# Patient Record
Sex: Female | Born: 1955
Health system: Southern US, Community
[De-identification: ages and names within clinical notes are randomized; demographics above are authoritative.]

## PROBLEM LIST (undated history)

## (undated) DIAGNOSIS — R011 Cardiac murmur, unspecified: Secondary | ICD-10-CM

## (undated) DIAGNOSIS — R1310 Dysphagia, unspecified: Secondary | ICD-10-CM

## (undated) DIAGNOSIS — I10 Essential (primary) hypertension: Secondary | ICD-10-CM

## (undated) DIAGNOSIS — Z8541 Personal history of malignant neoplasm of cervix uteri: Secondary | ICD-10-CM

## (undated) DIAGNOSIS — R21 Rash and other nonspecific skin eruption: Secondary | ICD-10-CM

## (undated) DIAGNOSIS — R7303 Prediabetes: Secondary | ICD-10-CM

## (undated) DIAGNOSIS — R600 Localized edema: Secondary | ICD-10-CM

## (undated) DIAGNOSIS — F419 Anxiety disorder, unspecified: Secondary | ICD-10-CM

## (undated) DIAGNOSIS — G629 Polyneuropathy, unspecified: Secondary | ICD-10-CM

## (undated) DIAGNOSIS — E119 Type 2 diabetes mellitus without complications: Secondary | ICD-10-CM

## (undated) DIAGNOSIS — R609 Edema, unspecified: Secondary | ICD-10-CM

## (undated) DIAGNOSIS — G609 Hereditary and idiopathic neuropathy, unspecified: Secondary | ICD-10-CM

## (undated) DIAGNOSIS — K2981 Duodenitis with bleeding: Secondary | ICD-10-CM

## (undated) DIAGNOSIS — Z8742 Personal history of other diseases of the female genital tract: Secondary | ICD-10-CM

## (undated) DIAGNOSIS — F410 Panic disorder [episodic paroxysmal anxiety] without agoraphobia: Secondary | ICD-10-CM

## (undated) DIAGNOSIS — E559 Vitamin D deficiency, unspecified: Secondary | ICD-10-CM

## (undated) DIAGNOSIS — R1013 Epigastric pain: Secondary | ICD-10-CM

## (undated) DIAGNOSIS — M8580 Other specified disorders of bone density and structure, unspecified site: Secondary | ICD-10-CM

## (undated) DIAGNOSIS — K219 Gastro-esophageal reflux disease without esophagitis: Secondary | ICD-10-CM

## (undated) DIAGNOSIS — M549 Dorsalgia, unspecified: Secondary | ICD-10-CM

## (undated) DIAGNOSIS — K2100 Gastro-esophageal reflux disease with esophagitis, without bleeding: Secondary | ICD-10-CM

## (undated) DIAGNOSIS — G4730 Sleep apnea, unspecified: Secondary | ICD-10-CM

## (undated) DIAGNOSIS — E669 Obesity, unspecified: Secondary | ICD-10-CM

## (undated) DIAGNOSIS — E785 Hyperlipidemia, unspecified: Secondary | ICD-10-CM

## (undated) DIAGNOSIS — K5730 Diverticulosis of large intestine without perforation or abscess without bleeding: Secondary | ICD-10-CM

## (undated) DIAGNOSIS — I340 Nonrheumatic mitral (valve) insufficiency: Secondary | ICD-10-CM

## (undated) HISTORY — DX: Gastro-esophageal reflux disease with esophagitis: K21.0

## (undated) HISTORY — DX: Localized edema: R60.0

## (undated) HISTORY — DX: Edema, unspecified: R60.9

## (undated) HISTORY — DX: Dysphagia, unspecified: R13.10

## (undated) HISTORY — DX: Type 2 diabetes mellitus without complications: E11.9

## (undated) HISTORY — DX: Hyperlipidemia, unspecified: E78.5

## (undated) HISTORY — DX: Rash and other nonspecific skin eruption: R21

## (undated) HISTORY — DX: Hereditary and idiopathic neuropathy, unspecified: G60.9

## (undated) HISTORY — DX: Gastro-esophageal reflux disease without esophagitis: K21.9

## (undated) HISTORY — DX: Nonrheumatic mitral (valve) insufficiency: I34.0

## (undated) HISTORY — DX: Anxiety disorder, unspecified: F41.9

## (undated) HISTORY — DX: Duodenitis with bleeding: K29.81

## (undated) HISTORY — DX: Diverticulosis of large intestine without perforation or abscess without bleeding: K57.30

## (undated) HISTORY — DX: Other specified disorders of bone density and structure, unspecified site: M85.80

## (undated) HISTORY — DX: Essential (primary) hypertension: I10

## (undated) HISTORY — DX: Prediabetes: R73.03

## (undated) HISTORY — DX: Panic disorder (episodic paroxysmal anxiety): F41.0

## (undated) HISTORY — DX: Sleep apnea, unspecified: G47.30

## (undated) HISTORY — DX: Vitamin D deficiency, unspecified: E55.9

## (undated) HISTORY — DX: Personal history of other diseases of the female genital tract: Z87.42

## (undated) HISTORY — DX: Epigastric pain: R10.13

## (undated) HISTORY — DX: Cardiac murmur, unspecified: R01.1

## (undated) HISTORY — DX: Gastro-esophageal reflux disease with esophagitis, without bleeding: K21.00

## (undated) HISTORY — DX: Polyneuropathy, unspecified: G62.9

## (undated) HISTORY — PX: BREAST CYST ASPIRATION: SHX578

## (undated) HISTORY — DX: Obesity, unspecified: E66.9

## (undated) HISTORY — PX: DILATION AND CURETTAGE OF UTERUS: SHX78

## (undated) HISTORY — DX: Dorsalgia, unspecified: M54.9

## (undated) HISTORY — DX: Personal history of malignant neoplasm of cervix uteri: Z85.41

## (undated) HISTORY — PX: ABDOMINAL HYSTERECTOMY: SHX81

---

## 1999-03-06 ENCOUNTER — Ambulatory Visit (HOSPITAL_COMMUNITY): Admission: RE | Admit: 1999-03-06 | Discharge: 1999-03-06 | Payer: Self-pay | Admitting: Obstetrics & Gynecology

## 1999-07-04 ENCOUNTER — Inpatient Hospital Stay (HOSPITAL_COMMUNITY): Admission: RE | Admit: 1999-07-04 | Discharge: 1999-07-06 | Payer: Self-pay | Admitting: Obstetrics & Gynecology

## 1999-08-14 ENCOUNTER — Encounter: Admission: RE | Admit: 1999-08-14 | Discharge: 1999-08-14 | Payer: Self-pay | Admitting: *Deleted

## 1999-08-14 ENCOUNTER — Encounter: Payer: Self-pay | Admitting: *Deleted

## 1999-09-21 HISTORY — PX: RIGHT OOPHORECTOMY: SHX2359

## 1999-10-19 ENCOUNTER — Other Ambulatory Visit: Admission: RE | Admit: 1999-10-19 | Discharge: 1999-10-19 | Payer: Self-pay | Admitting: Obstetrics and Gynecology

## 2000-05-16 ENCOUNTER — Encounter: Payer: Self-pay | Admitting: Obstetrics and Gynecology

## 2000-05-16 ENCOUNTER — Ambulatory Visit (HOSPITAL_COMMUNITY): Admission: RE | Admit: 2000-05-16 | Discharge: 2000-05-16 | Payer: Self-pay | Admitting: Obstetrics and Gynecology

## 2000-07-11 ENCOUNTER — Encounter: Payer: Self-pay | Admitting: Obstetrics and Gynecology

## 2000-07-11 ENCOUNTER — Ambulatory Visit (HOSPITAL_COMMUNITY): Admission: RE | Admit: 2000-07-11 | Discharge: 2000-07-11 | Payer: Self-pay | Admitting: Obstetrics and Gynecology

## 2000-10-20 ENCOUNTER — Encounter: Payer: Self-pay | Admitting: Obstetrics and Gynecology

## 2000-10-20 ENCOUNTER — Encounter: Admission: RE | Admit: 2000-10-20 | Discharge: 2000-10-20 | Payer: Self-pay | Admitting: Obstetrics and Gynecology

## 2000-11-09 ENCOUNTER — Ambulatory Visit (HOSPITAL_COMMUNITY): Admission: RE | Admit: 2000-11-09 | Discharge: 2000-11-09 | Payer: Self-pay | Admitting: Obstetrics and Gynecology

## 2000-11-09 ENCOUNTER — Encounter: Payer: Self-pay | Admitting: Obstetrics and Gynecology

## 2000-12-15 ENCOUNTER — Other Ambulatory Visit: Admission: RE | Admit: 2000-12-15 | Discharge: 2000-12-15 | Payer: Self-pay | Admitting: Obstetrics and Gynecology

## 2001-12-13 ENCOUNTER — Encounter: Payer: Self-pay | Admitting: Obstetrics and Gynecology

## 2001-12-13 ENCOUNTER — Encounter: Admission: RE | Admit: 2001-12-13 | Discharge: 2001-12-13 | Payer: Self-pay | Admitting: Obstetrics and Gynecology

## 2001-12-27 ENCOUNTER — Other Ambulatory Visit: Admission: RE | Admit: 2001-12-27 | Discharge: 2001-12-27 | Payer: Self-pay | Admitting: Obstetrics and Gynecology

## 2002-12-31 ENCOUNTER — Other Ambulatory Visit: Admission: RE | Admit: 2002-12-31 | Discharge: 2002-12-31 | Payer: Self-pay | Admitting: Obstetrics and Gynecology

## 2003-01-04 ENCOUNTER — Encounter: Admission: RE | Admit: 2003-01-04 | Discharge: 2003-01-04 | Payer: Self-pay | Admitting: Obstetrics and Gynecology

## 2003-01-04 ENCOUNTER — Encounter: Payer: Self-pay | Admitting: Obstetrics and Gynecology

## 2004-02-06 ENCOUNTER — Other Ambulatory Visit: Payer: Self-pay

## 2004-08-31 ENCOUNTER — Emergency Department: Payer: Self-pay | Admitting: Internal Medicine

## 2004-09-22 ENCOUNTER — Encounter: Payer: Self-pay | Admitting: Orthopedic Surgery

## 2004-10-21 ENCOUNTER — Encounter: Payer: Self-pay | Admitting: Orthopedic Surgery

## 2005-02-11 ENCOUNTER — Ambulatory Visit: Payer: Self-pay | Admitting: Internal Medicine

## 2005-02-23 ENCOUNTER — Ambulatory Visit: Payer: Self-pay | Admitting: Internal Medicine

## 2005-05-20 ENCOUNTER — Ambulatory Visit: Payer: Self-pay | Admitting: Unknown Physician Specialty

## 2005-12-04 ENCOUNTER — Emergency Department: Payer: Self-pay | Admitting: Emergency Medicine

## 2006-04-18 LAB — HM COLONOSCOPY

## 2006-05-31 ENCOUNTER — Ambulatory Visit: Payer: Self-pay | Admitting: Unknown Physician Specialty

## 2006-08-26 ENCOUNTER — Ambulatory Visit: Payer: Self-pay | Admitting: Internal Medicine

## 2006-09-15 ENCOUNTER — Ambulatory Visit: Payer: Self-pay | Admitting: Internal Medicine

## 2006-11-28 ENCOUNTER — Ambulatory Visit: Payer: Self-pay | Admitting: *Deleted

## 2007-06-14 ENCOUNTER — Ambulatory Visit: Payer: Self-pay | Admitting: Internal Medicine

## 2007-06-15 ENCOUNTER — Ambulatory Visit: Payer: Self-pay

## 2007-07-03 ENCOUNTER — Ambulatory Visit: Payer: Self-pay | Admitting: Internal Medicine

## 2007-08-05 ENCOUNTER — Ambulatory Visit: Payer: Self-pay | Admitting: Cardiovascular Disease

## 2008-01-23 ENCOUNTER — Ambulatory Visit: Payer: Self-pay | Admitting: Internal Medicine

## 2008-03-05 ENCOUNTER — Ambulatory Visit: Payer: Self-pay | Admitting: Internal Medicine

## 2008-06-09 ENCOUNTER — Emergency Department: Payer: Self-pay | Admitting: Unknown Physician Specialty

## 2008-07-03 ENCOUNTER — Ambulatory Visit: Payer: Self-pay | Admitting: Unknown Physician Specialty

## 2008-07-08 ENCOUNTER — Ambulatory Visit: Payer: Self-pay | Admitting: Internal Medicine

## 2008-08-02 ENCOUNTER — Ambulatory Visit: Payer: Self-pay | Admitting: Gastroenterology

## 2008-08-12 ENCOUNTER — Ambulatory Visit: Payer: Self-pay | Admitting: Internal Medicine

## 2008-08-22 ENCOUNTER — Ambulatory Visit: Payer: Self-pay | Admitting: Gastroenterology

## 2008-12-02 ENCOUNTER — Ambulatory Visit: Payer: Self-pay | Admitting: Internal Medicine

## 2009-05-27 ENCOUNTER — Ambulatory Visit: Payer: Self-pay | Admitting: Internal Medicine

## 2009-06-02 ENCOUNTER — Ambulatory Visit: Payer: Self-pay | Admitting: Internal Medicine

## 2009-06-20 ENCOUNTER — Ambulatory Visit: Payer: Self-pay

## 2009-12-24 ENCOUNTER — Ambulatory Visit: Payer: Self-pay | Admitting: Internal Medicine

## 2010-06-22 ENCOUNTER — Ambulatory Visit: Payer: Self-pay | Admitting: Unknown Physician Specialty

## 2011-05-07 ENCOUNTER — Encounter: Payer: Self-pay | Admitting: Internal Medicine

## 2011-05-17 ENCOUNTER — Encounter: Payer: Self-pay | Admitting: Internal Medicine

## 2011-05-17 ENCOUNTER — Ambulatory Visit (INDEPENDENT_AMBULATORY_CARE_PROVIDER_SITE_OTHER): Payer: PRIVATE HEALTH INSURANCE | Admitting: Internal Medicine

## 2011-05-17 VITALS — BP 104/80 | HR 70 | Temp 98.3°F | Resp 12 | Ht 62.5 in | Wt 196.8 lb

## 2011-05-17 DIAGNOSIS — R519 Headache, unspecified: Secondary | ICD-10-CM

## 2011-05-17 DIAGNOSIS — F5104 Psychophysiologic insomnia: Secondary | ICD-10-CM | POA: Insufficient documentation

## 2011-05-17 DIAGNOSIS — R51 Headache: Secondary | ICD-10-CM

## 2011-05-17 DIAGNOSIS — G4700 Insomnia, unspecified: Secondary | ICD-10-CM

## 2011-05-17 MED ORDER — DIAZEPAM 2 MG PO TABS: 5.0000 mg | ORAL_TABLET | Freq: Every day | ORAL | Status: DC

## 2011-05-17 NOTE — Patient Instructions (Signed)
Please take the valium 1 hour before bedtime.  Limit your after work responsibilities to three times weekly so you can rest and exercise. Call me in two weeks if your headaches do not improve.

## 2011-05-17 NOTE — Assessment & Plan Note (Signed)
Secondary to untreated anxiety. Recommended restricting after work activities to 3/week to allow time for massage therapy and exercise,  And trial of valium.

## 2011-05-17 NOTE — Assessment & Plan Note (Signed)
Agree with patient that, based on history, her headaches are tension headaches aggravated by lack of sleep.  Spent 30 minutes dicussing patient's current work and familial responisibilities and her self neglect and recommended trial of valium 1 hr before bedtime to manage muscle tension and insomnia.  If no improvement 2 weeks after resolution of insomnia, will obtain imaging of brain to rule out mass.

## 2011-05-17 NOTE — Progress Notes (Signed)
Subjective:    Patient ID: Sheri [name], female    DOB: [birth year], 55 y.o.   MRN: [id number]  HPI Sheri [name] is a 55 yo female with a history of hypertension and anxiety who presents with new onset chronic daily headaches. She attributes her symptoms to tension aggravated by lack of sleep due ot increased caregiver responsibilities. 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  Her uncle died last Aug 29, 2023 in Hospice care after a slow progressive decline in health .  She has not been resting, not exercising, working full time,  Taking aunt daily to see husband.  She lies awake until 3 am. Wakes up with clenched teeth, clenched muscles. Having excessive trouble accepting assistance from others due to fear of something going wrong. She had a massage x 2 for back and neck pain which helped for a short period of time.  Headaches are bilateral temporal/frontal.  No vision changes, fevers or myalgias.    Review of Systems  Constitutional: Positive for activity change, fatigue and unexpected weight change. Negative for fever and chills.  HENT: Negative for hearing loss, ear pain, nosebleeds, congestion, sore throat, facial swelling, rhinorrhea, sneezing, mouth sores, trouble swallowing, neck pain, neck stiffness, voice change, postnasal drip, sinus pressure, tinnitus and ear discharge.   Eyes: Negative for pain, discharge, redness and visual disturbance.  Respiratory: Negative for cough, chest tightness, shortness of breath, wheezing and stridor.   Cardiovascular: Negative for chest pain, palpitations and leg swelling.  Musculoskeletal: Negative for myalgias and arthralgias.  Skin: Negative for color change and rash.  Neurological: Positive for headaches. Negative for dizziness, weakness and light-headedness.  Hematological: Negative for adenopathy.  Psychiatric/Behavioral: Positive for sleep disturbance. The patient is  nervous/anxious.        Objective:   Physical Exam  Constitutional: She is oriented to person, place, and time. She appears well-developed and well-nourished.  HENT:  Head: Normocephalic.  Eyes: EOM are normal. Pupils are equal, round, and reactive to light.  Neck: Normal range of motion. Neck supple. No thyromegaly present.  Cardiovascular: Normal rate, regular rhythm and normal heart sounds.   Pulmonary/Chest: Effort normal and breath sounds normal.  Lymphadenopathy:    She has no cervical adenopathy.  Neurological: She is alert and oriented to person, place, and time. She has normal reflexes.  Psychiatric: Her mood appears anxious.          Assessment & Plan:

## 2011-05-19 ENCOUNTER — Telehealth: Payer: Self-pay | Admitting: Internal Medicine

## 2011-05-19 ENCOUNTER — Other Ambulatory Visit: Payer: Self-pay | Admitting: Internal Medicine

## 2011-05-19 ENCOUNTER — Telehealth: Payer: Self-pay | Admitting: *Deleted

## 2011-05-19 MED ORDER — ZOLPIDEM TARTRATE 10 MG PO TABS: 10.0000 mg | ORAL_TABLET | Freq: Every evening | ORAL | Status: DC | PRN

## 2011-05-19 MED ORDER — LISINOPRIL-HYDROCHLOROTHIAZIDE 20-25 MG PO TABS: 1.0000 | ORAL_TABLET | Freq: Every day | ORAL | Status: DC

## 2011-05-19 NOTE — Telephone Encounter (Signed)
Took diazpam  Last night  It keep her up all night pt feels like skin was crawling/rbh

## 2011-05-19 NOTE — Telephone Encounter (Signed)
Spoke with patient, and sent note to Dr. Darrick Huntsman.

## 2011-05-19 NOTE — Telephone Encounter (Signed)
Yes we can try  Zolpidem 10 mg one tablet at bedtime as needed for insomnia  #30 2 refills

## 2011-05-19 NOTE — Telephone Encounter (Signed)
Patient took the diazepam for the first time last night and she never went to sleep. She said that she felt like her skin was crawling, and felt really anxious. She is asking if she can try something else.

## 2011-05-19 NOTE — Telephone Encounter (Signed)
Addended by: Jobie Quaker on: 05/19/2011 05:37 PM   Modules accepted: Orders

## 2011-05-19 NOTE — Telephone Encounter (Signed)
Patient notified- Rx sent to pharmacy. 

## 2011-05-20 LAB — HM MAMMOGRAPHY: HM Mammogram: NORMAL

## 2011-07-07 ENCOUNTER — Ambulatory Visit: Payer: Self-pay | Admitting: Unknown Physician Specialty

## 2011-07-07 ENCOUNTER — Telehealth: Payer: Self-pay | Admitting: *Deleted

## 2011-07-07 NOTE — Telephone Encounter (Signed)
Patient notified

## 2011-07-07 NOTE — Telephone Encounter (Signed)
Message copied by Jobie Quaker on Wed Jul 07, 2011  3:55 PM ------      Message from: Duncan Dull      Created: Thu Jul 01, 2011  1:34 PM      Regarding: labs       [facility].  a1c was great at 5.6 ,  LDL 132,   Everything else normal.  Will discuss at next visit.

## 2011-08-09 ENCOUNTER — Other Ambulatory Visit: Payer: Self-pay | Admitting: Internal Medicine

## 2011-08-09 MED ORDER — ZOLPIDEM TARTRATE 10 MG PO TABS: 10.0000 mg | ORAL_TABLET | Freq: Every evening | ORAL | Status: DC | PRN

## 2011-09-06 ENCOUNTER — Other Ambulatory Visit: Payer: Self-pay | Admitting: *Deleted

## 2011-09-06 MED ORDER — FUROSEMIDE 20 MG PO TABS: 20.0000 mg | ORAL_TABLET | Freq: Every day | ORAL | Status: DC

## 2011-10-12 ENCOUNTER — Other Ambulatory Visit: Payer: Self-pay | Admitting: *Deleted

## 2011-10-12 NOTE — Telephone Encounter (Signed)
Faxed request from [facility], last filled 09/08/11.

## 2011-10-14 MED ORDER — ALPRAZOLAM 0.25 MG PO TABS: ORAL_TABLET | ORAL | Status: DC

## 2011-10-14 NOTE — Telephone Encounter (Signed)
Called to [facility]. 

## 2011-10-29 ENCOUNTER — Telehealth: Payer: Self-pay | Admitting: *Deleted

## 2011-10-29 DIAGNOSIS — T753XXA Motion sickness, initial encounter: Secondary | ICD-10-CM | POA: Insufficient documentation

## 2011-10-29 MED ORDER — SCOPOLAMINE 1 MG/3DAYS TD PT72: 1.0000 | MEDICATED_PATCH | TRANSDERMAL | Status: DC

## 2011-10-29 NOTE — Telephone Encounter (Signed)
Pt is going on a trip and is asking for motion sickness patches to be sent to [facility] pharmacy.  She says one box should be enough.

## 2011-10-29 NOTE — Telephone Encounter (Signed)
Sent to [facility]

## 2011-12-13 ENCOUNTER — Other Ambulatory Visit: Payer: Self-pay | Admitting: Internal Medicine

## 2011-12-14 ENCOUNTER — Other Ambulatory Visit: Payer: Self-pay | Admitting: *Deleted

## 2011-12-14 MED ORDER — ZOLPIDEM TARTRATE 10 MG PO TABS: 10.0000 mg | ORAL_TABLET | Freq: Every evening | ORAL | Status: DC | PRN

## 2011-12-15 MED ORDER — ZOLPIDEM TARTRATE 10 MG PO TABS: 10.0000 mg | ORAL_TABLET | Freq: Every evening | ORAL | Status: DC | PRN

## 2012-02-09 ENCOUNTER — Other Ambulatory Visit: Payer: Self-pay | Admitting: Internal Medicine

## 2012-02-09 MED ORDER — LISINOPRIL-HYDROCHLOROTHIAZIDE 20-25 MG PO TABS: 1.0000 | ORAL_TABLET | Freq: Every day | ORAL | Status: DC

## 2012-04-07 ENCOUNTER — Other Ambulatory Visit: Payer: Self-pay | Admitting: *Deleted

## 2012-04-07 MED ORDER — ALPRAZOLAM 0.25 MG PO TABS: ORAL_TABLET | ORAL | Status: DC

## 2012-04-07 MED ORDER — ZOLPIDEM TARTRATE 10 MG PO TABS: 10.0000 mg | ORAL_TABLET | Freq: Every evening | ORAL | Status: DC | PRN

## 2012-04-07 NOTE — Telephone Encounter (Signed)
Rx called into [facility], pt informed she would have to make appt for additional refills. Pt transferred to scheduler to set up appointment.

## 2012-04-07 NOTE — Telephone Encounter (Signed)
Ok to refill both  For a 30 day supply with 1 refill but will need office visit priro to more (last seen august 2012)

## 2012-04-07 NOTE — Telephone Encounter (Signed)
R'cd fax from [facility] for refill of Alprazolam-last written 10/12/2011 #60 with 4 refills and Zolpidem 10 mg-last phoned in 12/14/2011 #30 with 1 refill-please advise.

## 2012-04-18 ENCOUNTER — Other Ambulatory Visit: Payer: Self-pay | Admitting: Internal Medicine

## 2012-04-18 ENCOUNTER — Encounter: Payer: Self-pay | Admitting: Internal Medicine

## 2012-04-18 ENCOUNTER — Ambulatory Visit (INDEPENDENT_AMBULATORY_CARE_PROVIDER_SITE_OTHER): Payer: PRIVATE HEALTH INSURANCE | Admitting: Internal Medicine

## 2012-04-18 VITALS — BP 116/86 | HR 82 | Temp 98.4°F | Resp 16 | Wt 213.5 lb

## 2012-04-18 DIAGNOSIS — R079 Chest pain, unspecified: Secondary | ICD-10-CM

## 2012-04-18 DIAGNOSIS — E1140 Type 2 diabetes mellitus with diabetic neuropathy, unspecified: Secondary | ICD-10-CM | POA: Insufficient documentation

## 2012-04-18 DIAGNOSIS — E119 Type 2 diabetes mellitus without complications: Secondary | ICD-10-CM

## 2012-04-18 LAB — COMPREHENSIVE METABOLIC PANEL
Albumin: 4 g/dL (ref 3.4–5.0)
Alkaline Phosphatase: 55 U/L (ref 50–136)
Bilirubin,Total: 0.8 mg/dL (ref 0.2–1.0)
Calcium, Total: 8.9 mg/dL (ref 8.5–10.1)
Co2: 23 mmol/L (ref 21–32)
Creatinine: 0.63 mg/dL (ref 0.60–1.30)
EGFR (African American): >60
EGFR (Non-African Amer.): >60
Glucose: 105 mg/dL — ABNORMAL HIGH (ref 65–99)
Osmolality: 278 (ref 275–301)
SGPT (ALT): 26 U/L
Sodium: 139 mmol/L (ref 136–145)

## 2012-04-18 LAB — CK-MB: CK-MB: 0.9 ng/mL (ref 0.5–3.6)

## 2012-04-18 MED ORDER — ZOLPIDEM TARTRATE 10 MG PO TABS: 10.0000 mg | ORAL_TABLET | Freq: Every evening | ORAL | Status: DC | PRN

## 2012-04-18 MED ORDER — ALPRAZOLAM 0.25 MG PO TABS: ORAL_TABLET | ORAL | Status: DC

## 2012-04-18 NOTE — Progress Notes (Signed)
Patient ID: Sheri [name], female   DOB: [birth year], 56 y.o.   MRN: [id number]  Patient Active Problem List  Diagnosis  . Headache, chronic daily  . Insomnia, psychophysiological  . Motion sickness  . Diabetes mellitus type 2, diet-controlled  . Chest pain at rest    Subjective:  CC:   Chief Complaint  Patient presents with  . Follow-up  . Medication Refill    HPI:   Sheri [name] a 56 y.o. female who presents Recurrent chest pressure radiates to small of back. Occurs at rest while at work, lasts an hour sometimes,  With squeezing episodes lasting  3-4 minutes at a time.  Feels like pressure,  And in the area between the shoulder blades it feels like a fist.  Last stress test was done over 5 years ago,  By [facility].  Only MR noted. Has frequent palpitations which occur with the chest pain.   No prior Holter monitor. Father had CAD , had an AMI during CABG.  Warned to have cardiac workup by [facility] [employer] manager.  Friend dropped dead of AMI recently, which has increased her concern about her own symptoms.  Reports lots of work related stress, complicated by responsibilities of home care of ailing mother along with resonsibilities oftrying to get her aunt's house sold.     Past Medical History  Diagnosis Date  . Hypertension   . Unspecified hereditary and idiopathic peripheral neuropathy   . Diabetes mellitus, type 2   . Osteopenia   . Unspecified sleep apnea     sleep study- [facility]  . Reflux esophagitis   . Diverticulosis of colon without hemorrhage   . Rash and other nonspecific skin eruption   . Personal history of malignant neoplasm of cervix uteri     CIN-3 1989, normal since, gets pap smears annually  . Personal history of other genital system and obstetric disorders   . Abdominal pain, epigastric   . Panic disorder without agoraphobia   . Duodenitis with hemorrhage   . Edema   . Obesity, unspecified   . Hyperlipidemia     Past Surgical History  Procedure Date    . Right oophorectomy 2001    menopause, endometriosis s/p.  HRT  since- has not tolerated previous attempts to wean  . Breast cyst aspiration     right , benign         The following portions of the patient's history were reviewed and updated as appropriate: Allergies, current medications, and problem list.    Review of Systems:   12 Pt  review of systems was negative except those addressed in the HPI,     History   Social History  . Marital Status: Married    Spouse Name: N/A    Number of Children: N/A  . Years of Education: N/A   Occupational History  . 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 Smoking status: Never Smoker   . Smokeless tobacco: Never Used  . Alcohol Use: Yes     rare  . Drug Use: No  . Sexually Active: Not on file   Other Topics Concern  . Not on file   Social History Narrative   Lives with spouse but the two are emotionally estranged due to spouse's infidelity.    Objective:  BP 116/86  Pulse 82  Temp 98.4 F (36.9 C) (Oral)  Resp 16  Wt 213 lb 8 oz (96.843 kg)  SpO2  95%  General appearance: alert, cooperative and appears stated age Neck: no adenopathy, no carotid bruit, supple, symmetrical, trachea midline and thyroid not enlarged, symmetric, no tenderness/mass/nodules Back: symmetric, no curvature. ROM normal. No CVA tenderness. Lungs: clear to auscultation bilaterally Heart: regular rate and rhythm, S1, S2 normal, no murmur, click, rub or gallop Abdomen: soft, non-tender; bowel sounds normal; no masses,  no organomegaly Pulses: 2+ and symmetric Skin: Skin color, texture, turgor normal. No rashes or lesions Lymph nodes: Cervical, supraclavicular, and axillary nodes normal. Foot exam:  Nails are well trimmed,  No callouses,  Sensation intact to microfilament  Assessment and Plan:  Diabetes mellitus type 2, diet-controlled [facility]  Discussed low GI diet.  Up  to date with eye exams. Other labs needed to assess lipid status.      Chest pain at rest Atypical, but patient does not exercise and has multiple CRFS including diabetes and FH.  Will refer to cardiology for evaluation.  EKG done today was normal and cardiac enzymes pending    Updated Medication List Outpatient Encounter Prescriptions as of 04/18/2012  Medication Sig Dispense Refill  . ALPRAZolam (XANAX) 0.25 MG tablet Take one tablet by mouth two times a day.  60 tablet  5  . estrogen, conjugated,-medroxyprogesterone (PREMPRO) 0.3-1.5 MG per tablet Take 1 tablet by mouth daily.        . furosemide (LASIX) 20 MG tablet Take 1 tablet (20 mg total) by mouth daily. Take one daily as needed for fluid retention  30 tablet  6  . glucose blood (ONE TOUCH TEST STRIPS) test strip Use as instructed       . lisinopril-hydrochlorothiazide (PRINZIDE,ZESTORETIC) 20-25 MG per tablet Take 1 tablet by mouth daily.  30 tablet  6  . zolpidem (AMBIEN) 10 MG tablet Take 1 tablet (10 mg total) by mouth at bedtime as needed for sleep.  30 tablet  5  . DISCONTD: ALPRAZolam (XANAX) 0.25 MG tablet Take one tablet by mouth two times a day.  60 tablet  1  . DISCONTD: zolpidem (AMBIEN) 10 MG tablet Take 1 tablet (10 mg total) by mouth at bedtime as needed for sleep.  30 tablet  1  . DISCONTD: diazepam (VALIUM) 2 MG tablet Take 2.5 tablets (5 mg total) by mouth at bedtime.  30 tablet  2  . DISCONTD: scopolamine (TRANSDERM-SCOP) 1.5 MG Place 1 patch (1.5 mg total) onto the skin every 3 (three) days.  10 patch  12  . DISCONTD: zolpidem (AMBIEN) 10 MG tablet Take 1 tablet (10 mg total) by mouth at bedtime as needed for sleep.  30 tablet  2  . DISCONTD: zolpidem (AMBIEN) 10 MG tablet Take 1 tablet (10 mg total) by mouth at bedtime as needed for sleep.  30 tablet  3  . DISCONTD: zolpidem (AMBIEN) 10 MG tablet Take 1 tablet (10 mg total) by mouth at bedtime as needed for sleep.  30 tablet  3  . DISCONTD: zolpidem (AMBIEN) 10 MG  tablet Take 1 tablet (10 mg total) by mouth at bedtime as needed for sleep.  30 tablet  1     Orders Placed This Encounter  Procedures  . HM MAMMOGRAPHY  . HM PAP SMEAR  . Troponin I  . Comprehensive metabolic panel  . CK Total (and CKMB)  . Ambulatory referral to Cardiology  . EKG 12-Lead  . HM COLONOSCOPY    No Follow-up on file.

## 2012-04-18 NOTE — Patient Instructions (Addendum)
We are referring you to Dr. Kirke Corin for stress testing   Consider a Low Glycemic Index Diet and eating 6 smaller meals daily .  This frequent feeding stimulates your metabolism and the lower glycemic index foods will lower your blood sugars:   This is an example of my daily  "Low GI"  Diet:  All of the foods can be found at grocery stores and in bulk at [employer]  club   7 AM Breakfast:  Low carbohydrate Protein  Shakes (I recommend the EAS AdvantEdge "Carb Control" shakes  Or the low carb shakes by Atkins.   Both are available everywhere:  In  cases at [employer]Js  Or in 4 packs at grocery stores and pharmacies  2.5 carbs  (Alternative is  a toasted Arnold's Sandwhich Thin w/ peanut butter, a "Bagel Thin" with cream cheese and salmon) or  a scrambled egg burrito made with a low carb tortilla .  Avoid cereal and bananas, oatmeal too unless the old fashioned kind that takes 30-40 minutes to prepare.  the rest is overly processed, has minimal fiber, and loaded with carbohydrates!   10 AM: Protein bar by Atkins (the snack size, under 200 cal.  There are many varieties , available widely again or in bulk in limited varieties at [employer]Js)  Other so called "protein bars" tend to be loaded with carbohydrates.  Remember, in food advertising, the word "energy" is synonymous for " carbohydrate."  Lunch: sandwich of [place], (or any lunchmeat or canned tuna), fresh avocado and cheese on a lower carbohydrate pita bread, flatbread, or tortilla . Ok to use mayonnaise. The bread is the only source or carbohydrate that can be decreased (Joseph's makes a pita bread and a flat bread  Are 50 cal and 4 net carbs ; Toufayan makes a low carb flatbread 100 cal and 9 net carbs  and  Mission makes a low carb whole wheat tortilla  210 cal and 6 net carbs)  3 PM:  Mid day :  Another proteintttt bThe brear,  Or a  cheese stick (100 cal, 0 carbs),  Or 1 ounce of  almonds, walnuts, pistachios, pecans, peanuts,  Macadamia nuts. Or a Dannon light n Fit  greek yogurt, 80 cal 8 net carbs . Avoid "granola"; the dried cranberries and raisins are loaded with carbohydrates.    6 PM  Dinner:  "mean and green:"  Meat/chicken/fish or a high protein legume; , with a green salad, and a low GI  Veggie (broccoli, cauliflower, green beans, spinach, brussel sprouts. Lima beans) : Avoid "Low fat dressings, Reyne Dumas and [address]! They are loaded with sugar! Instead use ranch, vinagrette,  Blue cheese, etc  9 PM snack : Breyer's "low carb" fudgsicle or  ice cream bar (Carb Smart line), or  Weight Watcher's ice cream bar , or anouther "no sugar added" ice cream; or another protein shake or a serving of fresh fruit with whipped cream (Avoid bananas, pineapple, grapes  and watermelon on a regular basis because they are high in sugar)   Remember that snack Substitutions should be less than 15 to 20 carbs  Per serving. Remember to subtract fiber grams to get the "net carbs."

## 2012-04-19 DIAGNOSIS — R079 Chest pain, unspecified: Secondary | ICD-10-CM | POA: Insufficient documentation

## 2012-04-19 NOTE — Assessment & Plan Note (Signed)
Atypical, but patient does not exercise and has multiple CRFS including diabetes and FH.  Will refer to cardiology for evaluation.  EKG done today was normal and cardiac enzymes pending

## 2012-04-19 NOTE — Assessment & Plan Note (Signed)
[facility].  Discussed low GI diet.  Up to date with eye exams. Other labs needed to assess lipid status.

## 2012-04-21 ENCOUNTER — Ambulatory Visit: Payer: PRIVATE HEALTH INSURANCE | Admitting: Cardiovascular Disease

## 2012-04-21 ENCOUNTER — Telehealth: Payer: Self-pay | Admitting: Internal Medicine

## 2012-04-21 NOTE — Telephone Encounter (Signed)
Left message asking patient to call back

## 2012-04-21 NOTE — Telephone Encounter (Signed)
Patient notified

## 2012-04-21 NOTE — Telephone Encounter (Signed)
And was were normal cholesterol was borderline. Triglycerides were elevated at 215 and LDL is 123. Low glycemic index diet should help all of these issues. A1c was fine at 5.5.

## 2012-04-24 ENCOUNTER — Encounter: Payer: Self-pay | Admitting: Cardiovascular Disease

## 2012-04-24 ENCOUNTER — Ambulatory Visit (INDEPENDENT_AMBULATORY_CARE_PROVIDER_SITE_OTHER): Payer: PRIVATE HEALTH INSURANCE | Admitting: Cardiovascular Disease

## 2012-04-24 VITALS — BP 130/90 | HR 119 | Ht 63.0 in | Wt 212.0 lb

## 2012-04-24 DIAGNOSIS — R0600 Dyspnea, unspecified: Secondary | ICD-10-CM

## 2012-04-24 DIAGNOSIS — R000 Tachycardia, unspecified: Secondary | ICD-10-CM

## 2012-04-24 DIAGNOSIS — I498 Other specified cardiac arrhythmias: Secondary | ICD-10-CM

## 2012-04-24 DIAGNOSIS — R0609 Other forms of dyspnea: Secondary | ICD-10-CM

## 2012-04-24 DIAGNOSIS — R079 Chest pain, unspecified: Secondary | ICD-10-CM

## 2012-04-24 NOTE — Assessment & Plan Note (Signed)
Her chest pain is overall atypical and could be related to anxiety. Baseline ECG shows sinus tachycardia with nonspecific T wave changes. She has multiple risk factors for coronary artery disease and thus I recommend evaluation with a treadmill stress test. Her chest pain has been nonexertional but she does report significant exertional dyspnea. This could be due to her weight gain and physical deconditioning. I will obtain an echocardiogram To evaluate LV systolic function and pulmonary pressure.

## 2012-04-24 NOTE — Progress Notes (Signed)
HPI  Sheri [name] a 56 y.o. female who was referred by Dr. Darrick Huntsman for evaluation of chest pain. This started a few months ago. She describes substernal chest tightness radiating to her back which happens at rest and is mostly triggered when she is under stress and anxiety. These episodes last anywhere from a few minutes to an hour. She was able to lose weight in the past but had regained significant weight over the last year. She is trying to go back on Weight Watchers. She reports previous history of chest pain which was evaluated by a nuclear stress test in 2008 and showed no evidence of ischemia. She has multiple chronic medical conditions that include hypertension, hyperlipidemia as well as borderline diabetes. 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 She does have family history of coronary artery disease but not prematurely. She does complain of palpitations as well as exertional dyspnea. She feels that her heart is going fast almost all the time.  No Known Allergies   Current Outpatient Prescriptions on File Prior to Visit  Medication Sig Dispense Refill  . ALPRAZolam (XANAX) 0.25 MG tablet Take one tablet by mouth two times a day.  60 tablet  5  . estrogen, conjugated,-medroxyprogesterone (PREMPRO) 0.3-1.5 MG per tablet Take 1 tablet by mouth daily.        . furosemide (LASIX) 20 MG tablet Take 1 tablet (20 mg total) by mouth daily. Take one daily as needed for fluid retention  30 tablet  6  . glucose blood (ONE TOUCH TEST STRIPS) test strip Use as instructed       . lisinopril-hydrochlorothiazide (PRINZIDE,ZESTORETIC) 20-25 MG per tablet Take 1 tablet by mouth daily.  30 tablet  6  . zolpidem (AMBIEN) 10 MG tablet Take 1 tablet (10 mg total) by mouth at bedtime as needed for sleep.  30 tablet  5     Past Medical History  Diagnosis Date  . Hypertension   . Unspecified hereditary and idiopathic peripheral neuropathy   . Diabetes mellitus, type 2   . Osteopenia   . Unspecified  sleep apnea     sleep study- [facility]  . Reflux esophagitis   . Diverticulosis of colon without hemorrhage   . Rash and other nonspecific skin eruption   . Personal history of malignant neoplasm of cervix uteri     CIN-3 1989, normal since, gets pap smears annually  . Personal history of other genital system and obstetric disorders   . Abdominal pain, epigastric   . Panic disorder without agoraphobia   . Duodenitis with hemorrhage   . Edema   . Obesity, unspecified   . Hyperlipidemia   . Heart murmur   . Mitral regurgitation      Past Surgical History  Procedure Date  . Right oophorectomy 2001    menopause, endometriosis s/p.  HRT  since- has not tolerated previous attempts to wean  . Breast cyst aspiration     right , benign     Family History  Problem Relation Age of Onset  . Osteopenia Mother   . Hypothyroidism Mother   . Diabetes Father   . Alzheimer's disease Father   . Coronary artery disease Father   . Cancer Neg Hx     breast, ovarian, colon     History   Social History  . Marital Status: Married    Spouse Name: N/A    Number of Children: N/A  . Years of Education: N/A   Occupational History  .  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 Smoking status: Never Smoker   . Smokeless tobacco: Never Used  . Alcohol Use: Yes     rare  . Drug Use: No  . Sexually Active: Not on file   Other Topics Concern  . Not on file   Social History Narrative   Lives with spouse but the two are emotionally estranged due to spouse's infidelity.     ROS Constitutional: Negative for fever, chills, diaphoresis, activity change, appetite change.  HENT: Negative for hearing loss, nosebleeds, congestion, sore throat, facial swelling, drooling, trouble swallowing, neck pain, voice change, sinus pressure and tinnitus.  Eyes: Negative for photophobia, pain, discharge and visual disturbance.  Respiratory: Negative for apnea, cough and wheezing.    Cardiovascular: Negative for  leg swelling.  Gastrointestinal: Negative for nausea, vomiting, abdominal pain, diarrhea, constipation, blood in stool and abdominal distention.  Genitourinary: Negative for dysuria, urgency, frequency, hematuria and decreased urine volume.  Musculoskeletal: Negative for myalgias, back pain, joint swelling, arthralgias and gait problem.  Skin: Negative for color change, pallor, rash and wound.  Neurological: Negative for dizziness, tremors, seizures, syncope, speech difficulty, weakness, light-headedness, numbness and headaches.  Psychiatric/Behavioral: Negative for suicidal ideas, hallucinations, behavioral problems and agitation. The patient is  nervous/anxious.     PHYSICAL EXAM   BP 130/90  Pulse 119  Ht 5\' 3"  (1.6 m)  Wt 212 lb (96.163 kg)  BMI 37.55 kg/m2 Constitutional: She is oriented to person, place, and time. She appears well-developed and well-nourished. No distress.  HENT: No nasal discharge.  Head: Normocephalic and atraumatic.  Eyes: Pupils are equal and round. Right eye exhibits no discharge. Left eye exhibits no discharge.  Neck: Normal range of motion. Neck supple. No JVD present. No thyromegaly present.  Cardiovascular: Normal rate, regular rhythm, normal heart sounds. Exam reveals no gallop and no friction rub. No murmur heard.  Pulmonary/Chest: Effort normal and breath sounds normal. No stridor. No respiratory distress. She has no wheezes. She has no rales. She exhibits no tenderness.  Abdominal: Soft. Bowel sounds are normal. She exhibits no distension. There is no tenderness. There is no rebound and no guarding.  Musculoskeletal: Normal range of motion. She exhibits no edema and no tenderness.  Neurological: She is alert and oriented to person, place, and time. Coordination normal.  Skin: Skin is warm and dry. No rash noted. She is not diaphoretic. No erythema. No pallor.  Psychiatric: She has a normal mood and affect. Her behavior  is normal. Judgment and thought content normal.     EKG: Sinus  Tachycardia  Low voltage in precordial leads.   -Prominent R(V1) -nonspecific.   -  Diffuse nonspecific T-abnormality.    ASSESSMENT AND PLAN

## 2012-04-24 NOTE — Assessment & Plan Note (Signed)
She is mildly tachycardic today but seems to be anxious. This might be contributing to some of her symptoms of palpitations and chest discomfort. I will consider treatment with a beta blocker for symptomatic relief. She probably also needs to have routine labs if not already done including thyroid function.

## 2012-04-24 NOTE — Patient Instructions (Addendum)
Your physician has requested that you have an echocardiogram. Echocardiography is a painless test that uses sound waves to create images of your heart. It provides your doctor with information about the size and shape of your heart and how well your heart's chambers and valves are working. This procedure takes approximately one hour. There are no restrictions for this procedure.  Your physician has requested that you have an exercise tolerance test. For further information please visit [email or web address]. Please also follow instruction sheet, as given.  Follow up after tests.  

## 2012-06-07 ENCOUNTER — Other Ambulatory Visit: Payer: Self-pay | Admitting: Internal Medicine

## 2012-06-08 ENCOUNTER — Other Ambulatory Visit: Payer: Self-pay | Admitting: Internal Medicine

## 2012-06-08 NOTE — Telephone Encounter (Signed)
Cell [phone] Pt called left message on traige line  Pt need refill on xanax and zolpiden Pt is [facility]

## 2012-06-09 MED ORDER — ZOLPIDEM TARTRATE 10 MG PO TABS: 10.0000 mg | ORAL_TABLET | Freq: Every evening | ORAL | Status: DC | PRN

## 2012-06-09 MED ORDER — ALPRAZOLAM 0.25 MG PO TABS: ORAL_TABLET | ORAL | Status: DC

## 2012-06-09 NOTE — Telephone Encounter (Signed)
Rx's called in . 

## 2012-06-20 ENCOUNTER — Telehealth: Payer: Self-pay | Admitting: Internal Medicine

## 2012-06-20 NOTE — Telephone Encounter (Signed)
I received her hemoglobin A1c from [facility] it was excellent at 5.6.

## 2012-06-21 NOTE — Telephone Encounter (Signed)
Patient notified

## 2012-06-28 LAB — HM MAMMOGRAPHY: HM Mammogram: NORMAL

## 2012-07-18 ENCOUNTER — Ambulatory Visit: Payer: Self-pay | Admitting: Internal Medicine

## 2012-07-28 ENCOUNTER — Encounter: Payer: Self-pay | Admitting: Internal Medicine

## 2012-10-03 ENCOUNTER — Other Ambulatory Visit: Payer: Self-pay | Admitting: Internal Medicine

## 2012-10-03 MED ORDER — LISINOPRIL-HYDROCHLOROTHIAZIDE 20-25 MG PO TABS: 1.0000 | ORAL_TABLET | Freq: Every day | ORAL | Status: DC

## 2012-10-03 NOTE — Telephone Encounter (Signed)
lisinopril-hydrochlorothiazide (PRINZIDE,ZESTORETIC) 20-25 MG per tablet  # 30

## 2012-10-03 NOTE — Telephone Encounter (Signed)
Med filled.  

## 2012-12-04 ENCOUNTER — Other Ambulatory Visit: Payer: Self-pay | Admitting: *Deleted

## 2012-12-04 MED ORDER — ALPRAZOLAM 0.25 MG PO TABS: ORAL_TABLET | ORAL | Status: DC

## 2012-12-04 MED ORDER — ZOLPIDEM TARTRATE 10 MG PO TABS: 10.0000 mg | ORAL_TABLET | Freq: Every evening | ORAL | Status: DC | PRN

## 2013-01-08 ENCOUNTER — Encounter: Payer: Self-pay | Admitting: *Deleted

## 2013-01-08 ENCOUNTER — Other Ambulatory Visit: Payer: Self-pay | Admitting: *Deleted

## 2013-01-08 MED ORDER — FUROSEMIDE 20 MG PO TABS: 20.0000 mg | ORAL_TABLET | Freq: Every day | ORAL | Status: DC

## 2013-05-03 ENCOUNTER — Other Ambulatory Visit: Payer: Self-pay | Admitting: *Deleted

## 2013-05-04 NOTE — Telephone Encounter (Signed)
Patient has not had OV since 04/24/12 please advise as to refill. Tried to call patient no return call as to date.

## 2013-05-06 NOTE — Telephone Encounter (Signed)
Since we cannot get in touch with her, i have denied the refill so that she will call.

## 2013-05-07 ENCOUNTER — Telehealth: Payer: Self-pay | Admitting: Internal Medicine

## 2013-05-07 MED ORDER — LISINOPRIL-HYDROCHLOROTHIAZIDE 20-25 MG PO TABS: 1.0000 | ORAL_TABLET | Freq: Every day | ORAL | Status: DC

## 2013-05-07 NOTE — Telephone Encounter (Signed)
Refill sent for thirty days notified patient needs OV for further refills.

## 2013-05-07 NOTE — Telephone Encounter (Signed)
Pt states she is completely out of her lisinopril.  Pt states her pharmacy faxed the refill request on Thursday 8/14.  Pt needing her medication refill @ [facility] employee pharmacy.

## 2013-05-11 ENCOUNTER — Encounter: Payer: Self-pay | Admitting: *Deleted

## 2013-05-11 ENCOUNTER — Encounter: Payer: Self-pay | Admitting: Internal Medicine

## 2013-05-11 ENCOUNTER — Ambulatory Visit (INDEPENDENT_AMBULATORY_CARE_PROVIDER_SITE_OTHER): Payer: 59 | Admitting: Internal Medicine

## 2013-05-11 VITALS — BP 128/94 | HR 97 | Temp 98.3°F | Resp 14 | Wt 214.5 lb

## 2013-05-11 DIAGNOSIS — F329 Major depressive disorder, single episode, unspecified: Secondary | ICD-10-CM

## 2013-05-11 DIAGNOSIS — R7309 Other abnormal glucose: Secondary | ICD-10-CM

## 2013-05-11 DIAGNOSIS — K2100 Gastro-esophageal reflux disease with esophagitis, without bleeding: Secondary | ICD-10-CM

## 2013-05-11 DIAGNOSIS — R079 Chest pain, unspecified: Secondary | ICD-10-CM

## 2013-05-11 DIAGNOSIS — R739 Hyperglycemia, unspecified: Secondary | ICD-10-CM

## 2013-05-11 DIAGNOSIS — E119 Type 2 diabetes mellitus without complications: Secondary | ICD-10-CM

## 2013-05-11 DIAGNOSIS — F411 Generalized anxiety disorder: Secondary | ICD-10-CM

## 2013-05-11 DIAGNOSIS — R5381 Other malaise: Secondary | ICD-10-CM

## 2013-05-11 DIAGNOSIS — E669 Obesity, unspecified: Secondary | ICD-10-CM

## 2013-05-11 LAB — CBC WITH DIFFERENTIAL/PLATELET
Eosinophils Relative: 1.9 % (ref 0.0–5.0)
HCT: 40 % (ref 36.0–46.0)
Hemoglobin: 13.7 g/dL (ref 12.0–15.0)
Lymphs Abs: 1.7 10*3/uL (ref 0.7–4.0)
MCV: 86.7 fl (ref 78.0–100.0)
Monocytes Absolute: 0.5 10*3/uL (ref 0.1–1.0)
Monocytes Relative: 10.5 % (ref 3.0–12.0)
Neutro Abs: 2.7 10*3/uL (ref 1.4–7.7)
Platelets: 301 10*3/uL (ref 150.0–400.0)
WBC: 5.1 10*3/uL (ref 4.5–10.5)

## 2013-05-11 LAB — MICROALBUMIN / CREATININE URINE RATIO
Creatinine,U: 206.1 mg/dL
Microalb, Ur: 0.5 mg/dL (ref 0.0–1.9)

## 2013-05-11 LAB — TSH: TSH: 1.02 u[IU]/mL (ref 0.35–5.50)

## 2013-05-11 LAB — HM DIABETES FOOT EXAM: HM Diabetic Foot Exam: NORMAL

## 2013-05-11 MED ORDER — PAROXETINE HCL ER 12.5 MG PO TB24: 12.5000 mg | ORAL_TABLET | ORAL | Status: DC

## 2013-05-11 MED ORDER — PANTOPRAZOLE SODIUM 40 MG PO TBEC: 40.0000 mg | DELAYED_RELEASE_TABLET | Freq: Every day | ORAL | Status: DC

## 2013-05-11 MED ORDER — ALPRAZOLAM 0.5 MG PO TABS: ORAL_TABLET | ORAL | Status: DC

## 2013-05-11 NOTE — Progress Notes (Addendum)
Patient ID: Sheri [name], female   DOB: 01/25/1956, 57 y.o.   MRN: [id number]  Patient Active Problem List   Diagnosis Date Noted  . Obesity, unspecified 05/13/2013  . Major depressive disorder, single episode 05/13/2013  . Reflux esophagitis 05/11/2013  . Sinus tachycardia 04/24/2012  . Chest pain at rest 04/19/2012  . Diabetes mellitus type 2, diet-controlled 04/18/2012  . Motion sickness 10/29/2011  . Headache, chronic daily 05/17/2011  . Insomnia, psychophysiological 05/17/2011    Subjective:  CC:   Chief Complaint  Patient presents with  . Follow-up    medication refills    HPI:   Sheri [name] a 57 y.o. female who presents for followup on diet-controlled, diabetes, obesity, and hypertension.  Last seen one year ago.  After her last visit she was Referred to [facility] for atypical chest pain, and a Treadmill test was ordered but Still deferred by patient. She continues to have episodes of chest pain which are brought on by anxiety.  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  Her mother lives with her. She has a sister who is not helpful. She states that she has no time to deal with her own health issues.  Too financially stressed by husband's multiple health issues and specialist fees .  Has been having the chest pain every night for the last 3 to 4 wreeks,  At nightime mostly.  Occasionally at work,  When emotionally stressed .  Eats late at night,  Often within 2 hours of going to bed.  Still caring for her aunt who was discharged from the [facility] for leaving  And wandering around the parking lot .  Now she is at [facility] but has to be transported from dialysis back to [facility] 3 times per week  Her affect is depressed,  She has not had a vacation in 3 years.  Has no energy to do anything but is still functional at work.,  Husband is not supportive infrequently criticizes her for not participating  more. .  Sister does not help care for mother who lives with her, despite a previous arrangement with sister for a monthly weekend respite.    Past Medical History  Diagnosis Date  . Hypertension   . Unspecified hereditary and idiopathic peripheral neuropathy   . Diabetes mellitus, type 2   . Osteopenia   . Unspecified sleep apnea     sleep study- [facility]  . Reflux esophagitis   . Diverticulosis of colon without hemorrhage   . Rash and other nonspecific skin eruption   . Personal history of malignant neoplasm of cervix uteri     CIN-3 1989, normal since, gets pap smears annually  . Personal history of other genital system and obstetric disorders(V13.29)   . Abdominal pain, epigastric   . Panic disorder without agoraphobia   . Duodenitis with hemorrhage   . Edema   . Obesity, unspecified   . Hyperlipidemia   . Heart murmur   . Mitral regurgitation     Past Surgical History  Procedure Laterality Date  . Right oophorectomy  2001    menopause, endometriosis s/p.  HRT  since- has not tolerated previous attempts to wean  . Breast cyst aspiration      right , benign       The following portions of the patient's history were reviewed and updated as appropriate: Allergies, current medications, and problem list.  Review of Systems:   12 Pt  review of systems was negative except those addressed in the HPI,     History   Social History  . Marital Status: Married    Spouse Name: N/A    Number of Children: N/A  . Years of Education: N/A   Occupational History  . 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 Smoking status: Never Smoker   . Smokeless tobacco: Never Used  . Alcohol Use: Yes     Comment: rare  . Drug Use: No  . Sexual Activity: Not on file   Other Topics Concern  . Not on file   Social History Narrative   Lives with spouse but the two are emotionally estranged due to spouse's infidelity.          Objective:  Filed  Vitals:   05/11/13 0848  BP: 128/94  Pulse: 97  Temp: 98.3 F (36.8 C)  Resp: 14     General appearance: alert, cooperative and appears stated age Ears: normal TM's and external ear canals both ears Throat: lips, mucosa, and tongue normal; teeth and gums normal Neck: no adenopathy, no carotid bruit, supple, symmetrical, trachea midline and thyroid not enlarged, symmetric, no tenderness/mass/nodules Back: symmetric, no curvature. ROM normal. No CVA tenderness. Lungs: clear to auscultation bilaterally Heart: regular rate and rhythm, S1, S2 normal, no murmur, click, rub or gallop Abdomen: soft, non-tender; bowel sounds normal; no masses,  no organomegaly Pulses: 2+ and symmetric Skin: Skin color, texture, turgor normal. No rashes or lesions Lymph nodes: Cervical, supraclavicular, and axillary nodes normal. Foot exam:  Nails are well trimmed,  No callouses,  Sensation intact to microfilament Psych:  Flat affect, tearful at times, makes good eye contact. Not suicidal.  Assessment and Plan:  Chest pain at rest She has multiple risk factors but her pain is atypical. A since she deferred the stress test that was offered by Dr. Benard Rink she is free to resume that evaluation whatever she wants. In the meantime I'll try treating for reflux esophagitis with Protonix in the morning and Zantac and evening. Discussed her dietary behaviors which maybe contributing to nighttime esophagitis, including eating late at night and going to bed..  Reflux esophagitis Suggested by nighttime symptoms of chest pain radiating to back. Trial of proton and Zantac. Dietary behaviors addressed.  Diabetes mellitus type 2, diet-controlled She has been lost to followup for a year. 4 she'll she has a sensible diet and her A1c is 5.8. She has no proteinuria. Is on an ACE inhibitor and has mild hyperlipidemia. Will recommend aspirin and statin when she returns for three-month followup. Foot exam is normal. She is overdue  for her diabetic eye exam.  Obesity, unspecified I have addressed  BMI today  and recommended a low glycemic index diet utilizing smaller more frequent meals to increase metabolism.  I have also recommended that patient start exercising with a goal of 30 minutes of aerobic exercise a minimum of 5 days per week.   Major depressive disorder, single episode 40 minutes was spent with patient today addressing her chronic conditions in her current state of depression. Trial of Paxil. Referral to [facility] advised for talk therapy. She will return in 3 weeks.   Updated Medication List Outpatient Encounter Prescriptions as of 05/11/2013  Medication Sig Dispense Refill  . ALPRAZolam (XANAX) 0.5 MG tablet Take one tablet by mouth two times a day.  30 tablet  2  .  furosemide (LASIX) 20 MG tablet Take 1 tablet (20 mg total) by mouth daily. Take one daily as needed for fluid retention  30 tablet  3  . lisinopril-hydrochlorothiazide (PRINZIDE,ZESTORETIC) 20-25 MG per tablet Take 1 tablet by mouth daily.  30 tablet  0  . [DISCONTINUED] ALPRAZolam (XANAX) 0.25 MG tablet Take one tablet by mouth two times a day.  60 tablet  5  . glucose blood (ONE TOUCH TEST STRIPS) test strip Use as instructed       . pantoprazole (PROTONIX) 40 MG tablet Take 1 tablet (40 mg total) by mouth daily.  30 tablet  3  . PARoxetine (PAXIL-CR) 12.5 MG 24 hr tablet Take 1 tablet (12.5 mg total) by mouth every morning.  30 tablet  2  . [DISCONTINUED] estrogen, conjugated,-medroxyprogesterone (PREMPRO) 0.3-1.5 MG per tablet Take 1 tablet by mouth daily.        . [DISCONTINUED] zolpidem (AMBIEN) 10 MG tablet Take 1 tablet (10 mg total) by mouth at bedtime as needed for sleep.  30 tablet  5   No facility-administered encounter medications on file as of 05/11/2013.

## 2013-05-11 NOTE — Patient Instructions (Addendum)
I am prescribing Paxil to take once daily for your depression.  Please return in 3 weeks to see me   I think you would benefit from seeing a therapist to help you learn to draw boundaries for yourself.  I recommend Dr . Ludwig Clarks, PhD  [phone]  [address] [place] [address]  Your nighttime chest pain sounds like reflux esophagitis.  I am prescribing protonix to help this. tTake it in the morning,  And take your zantacc at night, Please DO NOT EAT WITHIN TWO HOURS of going to bed.     I recommend that you lose 24 lbs over the next 6 months with a low glycemic index diet and regular exercise.  This is  One version of a  "Low GI"  Diet:  It will still lower your blood sugars and allow you to lose 4 to 8  lbs  per month if you follow it carefully.  Your goal with exercise is a minimum of 30 minutes of aerobic exercise 5 days per week (Walking does not count once it becomes easy!)    All of the foods can be found at grocery stores and in bulk at [employer].  The Atkins protein bars and shakes are available in more varieties at Target, WalMart and Lowe's Foods.     7 AM Breakfast:  Choose from the following:  Low carbohydrate Protein  Shakes (I recommend the EAS AdvantEdge "Carb Control" shakes  Or the low carb shakes by Atkins.    2.5 carbs   Arnold's "Sandwhich Thin"toasted  w/ peanut butter (no jelly: about 20 net carbs  "Bagel Thin" with cream cheese and salmon: about 20 carbs   a scrambled egg/bacon/cheese burrito made with Mission's "carb balance" whole wheat tortilla  (about 10 net carbs )   Avoid cereal and bananas, oatmeal and cream of wheat and grits. They are loaded with carbohydrates!   10 AM: high protein snack  Protein bar by Atkins (the snack size, under 200 cal, usually < 6 net carbs).    A stick of cheese:  Around 1 carb,  100 cal     Dannon Light n Fit [place] Yogurt  (80 cal, 8 carbs)  Other so called "protein bars" and Greek yogurts tend to be loaded  with carbohydrates.  Remember, in food advertising, the word "energy" is synonymous for " carbohydrate."  Lunch:   A Sandwich using the bread choices listed, Can use any  Eggs,  lunchmeat, grilled meat or canned tuna), avocado, regular mayo/mustard  and cheese.  A Salad using blue cheese, ranch,  Goddess or vinagrette,  No croutons or "confetti" and no "candied nuts" but regular nuts OK.   No pretzels or chips.  Pickles and miniature sweet peppers are a good low carb alternative that provide a "crunch"  The bread is the only source of carbohydrate in a sandwich and  can be decreased by trying some of these alternatives to traditional loaf bread  Joseph's makes a pita bread and a flat bread that are 50 cal and 4 net carbs available at BJs and WalMart.  This can be toasted to use with hummous as well  Toufayan makes a low carb flatbread that's 100 cal and 9 net carbs available at [employer] and [employer] makes 2 sizes of  Low carb whole wheat tortilla  (The large one is 210 cal and 6 net carbs) Avoid "Low fat dressings, as well as [employer]  and [facility] dressings They are loaded with sugar!   3 PM/ Mid day  Snack:  Consider  1 ounce of  almonds, walnuts, pistachios, pecans, peanuts,  Macadamia nuts or a nut medley.  Avoid "granola"; the dried cranberries and raisins are loaded with carbohydrates. Mixed nuts as long as there are no raisins,  cranberries or dried fruit.     6 PM  Dinner:     Meat/fowl/fish with a green salad, and either broccoli, cauliflower, green beans, spinach, brussel sprouts or  Lima beans. DO NOT BREAD THE PROTEIN!!      There is a low carb pasta by Dreamfield's that is acceptable and tastes great: only 5 digestible carbs/serving.( All grocery stores but BJs carry it )  Try Kai Levins Angelo's chicken piccata or chicken or eggplant parm over low carb pasta.(Lowes and BJs)   Clifton Custard Sanchez's "Carnitas" (pulled pork, no sauce,  0 carbs) or his beef pot roast to make a  dinner burrito (at BJ's)  Pesto over low carb pasta (bj's sells a good quality pesto in the center refrigerated section of the deli   Whole wheat pasta is still full of digestible carbs and  Not as low in glycemic index as Dreamfield's.   Brown rice is still rice,  So skip the rice and noodles if you eat [place] or [place] (or at least limit to 1/2 cup)  9 PM snack :   Breyer's "low carb" fudgsicle or  ice cream bar (Carb Smart line), or  Weight Watcher's ice cream bar , or another "no sugar added" ice cream;  a serving of fresh berries/cherries with whipped cream   Cheese or DANNON'S LlGHT N FIT GREEK YOGURT  Avoid bananas, pineapple, grapes  and watermelon on a regular basis because they are high in sugar.  THINK OF THEM AS DESSERT  Remember that snack Substitutions should be less than 10 NET carbs per serving and meals < 20 carbs. Remember to subtract fiber grams to get the "net carbs." I

## 2013-05-12 LAB — COMPREHENSIVE METABOLIC PANEL
AST: 17 IU/L (ref 0–40)
Albumin: 4.6 g/dL (ref 3.5–5.5)
Alkaline Phosphatase: 69 IU/L (ref 39–117)
BUN/Creatinine Ratio: 29 — ABNORMAL HIGH (ref 9–23)
BUN: 18 mg/dL (ref 6–24)
CO2: 24 mmol/L (ref 18–29)
Calcium: 9.7 mg/dL (ref 8.7–10.2)
Chloride: 103 mmol/L (ref 97–108)
Creatinine, Ser: 0.63 mg/dL (ref 0.57–1.00)
Globulin, Total: 2.2 g/dL (ref 1.5–4.5)

## 2013-05-12 LAB — CK TOTAL AND CKMB (NOT AT ARMC)
CK-MB Index: 2.2 ng/mL (ref 0.0–2.9)
Total CK: 88 U/L (ref 24–173)

## 2013-05-13 ENCOUNTER — Encounter: Payer: Self-pay | Admitting: Internal Medicine

## 2013-05-13 DIAGNOSIS — E669 Obesity, unspecified: Secondary | ICD-10-CM | POA: Insufficient documentation

## 2013-05-13 DIAGNOSIS — E1159 Type 2 diabetes mellitus with other circulatory complications: Secondary | ICD-10-CM | POA: Insufficient documentation

## 2013-05-13 DIAGNOSIS — F329 Major depressive disorder, single episode, unspecified: Secondary | ICD-10-CM | POA: Insufficient documentation

## 2013-05-13 DIAGNOSIS — F320 Major depressive disorder, single episode, mild: Secondary | ICD-10-CM | POA: Insufficient documentation

## 2013-05-13 NOTE — Assessment & Plan Note (Signed)
Suggested by nighttime symptoms of chest pain radiating to back. Trial of proton and Zantac. Dietary behaviors addressed.

## 2013-05-13 NOTE — Assessment & Plan Note (Signed)
I have addressed  BMI today  and recommended a low glycemic index diet utilizing smaller more frequent meals to increase metabolism.  I have also recommended that patient start exercising with a goal of 30 minutes of aerobic exercise a minimum of 5 days per week.

## 2013-05-13 NOTE — Assessment & Plan Note (Signed)
40 minutes was spent with patient today addressing her chronic conditions in her current state of depression. Trial of Paxil. Referral to Ludwig Clarks advised for talk therapy. She will return in 3 weeks.

## 2013-05-13 NOTE — Assessment & Plan Note (Addendum)
She has multiple risk factors but her pain is atypical. A since she deferred the stress test that was offered by Dr. Benard Rink she is free to resume that evaluation whatever she wants. In the meantime I'll try treating for reflux esophagitis with Protonix in the morning and Zantac and evening. Discussed her dietary behaviors which maybe contributing to nighttime esophagitis, including eating late at night and going to bed.Marland Kitchen

## 2013-05-13 NOTE — Assessment & Plan Note (Addendum)
She has been lost to followup for a year. 4 she'll she has a sensible diet and her A1c is 5.8. She has no proteinuria. Is on an ACE inhibitor and has mild hyperlipidemia. Will recommend aspirin and statin when she returns for three-month followup. Foot exam is normal. She is overdue for her diabetic eye exam.

## 2013-05-16 ENCOUNTER — Other Ambulatory Visit: Payer: Self-pay | Admitting: Internal Medicine

## 2013-05-28 ENCOUNTER — Encounter: Payer: Self-pay | Admitting: Internal Medicine

## 2013-05-29 ENCOUNTER — Ambulatory Visit (INDEPENDENT_AMBULATORY_CARE_PROVIDER_SITE_OTHER): Payer: 59 | Admitting: Internal Medicine

## 2013-05-29 ENCOUNTER — Encounter: Payer: Self-pay | Admitting: Internal Medicine

## 2013-05-29 VITALS — BP 106/72 | HR 89 | Temp 98.9°F | Resp 12 | Ht 63.0 in | Wt 208.0 lb

## 2013-05-29 DIAGNOSIS — F329 Major depressive disorder, single episode, unspecified: Secondary | ICD-10-CM

## 2013-05-29 DIAGNOSIS — R079 Chest pain, unspecified: Secondary | ICD-10-CM

## 2013-05-29 DIAGNOSIS — Z1239 Encounter for other screening for malignant neoplasm of breast: Secondary | ICD-10-CM

## 2013-05-29 DIAGNOSIS — F5104 Psychophysiologic insomnia: Secondary | ICD-10-CM

## 2013-05-29 DIAGNOSIS — Z860100 Personal history of colon polyps, unspecified: Secondary | ICD-10-CM

## 2013-05-29 DIAGNOSIS — E669 Obesity, unspecified: Secondary | ICD-10-CM

## 2013-05-29 DIAGNOSIS — G4700 Insomnia, unspecified: Secondary | ICD-10-CM

## 2013-05-29 MED ORDER — ZOLPIDEM TARTRATE 10 MG PO TABS
10.0000 mg | ORAL_TABLET | Freq: Every evening | ORAL | Status: DC | PRN
Start: 2013-05-29 — End: 2013-11-28

## 2013-05-29 NOTE — Assessment & Plan Note (Signed)
Continue daily protonic for esophagitis.

## 2013-05-29 NOTE — Assessment & Plan Note (Signed)
Improving with treatment of insomnia and management of anxiety. Continue alprazolam Paxil and Ambien. No changes today. Return in 3 months.

## 2013-05-29 NOTE — Progress Notes (Signed)
Patient ID: Sheri [name], female   DOB: May 18, 1956, 57 y.o.   MRN: [id number]  Patient Active Problem List   Diagnosis Date Noted  . Obesity, unspecified 05/13/2013  . Major depressive disorder, single episode 05/13/2013  . Reflux esophagitis 05/11/2013  . Sinus tachycardia 04/24/2012  . Chest pain at rest 04/19/2012  . Diabetes mellitus type 2, diet-controlled 04/18/2012  . Motion sickness 10/29/2011  . Headache, chronic daily 05/17/2011  . Insomnia, psychophysiological 05/17/2011    Subjective:  CC:   Chief Complaint  Patient presents with  . Follow-up    HPI:   Sheri [name] a 57 y.o. female who presents for 3 week follow up on chst pain, insomnia , anxiety and obesity.  We initiated PPI trial with protonic for presumed esophagitis. Her chest pain has resolved.   Chronic insomnia. Since we initiated treatment for anxiety with Paxil and bedtime alprazolam , She is resting better and getting up easier.  Not trying to solve every thing at bedtime .  Obesity : Has lost 6 lbs in 3 weeks using weight watcherprogram. She is not exercising at but has considered joining a coworker who is urging her to use the Hospital gym and its personal trainer.s    Past Medical History  Diagnosis Date  . Hypertension   . Unspecified hereditary and idiopathic peripheral neuropathy   . Diabetes mellitus, type 2   . Osteopenia   . Unspecified sleep apnea     sleep study- [facility]  . Reflux esophagitis   . Diverticulosis of colon without hemorrhage   . Rash and other nonspecific skin eruption   . Personal history of malignant neoplasm of cervix uteri     CIN-3 1989, normal since, gets pap smears annually  . Personal history of other genital system and obstetric disorders(V13.29)   . Abdominal pain, epigastric   . Panic disorder without agoraphobia   . Duodenitis with hemorrhage   . Edema   . Obesity, unspecified   . Hyperlipidemia   . Heart murmur   . Mitral regurgitation     Past  Surgical History  Procedure Laterality Date  . Right oophorectomy  2001    menopause, endometriosis s/p.  HRT  since- has not tolerated previous attempts to wean  . Breast cyst aspiration      right , benign       The following portions of the patient's history were reviewed and updated as appropriate: Allergies, current medications, and problem list.    Review of Systems:   12 Pt  review of systems was negative except those addressed in the HPI,     History   Social History  . Marital Status: Married    Spouse Name: N/A    Number of Children: N/A  . Years of Education: N/A   Occupational History  . 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 Smoking status: Never Smoker   . Smokeless tobacco: Never Used  . Alcohol Use: Yes     Comment: rare  . Drug Use: No  . Sexual Activity: Not on file   Other Topics Concern  . Not on file   Social History Narrative   Lives with spouse but the two are emotionally estranged due to spouse's infidelity.          Objective:  Filed Vitals:   05/29/13 0858  BP: 106/72  Pulse: 89  Temp: 98.9 F (37.2 C)  Resp: 12  General appearance: alert, cooperative and appears stated age Ears: normal TM's and external ear canals both ears Throat: lips, mucosa, and tongue normal; teeth and gums normal Neck: no adenopathy, no carotid bruit, supple, symmetrical, trachea midline and thyroid not enlarged, symmetric, no tenderness/mass/nodules Back: symmetric, no curvature. ROM normal. No CVA tenderness. Lungs: clear to auscultation bilaterally Heart: regular rate and rhythm, S1, S2 normal, no murmur, click, rub or gallop Abdomen: soft, non-tender; bowel sounds normal; no masses,  no organomegaly Pulses: 2+ and symmetric Skin: Skin color, texture, turgor normal. No rashes or lesions Lymph nodes: Cervical, supraclavicular, and axillary nodes normal.  Assessment and Plan:  Obesity, unspecified She has lost  4-6 pounds over the last several weeks using Weight Watchers program. She has not started exercising yet but does have a coworker who is encouraging her to start going with him. I have strongly encouraged her to use the buddy system to help want her exercise efforts.  Major depressive disorder, single episode Improving with treatment of insomnia and management of anxiety. Continue alprazolam Paxil and Ambien. No changes today. Return in 3 months.  Insomnia, psychophysiological Improved with addition of low-dose alprazolam to Ambien at night for management of anxiety and frequent intrusive thoughts.  Chest pain at rest Continue daily protonic for esophagitis.   Updated Medication List Outpatient Encounter Prescriptions as of 05/29/2013  Medication Sig Dispense Refill  . ALPRAZolam (XANAX) 0.5 MG tablet Take one tablet by mouth two times a day.  30 tablet  2  . furosemide (LASIX) 20 MG tablet Take 1 tablet (20 mg total) by mouth daily. Take one daily as needed for fluid retention  30 tablet  3  . glucose blood (ONE TOUCH TEST STRIPS) test strip Use as instructed       . lisinopril-hydrochlorothiazide (PRINZIDE,ZESTORETIC) 20-25 MG per tablet Take 1 tablet by mouth daily.  30 tablet  0  . pantoprazole (PROTONIX) 40 MG tablet Take 1 tablet (40 mg total) by mouth daily.  30 tablet  3  . PARoxetine (PAXIL-CR) 12.5 MG 24 hr tablet Take 1 tablet (12.5 mg total) by mouth every morning.  30 tablet  2  . zolpidem (AMBIEN) 10 MG tablet Take 1 tablet (10 mg total) by mouth at bedtime as needed for sleep.  30 tablet  5  . [DISCONTINUED] zolpidem (AMBIEN) 10 MG tablet Take 1 tablet (10 mg total) by mouth at bedtime as needed for sleep.  30 tablet  5  . [DISCONTINUED] zolpidem (AMBIEN) 10 MG tablet Take 10 mg by mouth at bedtime as needed for sleep.       No facility-administered encounter medications on file as of 05/29/2013.     Orders Placed This Encounter  Procedures  . HM MAMMOGRAPHY  . MM Digital  Screening  . HM PAP SMEAR  . Ambulatory referral to Gastroenterology    Return in about 3 months (around 08/28/2013).

## 2013-05-29 NOTE — Assessment & Plan Note (Signed)
She has lost 4-6 pounds over the last several weeks using Weight Watchers program. She has not started exercising yet but does have a coworker who is encouraging her to start going with him. I have strongly encouraged her to use the buddy system to help want her exercise efforts.   

## 2013-05-29 NOTE — Assessment & Plan Note (Signed)
Improved with addition of low-dose alprazolam to Ambien at night for management of anxiety and frequent intrusive thoughts.

## 2013-06-11 ENCOUNTER — Other Ambulatory Visit: Payer: Self-pay | Admitting: *Deleted

## 2013-06-11 MED ORDER — LISINOPRIL-HYDROCHLOROTHIAZIDE 20-25 MG PO TABS: 1.0000 | ORAL_TABLET | Freq: Every day | ORAL | Status: DC

## 2013-06-11 NOTE — Telephone Encounter (Signed)
Eprescribed.

## 2013-07-26 ENCOUNTER — Other Ambulatory Visit: Payer: Self-pay

## 2013-08-02 ENCOUNTER — Ambulatory Visit: Payer: Self-pay | Admitting: Internal Medicine

## 2013-08-21 ENCOUNTER — Encounter: Payer: Self-pay | Admitting: Internal Medicine

## 2013-08-23 ENCOUNTER — Other Ambulatory Visit: Payer: Self-pay | Admitting: *Deleted

## 2013-08-23 DIAGNOSIS — F411 Generalized anxiety disorder: Secondary | ICD-10-CM

## 2013-08-24 MED ORDER — ALPRAZOLAM 0.5 MG PO TABS: ORAL_TABLET | ORAL | Status: DC

## 2013-08-24 NOTE — Telephone Encounter (Signed)
Script faxed to pharmacy

## 2013-08-28 ENCOUNTER — Ambulatory Visit (INDEPENDENT_AMBULATORY_CARE_PROVIDER_SITE_OTHER): Payer: 59 | Admitting: Internal Medicine

## 2013-08-28 ENCOUNTER — Encounter: Payer: Self-pay | Admitting: Internal Medicine

## 2013-08-28 VITALS — BP 138/84 | HR 91 | Temp 98.4°F | Resp 12 | Ht 63.0 in | Wt 217.5 lb

## 2013-08-28 DIAGNOSIS — E119 Type 2 diabetes mellitus without complications: Secondary | ICD-10-CM

## 2013-08-28 DIAGNOSIS — F329 Major depressive disorder, single episode, unspecified: Secondary | ICD-10-CM

## 2013-08-28 MED ORDER — GABAPENTIN 100 MG PO CAPS: 100.0000 mg | ORAL_CAPSULE | Freq: Three times a day (TID) | ORAL | Status: DC

## 2013-08-28 MED ORDER — CONJ ESTROG-MEDROXYPROGEST ACE 0.3-1.5 MG PO TABS: 1.0000 | ORAL_TABLET | Freq: Every day | ORAL | Status: DC

## 2013-08-28 NOTE — Assessment & Plan Note (Signed)
She asaw no improvment with paxil so she discontinued it.  Identified  Menopausal hot flashes, economic instabiltiy and burnout dur to overextended responsibilities as major contributors  to overextension

## 2013-08-28 NOTE — Progress Notes (Addendum)
Patient ID: Sheri [name], female   DOB: 20-Dec-1955, 57 y.o.   MRN: [id number]   Patient Active Problem List   Diagnosis Date Noted  . Obesity, unspecified 05/13/2013  . Major depressive disorder, single episode 05/13/2013  . Reflux esophagitis 05/11/2013  . Sinus tachycardia 04/24/2012  . Chest pain at rest 04/19/2012  . Diabetes mellitus type 2, diet-controlled 04/18/2012  . Motion sickness 10/29/2011  . Headache, chronic daily 05/17/2011  . Insomnia, psychophysiological 05/17/2011    Subjective:  CC:   Chief Complaint  Patient presents with  . Follow-up    3 month    HPI:   Sheri Muhs Chrismonis a 57 y.o. female who presents Follow up on depression/anxiety.  She stopped the paxil  Due to lack of effect.  Has been waking up at 3 am often due to recurrent night sweats disrupting her sleep.  Took prempro for years until last year; she stopped it primarily due to to length of time    Feet are burning at night .  Mom is moving to a senior citizen's apt and she gave up some duties at church so her load  Is lighter.  Discussed downsizing home with husband who is out of work , and recovering from two surgeries (lithotripsy [facility],  [place]es $1800) followed by a seconf lithotripsy and extraction under general anesthesia .  She is financially strapped currently  Which are resulting in medical bills over $350/month   Past Medical History  Diagnosis Date  . Hypertension   . Unspecified hereditary and idiopathic peripheral neuropathy   . Diabetes mellitus, type 2   . Osteopenia   . Unspecified sleep apnea     sleep study- [facility]  . Reflux esophagitis   . Diverticulosis of colon without hemorrhage   . Rash and other nonspecific skin eruption   . Personal history of malignant neoplasm of cervix uteri     CIN-3 1989, normal since, gets pap smears annually  . Personal history of other genital system and obstetric disorders(V13.29)   . Abdominal pain, epigastric   . Panic  disorder without agoraphobia   . Duodenitis with hemorrhage   . Edema   . Obesity, unspecified   . Hyperlipidemia   . Heart murmur   . Mitral regurgitation     Past Surgical History  Procedure Laterality Date  . Right oophorectomy  2001    menopause, endometriosis s/p.  HRT  since- has not tolerated previous attempts to wean  . Breast cyst aspiration      right , benign       The following portions of the patient's history were reviewed and updated as appropriate: Allergies, current medications, and problem list.    Review of Systems:   12 Pt  review of systems was negative except those addressed in the HPI,     History   Social History  . Marital Status: Married    Spouse Name: N/A    Number of Children: N/A  . Years of Education: N/A   Occupational History  . 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 Smoking status: Never Smoker   . Smokeless tobacco: Never Used  . Alcohol Use: Yes     Comment: rare  . Drug Use: No  . Sexual Activity: Not on file   Other Topics Concern  . Not on file   Social History Narrative   Lives with spouse but the two are emotionally estranged due  to spouse's infidelity.          Objective:  Filed Vitals:   08/28/13 0809  BP: 138/84  Pulse: 91  Temp: 98.4 F (36.9 C)  Resp: 12     General appearance: alert, cooperative and appears stated age Ears: normal TM's and external ear canals both ears Throat: lips, mucosa, and tongue normal; teeth and gums normal Neck: no adenopathy, no carotid bruit, supple, symmetrical, trachea midline and thyroid not enlarged, symmetric, no tenderness/mass/nodules Back: symmetric, no curvature. ROM normal. No CVA tenderness. Lungs: clear to auscultation bilaterally Heart: regular rate and rhythm, S1, S2 normal, no murmur, click, rub or gallop Abdomen: soft, non-tender; bowel sounds normal; no masses,  no organomegaly Pulses: 2+ and symmetric Skin: Skin color,  texture, turgor normal. No rashes or lesions Lymph nodes: Cervical, supraclavicular, and axillary nodes normal.  Assessment and Plan:  Major depressive disorder, single episode She asaw no improvment with paxil so she discontinued it.  Identified  Menopausal hot flashes, economic instabiltiy and burnout dur to overextended responsibilities as major contributors  to overextension   Diabetes mellitus type 2, diet-controlled Well-controlled on diet alone .  hemoglobin A1c has been consistently less than 7.0 . Patient is up-to-date on eye exams and foot exam was done today.  There is  no proteinuria on today's micro urinalysis .  Fasting lipids have been reviewed and statin therapy has been advised and updated according to new ACC guidelines based on patient's 10 year risk of CAD    Updated Medication List Outpatient Encounter Prescriptions as of 08/28/2013  Medication Sig  . furosemide (LASIX) 20 MG tablet Take 1 tablet (20 mg total) by mouth daily. Take one daily as needed for fluid retention  . glucose blood (ONE TOUCH TEST STRIPS) test strip Use as instructed   . lisinopril-hydrochlorothiazide (PRINZIDE,ZESTORETIC) 20-25 MG per tablet Take 1 tablet by mouth daily.  . pantoprazole (PROTONIX) 40 MG tablet Take 1 tablet (40 mg total) by mouth daily.  Marland Kitchen zolpidem (AMBIEN) 10 MG tablet Take 1 tablet (10 mg total) by mouth at bedtime as needed for sleep.  Marland Kitchen ALPRAZolam (XANAX) 0.5 MG tablet Take one tablet by mouth two times a day.  . estrogen, conjugated,-medroxyprogesterone (PREMPRO) 0.3-1.5 MG per tablet Take 1 tablet by mouth daily.  Marland Kitchen gabapentin (NEURONTIN) 100 MG capsule Take 1 capsule (100 mg total) by mouth 3 (three) times daily.  Marland Kitchen PARoxetine (PAXIL-CR) 12.5 MG 24 hr tablet Take 1 tablet (12.5 mg total) by mouth every morning.     No orders of the defined types were placed in this encounter.    No Follow-up on file.

## 2013-08-28 NOTE — Assessment & Plan Note (Signed)
Well-controlled on diet alone .  hemoglobin A1c has been consistently less than 7.0 . Patient is up-to-date on eye exams and foot exam was done today.  There is  no proteinuria on today's micro urinalysis .  Fasting lipids have been reviewed and statin therapy has been advised and updated according to new ACC guidelines based on patient's 10 year risk of CAD  

## 2013-10-01 ENCOUNTER — Other Ambulatory Visit: Payer: Self-pay | Admitting: Internal Medicine

## 2013-10-15 LAB — HM DIABETES EYE EXAM

## 2013-10-29 ENCOUNTER — Other Ambulatory Visit: Payer: Self-pay | Admitting: Internal Medicine

## 2013-10-29 ENCOUNTER — Encounter: Payer: Self-pay | Admitting: Internal Medicine

## 2013-11-09 ENCOUNTER — Other Ambulatory Visit: Payer: Self-pay | Admitting: Internal Medicine

## 2013-11-09 NOTE — Telephone Encounter (Signed)
Ok to refill,  printed rx  

## 2013-11-09 NOTE — Telephone Encounter (Signed)
Script faxed.

## 2013-11-09 NOTE — Telephone Encounter (Signed)
Last visit 08/28/13, ok refill?

## 2013-11-27 ENCOUNTER — Telehealth: Payer: Self-pay | Admitting: *Deleted

## 2013-11-27 DIAGNOSIS — E119 Type 2 diabetes mellitus without complications: Secondary | ICD-10-CM

## 2013-11-27 NOTE — Telephone Encounter (Signed)
Pt is coming in tomorrow what labs and dx?  

## 2013-11-28 ENCOUNTER — Other Ambulatory Visit (INDEPENDENT_AMBULATORY_CARE_PROVIDER_SITE_OTHER): Payer: 59

## 2013-11-28 ENCOUNTER — Telehealth: Payer: Self-pay | Admitting: Internal Medicine

## 2013-11-28 DIAGNOSIS — E119 Type 2 diabetes mellitus without complications: Secondary | ICD-10-CM

## 2013-11-28 LAB — MICROALBUMIN / CREATININE URINE RATIO
CREATININE, U: 191.8 mg/dL
MICROALB UR: 0.4 mg/dL (ref 0.0–1.9)
Microalb Creat Ratio: 0.2 mg/g (ref 0.0–30.0)

## 2013-11-28 LAB — COMPREHENSIVE METABOLIC PANEL
ALT: 21 U/L (ref 0–35)
AST: 20 U/L (ref 0–37)
Albumin: 4 g/dL (ref 3.5–5.2)
Alkaline Phosphatase: 55 U/L (ref 39–117)
BILIRUBIN TOTAL: 0.9 mg/dL (ref 0.3–1.2)
BUN: 22 mg/dL (ref 6–23)
CHLORIDE: 104 meq/L (ref 96–112)
CO2: 27 mEq/L (ref 19–32)
Calcium: 9.1 mg/dL (ref 8.4–10.5)
Creatinine, Ser: 0.7 mg/dL (ref 0.4–1.2)
GFR: 92.99 mL/min (ref >60.00)
Glucose, Bld: 113 mg/dL — ABNORMAL HIGH (ref 70–99)
Potassium: 4 mEq/L (ref 3.5–5.1)
SODIUM: 136 meq/L (ref 135–145)
TOTAL PROTEIN: 7 g/dL (ref 6.0–8.3)

## 2013-11-28 LAB — LIPID PANEL
CHOL/HDL RATIO: 5
Cholesterol: 216 mg/dL — ABNORMAL HIGH (ref 0–200)
HDL: 41.6 mg/dL (ref >39.00)
LDL Cholesterol: 132 mg/dL — ABNORMAL HIGH (ref 0–99)
Triglycerides: 213 mg/dL — ABNORMAL HIGH (ref 0.0–149.0)
VLDL: 42.6 mg/dL — ABNORMAL HIGH (ref 0.0–40.0)

## 2013-11-28 LAB — HEMOGLOBIN A1C: Hgb A1c MFr Bld: 5.8 % (ref 4.6–6.5)

## 2013-11-28 MED ORDER — ZOLPIDEM TARTRATE 10 MG PO TABS: 10.0000 mg | ORAL_TABLET | Freq: Every evening | ORAL | Status: DC | PRN

## 2013-11-28 NOTE — Telephone Encounter (Signed)
Faxed script to pharmacy

## 2013-11-28 NOTE — Telephone Encounter (Signed)
Pt only has 1 pill left for tonight

## 2013-11-28 NOTE — Telephone Encounter (Signed)
Please advise.  Ok to fill? 

## 2013-11-28 NOTE — Telephone Encounter (Signed)
Pt came in for labs today  Her appointment for Friday was r/s to 3/20  She needs refill on Zolpidem  [facility]

## 2013-11-28 NOTE — Telephone Encounter (Signed)
Ok to refill,  printed rx  

## 2013-11-30 ENCOUNTER — Ambulatory Visit: Payer: 59 | Admitting: Internal Medicine

## 2013-11-30 ENCOUNTER — Encounter: Payer: Self-pay | Admitting: Internal Medicine

## 2013-12-05 ENCOUNTER — Telehealth: Payer: Self-pay

## 2013-12-05 NOTE — Telephone Encounter (Signed)
Relevant patient education assigned to patient using Emmi. ° °

## 2013-12-07 ENCOUNTER — Ambulatory Visit: Payer: 59 | Admitting: Internal Medicine

## 2013-12-13 ENCOUNTER — Ambulatory Visit (INDEPENDENT_AMBULATORY_CARE_PROVIDER_SITE_OTHER): Payer: 59 | Admitting: Internal Medicine

## 2013-12-13 ENCOUNTER — Encounter: Payer: Self-pay | Admitting: Internal Medicine

## 2013-12-13 VITALS — BP 110/78 | HR 93 | Temp 98.1°F | Resp 16 | Wt 221.8 lb

## 2013-12-13 DIAGNOSIS — E669 Obesity, unspecified: Secondary | ICD-10-CM

## 2013-12-13 DIAGNOSIS — G8929 Other chronic pain: Secondary | ICD-10-CM

## 2013-12-13 DIAGNOSIS — M25511 Pain in right shoulder: Principal | ICD-10-CM

## 2013-12-13 DIAGNOSIS — M25519 Pain in unspecified shoulder: Secondary | ICD-10-CM

## 2013-12-13 DIAGNOSIS — F329 Major depressive disorder, single episode, unspecified: Secondary | ICD-10-CM

## 2013-12-13 DIAGNOSIS — E119 Type 2 diabetes mellitus without complications: Secondary | ICD-10-CM

## 2013-12-13 MED ORDER — ATORVASTATIN CALCIUM 20 MG PO TABS: 20.0000 mg | ORAL_TABLET | Freq: Every day | ORAL | Status: DC

## 2013-12-13 MED ORDER — ALPRAZOLAM 0.5 MG PO TABS: 0.5000 mg | ORAL_TABLET | Freq: Two times a day (BID) | ORAL | Status: DC | PRN

## 2013-12-13 MED ORDER — MELOXICAM 15 MG PO TABS: 15.0000 mg | ORAL_TABLET | Freq: Every day | ORAL | Status: DC

## 2013-12-13 MED ORDER — GABAPENTIN 100 MG PO CAPS
200.0000 mg | ORAL_CAPSULE | Freq: Three times a day (TID) | ORAL | Status: DC
Start: 2013-12-13 — End: 2014-02-15

## 2013-12-13 NOTE — Patient Instructions (Addendum)
We are increasing the gabapentin to 100 mg am,  Afternoon and 300 mg at bedtime  (5 daily)  In one week you can increase morning or afternoon dose to 200 mg if needed   If still no better, call for Lyrica trial   We are adding low dose Lipitor for your cholesterol.  Take at night with a baby aspirin  Try going for 30 minute walk ,  Work up to 5 days weekly   This is  One version of a  "Low GI"  Diet:  It will still lower your blood sugars and allow you to lose 4 to 8  lbs  per month if you follow it carefully.  Your goal with exercise is a minimum of 30 minutes of aerobic exercise 5 days per week (Walking does not count once it becomes easy!)    All of the foods can be found at grocery stores and in bulk at [employer].  The Atkins protein bars and shakes are available in more varieties at Target, [employer] and [place].     7 AM Breakfast:  Choose from the following:  Low carbohydrate Protein  Shakes (I recommend the [employer]AS [employer]vantEdge "Carb Control" shakes  Or the low carb shakes by Atkins.    2.5 carbs   Arnold's "Sandwhich Thin"toasted  w/ peanut butter (no jelly: about 20 net carbs  "Bagel Thin" with cream cheese and salmon: about 20 carbs   a scrambled egg/bacon/cheese burrito made with Mission's "carb balance" whole wheat tortilla  (about 10 net carbs )   Avoid cereal and bananas, oatmeal and cream of wheat and grits. They are loaded with carbohydrates!   10 AM: high protein snack  Protein bar by Atkins (the snack size, under 200 cal, usually < 6 net carbs).    A stick of cheese:  Around 1 carb,  100 cal     Dannon Light n Fit [place] Yogurt  (80 cal, 8 carbs)  Other so called "protein bars" and Greek yogurts tend to be loaded with carbohydrates.  Remember, in food advertising, the word "energy" is synonymous for " carbohydrate."  Lunch:   A Sandwich using the bread choices listed, Can use any  Eggs,  lunchmeat, grilled meat or canned tuna), avocado, regular mayo/mustard  and  cheese.  A Salad using blue cheese, ranch,  Goddess or vinagrette,  No croutons or "confetti" and no "candied nuts" but regular nuts OK.   No pretzels or chips.  Pickles and miniature sweet peppers are a good low carb alternative that provide a "crunch"  The bread is the only source of carbohydrate in a sandwich and  can be decreased by trying some of these alternatives to traditional loaf bread  Joseph's makes a pita bread and a flat bread that are 50 cal and 4 net carbs available at [place] and [place].  This can be toasted to use with hummous as well  Toufayan makes a low carb flatbread that's 100 cal and 9 net carbs available at [employer] and [employer]'s makes 2 sizes of  Low carb whole wheat tortilla  (The large one is 210 cal and 6 net carbs) Avoid "Low fat dressings, as well as Barry Brunner and [place] dressings They are loaded with sugar!   3 PM/ Mid day  Snack:  Consider  1 ounce of  almonds, walnuts, pistachios, pecans, peanuts,  Macadamia nuts or a nut medley.  Avoid "granola"; the dried cranberries and raisins are loaded  with carbohydrates. Mixed nuts as long as there are no raisins,  cranberries or dried fruit.     6 PM  Dinner:     Meat/fowl/fish with a green salad, and either broccoli, cauliflower, green beans, spinach, brussel sprouts or  Lima beans. DO NOT BREAD THE PROTEIN!!      There is a low carb pasta by Dreamfield's that is acceptable and tastes great: only 5 digestible carbs/serving.( All grocery stores but BJs carry it )  Try Hurley Cisco Angelo's chicken piccata or chicken or eggplant parm over low carb pasta.(Lowes and BJs)   Marjory Lies Sanchez's "Carnitas" (pulled pork, no sauce,  0 carbs) or his beef pot roast to make a dinner burrito (at [employer]J's)  Pesto over low carb pasta (bj's sells a good quality pesto in the center refrigerated section of the deli   Whole wheat pasta is still full of digestible carbs and  Not as low in glycemic index as Dreamfield's.   Brown rice is  still rice,  So skip the rice and noodles if you eat [place] or [place] (or at least limit to 1/2 cup)  9 PM snack :   Breyer's "low carb" fudgsicle or  ice cream bar (Carb Smart line), or  Weight Watcher's ice cream bar , or another "no sugar added" ice cream;  a serving of fresh berries/cherries with whipped cream   Cheese or DANNON'S LlGHT N FIT [place] YOGURT  Avoid bananas, pineapple, grapes  and watermelon on a regular basis because they are high in sugar.  THINK OF THEM AS DESSERT  Remember that snack Substitutions should be less than 10 NET carbs per serving and meals < 20 carbs. Remember to subtract fiber grams to get the "net carbs."

## 2013-12-13 NOTE — Progress Notes (Signed)
Pre-visit discussion using our clinic review tool. No additional management support is needed unless otherwise documented below in the visit note.  

## 2013-12-13 NOTE — Progress Notes (Signed)
Patient ID: Sheri [name], female   DOB: 10-26-1955, 58 y.o.   MRN: [id number]  Patient Active Problem List   Diagnosis Date Noted  . Obesity, unspecified 05/13/2013  . Major depressive disorder, single episode 05/13/2013  . Reflux esophagitis 05/11/2013  . Sinus tachycardia 04/24/2012  . Chest pain at rest 04/19/2012  . Type II or unspecified type diabetes mellitus with neurological manifestations, not stated as uncontrolled 04/18/2012  . Motion sickness 10/29/2011  . Headache, chronic daily 05/17/2011  . Insomnia, psychophysiological 05/17/2011    Subjective:  CC:   Chief Complaint  Patient presents with  . Follow-up    3 month  . Diabetes    HPI:   Sheri [name] is a 58 y.o. female who presents for Follow up on chronic conditions including Depression/anxiety,  Diet controlled diabetes, insomnia.     Her anxiety has improved since she resumed paxil. She is using alprazolam less frequently but still requiring use of ambien for insomnia.   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   She has been having right shoulder pain since February, started with some lifting.  Shoulder felt like it was going out of the socket ,  Describes  pain at top of shoulder with adduction and abduction . Aggravated by lying on her right side saw a massage therapist for it    Past Medical History  Diagnosis Date  . Hypertension   . Unspecified hereditary and idiopathic peripheral neuropathy   . Diabetes mellitus, type 2   . Osteopenia   . Unspecified sleep apnea     sleep study- [facility]  . Reflux esophagitis   . Diverticulosis of colon without hemorrhage   . Rash and other nonspecific skin eruption   . Personal history of malignant neoplasm of cervix uteri     CIN-3 1989, normal since, gets pap smears annually  . Personal  history of other genital system and obstetric disorders(V13.29)   . Abdominal pain, epigastric   . Panic disorder without agoraphobia   . Duodenitis with hemorrhage   . Edema   . Obesity, unspecified   . Hyperlipidemia   . Heart murmur   . Mitral regurgitation     Past Surgical History  Procedure Laterality Date  . Right oophorectomy  2001    menopause, endometriosis s/p.  HRT  since- has not tolerated previous attempts to wean  . Breast cyst aspiration      right , benign       The following portions of the patient's history were reviewed and updated as appropriate: Allergies, current medications, and problem list.    Review of Systems:   Patient denies headache, fevers, malaise, unintentional weight loss, skin rash, eye pain, sinus congestion and sinus pain, sore throat, dysphagia,  hemoptysis , cough, dyspnea, wheezing, chest pain, palpitations, orthopnea, edema, abdominal pain, nausea, melena, diarrhea, constipation, flank pain, dysuria, hematuria, urinary  Frequency, nocturia, numbness, tingling, seizures,  Focal weakness, Loss of consciousness,  Tremor, insomnia, depression, anxiety, and suicidal ideation.     History   Social History  . Marital Status: Married    Spouse Name: N/A    Number of Children: N/A  . Years of Education: N/A   Occupational History  . 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  Smoking status: Never Smoker   . Smokeless tobacco: Never Used  . Alcohol Use: Yes     Comment: rare  . Drug Use: No  . Sexual Activity: Not on file   Other Topics Concern  . Not on file   Social History Narrative   Lives with spouse but the two are emotionally estranged due to spouse's infidelity.          Objective:  Filed Vitals:   12/13/13 1053  BP: 110/78  Pulse: 93  Temp: 98.1 F (36.7 C)  Resp: 16     General appearance: alert, cooperative and appears stated age Ears: normal TM's and external ear canals both  ears Throat: lips, mucosa, and tongue normal; teeth and gums normal Neck: no adenopathy, no carotid bruit, supple, symmetrical, trachea midline and thyroid not enlarged, symmetric, no tenderness/mass/nodules Back: symmetric, no curvature. ROM normal. No CVA tenderness. Lungs: clear to auscultation bilaterally Heart: regular rate and rhythm, S1, S2 normal, no murmur, click, rub or gallop Abdomen: soft, non-tender; bowel sounds normal; no masses,  no organomegaly Pulses: 2+ and symmetric Skin: Skin color, texture, turgor normal. No rashes or lesions Lymph nodes: Cervical, supraclavicular, and axillary nodes normal.  Assessment and Plan:  Type II or unspecified type diabetes mellitus with neurological manifestations, not stated as uncontrolled Well-controlled on diet alone .  hemoglobin A1c has been consistently less than 7.0 . Patient is up-to-date on eye exams and foot exam was done today.  There is  no proteinuria on today's micro urinalysis .  Fasting lipids have been reviewed and statin therapy has been advised and updated according to new ACC guidelines based on patient's 10 year risk of CAD .  Gabapentin dose has been increased to tid. With trial of lyrica offered if no improvement     Obesity, unspecified She has lost 4-6 pounds over the last several weeks using Weight Watchers program. She has not started exercising yet but does have a coworker who is encouraging her to start going with him. I have strongly encouraged her to use the buddy system to help want her exercise efforts.    Major depressive disorder, single episode Improved with resuming paxil.  She continues to cite significant stressors but is managing her mood disorder with less use of alprazolam. No changes today/    Updated Medication List Outpatient Encounter Prescriptions as of 12/13/2013  Medication Sig  . ALPRAZolam (XANAX) 0.5 MG tablet Take 1 tablet (0.5 mg total) by mouth 2 (two) times daily as needed for  anxiety or sleep.  Marland Kitchen estrogen, conjugated,-medroxyprogesterone (PREMPRO) 0.3-1.5 MG per tablet Take 1 tablet by mouth daily.  . furosemide (LASIX) 20 MG tablet Take 1 tablet (20 mg total) by mouth daily. Take one daily as needed for fluid retention  . gabapentin (NEURONTIN) 100 MG capsule Take 2 capsules (200 mg total) by mouth 3 (three) times daily.  Marland Kitchen glucose blood (ONE TOUCH TEST STRIPS) test strip Use as instructed   . lisinopril-hydrochlorothiazide (PRINZIDE,ZESTORETIC) 20-25 MG per tablet Take 1 tablet by mouth daily.  . pantoprazole (PROTONIX) 40 MG tablet Take 1 tablet by mouth daily.  Marland Kitchen PARoxetine (PAXIL-CR) 12.5 MG 24 hr tablet Take 1 tablet by mouth every morning.  . zolpidem (AMBIEN) 10 MG tablet Take 1 tablet (10 mg total) by mouth at bedtime as needed for sleep.  . [DISCONTINUED] ALPRAZolam (XANAX) 0.5 MG tablet Take 1 tablet (0.5 mg total) by mouth 2 (two) times daily as needed for anxiety or sleep.  . [  DISCONTINUED] gabapentin (NEURONTIN) 100 MG capsule Take 1 capsule (100 mg total) by mouth 3 (three) times daily.  Marland Kitchen atorvastatin (LIPITOR) 20 MG tablet Take 1 tablet (20 mg total) by mouth daily.  . meloxicam (MOBIC) 15 MG tablet Take 1 tablet (15 mg total) by mouth daily.     Orders Placed This Encounter  Procedures  . Ambulatory referral to Orthopedic Surgery  . HM DIABETES EYE EXAM    Return in about 3 months (around 03/15/2014) for follow up diabetes.

## 2013-12-16 ENCOUNTER — Encounter: Payer: Self-pay | Admitting: Internal Medicine

## 2013-12-16 NOTE — Assessment & Plan Note (Addendum)
Well-controlled on diet alone .  hemoglobin A1c has been consistently less than 7.0 . Patient is up-to-date on eye exams and foot exam was done today.  There is  no proteinuria on today's micro urinalysis .  Fasting lipids have been reviewed and statin therapy has been advised and updated according to new ACC guidelines based on patient's 10 year risk of CAD .  Gabapentin dose has been increased to tid. With trial of lyrica offered if no improvement      

## 2013-12-16 NOTE — Assessment & Plan Note (Signed)
Improved with resuming paxil.  She continues to cite significant stressors but is managing her mood disorder with less use of alprazolam. No changes today/

## 2013-12-16 NOTE — Assessment & Plan Note (Signed)
She has lost 4-6 pounds over the last several weeks using Weight Watchers program. She has not started exercising yet but does have a coworker who is encouraging her to start going with him. I have strongly encouraged her to use the buddy system to help want her exercise efforts.

## 2014-01-23 ENCOUNTER — Other Ambulatory Visit: Payer: Self-pay | Admitting: Internal Medicine

## 2014-02-15 ENCOUNTER — Telehealth: Payer: Self-pay | Admitting: Internal Medicine

## 2014-02-15 ENCOUNTER — Ambulatory Visit (INDEPENDENT_AMBULATORY_CARE_PROVIDER_SITE_OTHER): Payer: 59 | Admitting: Internal Medicine

## 2014-02-15 ENCOUNTER — Encounter: Payer: Self-pay | Admitting: Internal Medicine

## 2014-02-15 VITALS — BP 150/94 | HR 97 | Temp 98.1°F | Resp 18 | Ht 63.0 in | Wt 232.5 lb

## 2014-02-15 DIAGNOSIS — R609 Edema, unspecified: Secondary | ICD-10-CM

## 2014-02-15 DIAGNOSIS — M489 Spondylopathy, unspecified: Secondary | ICD-10-CM

## 2014-02-15 DIAGNOSIS — M5380 Other specified dorsopathies, site unspecified: Secondary | ICD-10-CM

## 2014-02-15 DIAGNOSIS — G589 Mononeuropathy, unspecified: Secondary | ICD-10-CM

## 2014-02-15 DIAGNOSIS — G629 Polyneuropathy, unspecified: Secondary | ICD-10-CM

## 2014-02-15 DIAGNOSIS — E1149 Type 2 diabetes mellitus with other diabetic neurological complication: Secondary | ICD-10-CM

## 2014-02-15 DIAGNOSIS — M4698 Unspecified inflammatory spondylopathy, sacral and sacrococcygeal region: Secondary | ICD-10-CM

## 2014-02-15 MED ORDER — POTASSIUM CHLORIDE CRYS ER 20 MEQ PO TBCR: 20.0000 meq | EXTENDED_RELEASE_TABLET | Freq: Every day | ORAL | Status: DC

## 2014-02-15 MED ORDER — PREGABALIN 75 MG PO CAPS: 75.0000 mg | ORAL_CAPSULE | Freq: Three times a day (TID) | ORAL | Status: DC

## 2014-02-15 NOTE — Telephone Encounter (Signed)
FYI

## 2014-02-15 NOTE — Progress Notes (Signed)
Patient ID: Sheri [name], female   DOB: 1955/12/01, 58 y.o.   MRN: [id number]  Patient Active Problem List   Diagnosis Date Noted  . Edema 02/17/2014  . Obesity, unspecified 05/13/2013  . Major depressive disorder, single episode 05/13/2013  . Reflux esophagitis 05/11/2013  . Sinus tachycardia 04/24/2012  . Chest pain at rest 04/19/2012  . Type II or unspecified type diabetes mellitus with neurological manifestations, not stated as uncontrolled 04/18/2012  . Motion sickness 10/29/2011  . Headache, chronic daily 05/17/2011  . Insomnia, psychophysiological 05/17/2011    Subjective:  CC:   Chief Complaint  Patient presents with  . Foot Swelling    bilateral and hands swelling    HPI:   Sheri [name] is a 58 y.o. female who presents for  Leg pain and edema bilaterally.  Takes gabapentin for neuropathic pain involving legs.  Has increased dose to 600 mg  Tid with no imporvement  Now fluid retention is worse   Past Medical History  Diagnosis Date  . Hypertension   . Unspecified hereditary and idiopathic peripheral neuropathy   . Diabetes mellitus, type 2   . Osteopenia   . Unspecified sleep apnea     sleep study- [facility]  . Reflux esophagitis   . Diverticulosis of colon without hemorrhage   . Rash and other nonspecific skin eruption   . Personal history of malignant neoplasm of cervix uteri     CIN-3 1989, normal since, gets pap smears annually  . Personal history of other genital system and obstetric disorders(V13.29)   . Abdominal pain, epigastric   . Panic disorder without agoraphobia   . Duodenitis with hemorrhage   . Edema   . Obesity, unspecified   . Hyperlipidemia   . Heart murmur   . Mitral regurgitation     Past Surgical History  Procedure Laterality Date  . Right oophorectomy  2001    menopause, endometriosis s/p.  HRT  since- has not tolerated previous attempts to wean  . Breast cyst aspiration      right , benign       The following portions of  the patient's history were reviewed and updated as appropriate: Allergies, current medications, and problem list.    Review of Systems:   Patient denies headache, fevers, malaise, unintentional weight loss, skin rash, eye pain, sinus congestion and sinus pain, sore throat, dysphagia,  hemoptysis , cough, dyspnea, wheezing, chest pain, palpitations, orthopnea, edema, abdominal pain, nausea, melena, diarrhea, constipation, flank pain, dysuria, hematuria, urinary  Frequency, nocturia, numbness, tingling, seizures,  Focal weakness, Loss of consciousness,  Tremor, insomnia, depression, anxiety, and suicidal ideation.     History   Social History  . Marital Status: Married    Spouse Name: N/A    Number of Children: N/A  . Years of Education: N/A   Occupational History  . 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 Smoking status: Never Smoker   . Smokeless tobacco: Never Used  . Alcohol Use: Yes     Comment: rare  . Drug Use: No  . Sexual Activity: Not on file   Other Topics Concern  . Not on file   Social History Narrative   Lives with spouse but the two are emotionally estranged due to spouse's infidelity.          Objective:  Filed Vitals:   02/15/14 1546  BP: 150/94  Pulse: 97  Temp: 98.1 F (36.7 C)  Resp:  18     General appearance: alert, cooperative and appears stated age Ears: normal TM's and external ear canals both ears Throat: lips, mucosa, and tongue normal; teeth and gums normal Neck: no adenopathy, no carotid bruit, supple, symmetrical, trachea midline and thyroid not enlarged, symmetric, no tenderness/mass/nodules Back: symmetric, no curvature. ROM normal. No CVA tenderness. Lungs: clear to auscultation bilaterally Heart: regular rate and rhythm, S1, S2 normal, no murmur, click, rub or gallop Abdomen: soft, non-tender; bowel sounds normal; no masses,  no organomegaly Pulses: 2+ and symmetric Skin: Skin color, texture, turgor  poor, with 1+ pittinf edema to hight ibia . No rashes or lesions Lymph nodes: Cervical, supraclavicular, and axillary nodes normal.  Assessment and Plan:  Edema Likely secondary to gabapentin.  Increased furosemide to 40 daily   Type II or unspecified type diabetes mellitus with neurological manifestations, not stated as uncontrolled Well-controlled on diet alone .  hemoglobin A1c has been consistently less than 7.0 . Patient is up-to-date on eye exams and foot exam was done today.  There is  no proteinuria on today's micro urinalysis .  Fasting lipids have been reviewed and statin therapy has been advised and updated according to new ACC guidelines based on patient's 10 year risk of CAD .  Gabapentin dose has been increased to tid. With trial of lyrica offered if no improvement        Updated Medication List Outpatient Encounter Prescriptions as of 02/15/2014  Medication Sig  . ALPRAZolam (XANAX) 0.5 MG tablet Take 1 tablet (0.5 mg total) by mouth 2 (two) times daily as needed for anxiety or sleep.  Marland Kitchen atorvastatin (LIPITOR) 20 MG tablet Take 1 tablet (20 mg total) by mouth daily.  Marland Kitchen estrogen, conjugated,-medroxyprogesterone (PREMPRO) 0.3-1.5 MG per tablet Take 1 tablet by mouth daily.  . furosemide (LASIX) 20 MG tablet Take 1 tablet (20 mg total) by mouth daily. Take one daily as needed for fluid retention  . glucose blood (ONE TOUCH TEST STRIPS) test strip Use as instructed   . lisinopril-hydrochlorothiazide (PRINZIDE,ZESTORETIC) 20-25 MG per tablet Take 1 tablet by mouth daily.  . meloxicam (MOBIC) 15 MG tablet Take 1 tablet (15 mg total) by mouth daily.  . pantoprazole (PROTONIX) 40 MG tablet Take 1 tablet by mouth daily.  Marland Kitchen PARoxetine (PAXIL-CR) 12.5 MG 24 hr tablet Take 1 tablet by mouth every morning.  . zolpidem (AMBIEN) 10 MG tablet Take 1 tablet (10 mg total) by mouth at bedtime as needed for sleep.  . [DISCONTINUED] gabapentin (NEURONTIN) 100 MG capsule Take 2 capsules (200 mg  total) by mouth 3 (three) times daily.  . potassium chloride SA (K-DUR,KLOR-CON) 20 MEQ tablet Take 1 tablet (20 mEq total) by mouth daily.  . pregabalin (LYRICA) 75 MG capsule Take 1 capsule (75 mg total) by mouth 3 (three) times daily.     Orders Placed This Encounter  Procedures  . DME Other see comment  . NCV with EMG(electromyography)    No Follow-up on file.

## 2014-02-15 NOTE — Progress Notes (Signed)
Pre-visit discussion using our clinic review tool. No additional management support is needed unless otherwise documented below in the visit note.  

## 2014-02-15 NOTE — Patient Instructions (Addendum)
Increase the furosemide to 40 mg daily in the morning until the fluid retention resolves  If you can afford the lyrica ,  It will replace the gabapentin    Referral to Neurology for nerve conduction studies.  This is  One version of a  "Low GI"  Diet:  It will still lower your blood sugars and allow you to lose 4 to 8  lbs  per month if you follow it carefully.  Your goal with exercise is a minimum of 30 minutes of aerobic exercise 5 days per week (Walking does not count once it becomes easy!)    All of the foods can be found at grocery stores and in bulk at [employer].  The Atkins protein bars and shakes are available in more varieties at [employer], [employer] and [place].     7 AM Breakfast:  Choose from the following:  Low carbohydrate Protein  Shakes (I recommend the [employer]AS AdvantEdge "Carb Control" shakes  Or the low carb shakes by Atkins.    2.5 carbs   Arnold's "Sandwhich Thin"toasted  w/ peanut butter (no jelly: about 20 net carbs  "Bagel Thin" with cream cheese and salmon: about 20 carbs   a scrambled egg/bacon/cheese burrito made with Mission's "carb balance" whole wheat tortilla  (about 10 net carbs )   Avoid cereal and bananas, oatmeal and cream of wheat and grits. They are loaded with carbohydrates!   10 AM: high protein snack  Protein bar by Atkins (the snack size, under 200 cal, usually < 6 net carbs).    A stick of cheese:  Around 1 carb,  100 cal     Dannon Light n Fit [place] Yogurt  (80 cal, 8 carbs)  Other so called "protein bars" and Greek yogurts tend to be loaded with carbohydrates.  Remember, in food advertising, the word "energy" is synonymous for " carbohydrate."  Lunch:   A Sandwich using the bread choices listed, Can use any  Eggs,  lunchmeat, grilled meat or canned tuna), avocado, regular mayo/mustard  and cheese.  A Salad using blue cheese, ranch,  Goddess or vinagrette,  No croutons or "confetti" and no "candied nuts" but regular nuts OK.   No pretzels or chips.   Pickles and miniature sweet peppers are a good low carb alternative that provide a "crunch"  The bread is the only source of carbohydrate in a sandwich and  can be decreased by trying some of these alternatives to traditional loaf bread  Joseph's makes a pita bread and a flat bread that are 50 cal and 4 net carbs available at [place] and [place].  This can be toasted to use with hummous as well  Toufayan makes a low carb flatbread that's 100 cal and 9 net carbs available at [employer] and [employer]'s makes 2 sizes of  Low carb whole wheat tortilla  (The large one is 210 cal and 6 net carbs) Avoid "Low fat dressings, as well as Barry Brunner and [place] dressings They are loaded with sugar!   3 PM/ Mid day  Snack:  Consider  1 ounce of  almonds, walnuts, pistachios, pecans, peanuts,  Macadamia nuts or a nut medley.  Avoid "granola"; the dried cranberries and raisins are loaded with carbohydrates. Mixed nuts as long as there are no raisins,  cranberries or dried fruit.    Try the prosciutto/mozzarella cheese sticks by Fiorruci  In deli /backery section   High protein      6  PM  Dinner:     Meat/fowl/fish with a green salad, and either broccoli, cauliflower, green beans, spinach, brussel sprouts or  Lima beans. DO NOT BREAD THE PROTEIN!!      There is a low carb pasta by Dreamfield's that is acceptable and tastes great: only 5 digestible carbs/serving.( All grocery stores but BJs carry it )  Try Hurley Cisco Angelo's chicken piccata or chicken or eggplant parm over low carb pasta.(Lowes and BJs)   Marjory Lies Sanchez's "Carnitas" (pulled pork, no sauce,  0 carbs) or his beef pot roast to make a dinner burrito (at [employer]J's)  Pesto over low carb pasta (bj's sells a good quality pesto in the center refrigerated section of the deli   Try satueeing  Cheral Marker with mushroooms  Whole wheat pasta is still full of digestible carbs and  Not as low in glycemic index as Dreamfield's.   Brown rice is still rice,  So skip the  rice and noodles if you eat [place] or [place] (or at least limit to 1/2 cup)  9 PM snack :   Breyer's "low carb" fudgsicle or  ice cream bar (Carb Smart line), or  Weight Watcher's ice cream bar , or another "no sugar added" ice cream;  a serving of fresh berries/cherries with whipped cream   Cheese or DANNON'S LlGHT N FIT GREEK YOGURT  8 ounces of [place] unsweetened almond/cococunut milk    Avoid bananas, pineapple, grapes  and watermelon on a regular basis because they are high in sugar.  THINK OF THEM AS DESSERT  Remember that snack Substitutions should be less than 10 NET carbs per serving and meals < 20 carbs. Remember to subtract fiber grams to get the "net carbs."

## 2014-02-15 NOTE — Telephone Encounter (Signed)
Patient Information:  Caller Name: [facility]  Phone: [phone]  Patient: Sheri [name], Sheri [name]  Gender: Female  DOB: 12-18-1955  Age: 58 Years  PCP: Deborra Medina (Adults only)  Office Follow Up:  Does the office need to follow up with this patient?: No  Instructions For The Office: N/A  RN Note:  Appointment has been scheduled 3:30pm in office. Advised patient to keep currently scheduled appointment. Patient verbalized understanding.  Symptoms  Reason For Call & Symptoms: Being treated for neuropathy in the lower extremities. Reports burning and swelling to the feet. Is not able to put on shoes at times. Has been having trouble sleeping due to burning pain. Reports she is able to stand and ambulate at this time without assistance.  Reviewed Health History In EMR: Yes  Reviewed Medications In EMR: Yes  Reviewed Allergies In EMR: Yes  Reviewed Surgeries / Procedures: Yes  Date of Onset of Symptoms: 01/18/2014  Treatments Tried: Elevate and prop up feet at times, walk at times during the day  Treatments Tried Worked: No  Guideline(s) Used:  Leg Swelling and Edema  Disposition Per Guideline:   See Today in Office  Reason For Disposition Reached:   Moderate swelling of both ankles (e.g., swelling extends up to the knees) AND new onset or worsening  Advice Given:  N/A  Patient Will Follow Care Advice:  YES

## 2014-02-17 DIAGNOSIS — R609 Edema, unspecified: Secondary | ICD-10-CM | POA: Insufficient documentation

## 2014-02-17 NOTE — Assessment & Plan Note (Signed)
Likely secondary to gabapentin.  Increased furosemide to 40 daily

## 2014-02-17 NOTE — Assessment & Plan Note (Signed)
Well-controlled on diet alone .  hemoglobin A1c has been consistently less than 7.0 . Patient is up-to-date on eye exams and foot exam was done today.  There is  no proteinuria on today's micro urinalysis .  Fasting lipids have been reviewed and statin therapy has been advised and updated according to new ACC guidelines based on patient's 10 year risk of CAD .  Gabapentin dose has been increased to tid. With trial of lyrica offered if no improvement

## 2014-02-26 ENCOUNTER — Encounter: Payer: Self-pay | Admitting: Internal Medicine

## 2014-02-26 ENCOUNTER — Ambulatory Visit (INDEPENDENT_AMBULATORY_CARE_PROVIDER_SITE_OTHER): Payer: 59 | Admitting: Internal Medicine

## 2014-02-26 VITALS — BP 114/72 | HR 93 | Temp 98.3°F | Resp 16 | Ht 63.0 in | Wt 221.5 lb

## 2014-02-26 DIAGNOSIS — I10 Essential (primary) hypertension: Secondary | ICD-10-CM | POA: Insufficient documentation

## 2014-02-26 DIAGNOSIS — E669 Obesity, unspecified: Secondary | ICD-10-CM

## 2014-02-26 DIAGNOSIS — R609 Edema, unspecified: Secondary | ICD-10-CM

## 2014-02-26 DIAGNOSIS — E1149 Type 2 diabetes mellitus with other diabetic neurological complication: Secondary | ICD-10-CM

## 2014-02-26 LAB — HM DIABETES FOOT EXAM: HM DIABETIC FOOT EXAM: NORMAL

## 2014-02-26 MED ORDER — ALPRAZOLAM 0.5 MG PO TABS: 0.5000 mg | ORAL_TABLET | Freq: Two times a day (BID) | ORAL | Status: DC | PRN

## 2014-02-26 NOTE — Progress Notes (Signed)
Patient ID: Sheri [name], female   DOB: 1956-07-14, 58 y.o.   MRN: [id number]   Patient Active Problem List   Diagnosis Date Noted  . Essential hypertension, benign 02/26/2014  . Edema 02/17/2014  . Obesity, unspecified 05/13/2013  . Major depressive disorder, single episode 05/13/2013  . Reflux esophagitis 05/11/2013  . Sinus tachycardia 04/24/2012  . Chest pain at rest 04/19/2012  . Type II or unspecified type diabetes mellitus with neurological manifestations, not stated as uncontrolled 04/18/2012  . Motion sickness 10/29/2011  . Headache, chronic daily 05/17/2011  . Insomnia, psychophysiological 05/17/2011    Subjective:  CC:   Chief Complaint  Patient presents with  . Follow-up    edema and weight loss    HPI:   Sheri [name] is a 58 y.o. female who presents for  Follow up on multiple issues raised at last visit one month ago including lower extremity edema,  chronic neuropathy managed with gabapentin, and obesity.    She has lost 12 lbs in 2 weeks by  following a low carbohydrate diet .  She stopped the gabapentin and used furosemide for 3 days until the edema resolved. The neuropathy has improved with lyrica which she is taking twice daily .  She feels significantly better .  She is in less pain and is motivated to continue weight loss.    Past Medical History  Diagnosis Date  . Hypertension   . Unspecified hereditary and idiopathic peripheral neuropathy   . Diabetes mellitus, type 2   . Osteopenia   . Unspecified sleep apnea     sleep study- [facility]  . Reflux esophagitis   . Diverticulosis of colon without hemorrhage   . Rash and other nonspecific skin eruption   . Personal history of malignant neoplasm of cervix uteri     CIN-3 1989, normal since, gets pap smears annually  . Personal history of other genital system and obstetric disorders(V13.29)   . Abdominal pain, epigastric   . Panic disorder without agoraphobia   . Duodenitis with hemorrhage   .  Edema   . Obesity, unspecified   . Hyperlipidemia   . Heart murmur   . Mitral regurgitation     Past Surgical History  Procedure Laterality Date  . Right oophorectomy  2001    menopause, endometriosis s/p.  HRT  since- has not tolerated previous attempts to wean  . Breast cyst aspiration      right , benign       The following portions of the patient's history were reviewed and updated as appropriate: Allergies, current medications, and problem list.    Review of Systems:   Patient denies headache, fevers, malaise, unintentional weight loss, skin rash, eye pain, sinus congestion and sinus pain, sore throat, dysphagia,  hemoptysis , cough, dyspnea, wheezing, chest pain, palpitations, orthopnea, edema, abdominal pain, nausea, melena, diarrhea, constipation, flank pain, dysuria, hematuria, urinary  Frequency, nocturia, numbness, tingling, seizures,  Focal weakness, Loss of consciousness,  Tremor, insomnia, depression, anxiety, and suicidal ideation.     History   Social History  . Marital Status: Married    Spouse Name: N/A    Number of Children: N/A  . Years of Education: N/A   Occupational History  . 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 Smoking status: Never Smoker   . Smokeless tobacco: Never Used  . Alcohol Use: Yes     Comment: rare  . Drug Use: No  .  Sexual Activity: Not on file   Other Topics Concern  . Not on file   Social History Narrative   Lives with spouse but the two are emotionally estranged due to spouse's infidelity.          Objective:  Filed Vitals:   02/26/14 0817  BP: 114/72  Pulse: 93  Temp: 98.3 F (36.8 C)  Resp: 16     General appearance: alert, cooperative and appears stated age Ears: normal TM's and external ear canals both ears Throat: lips, mucosa, and tongue normal; teeth and gums normal Neck: no adenopathy, no carotid bruit, supple, symmetrical, trachea midline and thyroid not enlarged,  symmetric, no tenderness/mass/nodules Back: symmetric, no curvature. ROM normal. No CVA tenderness. Lungs: clear to auscultation bilaterally Heart: regular rate and rhythm, S1, S2 normal, no murmur, click, rub or gallop Abdomen: soft, non-tender; bowel sounds normal; no masses,  no organomegaly Pulses: 2+ and symmetric Skin: Skin color, texture, turgor normal. No rashes or lesions Lymph nodes: Cervical, supraclavicular, and axillary nodes normal.  Assessment and Plan:  Obesity, unspecified I have congratulated her in reduction of   BMI and encouraged  Continued weight loss with goal of 10% of body weight over the next 6 months using a low glycemic index diet and regular exercise a minimum of 5 days per week.    Edema Resolved, secondary to gabapentin   Type II or unspecified type diabetes mellitus with neurological manifestations, not stated as uncontrolled Diet controlled.  Symptoms of neuropathy has improved with change in therapy from gabapentin and lyrica  Essential hypertension, benign Well controlled on current regimen. Renal function stable, no changes today.   Updated Medication List Outpatient Encounter Prescriptions as of 02/26/2014  Medication Sig  . ALPRAZolam (XANAX) 0.5 MG tablet Take 1 tablet (0.5 mg total) by mouth 2 (two) times daily as needed for anxiety or sleep.  Marland Kitchen atorvastatin (LIPITOR) 20 MG tablet Take 1 tablet (20 mg total) by mouth daily.  Marland Kitchen estrogen, conjugated,-medroxyprogesterone (PREMPRO) 0.3-1.5 MG per tablet Take 1 tablet by mouth daily.  . furosemide (LASIX) 20 MG tablet Take 1 tablet (20 mg total) by mouth daily. Take one daily as needed for fluid retention  . glucose blood (ONE TOUCH TEST STRIPS) test strip Use as instructed   . lisinopril-hydrochlorothiazide (PRINZIDE,ZESTORETIC) 20-25 MG per tablet Take 1 tablet by mouth daily.  . meloxicam (MOBIC) 15 MG tablet Take 1 tablet (15 mg total) by mouth daily.  . pantoprazole (PROTONIX) 40 MG tablet Take  1 tablet by mouth daily.  Marland Kitchen PARoxetine (PAXIL-CR) 12.5 MG 24 hr tablet Take 1 tablet by mouth every morning.  . potassium chloride SA (K-DUR,KLOR-CON) 20 MEQ tablet Take 1 tablet (20 mEq total) by mouth daily.  . pregabalin (LYRICA) 75 MG capsule Take 1 capsule (75 mg total) by mouth 3 (three) times daily.  Marland Kitchen zolpidem (AMBIEN) 10 MG tablet Take 1 tablet (10 mg total) by mouth at bedtime as needed for sleep.  . [DISCONTINUED] ALPRAZolam (XANAX) 0.5 MG tablet Take 1 tablet (0.5 mg total) by mouth 2 (two) times daily as needed for anxiety or sleep.     No orders of the defined types were placed in this encounter.    Return in about 4 weeks (around 03/26/2014).

## 2014-02-26 NOTE — Patient Instructions (Signed)
I am very pleased with your improvement !!  We are making no changes today to your medications   We will see you in a month   Have fun at the beach!!

## 2014-02-26 NOTE — Addendum Note (Signed)
Addended by: Crecencio Mc on: 02/26/2014 09:09 PM   Modules accepted: Orders

## 2014-02-26 NOTE — Assessment & Plan Note (Signed)
Diet controlled.  Symptoms of neuropathy has improved with change in therapy from gabapentin and lyrica

## 2014-02-26 NOTE — Assessment & Plan Note (Signed)
I have congratulated her in reduction of   BMI and encouraged  Continued weight loss with goal of 10% of body weight over the next 6 months using a low glycemic index diet and regular exercise a minimum of 5 days per week.     

## 2014-02-26 NOTE — Assessment & Plan Note (Signed)
Resolved, secondary to gabapentin

## 2014-02-26 NOTE — Assessment & Plan Note (Signed)
Well controlled on current regimen. Renal function stable, no changes today. 

## 2014-02-26 NOTE — Progress Notes (Signed)
Pre-visit discussion using our clinic review tool. No additional management support is needed unless otherwise documented below in the visit note.  

## 2014-03-07 ENCOUNTER — Telehealth: Payer: Self-pay | Admitting: Internal Medicine

## 2014-03-07 MED ORDER — FUROSEMIDE 20 MG PO TABS: 20.0000 mg | ORAL_TABLET | Freq: Every day | ORAL | Status: DC

## 2014-03-07 NOTE — Telephone Encounter (Signed)
Refill for furosemide sent as requested.

## 2014-03-20 ENCOUNTER — Ambulatory Visit (INDEPENDENT_AMBULATORY_CARE_PROVIDER_SITE_OTHER): Payer: 59 | Admitting: Internal Medicine

## 2014-03-20 ENCOUNTER — Encounter: Payer: Self-pay | Admitting: Internal Medicine

## 2014-03-20 VITALS — BP 110/82 | HR 105 | Resp 14 | Ht 63.0 in | Wt 225.5 lb

## 2014-03-20 DIAGNOSIS — E669 Obesity, unspecified: Secondary | ICD-10-CM

## 2014-03-20 DIAGNOSIS — E1149 Type 2 diabetes mellitus with other diabetic neurological complication: Secondary | ICD-10-CM

## 2014-03-20 LAB — MICROALBUMIN / CREATININE URINE RATIO
Creatinine,U: 16.7 mg/dL
MICROALB UR: 0.2 mg/dL (ref 0.0–1.9)
MICROALB/CREAT RATIO: 1.2 mg/g (ref 0.0–30.0)

## 2014-03-20 NOTE — Patient Instructions (Signed)
Hang in there!!  Pack a lunch box full of low carb snacks for your daytime 3 hr snacks   Consider trying the Fiorucci's  Meat/cheese snacks that [facility] has

## 2014-03-20 NOTE — Progress Notes (Signed)
Pre visit review using our clinic review tool, if applicable. No additional management support is needed unless otherwise documented below in the visit note. 

## 2014-03-20 NOTE — Progress Notes (Signed)
Patient ID: Sheri [name], female   DOB: [birth year], 58 y.o.   MRN: [id number]  Patient Active Problem List   Diagnosis Date Noted  . Essential hypertension, benign 02/26/2014  . Edema 02/17/2014  . Obesity, unspecified 05/13/2013  . Major depressive disorder, single episode 05/13/2013  . Reflux esophagitis 05/11/2013  . Sinus tachycardia 04/24/2012  . Chest pain at rest 04/19/2012  . Type II or unspecified type diabetes mellitus with neurological manifestations, not stated as uncontrolled 04/18/2012  . Motion sickness 10/29/2011  . Headache, chronic daily 05/17/2011  . Insomnia, psychophysiological 05/17/2011    Subjective:  CC:   Chief Complaint  Patient presents with  . Follow-up    3 month follow up DM    HPI:   Sheri [name] is a 58 y.o. female who presents for  1 month follow up on ower extremity edema, DM and obesity.    Her lower extremity edema resolved with suspension of gabapentin and has not recurred.  Her neuropathy remains controlled with lyrica.  DM:  Diet controlled historically.  She is due for a1c.  She has gained weight due to recent beach vacation and dietary noncompliance.   Obesity:   Reviewed weight records.  She has gained 29 lbs since August 2012 and BMI is approaching 40. She is not exercising regularly. She continues to cite her work schedule (she works 10 to 12 hour days at [employer]) and lack of emotional support from husband as obstacles to her having a more controlled diet and developing a daily routine that involves exercise.     Past Medical History  Diagnosis Date  . Hypertension   . Unspecified hereditary and idiopathic peripheral neuropathy   . Diabetes mellitus, type 2   . Osteopenia   . Unspecified sleep apnea     sleep study- [facility]  . Reflux esophagitis   . Diverticulosis of colon without hemorrhage   . Rash and other nonspecific skin eruption   . Personal history of malignant neoplasm of cervix uteri     CIN-3 1989, normal since,  gets pap smears annually  . Personal history of other genital system and obstetric disorders(V13.29)   . Abdominal pain, epigastric   . Panic disorder without agoraphobia   . Duodenitis with hemorrhage   . Edema   . Obesity, unspecified   . Hyperlipidemia   . Heart murmur   . Mitral regurgitation     Past Surgical History  Procedure Laterality Date  . Right oophorectomy  2001    menopause, endometriosis s/p.  HRT  since- has not tolerated previous attempts to wean  . Breast cyst aspiration      right , benign       The following portions of the patient's history were reviewed and updated as appropriate: Allergies, current medications, and problem list.    Review of Systems:   Patient denies headache, fevers, malaise, unintentional weight loss, skin rash, eye pain, sinus congestion and sinus pain, sore throat, dysphagia,  hemoptysis , cough, dyspnea, wheezing, chest pain, palpitations, orthopnea, edema, abdominal pain, nausea, melena, diarrhea, constipation, flank pain, dysuria, hematuria, urinary  Frequency, nocturia, numbness, tingling, seizures,  Focal weakness, Loss of consciousness,  Tremor, insomnia, depression, anxiety, and suicidal ideation.     History   Social History  . Marital Status: Married    Spouse Name: N/A    Number of Children: N/A  . Years of Education: N/A   Occupational History  . 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 Smoking status: Never Smoker   . Smokeless tobacco: Never Used  . Alcohol Use: Yes     Comment: rare  . Drug Use: No  . Sexual Activity: Not on file   Other Topics Concern  . Not on file   Social History Narrative   Lives with spouse but the two are emotionally estranged due to spouse's infidelity.          Objective:  Filed Vitals:   03/20/14 0807  BP: 110/82  Pulse: 105  Resp: 14     General appearance: alert, cooperative and appears stated age Ears: normal TM's and external ear  canals both ears Throat: lips, mucosa, and tongue normal; teeth and gums normal Neck: no adenopathy, no carotid bruit, supple, symmetrical, trachea midline and thyroid not enlarged, symmetric, no tenderness/mass/nodules Back: symmetric, no curvature. ROM normal. No CVA tenderness. Lungs: clear to auscultation bilaterally Heart: regular rate and rhythm, S1, S2 normal, no murmur, click, rub or gallop Abdomen: soft, non-tender; bowel sounds normal; no masses,  no organomegaly Pulses: 2+ and symmetric Skin: Skin color, texture, turgor normal. No rashes or lesions Lymph nodes: Cervical, supraclavicular, and axillary nodes normal. Foot exam:  Nails are well trimmed,  No callouses,  Sensation intact to microfilament   Assessment and Plan:  Type II or unspecified type diabetes mellitus with neurological manifestations, not stated as uncontrolled  Historically well-controlled on diet alone .  hemoglobin A1c has been consistently at or  less than 7.0 . Patient is up-to-date on eye exams and foot exam is normal today. Patient has no microalbuminuria. Patient is tolerating statin therapy for CAD risk reduction and on ACE/ARB for renal protection and hypertension   Lab Results  Component Value Date   HGBA1C 6.0 03/20/2014   Lab Results  Component Value Date   MICROALBUR 0.2 03/20/2014      Obesity, unspecified I have counselled her on her need for reduction of   BMI and encouraged weight loss with goal of 10% of body weight over the next 6 months using a low glycemic index diet and regular exercise a minimum of 5 days per week.      A total of 25 minutes was spent with patient more than half of which was spent in counselling her on diet and lifestyle , reviewing records and coordination of care.  Updated Medication List Outpatient Encounter Prescriptions as of 03/20/2014  Medication Sig  . ALPRAZolam (XANAX) 0.5 MG tablet Take 1 tablet (0.5 mg total) by mouth 2 (two) times daily as needed for  anxiety or sleep.  Marland Kitchen atorvastatin (LIPITOR) 20 MG tablet Take 1 tablet (20 mg total) by mouth daily.  Marland Kitchen estrogen, conjugated,-medroxyprogesterone (PREMPRO) 0.3-1.5 MG per tablet Take 1 tablet by mouth daily.  . furosemide (LASIX) 20 MG tablet Take 1 tablet (20 mg total) by mouth daily. Take one daily as needed for fluid retention  . glucose blood (ONE TOUCH TEST STRIPS) test strip Use as instructed   . lisinopril-hydrochlorothiazide (PRINZIDE,ZESTORETIC) 20-25 MG per tablet Take 1 tablet by mouth daily.  . meloxicam (MOBIC) 15 MG tablet Take 1 tablet (15 mg total) by mouth daily.  . pantoprazole (PROTONIX) 40 MG tablet Take 1 tablet by mouth daily.  Marland Kitchen PARoxetine (PAXIL-CR) 12.5 MG 24 hr tablet Take 1 tablet by mouth every morning.  . potassium chloride SA (K-DUR,KLOR-CON) 20 MEQ tablet Take 1 tablet (20 mEq total) by mouth daily.  . pregabalin (LYRICA) 75 MG capsule Take  1 capsule (75 mg total) by mouth 3 (three) times daily.  Marland Kitchen zolpidem (AMBIEN) 10 MG tablet Take 1 tablet (10 mg total) by mouth at bedtime as needed for sleep.     Orders Placed This Encounter  Procedures  . Comprehensive metabolic panel  . Hemoglobin A1c  . Lipid panel    No Follow-up on file.

## 2014-03-21 ENCOUNTER — Other Ambulatory Visit (INDEPENDENT_AMBULATORY_CARE_PROVIDER_SITE_OTHER): Payer: 59

## 2014-03-21 DIAGNOSIS — E1149 Type 2 diabetes mellitus with other diabetic neurological complication: Secondary | ICD-10-CM

## 2014-03-21 LAB — COMPREHENSIVE METABOLIC PANEL
ALBUMIN: 4.1 g/dL (ref 3.5–5.2)
ALK PHOS: 57 U/L (ref 39–117)
ALT: 28 U/L (ref 0–35)
AST: 24 U/L (ref 0–37)
BUN: 27 mg/dL — ABNORMAL HIGH (ref 6–23)
CO2: 25 meq/L (ref 19–32)
Calcium: 9.1 mg/dL (ref 8.4–10.5)
Chloride: 104 mEq/L (ref 96–112)
Creatinine, Ser: 0.7 mg/dL (ref 0.4–1.2)
GFR: 96.09 mL/min (ref >60.00)
GLUCOSE: 138 mg/dL — AB (ref 70–99)
POTASSIUM: 4 meq/L (ref 3.5–5.1)
SODIUM: 136 meq/L (ref 135–145)
TOTAL PROTEIN: 7.1 g/dL (ref 6.0–8.3)
Total Bilirubin: 0.7 mg/dL (ref 0.2–1.2)

## 2014-03-21 LAB — LIPID PANEL
Cholesterol: 143 mg/dL (ref 0–200)
HDL: 42.1 mg/dL (ref >39.00)
LDL Cholesterol: 79 mg/dL (ref 0–99)
NONHDL: 100.9
Total CHOL/HDL Ratio: 3
Triglycerides: 109 mg/dL (ref 0.0–149.0)
VLDL: 21.8 mg/dL (ref 0.0–40.0)

## 2014-03-21 LAB — HEMOGLOBIN A1C: Hgb A1c MFr Bld: 6 % (ref 4.6–6.5)

## 2014-03-22 ENCOUNTER — Encounter: Payer: Self-pay | Admitting: Internal Medicine

## 2014-03-22 NOTE — Assessment & Plan Note (Signed)
Historically well-controlled on diet alone .  hemoglobin A1c has been consistently at or  less than 7.0 . Patient is up-to-date on eye exams and foot exam is normal today. Patient has no microalbuminuria. Patient is tolerating statin therapy for CAD risk reduction and on ACE/ARB for renal protection and hypertension   Lab Results  Component Value Date   HGBA1C 6.0 03/20/2014   Lab Results  Component Value Date   MICROALBUR 0.2 03/20/2014    

## 2014-03-22 NOTE — Assessment & Plan Note (Signed)
I have counselled her on her need for reduction of   BMI and encouraged weight loss with goal of 10% of body weight over the next 6 months using a low glycemic index diet and regular exercise a minimum of 5 days per week.

## 2014-03-29 ENCOUNTER — Ambulatory Visit: Payer: 59 | Admitting: Internal Medicine

## 2014-04-16 ENCOUNTER — Other Ambulatory Visit: Payer: Self-pay | Admitting: Internal Medicine

## 2014-05-13 ENCOUNTER — Ambulatory Visit: Payer: Self-pay | Admitting: General Practice

## 2014-05-16 ENCOUNTER — Ambulatory Visit (INDEPENDENT_AMBULATORY_CARE_PROVIDER_SITE_OTHER): Payer: 59

## 2014-05-16 ENCOUNTER — Ambulatory Visit (INDEPENDENT_AMBULATORY_CARE_PROVIDER_SITE_OTHER): Payer: 59 | Admitting: Podiatry

## 2014-05-16 ENCOUNTER — Encounter: Payer: Self-pay | Admitting: Podiatry

## 2014-05-16 VITALS — BP 104/64 | HR 87 | Resp 16 | Ht 63.0 in | Wt 225.0 lb

## 2014-05-16 DIAGNOSIS — L601 Onycholysis: Secondary | ICD-10-CM

## 2014-05-16 DIAGNOSIS — M79674 Pain in right toe(s): Secondary | ICD-10-CM

## 2014-05-16 DIAGNOSIS — S99929A Unspecified injury of unspecified foot, initial encounter: Secondary | ICD-10-CM | POA: Insufficient documentation

## 2014-05-16 DIAGNOSIS — S90129A Contusion of unspecified lesser toe(s) without damage to nail, initial encounter: Secondary | ICD-10-CM

## 2014-05-16 DIAGNOSIS — T148XXA Other injury of unspecified body region, initial encounter: Secondary | ICD-10-CM | POA: Insufficient documentation

## 2014-05-16 DIAGNOSIS — S90121A Contusion of right lesser toe(s) without damage to nail, initial encounter: Secondary | ICD-10-CM

## 2014-05-16 DIAGNOSIS — L608 Other nail disorders: Secondary | ICD-10-CM

## 2014-05-16 DIAGNOSIS — M79609 Pain in unspecified limb: Secondary | ICD-10-CM

## 2014-05-16 MED ORDER — CEPHALEXIN 500 MG PO CAPS: 500.0000 mg | ORAL_CAPSULE | Freq: Three times a day (TID) | ORAL | Status: DC

## 2014-05-16 NOTE — Progress Notes (Signed)
Subjective:    Patient ID: Sheri [name], female    DOB: 1956-03-16, 58 y.o.   MRN: [id number]  HPI Comments: 58 year old female presents the office today with complaints of right big toe pain. She states that she dropped a callus on her toe on Saturday. She states that [facility] was told she had no fracture. She has been icing and elevating the toe. She states that she has noticed increasing edema and bruising to the hallux on the right. She is prediabetic and she states her last A1c was 6. He states that she has been getting tingling and numbness in her lower extermities for which she previously was placed on Lyrica. No other complaints at this time.     Review of Systems  All other systems reviewed and are negative.      Objective:   Physical Exam AAO x3, NAD DP/PT pulses palpable b/l. CRT < 3sec Are tested sensation intact with Dorann Ou monofilament. Vibratory sensation intact. Ecchymosis noted to the distal aspect of the right hallux with associated edema. The hallux toenail appears to be lifted at the proximal aspect underneath the skin with bulla formation along the proximal medial border. There is tenderness to palpation over the nail and distal hallux. Range of motion of the MPJ intact. No further areas of tenderness. MMT 5/5, ROM WNL (decreased R hallux IPJ 2/2 pain) No leg pain, swelling, warmth         Assessment & Plan:  58 year old female right hallux contusion, onychomycosis -X-rays were obtained and reviewed with the patient. No acute fractures. -Discussed surgical versus conservative treatment in detail including alternatives, risks, complications. -Due to to the nail lifting of the proximal aspect of the nail fold with what appears to be fluid underneath the nail recommended total nail avulsion. Patient agrees to procedure. The right hallux was anesthetized using 2.5 cc of a one-to-one mixture of 0.5%  Marcaine plain and 2% lidocaine plain. Area was prepped in normal sterile fashion the nail boarders were loosened and upon application of a Freer along the proximal nail border a large amount of serous fluid was expressed. The nail was removed in total. There is same as fluid was noted underneath the nail. The underlying skin was intact with no laceration or ulceration. Area was flushed and dressing applied. A tourniquet was used for the procedure and after application of the dressing it was removed and there is noted to be an immediate capillary refill time to the digit. -Post procedure instructions discussed with the patient in detail. -Keflex prescribed. -Surgical shoe (directed she can not drive with the shoe) -Monitor for any signs or symptoms of infection and patient's directed to call the office immediately or go directly to the emergency room if any are to occur. -Followup in one week or sooner if any problems are to arise.

## 2014-05-16 NOTE — Patient Instructions (Addendum)

## 2014-05-20 ENCOUNTER — Telehealth: Payer: Self-pay | Admitting: Podiatry

## 2014-05-20 NOTE — Telephone Encounter (Signed)
Called patient to follow up with her after the nail procedure last week. She states she has come discomfort still, and has been taking tylenol and mobic which seems to help. She still has swelling around the toe. Denies any fevers, chills, nausea, vomiting.  Follow up as scheduled or sooner if any questions or concerns or change in symptoms.

## 2014-05-23 ENCOUNTER — Ambulatory Visit (INDEPENDENT_AMBULATORY_CARE_PROVIDER_SITE_OTHER): Payer: 59 | Admitting: Podiatry

## 2014-05-23 VITALS — BP 115/75 | HR 112 | Resp 16

## 2014-05-23 DIAGNOSIS — S90129A Contusion of unspecified lesser toe(s) without damage to nail, initial encounter: Secondary | ICD-10-CM

## 2014-05-23 DIAGNOSIS — M79609 Pain in unspecified limb: Secondary | ICD-10-CM

## 2014-05-23 DIAGNOSIS — M79674 Pain in right toe(s): Secondary | ICD-10-CM

## 2014-05-23 DIAGNOSIS — S90121A Contusion of right lesser toe(s) without damage to nail, initial encounter: Secondary | ICD-10-CM

## 2014-05-23 NOTE — Progress Notes (Signed)
Patient ID: Tyjanae Bartek, female   DOB: 08-23-1956, 58 y.o.   MRN: [id number]  Subjective: 58 year old female returns the office they for followup evaluation status post right hallux nail avulsion, contusion to the right hallux after the injury. Patient states that she continues with Betadine soaks daily followed by an antibiotic ointment and a dressing. She denies any drainage from the area. Her pain has decreased. Denies any systemic complaints such as fevers, chills, nausea, vomiting. No new complaints at this time.  Objective: AAO x3, NAD DP/PT pulse palpable 2/4, CRT < 3sec Protective sensation intact. Right hallux with decreased ecchymosis, edema, pain. Mild amount of clear drainage from around the nailbed. No evidence of purulence. Mild amount of resolving erythema around the nail borders, no ascending cellulitis.  MMT 5/5, ROM WNL.   Assessment: 58 year old female status post right hallux total nail avulsion secondary to injury.  Plan: -Various treatment options discussed including alternatives, risks, complications. -Patient can switch to Epsom salt soaks twice a day followed by antibiotic ointment and a Band-Aid. - Continue a surgical shoe as needed. Discussed that she should not drive with the shoe.  -Finish course of abx.  -Monitor for any clinical signs or symptoms of infection and directed to call the office immediately if any are to occur or go directly to the emergency room. -Call with any questions or concerns or change in symptoms. Followup in 2 weeks.

## 2014-05-23 NOTE — Patient Instructions (Signed)
Can switch to epsom salt soaks twice a day followed by antibiotic ointment, band-aid Monitor for any signs/symptoms of infection. Call the office immediately if any occur or go directly to the emergency room. Call with any questions/concerns.

## 2014-05-24 ENCOUNTER — Other Ambulatory Visit: Payer: Self-pay | Admitting: Internal Medicine

## 2014-05-24 NOTE — Telephone Encounter (Signed)
Electronic Rx request. Is it ok to refill? Last office visit was 05/23/14.

## 2014-05-27 NOTE — Telephone Encounter (Signed)
Ok to refill,  Authorized in epic, please call in

## 2014-05-28 ENCOUNTER — Other Ambulatory Visit: Payer: Self-pay | Admitting: Internal Medicine

## 2014-05-28 NOTE — Telephone Encounter (Signed)
Last refill 8.10.15, last OV 7.1.15, next OV 10.2.15.  Please advise refill

## 2014-05-28 NOTE — Telephone Encounter (Signed)
Rx faxed to pharmacy  

## 2014-05-29 NOTE — Telephone Encounter (Signed)
Ok to refill,  printed rx  

## 2014-05-29 NOTE — Telephone Encounter (Signed)
Rx faxed to pharmacy  

## 2014-06-06 ENCOUNTER — Ambulatory Visit (INDEPENDENT_AMBULATORY_CARE_PROVIDER_SITE_OTHER): Payer: 59 | Admitting: Podiatry

## 2014-06-06 VITALS — BP 101/66 | HR 110 | Resp 16

## 2014-06-06 DIAGNOSIS — S90129A Contusion of unspecified lesser toe(s) without damage to nail, initial encounter: Secondary | ICD-10-CM

## 2014-06-06 DIAGNOSIS — S90121A Contusion of right lesser toe(s) without damage to nail, initial encounter: Secondary | ICD-10-CM

## 2014-06-06 NOTE — Progress Notes (Signed)
Patient ID: Sheri [name], female   DOB: [name] 29, 1956, 58 y.o.   MRN: [id number]  Subjective: Sheri [name] returns the office today for followup evaluation status post right hallux nail avulsion, contusion after the injury. She states that she no longer has any pain to the area. She denies any drainage or redness from the area. Denies any systemic complaints as fevers, chills, nausea, vomiting. No other complaints at this time.  Objective: AAO x3, NAD Neurovascular status unchanged. Right hallux status post total nail avulsion with nailbed completely healed at this time. No tenderness to palpation, no drainage. No surrounding erythema. No open lesions. MMT 5/5, ROM WNL  Assessment: 58 year old female status post right total nail avulsion of the hallux secondary to injury  Plan: -Treatment options discussed including alternatives, risks, complications. -At this time the site has completely healed and without discomfort. Can discontinue soaking at this time. Would continue the bandages for protection. -Discussed not to paint the site until callus is formed over or until new nail. -Followup as needed. Call any questions or concerns or change in symptoms.

## 2014-06-21 ENCOUNTER — Encounter: Payer: Self-pay | Admitting: Internal Medicine

## 2014-06-21 ENCOUNTER — Ambulatory Visit (INDEPENDENT_AMBULATORY_CARE_PROVIDER_SITE_OTHER): Payer: 59 | Admitting: Internal Medicine

## 2014-06-21 VITALS — BP 128/80 | HR 102 | Temp 98.8°F | Resp 16 | Ht 63.0 in | Wt 231.5 lb

## 2014-06-21 DIAGNOSIS — F5104 Psychophysiologic insomnia: Secondary | ICD-10-CM

## 2014-06-21 DIAGNOSIS — I10 Essential (primary) hypertension: Secondary | ICD-10-CM

## 2014-06-21 DIAGNOSIS — E1142 Type 2 diabetes mellitus with diabetic polyneuropathy: Secondary | ICD-10-CM

## 2014-06-21 DIAGNOSIS — G4700 Insomnia, unspecified: Secondary | ICD-10-CM

## 2014-06-21 MED ORDER — ZOLPIDEM TARTRATE ER 12.5 MG PO TBCR: 12.5000 mg | EXTENDED_RELEASE_TABLET | Freq: Every evening | ORAL | Status: DC | PRN

## 2014-06-21 MED ORDER — PHENTERMINE HCL 37.5 MG PO TABS: ORAL_TABLET | ORAL | Status: DC

## 2014-06-21 NOTE — Progress Notes (Signed)
Pre-visit discussion using our clinic review tool. No additional management support is needed unless otherwise documented below in the visit note.  

## 2014-06-21 NOTE — Progress Notes (Signed)
Patient ID: Sheri [name], female   DOB: 12/14/1955, 58 y.o.   MRN: [id number]  Patient Active Problem List   Diagnosis Date Noted  . Hematoma 05/16/2014  . Injury of toenail 05/16/2014  . Essential hypertension, benign 02/26/2014  . Edema 02/17/2014  . Morbid obesity 05/13/2013  . Major depressive disorder, single episode 05/13/2013  . Reflux esophagitis 05/11/2013  . Sinus tachycardia 04/24/2012  . Chest pain at rest 04/19/2012  . Type 2 DM with diabetic neuropathy affecting both sides of body 04/18/2012  . Headache, chronic daily 05/17/2011  . Insomnia, psychophysiological 05/17/2011    Subjective:  CC:   Chief Complaint  Patient presents with  . Follow-up  . Diabetes    HPI:   Sheri [name] is a 58 y.o. female who presents for 3 month follow up on chronic conditions including diet controlled DM, obesity, hyperlipidemia, hypertension, and depression with insomnia. She feels fatigue and frustrated.  She has been unable to make progress with weight loss due to an inability to devote attention to deit and regular exercise.  She has gained 10 lbs since her last visit and attributed a lot of it to the use of lyica for her neuropathy so she stoppped it.    She is not sleeping well despite daily use of ambien due to early wakening.   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  She is not on salary,  but feels compelled to stay until the work is finished daily since she has no assistance.      Past Medical History  Diagnosis Date  . Hypertension   . Unspecified hereditary and idiopathic peripheral neuropathy   . Diabetes mellitus, type 2   . Osteopenia   . Unspecified sleep apnea     sleep study- [facility]  . Reflux esophagitis   . Diverticulosis of colon without hemorrhage   . Rash and other nonspecific skin eruption   . Personal history of malignant neoplasm of  cervix uteri     CIN-3 1989, normal since, gets pap smears annually  . Personal history of other genital system and obstetric disorders(V13.29)   . Abdominal pain, epigastric   . Panic disorder without agoraphobia   . Duodenitis with hemorrhage   . Edema   . Obesity, unspecified   . Hyperlipidemia   . Heart murmur   . Mitral regurgitation     Past Surgical History  Procedure Laterality Date  . Right oophorectomy  2001    menopause, endometriosis s/p.  HRT  since- has not tolerated previous attempts to wean  . Breast cyst aspiration      right , benign       The following portions of the patient's history were reviewed and updated as appropriate: Allergies, current medications, and problem list.    Review of Systems:   Patient denies headache, fevers, malaise, unintentional weight loss, skin rash, eye pain, sinus congestion and sinus pain, sore throat, dysphagia,  hemoptysis , cough, dyspnea, wheezing, chest pain, palpitations, orthopnea, edema, abdominal pain, nausea, melena, diarrhea, constipation, flank pain, dysuria, hematuria, urinary  Frequency, nocturia, numbness, tingling, seizures,  Focal weakness, Loss of consciousness,  Tremor, insomnia, depression, anxiety, and suicidal ideation.     History   Social History  . Marital Status: Married    Spouse Name: N/A    Number of Children: N/A  . Years  of Education: N/A   Occupational History  . 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 Smoking status: Never Smoker   . Smokeless tobacco: Never Used  . Alcohol Use: Yes     Comment: rare  . Drug Use: No  . Sexual Activity: Not on file   Other Topics Concern  . Not on file   Social History Narrative   Lives with spouse but the two are emotionally estranged due to spouse's infidelity.          Objective:  Filed Vitals:   06/21/14 0807  BP: 128/80  Pulse: 102  Temp: 98.8 F (37.1 C)  Resp: 16     General appearance: alert,  cooperative and appears stated age Ears: normal TM's and external ear canals both ears Throat: lips, mucosa, and tongue normal; teeth and gums normal Neck: no adenopathy, no carotid bruit, supple, symmetrical, trachea midline and thyroid not enlarged, symmetric, no tenderness/mass/nodules Back: symmetric, no curvature. ROM normal. No CVA tenderness. Lungs: clear to auscultation bilaterally Heart: regular rate and rhythm, S1, S2 normal, no murmur, click, rub or gallop Abdomen: soft, non-tender; bowel sounds normal; no masses,  no organomegaly Pulses: 2+ and symmetric Skin: Skin color, texture, turgor normal. No rashes or lesions Lymph nodes: Cervical, supraclavicular, and axillary nodes normal.  Assessment and Plan:  Morbid obesity Body mass index is 41.02 kg/(m^2).Marland Kitchen  She has had difficulty losing weight due to increased appetite and is requesting a trial of  Phentermine.  She is aware of the possible side effects and risks and understands that    The medication will be discontinued if she has not lost 5% of her body weight over the next 3 months, which , based on today's weight is 12 lbs.   Type 2 DM with diabetic neuropathy affecting both sides of body  Historically well-controlled on diet alone .  hemoglobin A1c has been consistently at or  less than 7.0 . Patient is up-to-date on eye exams and foot exam is normal today. Patient has no microalbuminuria. Patient is tolerating statin therapy for CAD risk reduction and on ACE/ARB for renal protection and hypertension   Lab Results  Component Value Date   HGBA1C 6.0 03/20/2014   Lab Results  Component Value Date   MICROALBUR 0.2 03/20/2014        Insomnia, psychophysiological Reviewed principles of good sleep hygiene. Recommended nonpharmacologic adjumct therapy including exercise and avoidance of electornic devices and viewing of television for 2 hours prior to bedtime .  Trial of ambien CR  A total of 25 minutes of face to face time  was spent with patient more than half of which was spent in counselling and coordination of care   Updated Medication List Outpatient Encounter Prescriptions as of 06/21/2014  Medication Sig  . ALPRAZolam (XANAX) 0.5 MG tablet TAKE 1 TABLET BY MOUTH TWICE DAILY AS NEEDED FOR ANXIETY OR SLEEP  . atorvastatin (LIPITOR) 20 MG tablet Take 1 tablet (20 mg total) by mouth daily.  . furosemide (LASIX) 20 MG tablet Take 1 tablet (20 mg total) by mouth daily. Take one daily as needed for fluid retention  . glucose blood (ONE TOUCH TEST STRIPS) test strip Use as instructed   . lisinopril-hydrochlorothiazide (PRINZIDE,ZESTORETIC) 20-25 MG per tablet Take 1 tablet by mouth daily.  . meloxicam (MOBIC) 15 MG tablet Take 1 tablet (15 mg total) by mouth daily.  . pantoprazole (PROTONIX) 40 MG tablet TAKE 1 TABLET BY  MOUTH DAILY.  Marland Kitchen potassium chloride SA (K-DUR,KLOR-CON) 20 MEQ tablet Take 1 tablet (20 mEq total) by mouth daily.  Marland Kitchen PREMPRO 0.3-1.5 MG per tablet Take 1 tablet by mouth daily.  Marland Kitchen zolpidem (AMBIEN) 10 MG tablet TAKE ONE TABLET BY MOUTH AT BEDTIME AS NEEDED FOR SLEEP  . PARoxetine (PAXIL-CR) 12.5 MG 24 hr tablet Take 1 tablet by mouth every morning.  . phentermine (ADIPEX-P) 37.5 MG tablet 1/2 tablet in the am and early afternoon  . pregabalin (LYRICA) 75 MG capsule Take 1 capsule (75 mg total) by mouth 3 (three) times daily.  Marland Kitchen zolpidem (AMBIEN CR) 12.5 MG CR tablet Take 1 tablet (12.5 mg total) by mouth at bedtime as needed for sleep.  . [DISCONTINUED] cephALEXin (KEFLEX) 500 MG capsule Take 1 capsule (500 mg total) by mouth 3 (three) times daily.     No orders of the defined types were placed in this encounter.    Return in about 3 months (around 09/21/2014).

## 2014-06-21 NOTE — Patient Instructions (Addendum)
I have authorized the use of phentermine for 3 months.  Please have your vital signs checked a week after starting, and return to see me in 3 months.tkae 1`/2 tablet in the morning,  The second half by 2 PM to avoid insomnia If you have not lost 5% of your starting weight (12 lbs) by the end of the  3 months we will have to discontinue it.   Changing ambien to Danielson Cr to help you sleep through the night  I want you walking on a treadmill or an elliptiglider or stationery bike for 30 monutes Mon Wed and Friday  And weekends!!

## 2014-06-22 ENCOUNTER — Encounter: Payer: Self-pay | Admitting: Internal Medicine

## 2014-06-22 NOTE — Assessment & Plan Note (Signed)
Historically well-controlled on diet alone .  hemoglobin A1c has been consistently at or  less than 7.0 . Patient is up-to-date on eye exams and foot exam is normal today. Patient has no microalbuminuria. Patient is tolerating statin therapy for CAD risk reduction and on ACE/ARB for renal protection and hypertension   Lab Results  Component Value Date   HGBA1C 6.0 03/20/2014   Lab Results  Component Value Date   MICROALBUR 0.2 03/20/2014    

## 2014-06-22 NOTE — Assessment & Plan Note (Signed)
Reviewed principles of good sleep hygiene. Recommended nonpharmacologic adjumct therapy including exercise and avoidance of electornic devices and viewing of television for 2 hours prior to bedtime .  Trial of ambien CR

## 2014-06-22 NOTE — Assessment & Plan Note (Addendum)
Body mass index is 41.02 kg/(m^2).Marland Kitchen  She has had difficulty losing weight due to increased appetite and is requesting a trial of  Phentermine.  She is aware of the possible side effects and risks and understands that    The medication will be discontinued if she has not lost 5% of her body weight over the next 3 months, which , based on today's weight is 12 lbs.

## 2014-07-23 ENCOUNTER — Other Ambulatory Visit: Payer: Self-pay | Admitting: Internal Medicine

## 2014-07-25 ENCOUNTER — Other Ambulatory Visit: Payer: Self-pay | Admitting: Internal Medicine

## 2014-07-26 ENCOUNTER — Other Ambulatory Visit: Payer: Self-pay | Admitting: Internal Medicine

## 2014-07-26 NOTE — Telephone Encounter (Signed)
Last OV and refill 10.2.15.  Please advise refill

## 2014-07-26 NOTE — Telephone Encounter (Signed)
Rx faxed

## 2014-07-26 NOTE — Telephone Encounter (Signed)
Last OV and refill 10.2.15.  Please advise refill  Ok to refill,  Authorized in epic, printed and immediate release has been discontinued

## 2014-08-23 ENCOUNTER — Other Ambulatory Visit: Payer: Self-pay | Admitting: Internal Medicine

## 2014-09-23 ENCOUNTER — Ambulatory Visit (INDEPENDENT_AMBULATORY_CARE_PROVIDER_SITE_OTHER): Payer: 59 | Admitting: Internal Medicine

## 2014-09-23 ENCOUNTER — Encounter: Payer: Self-pay | Admitting: Internal Medicine

## 2014-09-23 VITALS — BP 128/82 | HR 91 | Temp 98.5°F | Resp 16 | Ht 63.0 in | Wt 207.5 lb

## 2014-09-23 DIAGNOSIS — E1142 Type 2 diabetes mellitus with diabetic polyneuropathy: Secondary | ICD-10-CM

## 2014-09-23 DIAGNOSIS — E669 Obesity, unspecified: Secondary | ICD-10-CM

## 2014-09-23 DIAGNOSIS — I471 Supraventricular tachycardia: Secondary | ICD-10-CM

## 2014-09-23 DIAGNOSIS — E785 Hyperlipidemia, unspecified: Secondary | ICD-10-CM

## 2014-09-23 DIAGNOSIS — Z23 Encounter for immunization: Secondary | ICD-10-CM

## 2014-09-23 DIAGNOSIS — R000 Tachycardia, unspecified: Secondary | ICD-10-CM

## 2014-09-23 DIAGNOSIS — E119 Type 2 diabetes mellitus without complications: Secondary | ICD-10-CM

## 2014-09-23 DIAGNOSIS — E1169 Type 2 diabetes mellitus with other specified complication: Secondary | ICD-10-CM

## 2014-09-23 DIAGNOSIS — I10 Essential (primary) hypertension: Secondary | ICD-10-CM

## 2014-09-23 LAB — LIPID PANEL
CHOLESTEROL: 206 mg/dL — AB (ref 0–200)
HDL: 40.6 mg/dL (ref >39.00)
LDL Cholesterol: 144 mg/dL — ABNORMAL HIGH (ref 0–99)
NonHDL: 165.4
TRIGLYCERIDES: 107 mg/dL (ref 0.0–149.0)
Total CHOL/HDL Ratio: 5
VLDL: 21.4 mg/dL (ref 0.0–40.0)

## 2014-09-23 LAB — COMPREHENSIVE METABOLIC PANEL
ALK PHOS: 50 U/L (ref 39–117)
ALT: 23 U/L (ref 0–35)
AST: 21 U/L (ref 0–37)
Albumin: 4.4 g/dL (ref 3.5–5.2)
BILIRUBIN TOTAL: 1 mg/dL (ref 0.2–1.2)
BUN: 30 mg/dL — ABNORMAL HIGH (ref 6–23)
CO2: 20 mEq/L (ref 19–32)
Calcium: 9.3 mg/dL (ref 8.4–10.5)
Chloride: 111 mEq/L (ref 96–112)
Creatinine, Ser: 0.6 mg/dL (ref 0.4–1.2)
GFR: 106.89 mL/min (ref >60.00)
Glucose, Bld: 111 mg/dL — ABNORMAL HIGH (ref 70–99)
Potassium: 3.9 mEq/L (ref 3.5–5.1)
SODIUM: 142 meq/L (ref 135–145)
TOTAL PROTEIN: 7.2 g/dL (ref 6.0–8.3)

## 2014-09-23 LAB — HEMOGLOBIN A1C: Hgb A1c MFr Bld: 5.8 % (ref 4.6–6.5)

## 2014-09-23 LAB — MICROALBUMIN / CREATININE URINE RATIO
Creatinine,U: 31.9 mg/dL
MICROALB/CREAT RATIO: 2.8 mg/g (ref 0.0–30.0)
Microalb, Ur: 0.9 mg/dL (ref 0.0–1.9)

## 2014-09-23 MED ORDER — ZOLPIDEM TARTRATE ER 12.5 MG PO TBCR: 12.5000 mg | EXTENDED_RELEASE_TABLET | Freq: Every evening | ORAL | Status: DC | PRN

## 2014-09-23 MED ORDER — PHENTERMINE HCL 37.5 MG PO TABS: ORAL_TABLET | ORAL | Status: DC

## 2014-09-23 MED ORDER — ALPRAZOLAM 0.5 MG PO TABS: ORAL_TABLET | ORAL | Status: DC

## 2014-09-23 NOTE — Progress Notes (Signed)
Patient ID: Sheri [name], female   DOB: 1956/02/25, 59 y.o.   MRN: [id number]   Patient Active Problem List   Diagnosis Date Noted  . Hyperlipidemia associated with type 2 diabetes mellitus 09/24/2014  . Hematoma 05/16/2014  . Injury of toenail 05/16/2014  . Essential hypertension, benign 02/26/2014  . Edema 02/17/2014  . Morbid obesity 05/13/2013  . Major depressive disorder, single episode 05/13/2013  . Reflux esophagitis 05/11/2013  . Sinus tachycardia 04/24/2012  . Chest pain at rest 04/19/2012  . Type 2 DM with diabetic neuropathy affecting both sides of body 04/18/2012  . Headache, chronic daily 05/17/2011  . Insomnia, psychophysiological 05/17/2011    Subjective:  CC:   Chief Complaint  Patient presents with  . Follow-up    weight    HPI:   Sheri [name] is a 59 y.o. female who presents for Follow up on Type 2 DM complicated by obesity and hyperlipidemia.   For the last 3 months   She has had an intentional weight loss of 24 lbs since last visit by reducing the sugar in her diet and taking phentermine for appetite suppression, .  She has been keeping her carbohydrate content well under the recommended  45 carbs per meal by eliminiating sweetened tea and soda and cutting out bagels for breakfast.  She is walking 4 times per week.   She Had an episode of SVT one month ago which was found while visiting her aunt at [facility] by the [employer].  Her symptom was primarily extreme fatigue, no chest pain or dyspnea.  Her pulse was 144.  The episode resolved spontaneously She reduced the phentermine to 1/2 tablet  Daily for a while but started gaining weight again she she has resumed a full tablet . She has had no subsequent episodes.    Past Medical History  Diagnosis Date  . Hypertension   . Unspecified hereditary and idiopathic peripheral neuropathy   . Diabetes mellitus, type 2   . Osteopenia   . Unspecified sleep apnea     sleep study- [facility]  . Reflux esophagitis    . Diverticulosis of colon without hemorrhage   . Rash and other nonspecific skin eruption   . Personal history of malignant neoplasm of cervix uteri     CIN-3 1989, normal since, gets pap smears annually  . Personal history of other genital system and obstetric disorders(V13.29)   . Abdominal pain, epigastric   . Panic disorder without agoraphobia   . Duodenitis with hemorrhage   . Edema   . Obesity, unspecified   . Hyperlipidemia   . Heart murmur   . Mitral regurgitation     Past Surgical History  Procedure Laterality Date  . Right oophorectomy  2001    menopause, endometriosis s/p.  HRT  since- has not tolerated previous attempts to wean  . Breast cyst aspiration      right , benign       The following portions of the patient's history were reviewed and updated as appropriate: Allergies, current medications, and problem list.    Review of Systems:   Patient denies headache, fevers, malaise, unintentional weight loss, skin rash, eye pain, sinus congestion and sinus pain, sore throat, dysphagia,  hemoptysis , cough, dyspnea, wheezing, chest pain, palpitations, orthopnea, edema, abdominal pain, nausea, melena, diarrhea, constipation, flank pain, dysuria, hematuria, urinary  Frequency, nocturia, numbness, tingling, seizures,  Focal weakness, Loss of consciousness,  Tremor, insomnia, depression, anxiety, and suicidal ideation.  History   Social History  . Marital Status: Married    Spouse Name: N/A    Number of Children: N/A  . Years of Education: N/A   Occupational History  . 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 Smoking status: Never Smoker   . Smokeless tobacco: Never Used  . Alcohol Use: Yes     Comment: rare  . Drug Use: No  . Sexual Activity: Not on file   Other Topics Concern  . Not on file   Social History Narrative   Lives with spouse but the two are emotionally estranged due to spouse's infidelity.           Objective:  Filed Vitals:   09/23/14 0911  BP: 128/82  Pulse: 91  Temp: 98.5 F (36.9 C)  Resp: 16     General appearance: alert, cooperative and appears stated age Ears: normal TM's and external ear canals both ears Throat: lips, mucosa, and tongue normal; teeth and gums normal Neck: no adenopathy, no carotid bruit, supple, symmetrical, trachea midline and thyroid not enlarged, symmetric, no tenderness/mass/nodules Back: symmetric, no curvature. ROM normal. No CVA tenderness. Lungs: clear to auscultation bilaterally Heart: regular rate and rhythm, S1, S2 normal, no murmur, click, rub or gallop Abdomen: soft, non-tender; bowel sounds normal; no masses,  no organomegaly Pulses: 2+ and symmetric Skin: Skin color, texture, turgor normal. No rashes or lesions Lymph nodes: Cervical, supraclavicular, and axillary nodes normal.  Assessment and Plan:  Morbid obesity She has lost 24 lbs on phentermine over the past 3 months and is tolerating the medication despite one episode of SVT.  Continue for 3 more months.. Diet and exercise regimen reviewed.     Essential hypertension, benign Well controlled on current regimen. Renal function stable, no changes today unless SVT recurs, will add metoprolol if indicated.   Lab Results  Component Value Date   CREATININE 0.6 09/23/2014   Lab Results  Component Value Date   NA 142 09/23/2014   K 3.9 09/23/2014   CL 111 09/23/2014   CO2 20 09/23/2014     Type 2 DM with diabetic neuropathy affecting both sides of body  Historically well-controlled on diet alone .  hemoglobin A1c has been consistently at or  less than 7.0 . Patient is uis already on on ACE/ARB for renal protection and hypertension  Lab Results  Component Value Date   HGBA1C 5.8 09/23/2014   Lab Results  Component Value Date   MICROALBUR 0.9 09/23/2014   No results found for: LIPASE     Hyperlipidemia associated with type 2 diabetes mellitus New ACC  guidelines recommend starting patients aged 6 or higher on moderate intensity statin therapy for diabetes and concurrent LDL between 70-189. I have advised her to consider starting statin therapy to mitigate her risk of CAD.   Lab Results  Component Value Date   CHOL 206* 09/23/2014   HDL 40.60 09/23/2014   LDLCALC 144* 09/23/2014   LDLDIRECT 149.7 05/11/2013   TRIG 107.0 09/23/2014   CHOLHDL 5 09/23/2014     Sinus tachycardia Improved baseline HR with wt loss and conditioning,  Recent run of SVT occurred in the setting of phentermine and caffeine and has not recurred. Advised to limit caffein intake and call if another episode occurs    Updated Medication List Outpatient Encounter Prescriptions as of 09/23/2014  Medication Sig  . ALPRAZolam (XANAX) 0.5 MG tablet TAKE 1 TABLET BY MOUTH TWICE DAILY  AS NEEDED FOR ANXIETY OR SLEEP  . furosemide (LASIX) 20 MG tablet Take 1 tablet (20 mg total) by mouth daily. Take one daily as needed for fluid retention  . lisinopril-hydrochlorothiazide (PRINZIDE,ZESTORETIC) 20-25 MG per tablet TAKE 1 TABLET BY MOUTH DAILY.  . meloxicam (MOBIC) 15 MG tablet Take 1 tablet (15 mg total) by mouth daily.  . phentermine (ADIPEX-P) 37.5 MG tablet 1/2 tablet in the am and early afternoon  . zolpidem (AMBIEN CR) 12.5 MG CR tablet Take 1 tablet (12.5 mg total) by mouth at bedtime as needed. for sleep  . [DISCONTINUED] ALPRAZolam (XANAX) 0.5 MG tablet TAKE 1 TABLET BY MOUTH TWICE DAILY AS NEEDED FOR ANXIETY OR SLEEP  . [DISCONTINUED] phentermine (ADIPEX-P) 37.5 MG tablet 1/2 tablet in the am and early afternoon  . [DISCONTINUED] zolpidem (AMBIEN CR) 12.5 MG CR tablet TAKE ONE TABLET BY MOUTH AT BEDTIME AS NEEDED FOR SLEEP  . glucose blood (ONE TOUCH TEST STRIPS) test strip Use as instructed   . PARoxetine (PAXIL-CR) 12.5 MG 24 hr tablet Take 1 tablet by mouth every morning. (Patient not taking: Reported on 09/23/2014)  . pregabalin (LYRICA) 75 MG capsule Take 1  capsule (75 mg total) by mouth 3 (three) times daily. (Patient not taking: Reported on 09/23/2014)  . PREMPRO 0.3-1.5 MG per tablet Take 1 tablet by mouth daily. (Patient not taking: Reported on 09/23/2014)  . [DISCONTINUED] atorvastatin (LIPITOR) 20 MG tablet Take 1 tablet (20 mg total) by mouth daily. (Patient not taking: Reported on 09/23/2014)  . [DISCONTINUED] pantoprazole (PROTONIX) 40 MG tablet TAKE 1 TABLET BY MOUTH DAILY. (Patient not taking: Reported on 09/23/2014)  . [DISCONTINUED] potassium chloride SA (K-DUR,KLOR-CON) 20 MEQ tablet Take 1 tablet (20 mEq total) by mouth daily. (Patient not taking: Reported on 09/23/2014)     Orders Placed This Encounter  Procedures  . Hemoglobin A1c  . Lipid panel  . Comprehensive metabolic panel  . Microalbumin / creatinine urine ratio  . Hemoglobin A1c  . Lipid panel  . Comprehensive metabolic panel    Return in about 3 months (around 12/23/2014) for follow up diabetes.

## 2014-09-23 NOTE — Assessment & Plan Note (Addendum)
She has lost 24 lbs on phentermine over the past 3 months and is tolerating the medication despite one episode of SVT.  Continue for 3 more months.. Diet and exercise regimen reviewed.

## 2014-09-23 NOTE — Progress Notes (Signed)
Pre-visit discussion using our clinic review tool. No additional management support is needed unless otherwise documented below in the visit note.  

## 2014-09-23 NOTE — Patient Instructions (Signed)
You are doing fantastic!  We will continue the phentermine for another 3 months  You received the Pneumovax vaccine today,  Which is recommended for all people with diabetes  Diabetes and Standards of Medical Care Diabetes is complicated. You may find that your diabetes team includes a dietitian, nurse, diabetes educator, eye doctor, and more. To help everyone know what is going on and to help you get the care you deserve, the following schedule of care was developed to help keep you on track. Below are the tests, exams, vaccines, medicines, education, and plans you will need. HbA1c test This test shows how well you have controlled your glucose over the past 2-3 months. It is used to see if your diabetes management plan needs to be adjusted.   It is performed at least 2 times a year if you are meeting treatment goals.  It is performed 4 times a year if therapy has changed or if you are not meeting treatment goals. Blood pressure test  This test is performed at every routine medical visit. The goal is less than 140/90 mm Hg for most people, but 130/80 mm Hg in some cases. Ask your health care provider about your goal. Dental exam  Follow up with the dentist regularly. Eye exam  If you are diagnosed with type 1 diabetes as a child, get an exam upon reaching the age of 64 years or older and have had diabetes for 3-5 years. Yearly eye exams are recommended after that initial eye exam.  If you are diagnosed with type 1 diabetes as an adult, get an exam within 5 years of diagnosis and then yearly.  If you are diagnosed with type 2 diabetes, get an exam as soon as possible after the diagnosis and then yearly. Foot care exam  Visual foot exams are performed at every routine medical visit. The exams check for cuts, injuries, or other problems with the feet.  A comprehensive foot exam should be done yearly. This includes visual inspection as well as assessing foot pulses and testing for loss of  sensation.  Check your feet nightly for cuts, injuries, or other problems with your feet. Tell your health care provider if anything is not healing. Kidney function test (urine microalbumin)  This test is performed once a year.  Type 1 diabetes: The first test is performed 5 years after diagnosis.  Type 2 diabetes: The first test is performed at the time of diagnosis.  A serum creatinine and estimated glomerular filtration rate (eGFR) test is done once a year to assess the level of chronic kidney disease (CKD), if present. Lipid profile (cholesterol, HDL, LDL, triglycerides)  Performed every 5 years for most people.  The goal for LDL is less than 100 mg/dL. If you are at high risk, the goal is less than 70 mg/dL.  The goal for HDL is 40 mg/dL-50 mg/dL for men and 50 mg/dL-60 mg/dL for women. An HDL cholesterol of 60 mg/dL or higher gives some protection against heart disease.  The goal for triglycerides is less than 150 mg/dL. Influenza vaccine, pneumococcal vaccine, and hepatitis B vaccine  The influenza vaccine is recommended yearly.  It is recommended that people with diabetes who are over 36 years old get the pneumonia vaccine. In some cases, two separate shots may be given. Ask your health care provider if your pneumonia vaccination is up to date.  The hepatitis B vaccine is also recommended for adults with diabetes. Diabetes self-management education  Education is recommended at  diagnosis and ongoing as needed. Treatment plan  Your treatment plan is reviewed at every medical visit. Document Released: 07/04/2009 Document Revised: 01/21/2014 Document Reviewed: 02/06/2013 [facility] Patient Information 2015 [employer], [place]. This information is not intended to replace advice given to you by your health care provider. Make sure you discuss any questions you have with your health care provider.

## 2014-09-24 ENCOUNTER — Encounter: Payer: Self-pay | Admitting: Internal Medicine

## 2014-09-24 DIAGNOSIS — E785 Hyperlipidemia, unspecified: Secondary | ICD-10-CM

## 2014-09-24 DIAGNOSIS — E1169 Type 2 diabetes mellitus with other specified complication: Secondary | ICD-10-CM | POA: Insufficient documentation

## 2014-09-24 NOTE — Assessment & Plan Note (Signed)
Well controlled on current regimen. Renal function stable, no changes today unless SVT recurs, will add metoprolol if indicated.   Lab Results  Component Value Date   CREATININE 0.6 09/23/2014   Lab Results  Component Value Date   NA 142 09/23/2014   K 3.9 09/23/2014   CL 111 09/23/2014   CO2 20 09/23/2014

## 2014-09-24 NOTE — Assessment & Plan Note (Signed)
New ACC guidelines recommend starting patients aged 59 or higher on moderate intensity statin therapy for diabetes and concurrent LDL between 70-189. I have advised her to consider starting statin therapy to mitigate her risk of CAD.   Lab Results  Component Value Date   CHOL 206* 09/23/2014   HDL 40.60 09/23/2014   LDLCALC 144* 09/23/2014   LDLDIRECT 149.7 05/11/2013   TRIG 107.0 09/23/2014   CHOLHDL 5 09/23/2014

## 2014-09-24 NOTE — Addendum Note (Signed)
Addended by: Nanci Pina on: 09/24/2014 10:37 AM   Modules accepted: Orders

## 2014-09-24 NOTE — Assessment & Plan Note (Signed)
Historically well-controlled on diet alone .  hemoglobin A1c has been consistently at or  less than 7.0 . Patient is uis already on on ACE/ARB for renal protection and hypertension  Lab Results  Component Value Date   HGBA1C 5.8 09/23/2014   Lab Results  Component Value Date   MICROALBUR 0.9 09/23/2014   No results found for: LIPASE

## 2014-09-24 NOTE — Assessment & Plan Note (Signed)
Improved baseline HR with wt loss and conditioning,  Recent run of SVT occurred in the setting of phentermine and caffeine and has not recurred. Advised to limit caffein intake and call if another episode occurs

## 2014-09-27 ENCOUNTER — Other Ambulatory Visit: Payer: Self-pay | Admitting: Internal Medicine

## 2014-09-27 MED ORDER — ATORVASTATIN CALCIUM 20 MG PO TABS: 20.0000 mg | ORAL_TABLET | Freq: Every day | ORAL | Status: DC

## 2014-10-15 ENCOUNTER — Encounter: Payer: Self-pay | Admitting: Internal Medicine

## 2014-12-23 ENCOUNTER — Other Ambulatory Visit: Payer: Self-pay | Admitting: Internal Medicine

## 2014-12-23 NOTE — Telephone Encounter (Signed)
Last Ov 1.4.16, last refill 3.7.16.  Advise refill

## 2014-12-24 NOTE — Telephone Encounter (Signed)
Ok to refill,  printed rx  

## 2014-12-24 NOTE — Telephone Encounter (Signed)
rx faxed

## 2014-12-25 ENCOUNTER — Ambulatory Visit (INDEPENDENT_AMBULATORY_CARE_PROVIDER_SITE_OTHER): Payer: 59 | Admitting: Internal Medicine

## 2014-12-25 ENCOUNTER — Encounter: Payer: Self-pay | Admitting: Internal Medicine

## 2014-12-25 DIAGNOSIS — E1142 Type 2 diabetes mellitus with diabetic polyneuropathy: Secondary | ICD-10-CM | POA: Diagnosis not present

## 2014-12-25 DIAGNOSIS — I10 Essential (primary) hypertension: Secondary | ICD-10-CM

## 2014-12-25 DIAGNOSIS — E1169 Type 2 diabetes mellitus with other specified complication: Secondary | ICD-10-CM

## 2014-12-25 DIAGNOSIS — E785 Hyperlipidemia, unspecified: Secondary | ICD-10-CM

## 2014-12-25 DIAGNOSIS — E119 Type 2 diabetes mellitus without complications: Secondary | ICD-10-CM | POA: Diagnosis not present

## 2014-12-25 DIAGNOSIS — F5104 Psychophysiologic insomnia: Secondary | ICD-10-CM

## 2014-12-25 LAB — COMPREHENSIVE METABOLIC PANEL
ALBUMIN: 4.4 g/dL (ref 3.5–5.2)
ALK PHOS: 61 U/L (ref 39–117)
ALT: 20 U/L (ref 0–35)
AST: 15 U/L (ref 0–37)
BILIRUBIN TOTAL: 0.7 mg/dL (ref 0.2–1.2)
BUN: 22 mg/dL (ref 6–23)
CO2: 25 mEq/L (ref 19–32)
Calcium: 10 mg/dL (ref 8.4–10.5)
Chloride: 102 mEq/L (ref 96–112)
Creatinine, Ser: 0.69 mg/dL (ref 0.40–1.20)
GFR: 92.64 mL/min (ref >60.00)
GLUCOSE: 125 mg/dL — AB (ref 70–99)
Potassium: 3.7 mEq/L (ref 3.5–5.1)
SODIUM: 134 meq/L — AB (ref 135–145)
Total Protein: 7.4 g/dL (ref 6.0–8.3)

## 2014-12-25 LAB — LIPID PANEL
Cholesterol: 201 mg/dL — ABNORMAL HIGH (ref 0–200)
HDL: 44.7 mg/dL (ref >39.00)
LDL Cholesterol: 129 mg/dL — ABNORMAL HIGH (ref 0–99)
NonHDL: 156.3
Total CHOL/HDL Ratio: 4
Triglycerides: 139 mg/dL (ref 0.0–149.0)
VLDL: 27.8 mg/dL (ref 0.0–40.0)

## 2014-12-25 LAB — HM DIABETES FOOT EXAM: HM DIABETIC FOOT EXAM: NORMAL

## 2014-12-25 MED ORDER — AMITRIPTYLINE HCL 25 MG PO TABS: 75.0000 mg | ORAL_TABLET | Freq: Every day | ORAL | Status: DC

## 2014-12-25 MED ORDER — PHENTERMINE HCL 37.5 MG PO TABS: ORAL_TABLET | ORAL | Status: DC

## 2014-12-25 NOTE — Progress Notes (Signed)
Patient ID: Sheri [name], female   DOB: 1956/02/10, 59 y.o.   MRN: [id number]   Patient Active Problem List   Diagnosis Date Noted  . Hyperlipidemia associated with type 2 diabetes mellitus 09/24/2014  . Hematoma 05/16/2014  . Injury of toenail 05/16/2014  . Essential hypertension, benign 02/26/2014  . Edema 02/17/2014  . Morbid obesity 05/13/2013  . Major depressive disorder, single episode 05/13/2013  . Reflux esophagitis 05/11/2013  . Sinus tachycardia 04/24/2012  . Chest pain at rest 04/19/2012  . Type 2 DM with diabetic neuropathy affecting both sides of body 04/18/2012  . Headache, chronic daily 05/17/2011  . Insomnia, psychophysiological 05/17/2011    Subjective:  CC:   Chief Complaint  Patient presents with  . Follow-up    weight    HPI:  Sheri [name] is a 59 y.o. female who presents for   Follow up on obesity with diet controlled DM,  Hyperlipidemia, chronic insomnia and idopathic neuropathy She has stopped walking due to her work schedule.  However, she has continued to lose weight on phentermine.  She has tolerated the medication without side effects,   And is following a low glycemic index diet. She has been keeping her carbohydrate content well under the recommended  20 carbs per meal .  She does not check her BS often but her fastings have been consistently under 120.  She has been having frequent early morning waking at 3:30 am despite using ambien cr at bedtime.  Her legs are still bothering her  In the lower shin range, but not with walking . She has not tolerated prior trials of  gabapentin or lyrica due to worsening edema and history of venous insufficiency.     Past Medical History  Diagnosis Date  . Hypertension   . Unspecified hereditary and idiopathic peripheral neuropathy   . Diabetes mellitus, type 2   . Osteopenia   . Unspecified sleep apnea     sleep study- [facility]  . Reflux esophagitis   . Diverticulosis of colon without hemorrhage   .  Rash and other nonspecific skin eruption   . Personal history of malignant neoplasm of cervix uteri     CIN-3 1989, normal since, gets pap smears annually  . Personal history of other genital system and obstetric disorders(V13.29)   . Abdominal pain, epigastric   . Panic disorder without agoraphobia   . Duodenitis with hemorrhage   . Edema   . Obesity, unspecified   . Hyperlipidemia   . Heart murmur   . Mitral regurgitation     Past Surgical History  Procedure Laterality Date  . Right oophorectomy  2001    menopause, endometriosis s/p.  HRT  since- has not tolerated previous attempts to wean  . Breast cyst aspiration      right , benign       The following portions of the patient's history were reviewed and updated as appropriate: Allergies, current medications, and problem list.    Review of Systems:   Patient denies headache, fevers, malaise, unintentional weight loss, skin rash, eye pain, sinus congestion and sinus pain, sore throat, dysphagia,  hemoptysis , cough, dyspnea, wheezing, chest pain, palpitations, orthopnea, edema, abdominal pain, nausea, melena, diarrhea, constipation, flank pain, dysuria, hematuria, urinary  Frequency, nocturia, numbness, tingling, seizures,  Focal weakness, Loss of consciousness,  Tremor, insomnia, depression, anxiety, and suicidal ideation.     History   Social History  . Marital Status: Married    Spouse Name: N/A  .  Number of Children: N/A  . Years of Education: N/A   Occupational History  . 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 Smoking status: Never Smoker   . Smokeless tobacco: Never Used  . Alcohol Use: Yes     Comment: rare  . Drug Use: No  . Sexual Activity: Not on file   Other Topics Concern  . Not on file   Social History Narrative   Lives with spouse but the two are emotionally estranged due to spouse's infidelity.          Objective:  Filed Vitals:   12/25/14 0810  BP: 118/78   Pulse: 97  Temp: 98 F (36.7 C)  Resp: 16     General appearance: alert, cooperative and appears stated age Ears: normal TM's and external ear canals both ears Throat: lips, mucosa, and tongue normal; teeth and gums normal Neck: no adenopathy, no carotid bruit, supple, symmetrical, trachea midline and thyroid not enlarged, symmetric, no tenderness/mass/nodules Back: symmetric, no curvature. ROM normal. No CVA tenderness. Lungs: clear to auscultation bilaterally Heart: regular rate and rhythm, S1, S2 normal, no murmur, click, rub or gallop Abdomen: soft, non-tender; bowel sounds normal; no masses,  no organomegaly Pulses: 2+ and symmetric Skin: Skin color, texture, turgor normal. No rashes or lesions Lymph nodes: Cervical, supraclavicular, and axillary nodes normal.  Assessment and Plan:  Morbid obesity She has lost 38 lbs on phentermine since October,  Which is  Over 15%  and is tolerating the medication despite one episode of SVT.  Continue for 3 more months.. Diet and exercise regimen reviewed.     Wt Readings from Last 3 Encounters:  12/25/14 193 lb 8 oz (87.771 kg)  09/23/14 207 lb 8 oz (94.121 kg)  06/21/14 231 lb 8 oz (105.008 kg)     Type 2 DM with diabetic neuropathy affecting both sides of body  Historically well-controlled on diet alone .  hemoglobin A1c has been consistently at or  less than 7.0 . Patient is uis already on on ACE/ARB for renal protection and hypertension  Lab Results  Component Value Date   HGBA1C 5.8 09/23/2014   Lab Results  Component Value Date   MICROALBUR 0.9 09/23/2014        Hyperlipidemia associated with type 2 diabetes mellitus LDL and triglycerides are at goal on current medications. She  has no side effects and liver enzymes are normal. No changes today   Lab Results  Component Value Date   CHOL 201* 12/25/2014   HDL 44.70 12/25/2014   LDLCALC 129* 12/25/2014   LDLDIRECT 149.7 05/11/2013   TRIG 139.0 12/25/2014    CHOLHDL 4 12/25/2014    Lab Results  Component Value Date   ALT 20 12/25/2014   AST 15 12/25/2014   ALKPHOS 61 12/25/2014   BILITOT 0.7 12/25/2014       Insomnia, psychophysiological  She is already taking the maximal dose of ambien Cr.  Recommended nonpharmacologic adjunct therapy including exercise and avoidance of electronic devices and viewing of television for 2 hours prior to bedtime .   Marland KitchenA total of 25 minutes of face to face time was spent with patient more than half of which was spent in counselling about the above mentioned conditions  and coordination of care    Updated Medication List Outpatient Encounter Prescriptions as of 12/25/2014  Medication Sig  . ALPRAZolam (XANAX) 0.5 MG tablet TAKE 1 TABLET BY MOUTH TWICE DAILY AS NEEDED  FOR ANXIETY OR SLEEP.  Marland Kitchen atorvastatin (LIPITOR) 20 MG tablet Take 1 tablet (20 mg total) by mouth daily.  . furosemide (LASIX) 20 MG tablet TAKE 1 TABLET BY MOUTH ONCE DAILY AS NEEDED FOR FLUID RETENTION  . glucose blood (ONE TOUCH TEST STRIPS) test strip Use as instructed   . lisinopril-hydrochlorothiazide (PRINZIDE,ZESTORETIC) 20-25 MG per tablet TAKE 1 TABLET BY MOUTH DAILY.  . meloxicam (MOBIC) 15 MG tablet Take 1 tablet (15 mg total) by mouth daily.  . phentermine (ADIPEX-P) 37.5 MG tablet 1/2 tablet in the am and early afternoon  . zolpidem (AMBIEN CR) 12.5 MG CR tablet Take 1 tablet (12.5 mg total) by mouth at bedtime as needed. for sleep  . [DISCONTINUED] phentermine (ADIPEX-P) 37.5 MG tablet 1/2 tablet in the am and early afternoon  . amitriptyline (ELAVIL) 25 MG tablet Take 3 tablets (75 mg total) by mouth at bedtime. For neuropathy     Orders Placed This Encounter  Procedures  . Comprehensive metabolic panel  . Hemoglobin A1c  . Lipid panel  . Microalbumin / creatinine urine ratio    No Follow-up on file.

## 2014-12-26 ENCOUNTER — Encounter: Payer: Self-pay | Admitting: Internal Medicine

## 2014-12-28 ENCOUNTER — Encounter: Payer: Self-pay | Admitting: Internal Medicine

## 2014-12-28 NOTE — Assessment & Plan Note (Signed)
LDL and triglycerides are at goal on current medications. She  has no side effects and liver enzymes are normal. No changes today   Lab Results  Component Value Date   CHOL 201* 12/25/2014   HDL 44.70 12/25/2014   LDLCALC 129* 12/25/2014   LDLDIRECT 149.7 05/11/2013   TRIG 139.0 12/25/2014   CHOLHDL 4 12/25/2014    Lab Results  Component Value Date   ALT 20 12/25/2014   AST 15 12/25/2014   ALKPHOS 61 12/25/2014   BILITOT 0.7 12/25/2014

## 2014-12-28 NOTE — Assessment & Plan Note (Signed)
Historically well-controlled on diet alone .  hemoglobin A1c has been consistently at or  less than 7.0 . Patient is uis already on on ACE/ARB for renal protection and hypertension  Lab Results  Component Value Date   HGBA1C 5.8 09/23/2014   Lab Results  Component Value Date   MICROALBUR 0.9 09/23/2014

## 2014-12-28 NOTE — Assessment & Plan Note (Signed)
She has lost 38 lbs on phentermine since October,  Which is  Over 15%  and is tolerating the medication despite one episode of SVT.  Continue for 3 more months.. Diet and exercise regimen reviewed.     Wt Readings from Last 3 Encounters:  12/25/14 193 lb 8 oz (87.771 kg)  09/23/14 207 lb 8 oz (94.121 kg)  06/21/14 231 lb 8 oz (105.008 kg)

## 2014-12-28 NOTE — Assessment & Plan Note (Signed)
She is already taking the maximal dose of ambien Cr.  Recommended nonpharmacologic adjunct therapy including exercise and avoidance of electronic devices and viewing of television for 2 hours prior to bedtime .

## 2015-02-24 ENCOUNTER — Other Ambulatory Visit: Payer: Self-pay | Admitting: Internal Medicine

## 2015-03-27 ENCOUNTER — Encounter: Payer: Self-pay | Admitting: Internal Medicine

## 2015-03-27 ENCOUNTER — Ambulatory Visit (INDEPENDENT_AMBULATORY_CARE_PROVIDER_SITE_OTHER): Payer: 59 | Admitting: Internal Medicine

## 2015-03-27 VITALS — BP 108/78 | HR 108 | Temp 98.0°F | Resp 14 | Ht 63.0 in | Wt 196.2 lb

## 2015-03-27 DIAGNOSIS — E1142 Type 2 diabetes mellitus with diabetic polyneuropathy: Secondary | ICD-10-CM

## 2015-03-27 DIAGNOSIS — E1169 Type 2 diabetes mellitus with other specified complication: Secondary | ICD-10-CM

## 2015-03-27 DIAGNOSIS — I10 Essential (primary) hypertension: Secondary | ICD-10-CM | POA: Diagnosis not present

## 2015-03-27 DIAGNOSIS — E669 Obesity, unspecified: Secondary | ICD-10-CM

## 2015-03-27 DIAGNOSIS — E785 Hyperlipidemia, unspecified: Secondary | ICD-10-CM

## 2015-03-27 DIAGNOSIS — E119 Type 2 diabetes mellitus without complications: Secondary | ICD-10-CM | POA: Diagnosis not present

## 2015-03-27 LAB — LIPID PANEL
CHOLESTEROL: 242 mg/dL — AB (ref 0–200)
HDL: 45.2 mg/dL (ref >39.00)
LDL CALC: 164 mg/dL — AB (ref 0–99)
NONHDL: 196.8
Total CHOL/HDL Ratio: 5
Triglycerides: 162 mg/dL — ABNORMAL HIGH (ref 0.0–149.0)
VLDL: 32.4 mg/dL (ref 0.0–40.0)

## 2015-03-27 LAB — MICROALBUMIN / CREATININE URINE RATIO
Creatinine,U: 39.2 mg/dL
MICROALB/CREAT RATIO: 1.8 mg/g (ref 0.0–30.0)
Microalb, Ur: <0.7 mg/dL (ref 0.0–1.9)

## 2015-03-27 LAB — COMPREHENSIVE METABOLIC PANEL
ALBUMIN: 4.4 g/dL (ref 3.5–5.2)
ALT: 20 U/L (ref 0–35)
AST: 18 U/L (ref 0–37)
Alkaline Phosphatase: 64 U/L (ref 39–117)
BUN: 22 mg/dL (ref 6–23)
CALCIUM: 9.9 mg/dL (ref 8.4–10.5)
CHLORIDE: 101 meq/L (ref 96–112)
CO2: 23 meq/L (ref 19–32)
Creatinine, Ser: 0.71 mg/dL (ref 0.40–1.20)
GFR: 89.56 mL/min (ref >60.00)
Glucose, Bld: 118 mg/dL — ABNORMAL HIGH (ref 70–99)
Potassium: 3.7 mEq/L (ref 3.5–5.1)
SODIUM: 135 meq/L (ref 135–145)
Total Bilirubin: 0.9 mg/dL (ref 0.2–1.2)
Total Protein: 7.5 g/dL (ref 6.0–8.3)

## 2015-03-27 LAB — HEMOGLOBIN A1C: Hgb A1c MFr Bld: 5.6 % (ref 4.6–6.5)

## 2015-03-27 MED ORDER — PHENTERMINE HCL 37.5 MG PO TABS: ORAL_TABLET | ORAL | Status: DC

## 2015-03-27 MED ORDER — ZOLPIDEM TARTRATE ER 12.5 MG PO TBCR: 12.5000 mg | EXTENDED_RELEASE_TABLET | Freq: Every evening | ORAL | Status: DC | PRN

## 2015-03-27 MED ORDER — ALPRAZOLAM 0.5 MG PO TABS: ORAL_TABLET | ORAL | Status: DC

## 2015-03-27 NOTE — Progress Notes (Signed)
Pre-visit discussion using our clinic review tool. No additional management support is needed unless otherwise documented below in the visit note.  

## 2015-03-27 NOTE — Patient Instructions (Addendum)
The  diet I discussed with you today is the 10 day Green Smoothie Cleansing /Detox Diet by Linden Dolin . available on [address] for around $10.  This is not a low carb or a weight loss diet,  It is fundamentally a "cleansing" low fat diet that eliminates sugar, gluten, caffeine, alcohol and dairy for 10 days .  What you add back after the initial ten days is entirely up to  you!  You can expect to lose 5 to 10 lbs depending on how strict you are.   I suggest drinking 2 smoothies daily and keeping one chewable meal (but keep it simple, like baked fish and salad, rice or bok choy) .  You snack primarily on fresh  fruit, egg whites and judicious quantities of nuts. You can add vegetable based protein powder (nothing with whey , since whey is dairy) in it.  [employer] has a [employer], political party .  I suggest using /drinking 2 smoothies daily and have one sensible low fat meal ,  The snacks are primarily fruit, egg whites and nuts.   It does require some form of a nutrient extractor (Vita Mix, a electric juicer,  Or a Nutribullet Rx).  i have found that using frozen fruits is much more convenient and cost effective. You can even find plenty of organic fruit in the frozen fruit section of [employer].  Just thaw what you need for the following day the night before in the refrigerator (to avoid jamming up your machine)

## 2015-03-27 NOTE — Progress Notes (Signed)
Subjective:  Patient ID: Sheri [name], female    DOB: 1956/03/17  Age: 58 y.o. MRN: [id number]  CC: The primary encounter diagnosis was Essential hypertension, benign. Diagnoses of Hyperlipidemia associated with type 2 diabetes mellitus, Type 2 DM with diabetic neuropathy affecting both sides of body, Diabetes mellitus without complication, and Obesity were also pertinent to this visit.  HPI Sheri [name] presents for follow up on Type 2 DM with obesity, hypertension and hyperlipidemia.  Has been taking phentermine and restricting her diet but has been unable to eat regularly or exercise regularly due to her workload .  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  She continues to suffer from insomnia aggravated by her stress induced by work load. She is using alprazolam to relax and stop worrying, and ambien to induce sleep .   Outpatient Prescriptions Prior to Visit  Medication Sig Dispense Refill  . amitriptyline (ELAVIL) 25 MG tablet Take 3 tablets (75 mg total) by mouth at bedtime. For neuropathy 90 tablet 1  . atorvastatin (LIPITOR) 20 MG tablet Take 1 tablet (20 mg total) by mouth daily. 90 tablet 3  . furosemide (LASIX) 20 MG tablet TAKE 1 TABLET BY MOUTH ONCE DAILY AS NEEDED FOR FLUID RETENTION 30 tablet 3  . glucose blood (ONE TOUCH TEST STRIPS) test strip Use as instructed     . lisinopril-hydrochlorothiazide (PRINZIDE,ZESTORETIC) 20-25 MG per tablet TAKE 1 TABLET BY MOUTH DAILY. 30 tablet 5  . meloxicam (MOBIC) 15 MG tablet Take 1 tablet (15 mg total) by mouth daily. 30 tablet 3  . ALPRAZolam (XANAX) 0.5 MG tablet TAKE 1 TABLET BY MOUTH TWICE DAILY AS NEEDED FOR ANXIETY OR SLEEP. 45 tablet 2  . phentermine (ADIPEX-P) 37.5 MG tablet 1/2 tablet in the am and early afternoon 30 tablet 2  . zolpidem (AMBIEN CR) 12.5 MG CR tablet Take 1 tablet  (12.5 mg total) by mouth at bedtime as needed. for sleep 30 tablet 5   No facility-administered medications prior to visit.    Review of Systems;  Patient denies headache, fevers, malaise, unintentional weight loss, skin rash, eye pain, sinus congestion and sinus pain, sore throat, dysphagia,  hemoptysis , cough, dyspnea, wheezing, chest pain, palpitations, orthopnea, edema, abdominal pain, nausea, melena, diarrhea, constipation, flank pain, dysuria, hematuria, urinary  Frequency, nocturia, numbness, tingling, seizures,  Focal weakness, Loss of consciousness,  Tremor, insomnia, depression, anxiety, and suicidal ideation.      Objective:  BP 108/78 mmHg  Pulse 108  Temp(Src) 98 F (36.7 C) (Oral)  Resp 14  Ht 5\' 3"  (1.6 m)  Wt 196 lb 4 oz (89.018 kg)  BMI 34.77 kg/m2  SpO2 96%  BP Readings from Last 3 Encounters:  03/27/15 108/78  12/25/14 118/78  09/23/14 128/82    Wt Readings from Last 3 Encounters:  03/27/15 196 lb 4 oz (89.018 kg)  12/25/14 193 lb 8 oz (87.771 kg)  09/23/14 207 lb 8 oz (94.121 kg)    General appearance: alert, cooperative and appears stated age Ears: normal TM's and external ear canals both ears Throat: lips, mucosa, and tongue normal; teeth and gums normal Neck: no adenopathy, no carotid bruit, supple, symmetrical, trachea midline and thyroid not enlarged, symmetric, no tenderness/mass/nodules Back: symmetric, no curvature. ROM normal. No CVA tenderness. Lungs: clear to auscultation bilaterally Heart: regular rate and rhythm,  S1, S2 normal, no murmur, click, rub or gallop Abdomen: soft, non-tender; bowel sounds normal; no masses,  no organomegaly Pulses: 2+ and symmetric Skin: Skin color, texture, turgor normal. No rashes or lesions Lymph nodes: Cervical, supraclavicular, and axillary nodes normal.  Lab Results  Component Value Date   HGBA1C 5.6 03/27/2015   HGBA1C 5.8 09/23/2014   HGBA1C 6.0 03/20/2014    Lab Results  Component Value Date    CREATININE 0.71 03/27/2015   CREATININE 0.69 12/25/2014   CREATININE 0.6 09/23/2014    Lab Results  Component Value Date   WBC 5.1 05/11/2013   HGB 13.7 05/11/2013   HCT 40.0 05/11/2013   PLT 301.0 05/11/2013   GLUCOSE 118* 03/27/2015   CHOL 242* 03/27/2015   TRIG 162.0* 03/27/2015   HDL 45.20 03/27/2015   LDLDIRECT 149.7 05/11/2013   LDLCALC 164* 03/27/2015   ALT 20 03/27/2015   AST 18 03/27/2015   NA 135 03/27/2015   K 3.7 03/27/2015   CL 101 03/27/2015   CREATININE 0.71 03/27/2015   BUN 22 03/27/2015   CO2 23 03/27/2015   TSH 1.02 05/11/2013   HGBA1C 5.6 03/27/2015   MICROALBUR <0.7 03/27/2015    No results found.  Assessment & Plan:   Problem List Items Addressed This Visit      Unprioritized   Type 2 DM with diabetic neuropathy affecting both sides of body     Historically well-controlled on diet alone .  hemoglobin A1c has been consistently at or  less than 7.0 . Patient is uis already on on ACE/ARB for renal protection and hypertension .  Reminder for diabetic eye exam given.  Lab Results  Component Value Date   HGBA1C 5.6 03/27/2015   Lab Results  Component Value Date   MICROALBUR <0.7 03/27/2015             Obesity    She has lost 38 lbs on phentermine since October,  Which is  Over 15%  and is tolerating the medication despite one episode of SVT.  Continue for 3 more months.. Diet and exercise regimen reviewed.   Body mass index is 34.77 kg/(m^2).   Wt Readings from Last 3 Encounters:  03/27/15 196 lb 4 oz (89.018 kg)  12/25/14 193 lb 8 oz (87.771 kg)  09/23/14 207 lb 8 oz (94.121 kg)          Relevant Medications   phentermine (ADIPEX-P) 37.5 MG tablet   Essential hypertension, benign - Primary    BP 108/78 mmHg  Pulse 108  Temp(Src) 98 F (36.7 C) (Oral)  Resp 14  Ht 5\' 3"  (1.6 m)  Wt 196 lb 4 oz (89.018 kg)  BMI 34.77 kg/m2  SpO2 96%  Well controlled on current regimen. No change with use of phentermine .  Renal function  stable, no changes today.  Lab Results  Component Value Date   CREATININE 0.71 03/27/2015   Lab Results  Component Value Date   NA 135 03/27/2015   K 3.7 03/27/2015   CL 101 03/27/2015   CO2 23 03/27/2015         Hyperlipidemia associated with type 2 diabetes mellitus    Elevated today, despite use of Lipitor, which may be due to adherence ,  Will confirm daily use and increase dose if compliance is confirmed.  Lab Results  Component Value Date   CHOL 242* 03/27/2015   HDL 45.20 03/27/2015   LDLCALC 164* 03/27/2015   LDLDIRECT 149.7 05/11/2013   TRIG  162.0* 03/27/2015   CHOLHDL 5 03/27/2015         Other Visit Diagnoses    Diabetes mellitus without complication           I am having Ms. Egloff maintain her glucose blood, meloxicam, atorvastatin, furosemide, amitriptyline, lisinopril-hydrochlorothiazide, ALPRAZolam, phentermine, and zolpidem.  Meds ordered this encounter  Medications  . ALPRAZolam (XANAX) 0.5 MG tablet    Sig: TAKE 1 TABLET BY MOUTH TWICE DAILY AS NEEDED FOR ANXIETY OR SLEEP.    Dispense:  45 tablet    Refill:  5  . phentermine (ADIPEX-P) 37.5 MG tablet    Sig: 1/2 tablet in the am and early afternoon    Dispense:  30 tablet    Refill:  2  . zolpidem (AMBIEN CR) 12.5 MG CR tablet    Sig: Take 1 tablet (12.5 mg total) by mouth at bedtime as needed. for sleep    Dispense:  30 tablet    Refill:  5    Medications Discontinued During This Encounter  Medication Reason  . ALPRAZolam (XANAX) 0.5 MG tablet Reorder  . phentermine (ADIPEX-P) 37.5 MG tablet Reorder  . zolpidem (AMBIEN CR) 12.5 MG CR tablet Reorder    A total of 40 minutes was spent with patient more than half of which was spent in counseling patient on the above mentioned issues , reviewing and explaining  labs and  and coordination of care. Follow-up: Return in about 3 months (around 06/27/2015) for follow up diabetes.   [name], MD

## 2015-03-29 ENCOUNTER — Encounter: Payer: Self-pay | Admitting: Internal Medicine

## 2015-03-29 NOTE — Assessment & Plan Note (Signed)
BP 108/78 mmHg  Pulse 108  Temp(Src) 98 F (36.7 C) (Oral)  Resp 14  Ht 5\' 3"  (1.6 m)  Wt 196 lb 4 oz (89.018 kg)  BMI 34.77 kg/m2  SpO2 96%  Well controlled on current regimen. No change with use of phentermine .  Renal function stable, no changes today.  Lab Results  Component Value Date   CREATININE 0.71 03/27/2015   Lab Results  Component Value Date   NA 135 03/27/2015   K 3.7 03/27/2015   CL 101 03/27/2015   CO2 23 03/27/2015

## 2015-03-29 NOTE — Assessment & Plan Note (Signed)
She has lost 38 lbs on phentermine since October,  Which is  Over 15%  and is tolerating the medication despite one episode of SVT.  Continue for 3 more months.. Diet and exercise regimen reviewed.   Body mass index is 34.77 kg/(m^2).   Wt Readings from Last 3 Encounters:  03/27/15 196 lb 4 oz (89.018 kg)  12/25/14 193 lb 8 oz (87.771 kg)  09/23/14 207 lb 8 oz (94.121 kg)

## 2015-03-29 NOTE — Assessment & Plan Note (Signed)
Elevated today, despite use of Lipitor, which may be due to adherence ,  Will confirm daily use and increase dose if compliance is confirmed.  Lab Results  Component Value Date   CHOL 242* 03/27/2015   HDL 45.20 03/27/2015   LDLCALC 164* 03/27/2015   LDLDIRECT 149.7 05/11/2013   TRIG 162.0* 03/27/2015   CHOLHDL 5 03/27/2015

## 2015-03-29 NOTE — Assessment & Plan Note (Signed)
Historically well-controlled on diet alone .  hemoglobin A1c has been consistently at or  less than 7.0 . Patient is uis already on on ACE/ARB for renal protection and hypertension .  Reminder for diabetic eye exam given.  Lab Results  Component Value Date   HGBA1C 5.6 03/27/2015   Lab Results  Component Value Date   MICROALBUR <0.7 03/27/2015

## 2015-04-28 ENCOUNTER — Encounter: Payer: Self-pay | Admitting: Internal Medicine

## 2015-06-30 ENCOUNTER — Ambulatory Visit (INDEPENDENT_AMBULATORY_CARE_PROVIDER_SITE_OTHER): Payer: 59 | Admitting: Internal Medicine

## 2015-06-30 ENCOUNTER — Encounter: Payer: Self-pay | Admitting: Internal Medicine

## 2015-06-30 VITALS — BP 110/70 | HR 89 | Temp 97.7°F | Resp 12 | Ht 63.0 in | Wt 203.5 lb

## 2015-06-30 DIAGNOSIS — E669 Obesity, unspecified: Secondary | ICD-10-CM

## 2015-06-30 DIAGNOSIS — I10 Essential (primary) hypertension: Secondary | ICD-10-CM

## 2015-06-30 DIAGNOSIS — E785 Hyperlipidemia, unspecified: Secondary | ICD-10-CM

## 2015-06-30 DIAGNOSIS — E1169 Type 2 diabetes mellitus with other specified complication: Secondary | ICD-10-CM | POA: Diagnosis not present

## 2015-06-30 DIAGNOSIS — E1142 Type 2 diabetes mellitus with diabetic polyneuropathy: Secondary | ICD-10-CM | POA: Diagnosis not present

## 2015-06-30 LAB — LDL CHOLESTEROL, DIRECT: Direct LDL: 81 mg/dL

## 2015-06-30 LAB — COMPREHENSIVE METABOLIC PANEL
ALK PHOS: 65 U/L (ref 39–117)
ALT: 23 U/L (ref 0–35)
AST: 18 U/L (ref 0–37)
Albumin: 4.2 g/dL (ref 3.5–5.2)
BUN: 30 mg/dL — AB (ref 6–23)
CHLORIDE: 106 meq/L (ref 96–112)
CO2: 22 mEq/L (ref 19–32)
Calcium: 9.3 mg/dL (ref 8.4–10.5)
Creatinine, Ser: 0.67 mg/dL (ref 0.40–1.20)
GFR: 95.67 mL/min (ref >60.00)
GLUCOSE: 127 mg/dL — AB (ref 70–99)
Potassium: 3.9 mEq/L (ref 3.5–5.1)
SODIUM: 137 meq/L (ref 135–145)
TOTAL PROTEIN: 7 g/dL (ref 6.0–8.3)
Total Bilirubin: 0.6 mg/dL (ref 0.2–1.2)

## 2015-06-30 LAB — HEMOGLOBIN A1C: Hgb A1c MFr Bld: 5.7 % (ref 4.6–6.5)

## 2015-06-30 LAB — LIPID PANEL
CHOL/HDL RATIO: 3
Cholesterol: 137 mg/dL (ref 0–200)
HDL: 44.3 mg/dL (ref >39.00)
LDL CALC: 70 mg/dL (ref 0–99)
NonHDL: 92.97
TRIGLYCERIDES: 114 mg/dL (ref 0.0–149.0)
VLDL: 22.8 mg/dL (ref 0.0–40.0)

## 2015-06-30 MED ORDER — AMITRIPTYLINE HCL 25 MG PO TABS: 75.0000 mg | ORAL_TABLET | Freq: Every day | ORAL | Status: DC

## 2015-06-30 NOTE — Progress Notes (Signed)
Pre-visit discussion using our clinic review tool. No additional management support is needed unless otherwise documented below in the visit note.  

## 2015-06-30 NOTE — Patient Instructions (Addendum)
WE ARE TAKING A BREAK FROM THE PHENTERMINE FOR NOW  I want you to commit to getting 3 1/2 hour exercise programs per week  I agree with a community [employer] approach!

## 2015-06-30 NOTE — Progress Notes (Signed)
Subjective:  Patient ID: Sheri [name], female    DOB: [birth year]  Age: 59 y.o. MRN: [id number]  CC: The primary encounter diagnosis was Essential hypertension, benign. Diagnoses of Type 2 DM with diabetic neuropathy affecting both sides of body (HCC), Obesity, and Hyperlipidemia associated with type 2 diabetes mellitus (HCC) were also pertinent to this visit.  HPI Sheri [name] presents for *follow up on Type 2 DM with obesity. She feels generally well,  checking blood sugars once daily at variable times.  BS have been under 130 fasting and < 150 post prandially.  Denies any recent hypoglyemic events.  Taking hers medications as directed. Following a carbohydrate modified diet 6 days per week. Denies numbness, burning and tingling of extremities. Appetite has increased. .    Has gained 7 lbs since her last visit despite continuation of phentermine therapy.  She ran out of phentermine 3 weeks ago and food cravings have come back.  She is not exercising.  She has decided to join  [facility]   FLU SHOT WAS LAST WEEK 24-48 HOURS OF MYALGAIS AND HEADACHE NOW RESOLVED   Outpatient Prescriptions Prior to Visit  Medication Sig Dispense Refill  . ALPRAZolam (XANAX) 0.5 MG tablet TAKE 1 TABLET BY MOUTH TWICE DAILY AS NEEDED FOR ANXIETY OR SLEEP. 45 tablet 5  . atorvastatin (LIPITOR) 20 MG tablet Take 1 tablet (20 mg total) by mouth daily. 90 tablet 3  . furosemide (LASIX) 20 MG tablet TAKE 1 TABLET BY MOUTH ONCE DAILY AS NEEDED FOR FLUID RETENTION 30 tablet 3  . glucose blood (ONE TOUCH TEST STRIPS) test strip Use as instructed     . lisinopril-hydrochlorothiazide (PRINZIDE,ZESTORETIC) 20-25 MG per tablet TAKE 1 TABLET BY MOUTH DAILY. 30 tablet 5  . meloxicam (MOBIC) 15 MG tablet Take 1 tablet (15 mg total) by mouth daily. 30 tablet 3  . zolpidem (AMBIEN CR) 12.5 MG CR tablet Take 1 tablet (12.5 mg total) by mouth at bedtime as needed. for sleep 30 tablet 5  . amitriptyline  (ELAVIL) 25 MG tablet Take 3 tablets (75 mg total) by mouth at bedtime. For neuropathy (Patient not taking: Reported on 06/30/2015) 90 tablet 1  . phentermine (ADIPEX-P) 37.5 MG tablet 1/2 tablet in the am and early afternoon (Patient not taking: Reported on 06/30/2015) 30 tablet 2   No facility-administered medications prior to visit.    Review of Systems;  Patient denies headache, fevers, malaise, unintentional weight loss, skin rash, eye pain, sinus congestion and sinus pain, sore throat, dysphagia,  hemoptysis , cough, dyspnea, wheezing, chest pain, palpitations, orthopnea, edema, abdominal pain, nausea, melena, diarrhea, constipation, flank pain, dysuria, hematuria, urinary  Frequency, nocturia, numbness, tingling, seizures,  Focal weakness, Loss of consciousness,  Tremor, insomnia, depression, anxiety, and suicidal ideation.      Objective:  BP 110/70 mmHg  Pulse 89  Temp(Src) 97.7 F (36.5 C) (Oral)  Resp 12  Ht 5\' 3"  (1.6 m)  Wt 203 lb 8 oz (92.307 kg)  BMI 36.06 kg/m2  SpO2 97%  BP Readings from Last 3 Encounters:  06/30/15 110/70  03/27/15 108/78  12/25/14 118/78    Wt Readings from Last 3 Encounters:  06/30/15 203 lb 8 oz (92.307 kg)  03/27/15 196 lb 4 oz (89.018 kg)  12/25/14 193 lb 8 oz (87.771 kg)    General appearance: alert, cooperative and appears stated age Ears: normal TM's and external ear canals both ears Throat: lips, mucosa, and tongue normal; teeth and gums  normal Neck: no adenopathy, no carotid bruit, supple, symmetrical, trachea midline and thyroid not enlarged, symmetric, no tenderness/mass/nodules Back: symmetric, no curvature. ROM normal. No CVA tenderness. Lungs: clear to auscultation bilaterally Heart: regular rate and rhythm, S1, S2 normal, no murmur, click, rub or gallop Abdomen: soft, non-tender; bowel sounds normal; no masses,  no organomegaly Pulses: 2+ and symmetric Skin: Skin color, texture, turgor normal. No rashes or lesions Lymph  nodes: Cervical, supraclavicular, and axillary nodes normal.  Lab Results  Component Value Date   HGBA1C 5.7 06/30/2015   HGBA1C 5.6 03/27/2015   HGBA1C 5.8 09/23/2014    Lab Results  Component Value Date   CREATININE 0.67 06/30/2015   CREATININE 0.71 03/27/2015   CREATININE 0.69 12/25/2014    Lab Results  Component Value Date   WBC 5.1 05/11/2013   HGB 13.7 05/11/2013   HCT 40.0 05/11/2013   PLT 301.0 05/11/2013   GLUCOSE 127* 06/30/2015   CHOL 137 06/30/2015   TRIG 114.0 06/30/2015   HDL 44.30 06/30/2015   LDLDIRECT 81.0 06/30/2015   LDLCALC 70 06/30/2015   ALT 23 06/30/2015   AST 18 06/30/2015   NA 137 06/30/2015   K 3.9 06/30/2015   CL 106 06/30/2015   CREATININE 0.67 06/30/2015   BUN 30* 06/30/2015   CO2 22 06/30/2015   TSH 1.02 05/11/2013   HGBA1C 5.7 06/30/2015   MICROALBUR <0.7 03/27/2015    No results found.  Assessment & Plan:   Problem List Items Addressed This Visit    Type 2 DM with diabetic neuropathy affecting both sides of body (HCC)    well-controlled on diet alone .  hemoglobin A1c has been consistently at or  less than 7.0 . Patient is is already on on ACE/ARB for renal protection and hypertension .  Reminder for diabetic eye exam given.  Lab Results  Component Value Date   HGBA1C 5.7 06/30/2015   Lab Results  Component Value Date   MICROALBUR <0.7 03/27/2015               Relevant Orders   Hemoglobin A1c (Completed)   Obesity    stopping phentermine  Given weight plateau and not exercising. Patient is joining weight watcher's program.       Hyperlipidemia associated with type 2 diabetes mellitus (HCC)    LDL and triglycerides are at goal on current medications. He has no side effects and liver enzymes are normal. No changes today  Lab Results  Component Value Date   CHOL 137 06/30/2015   HDL 44.30 06/30/2015   LDLCALC 70 06/30/2015   LDLDIRECT 81.0 06/30/2015   TRIG 114.0 06/30/2015   CHOLHDL 3 06/30/2015    Lab  Results  Component Value Date   ALT 23 06/30/2015   AST 18 06/30/2015   ALKPHOS 65 06/30/2015   BILITOT 0.6 06/30/2015          Relevant Orders   LDL cholesterol, direct (Completed)   Lipid panel (Completed)   Essential hypertension, benign - Primary   Relevant Orders   Comprehensive metabolic panel (Completed)      I have discontinued Ms. Slovacek's phentermine. I am also having her maintain her glucose blood, meloxicam, atorvastatin, furosemide, lisinopril-hydrochlorothiazide, ALPRAZolam, zolpidem, and amitriptyline.  Meds ordered this encounter  Medications  . amitriptyline (ELAVIL) 25 MG tablet    Sig: Take 3 tablets (75 mg total) by mouth at bedtime. For neuropathy    Dispense:  270 tablet    Refill:  1  Medications Discontinued During This Encounter  Medication Reason  . phentermine (ADIPEX-P) 37.5 MG tablet   . amitriptyline (ELAVIL) 25 MG tablet Reorder    Follow-up: Return in about 3 months (around 09/30/2015) for follow up diabetes.   [name], MD

## 2015-07-01 NOTE — Assessment & Plan Note (Signed)
stopping phentermine  Given weight plateau and not exercising. Patient is joining weight watcher's program.

## 2015-07-01 NOTE — Assessment & Plan Note (Signed)
well-controlled on diet alone .  hemoglobin A1c has been consistently at or  less than 7.0 . Patient is is already on on ACE/ARB for renal protection and hypertension .  Reminder for diabetic eye exam given.  Lab Results  Component Value Date   HGBA1C 5.7 06/30/2015   Lab Results  Component Value Date   MICROALBUR <0.7 03/27/2015

## 2015-07-01 NOTE — Assessment & Plan Note (Signed)
LDL and triglycerides are at goal on current medications. He has no side effects and liver enzymes are normal. No changes today  Lab Results  Component Value Date   CHOL 137 06/30/2015   HDL 44.30 06/30/2015   LDLCALC 70 06/30/2015   LDLDIRECT 81.0 06/30/2015   TRIG 114.0 06/30/2015   CHOLHDL 3 06/30/2015    Lab Results  Component Value Date   ALT 23 06/30/2015   AST 18 06/30/2015   ALKPHOS 65 06/30/2015   BILITOT 0.6 06/30/2015

## 2015-07-02 ENCOUNTER — Encounter: Payer: Self-pay | Admitting: Internal Medicine

## 2015-08-25 ENCOUNTER — Encounter: Payer: Self-pay | Admitting: Physician Assistant

## 2015-08-25 ENCOUNTER — Ambulatory Visit: Payer: Self-pay | Admitting: Physician Assistant

## 2015-08-25 VITALS — BP 138/98 | HR 84 | Temp 97.7°F

## 2015-08-25 DIAGNOSIS — J069 Acute upper respiratory infection, unspecified: Secondary | ICD-10-CM

## 2015-08-25 NOTE — Progress Notes (Signed)
S: C/o runny nose and congestion for 3 days, no fever, chills, cp/sob, v/d; mucus was green this am but clear throughout the day, cough is sporadic, voice is hoarse, has a lot of sinus pressure  Using otc meds: robitussin  O: PE: vitals wnl, nad, perrl eomi, normocephalic, tms dull, nasal mucosa swollen, throat injected, neck supple no lymph, lungs c t a, cv rrr, neuro intact, voice is hoarse  A:  Acute viral uri   P: drink fluids, continue regular meds , use otc meds of choice, return if not improving in 5 days, return earlier if worsening ; if not improving by Friday 12/9 will call in an antibiotic

## 2015-09-18 ENCOUNTER — Other Ambulatory Visit: Payer: Self-pay | Admitting: Internal Medicine

## 2015-10-02 ENCOUNTER — Ambulatory Visit (INDEPENDENT_AMBULATORY_CARE_PROVIDER_SITE_OTHER): Payer: 59 | Admitting: Internal Medicine

## 2015-10-02 ENCOUNTER — Encounter: Payer: Self-pay | Admitting: Internal Medicine

## 2015-10-02 VITALS — BP 108/74 | HR 114 | Temp 98.0°F | Wt 207.5 lb

## 2015-10-02 DIAGNOSIS — I10 Essential (primary) hypertension: Secondary | ICD-10-CM | POA: Diagnosis not present

## 2015-10-02 DIAGNOSIS — E1142 Type 2 diabetes mellitus with diabetic polyneuropathy: Secondary | ICD-10-CM

## 2015-10-02 DIAGNOSIS — E1169 Type 2 diabetes mellitus with other specified complication: Secondary | ICD-10-CM

## 2015-10-02 DIAGNOSIS — E669 Obesity, unspecified: Secondary | ICD-10-CM

## 2015-10-02 DIAGNOSIS — E785 Hyperlipidemia, unspecified: Secondary | ICD-10-CM

## 2015-10-02 LAB — COMPREHENSIVE METABOLIC PANEL
ALT: 22 U/L (ref 0–35)
AST: 18 U/L (ref 0–37)
Albumin: 4.9 g/dL (ref 3.5–5.2)
Alkaline Phosphatase: 75 U/L (ref 39–117)
BILIRUBIN TOTAL: 1.2 mg/dL (ref 0.2–1.2)
BUN: 22 mg/dL (ref 6–23)
CO2: 27 meq/L (ref 19–32)
CREATININE: 0.61 mg/dL (ref 0.40–1.20)
Calcium: 10.3 mg/dL (ref 8.4–10.5)
Chloride: 98 mEq/L (ref 96–112)
GFR: 106.52 mL/min (ref >60.00)
GLUCOSE: 141 mg/dL — AB (ref 70–99)
Potassium: 3.3 mEq/L — ABNORMAL LOW (ref 3.5–5.1)
SODIUM: 135 meq/L (ref 135–145)
Total Protein: 7.3 g/dL (ref 6.0–8.3)

## 2015-10-02 LAB — LIPID PANEL
CHOL/HDL RATIO: 3
Cholesterol: 151 mg/dL (ref 0–200)
HDL: 51.9 mg/dL (ref >39.00)
LDL CALC: 80 mg/dL (ref 0–99)
NONHDL: 99.13
TRIGLYCERIDES: 98 mg/dL (ref 0.0–149.0)
VLDL: 19.6 mg/dL (ref 0.0–40.0)

## 2015-10-02 LAB — HEMOGLOBIN A1C: HEMOGLOBIN A1C: 5.9 % (ref 4.6–6.5)

## 2015-10-02 MED ORDER — PHENTERMINE HCL 37.5 MG PO TABS: ORAL_TABLET | ORAL | Status: DC

## 2015-10-02 NOTE — Progress Notes (Signed)
Pre visit review using our clinic review tool, if applicable. No additional management support is needed unless otherwise documented below in the visit note. 

## 2015-10-02 NOTE — Patient Instructions (Addendum)
1I have authorized the use of phentermine for 3 months  and a  return to see me in 3 months. Goal weight loss is 5% of today's weight by then, which is 10 lbs    There's a new low carb breakfast available!   [employer] now makes a frozen breakfast frittata that can be microwaved in 2 minutes and is very low carb. Frittats are similar to quiches without the crust

## 2015-10-04 MED ORDER — POTASSIUM CHLORIDE CRYS ER 20 MEQ PO TBCR: 20.0000 meq | EXTENDED_RELEASE_TABLET | Freq: Every day | ORAL | Status: DC

## 2015-10-04 NOTE — Assessment & Plan Note (Signed)
LDL and triglycerides are at goal on current medications. She has no side effects and liver enzymes are normal. No changes today  Lab Results  Component Value Date   CHOL 151 10/02/2015   HDL 51.90 10/02/2015   LDLCALC 80 10/02/2015   LDLDIRECT 81.0 06/30/2015   TRIG 98.0 10/02/2015   CHOLHDL 3 10/02/2015    Lab Results  Component Value Date   ALT 22 10/02/2015   AST 18 10/02/2015   ALKPHOS 75 10/02/2015   BILITOT 1.2 10/02/2015

## 2015-10-04 NOTE — Assessment & Plan Note (Signed)
Well controlled on current regimen. Renal function stable, ut potassium is low due to hctz.  Will rx potassium .  Lab Results  Component Value Date   CREATININE 0.61 10/02/2015   Lab Results  Component Value Date   NA 135 10/02/2015   K 3.3* 10/02/2015   CL 98 10/02/2015   CO2 27 10/02/2015

## 2015-10-04 NOTE — Progress Notes (Signed)
Subjective:  Patient ID: Sheri [name], female    DOB: [birth year]  Age: 60 y.o. MRN: [id number]  CC: The primary encounter diagnosis was Type 2 DM with diabetic neuropathy affecting both sides of body (HCC). Diagnoses of Obesity, Essential hypertension, benign, and Hyperlipidemia associated with type 2 diabetes mellitus (HCC) were also pertinent to this visit.  HPI Sheri [name] presents for 3 month follow up on diabetes.  Patient has no complaints today.  Patient is following a low glycemic index diet and taking all prescribed medications regularly without side effects.  Fasting sugars have been under less than 140 most of the time and post prandials have been under 160 except on rare occasions. Patient is not exercising due to her work schedule and care for her dying aunt and has gained 11 lbs since July .  Patient has had an eye exam in the last 12 months and checks feet regularly for signs of infection.  Patient does not walk barefoot outside,  And denies any numbness tingling or burning in feet. Patient is up to date on all recommended vaccinations  Outpatient Prescriptions Prior to Visit  Medication Sig Dispense Refill  . ALPRAZolam (XANAX) 0.5 MG tablet TAKE 1 TABLET BY MOUTH TWICE DAILY AS NEEDED FOR ANXIETY OR SLEEP 45 tablet 5  . amitriptyline (ELAVIL) 25 MG tablet Take 3 tablets (75 mg total) by mouth at bedtime. For neuropathy 270 tablet 1  . atorvastatin (LIPITOR) 20 MG tablet Take 1 tablet (20 mg total) by mouth daily. 90 tablet 3  . furosemide (LASIX) 20 MG tablet TAKE 1 TABLET BY MOUTH ONCE DAILY AS NEEDED FOR FLUID RETENTION 30 tablet 3  . glucose blood (ONE TOUCH TEST STRIPS) test strip Use as instructed     . lisinopril-hydrochlorothiazide (PRINZIDE,ZESTORETIC) 20-25 MG tablet TAKE 1 TABLET BY MOUTH DAILY. 30 tablet 5  . meloxicam (MOBIC) 15 MG tablet Take 1 tablet (15 mg total) by mouth daily. 30 tablet 3  . zolpidem (AMBIEN CR) 12.5 MG CR tablet Take 1 tablet (12.5 mg  total) by mouth at bedtime as needed. for sleep 30 tablet 5   No facility-administered medications prior to visit.    Review of Systems;  Patient denies headache, fevers, malaise, unintentional weight loss, skin rash, eye pain, sinus congestion and sinus pain, sore throat, dysphagia,  hemoptysis , cough, dyspnea, wheezing, chest pain, palpitations, orthopnea, edema, abdominal pain, nausea, melena, diarrhea, constipation, flank pain, dysuria, hematuria, urinary  Frequency, nocturia, numbness, tingling, seizures,  Focal weakness, Loss of consciousness,  Tremor, insomnia, depression, anxiety, and suicidal ideation.      Objective:  BP 108/74 mmHg  Pulse 114  Temp(Src) 98 F (36.7 C)  Wt 207 lb 8 oz (94.121 kg)  SpO2 94%  BP Readings from Last 3 Encounters:  10/02/15 108/74  08/25/15 138/98  06/30/15 110/70    Wt Readings from Last 3 Encounters:  10/02/15 207 lb 8 oz (94.121 kg)  06/30/15 203 lb 8 oz (92.307 kg)  03/27/15 196 lb 4 oz (89.018 kg)    General appearance: alert, cooperative and appears stated age Ears: normal TM's and external ear canals both ears Throat: lips, mucosa, and tongue normal; teeth and gums normal Neck: no adenopathy, no carotid bruit, supple, symmetrical, trachea midline and thyroid not enlarged, symmetric, no tenderness/mass/nodules Back: symmetric, no curvature. ROM normal. No CVA tenderness. Lungs: clear to auscultation bilaterally Heart: regular rate and rhythm, S1, S2 normal, no murmur, click, rub or gallop Abdomen: soft, non-tender; bowel  sounds normal; no masses,  no organomegaly Pulses: 2+ and symmetric Skin: Skin color, texture, turgor normal. No rashes or lesions Lymph nodes: Cervical, supraclavicular, and axillary nodes normal.  Lab Results  Component Value Date   HGBA1C 5.9 10/02/2015   HGBA1C 5.7 06/30/2015   HGBA1C 5.6 03/27/2015    Lab Results  Component Value Date   CREATININE 0.61 10/02/2015   CREATININE 0.67 06/30/2015    CREATININE 0.71 03/27/2015    Lab Results  Component Value Date   WBC 5.1 05/11/2013   HGB 13.7 05/11/2013   HCT 40.0 05/11/2013   PLT 301.0 05/11/2013   GLUCOSE 141* 10/02/2015   CHOL 151 10/02/2015   TRIG 98.0 10/02/2015   HDL 51.90 10/02/2015   LDLDIRECT 81.0 06/30/2015   LDLCALC 80 10/02/2015   ALT 22 10/02/2015   AST 18 10/02/2015   NA 135 10/02/2015   K 3.3* 10/02/2015   CL 98 10/02/2015   CREATININE 0.61 10/02/2015   BUN 22 10/02/2015   CO2 27 10/02/2015   TSH 1.02 05/11/2013   HGBA1C 5.9 10/02/2015   MICROALBUR <0.7 03/27/2015    No results found.  Assessment & Plan:   Problem List Items Addressed This Visit    Type 2 DM with diabetic neuropathy affecting both sides of body (HCC) - Primary    well-controlled on diet alone .  hemoglobin A1c has been consistently at or  less than 7.0 . Patient is is already on on ACE/ARB for renal protection and hypertension .  Reminder for diabetic eye exam given.  Lab Results  Component Value Date   HGBA1C 5.9 10/02/2015   Lab Results  Component Value Date   MICROALBUR <0.7 03/27/2015           well-controlled on diet alone .  hemoglobin A1c has been consistently at or  less than 7.0 . Patient is is already on on ACE/ARB for renal protection and hypertension .  Reminder for diabetic eye exam given.  Lab Results  Component Value Date   HGBA1C 5.9 10/02/2015   Lab Results  Component Value Date   MICROALBUR <0.7 03/27/2015                 Relevant Orders   Lipid panel (Completed)   Hemoglobin A1c (Completed)   Obesity    Reviewed her previous success at weight loss,  Her goal weight for BMI < 30 (168 lbs). I have addressed  BMI and recommended wt loss of 10% of body weight over the next 6 months using a low glycemic index diet and regular exercise a minimum of 5 days per week. She has had difficulty losing weight due to increased appetite and is requesting a trial of  Phentermine.  She is aware of the  possible side effects and risks and understands that    The medication will be discontinued if she has not lost 5% of her body weight over the next 3 months, which , based on today's weight is 10 lbs.       Relevant Medications   phentermine (ADIPEX-P) 37.5 MG tablet   Other Relevant Orders   Comprehensive metabolic panel (Completed)   Essential hypertension, benign    Well controlled on current regimen. Renal function stable, ut potassium is low due to hctz.  Will rx potassium .  Lab Results  Component Value Date   CREATININE 0.61 10/02/2015   Lab Results  Component Value Date   NA 135 10/02/2015   K 3.3* 10/02/2015  CL 98 10/02/2015   CO2 27 10/02/2015         Hyperlipidemia associated with type 2 diabetes mellitus (HCC)    LDL and triglycerides are at goal on current medications. She has no side effects and liver enzymes are normal. No changes today  Lab Results  Component Value Date   CHOL 151 10/02/2015   HDL 51.90 10/02/2015   LDLCALC 80 10/02/2015   LDLDIRECT 81.0 06/30/2015   TRIG 98.0 10/02/2015   CHOLHDL 3 10/02/2015    Lab Results  Component Value Date   ALT 22 10/02/2015   AST 18 10/02/2015   ALKPHOS 75 10/02/2015   BILITOT 1.2 10/02/2015               I am having Ms. Coger start on phentermine and potassium chloride SA. I am also having her maintain her glucose blood, meloxicam, atorvastatin, furosemide, zolpidem, amitriptyline, lisinopril-hydrochlorothiazide, and ALPRAZolam.  Meds ordered this encounter  Medications  . phentermine (ADIPEX-P) 37.5 MG tablet    Sig: 1/2 tablet in the am and early afternoon    Dispense:  30 tablet    Refill:  2  . potassium chloride SA (K-DUR,KLOR-CON) 20 MEQ tablet    Sig: Take 1 tablet (20 mEq total) by mouth daily.    Dispense:  30 tablet    Refill:  3    There are no discontinued medications.  Follow-up: Return in about 3 months (around 12/31/2015) for follow up diabetes.   [name],  MD

## 2015-10-04 NOTE — Assessment & Plan Note (Signed)
Reviewed her previous success at weight loss,  Her goal weight for BMI < 30 (168 lbs). I have addressed  BMI and recommended wt loss of 10% of body weight over the next 6 months using a low glycemic index diet and regular exercise a minimum of 5 days per week. She has had difficulty losing weight due to increased appetite and is requesting a trial of  Phentermine.  She is aware of the possible side effects and risks and understands that    The medication will be discontinued if she has not lost 5% of her body weight over the next 3 months, which , based on today's weight is 10 lbs.

## 2015-10-04 NOTE — Assessment & Plan Note (Signed)
well-controlled on diet alone .  hemoglobin A1c has been consistently at or  less than 7.0 . Patient is is already on on ACE/ARB for renal protection and hypertension .  Reminder for diabetic eye exam given.  Lab Results  Component Value Date   HGBA1C 5.9 10/02/2015   Lab Results  Component Value Date   MICROALBUR <0.7 03/27/2015           well-controlled on diet alone .  hemoglobin A1c has been consistently at or  less than 7.0 . Patient is is already on on ACE/ARB for renal protection and hypertension .  Reminder for diabetic eye exam given.  Lab Results  Component Value Date   HGBA1C 5.9 10/02/2015   Lab Results  Component Value Date   MICROALBUR <0.7 03/27/2015

## 2015-10-05 ENCOUNTER — Encounter: Payer: Self-pay | Admitting: Internal Medicine

## 2015-10-05 DIAGNOSIS — E876 Hypokalemia: Secondary | ICD-10-CM | POA: Insufficient documentation

## 2015-10-12 LAB — HM DIABETES EYE EXAM

## 2015-10-16 ENCOUNTER — Other Ambulatory Visit: Payer: Self-pay | Admitting: Internal Medicine

## 2015-10-17 ENCOUNTER — Other Ambulatory Visit: Payer: Self-pay | Admitting: Internal Medicine

## 2015-10-21 NOTE — Telephone Encounter (Signed)
Mailed unread message to patient.  

## 2016-01-02 ENCOUNTER — Ambulatory Visit: Payer: Self-pay | Admitting: Internal Medicine

## 2016-01-05 ENCOUNTER — Ambulatory Visit (INDEPENDENT_AMBULATORY_CARE_PROVIDER_SITE_OTHER): Payer: 59 | Admitting: Internal Medicine

## 2016-01-05 ENCOUNTER — Encounter: Payer: Self-pay | Admitting: Internal Medicine

## 2016-01-05 VITALS — BP 110/80 | HR 91 | Temp 98.2°F | Resp 12 | Ht 63.0 in | Wt 205.0 lb

## 2016-01-05 DIAGNOSIS — E1169 Type 2 diabetes mellitus with other specified complication: Secondary | ICD-10-CM

## 2016-01-05 DIAGNOSIS — E1142 Type 2 diabetes mellitus with diabetic polyneuropathy: Secondary | ICD-10-CM | POA: Diagnosis not present

## 2016-01-05 DIAGNOSIS — E669 Obesity, unspecified: Secondary | ICD-10-CM

## 2016-01-05 DIAGNOSIS — E876 Hypokalemia: Secondary | ICD-10-CM

## 2016-01-05 DIAGNOSIS — Z1211 Encounter for screening for malignant neoplasm of colon: Secondary | ICD-10-CM | POA: Diagnosis not present

## 2016-01-05 DIAGNOSIS — E785 Hyperlipidemia, unspecified: Secondary | ICD-10-CM

## 2016-01-05 DIAGNOSIS — I10 Essential (primary) hypertension: Secondary | ICD-10-CM

## 2016-01-05 DIAGNOSIS — Z1239 Encounter for other screening for malignant neoplasm of breast: Secondary | ICD-10-CM

## 2016-01-05 LAB — COMPREHENSIVE METABOLIC PANEL
ALBUMIN: 4.4 g/dL (ref 3.5–5.2)
ALK PHOS: 70 U/L (ref 39–117)
ALT: 21 U/L (ref 0–35)
AST: 17 U/L (ref 0–37)
BUN: 19 mg/dL (ref 6–23)
CALCIUM: 9.5 mg/dL (ref 8.4–10.5)
CHLORIDE: 102 meq/L (ref 96–112)
CO2: 23 mEq/L (ref 19–32)
Creatinine, Ser: 0.5 mg/dL (ref 0.40–1.20)
GFR: 133.87 mL/min (ref >60.00)
Glucose, Bld: 93 mg/dL (ref 70–99)
POTASSIUM: 3.8 meq/L (ref 3.5–5.1)
SODIUM: 137 meq/L (ref 135–145)
Total Bilirubin: 0.9 mg/dL (ref 0.2–1.2)
Total Protein: 6.8 g/dL (ref 6.0–8.3)

## 2016-01-05 LAB — MICROALBUMIN / CREATININE URINE RATIO
CREATININE, U: 56.2 mg/dL
MICROALB/CREAT RATIO: 1.2 mg/g (ref 0.0–30.0)

## 2016-01-05 LAB — HEMOGLOBIN A1C: HEMOGLOBIN A1C: 5.9 % (ref 4.6–6.5)

## 2016-01-05 NOTE — Patient Instructions (Signed)
Mammogram and colonoscopy referral are in process  Do not start  The phentermine until you have started a consistent exercise and diet plan  You can take the whole tablet in the morning if you are forgetting the afternoon dose  FOCUS ON YOU NOW!  YOU ARE THE ONLY ONE WHO WILL!

## 2016-01-05 NOTE — Assessment & Plan Note (Addendum)
well-controlled on diet alone .  hemoglobin A1c has been consistently at or  less than 7.0 . Patient is is already on on ACE/ARB for renal protection and hypertension .  Reminder for diabetic eye exam given.  Lab Results  Component Value Date   HGBA1C 5.9 01/05/2016   Lab Results  Component Value Date   MICROALBUR <0.7 01/05/2016           well-controlled on diet alone .  hemoglobin A1c has been consistently at or  less than 7.0 . Patient is is already on on ACE/ARB for renal protection and hypertension .  Reminder for diabetic eye exam given.  Lab Results  Component Value Date   HGBA1C 5.9 01/05/2016   Lab Results  Component Value Date   MICROALBUR <0.7 01/05/2016

## 2016-01-05 NOTE — Progress Notes (Signed)
Pre-visit discussion using our clinic review tool. No additional management support is needed unless otherwise documented below in the visit note.  

## 2016-01-05 NOTE — Progress Notes (Signed)
Subjective:  Patient ID: Sheri [name], female    DOB: [birth year]  Age: 60 y.o. MRN: [id number]  CC: The primary encounter diagnosis was Hyperlipidemia associated with type 2 diabetes mellitus (HCC). Diagnoses of Essential hypertension, benign, Type 2 DM with diabetic neuropathy affecting both sides of body [facility]), Colon cancer screening, Breast cancer screening, Hypokalemia, and Obesity were also pertinent to this visit.  HPI Sheri [name] presents for  follow up on Type 2 DM with neuropathy, hypertension and hyperlipidemia.    Had a rough January /Feb  emotionally, but two major issues have improved  She has received an reduction in work load at [employer] since the administration finally hired a  3rd person . Her 31 yr old   aunt who was dementia and [facility]KD on dialysis  died on 02-14-24after 28 days of  Hospice . Patient feels guilty over "missing" the bilateral heel pressure ulcers that her aunt developed while  Rehabbing at [facility] following a pelvic ring fracture after falling at [facility].  Her survival of 28 days made patient feel that she was "not ready to die " and patient feels that she "forced" the issue. She feels guilty over the "horbble" death that she had.  Patient had aunt transferred to hospice Home one week prior to her death   Has not  Resumed phentermine since she spent  [name]/Feb at the bedside of her ailing aunt daily and let her diet and exercise program lapse.  She reports persistent fatigue as the cause for continued suspension of exercise, as well as new worries about her husband'd atrial fibrillation and failure to cardiovert.  She is taking Elavil 25 mg in the evening for neuropathy with good results.      Outpatient Prescriptions Prior to Visit  Medication Sig Dispense Refill  . ALPRAZolam (XANAX) 0.5 MG tablet TAKE 1 TABLET BY MOUTH TWICE DAILY AS NEEDED FOR ANXIETY OR SLEEP 45 tablet 5  . amitriptyline (ELAVIL) 25 MG tablet Take 3 tablets (75 mg total) by  mouth at bedtime. For neuropathy 270 tablet 1  . atorvastatin (LIPITOR) 20 MG tablet Take 1 tablet (20 mg total) by mouth daily. 90 tablet 3  . furosemide (LASIX) 20 MG tablet TAKE 1 TABLET BY MOUTH ONCE DAILY AS NEEDED FOR FLUID RETENTION 30 tablet 3  . glucose blood (ONE TOUCH TEST STRIPS) test strip Use as instructed     . lisinopril-hydrochlorothiazide (PRINZIDE,ZESTORETIC) 20-25 MG tablet TAKE 1 TABLET BY MOUTH DAILY. 30 tablet 5  . meloxicam (MOBIC) 15 MG tablet Take 1 tablet (15 mg total) by mouth daily. 30 tablet 3  . phentermine (ADIPEX-P) 37.5 MG tablet 1/2 tablet in the am and early afternoon 30 tablet 2  . potassium chloride SA (K-DUR,KLOR-CON) 20 MEQ tablet Take 1 tablet (20 mEq total) by mouth daily. 30 tablet 3  . zolpidem (AMBIEN CR) 12.5 MG CR tablet TAKE 1 TABLET BY MOUTH NIGHTLY AT BEDTIME AS NEEDED FOR SLEEP 30 tablet 5   No facility-administered medications prior to visit.    Review of Systems;  Patient denies headache, fevers, malaise, unintentional weight loss, skin rash, eye pain, sinus congestion and sinus pain, sore throat, dysphagia,  hemoptysis , cough, dyspnea, wheezing, chest pain, palpitations, orthopnea, edema, abdominal pain, nausea, melena, diarrhea, constipation, flank pain, dysuria, hematuria, urinary  Frequency, nocturia, numbness, tingling, seizures,  Focal weakness, Loss of consciousness,  Tremor, insomnia, depression, anxiety, and suicidal ideation.      Objective:  BP 110/80  mmHg  Pulse 91  Temp(Src) 98.2 F (36.8 C) (Oral)  Resp 12  Ht 5\' 3"  (1.6 m)  Wt 205 lb (92.987 kg)  BMI 36.32 kg/m2  SpO2 96%  BP Readings from Last 3 Encounters:  01/05/16 110/80  10/02/15 108/74  08/25/15 138/98    Wt Readings from Last 3 Encounters:  01/05/16 205 lb (92.987 kg)  10/02/15 207 lb 8 oz (94.121 kg)  06/30/15 203 lb 8 oz (92.307 kg)    General appearance: alert, cooperative and appears stated age Ears: normal TM's and external ear canals both  ears Throat: lips, mucosa, and tongue normal; teeth and gums normal Neck: no adenopathy, no carotid bruit, supple, symmetrical, trachea midline and thyroid not enlarged, symmetric, no tenderness/mass/nodules Back: symmetric, no curvature. ROM normal. No CVA tenderness. Lungs: clear to auscultation bilaterally Heart: regular rate and rhythm, S1, S2 normal, no murmur, click, rub or gallop Abdomen: soft, non-tender; bowel sounds normal; no masses,  no organomegaly Pulses: 2+ and symmetric Skin: Skin color, texture, turgor normal. No rashes or lesions Lymph nodes: Cervical, supraclavicular, and axillary nodes normal.  Lab Results  Component Value Date   HGBA1C 5.9 01/05/2016   HGBA1C 5.9 10/02/2015   HGBA1C 5.7 06/30/2015    Lab Results  Component Value Date   CREATININE 0.50 01/05/2016   CREATININE 0.61 10/02/2015   CREATININE 0.67 06/30/2015    Lab Results  Component Value Date   WBC 5.1 05/11/2013   HGB 13.7 05/11/2013   HCT 40.0 05/11/2013   PLT 301.0 05/11/2013   GLUCOSE 93 01/05/2016   CHOL 151 10/02/2015   TRIG 98.0 10/02/2015   HDL 51.90 10/02/2015   LDLDIRECT 81.0 06/30/2015   LDLCALC 80 10/02/2015   ALT 21 01/05/2016   AST 17 01/05/2016   NA 137 01/05/2016   K 3.8 01/05/2016   CL 102 01/05/2016   CREATININE 0.50 01/05/2016   BUN 19 01/05/2016   CO2 23 01/05/2016   TSH 1.02 05/11/2013   HGBA1C 5.9 01/05/2016   MICROALBUR <0.7 01/05/2016    Assessment & Plan:   Problem List Items Addressed This Visit    Type 2 DM with diabetic neuropathy affecting both sides of body (HCC)    well-controlled on diet alone .  hemoglobin A1c has been consistently at or  less than 7.0 . Patient is is already on on ACE/ARB for renal protection and hypertension .  Reminder for diabetic eye exam given.  Lab Results  Component Value Date   HGBA1C 5.9 01/05/2016   Lab Results  Component Value Date   MICROALBUR <0.7 01/05/2016           well-controlled on diet alone .   hemoglobin A1c has been consistently at or  less than 7.0 . Patient is is already on on ACE/ARB for renal protection and hypertension .  Reminder for diabetic eye exam given.  Lab Results  Component Value Date   HGBA1C 5.9 01/05/2016   Lab Results  Component Value Date   MICROALBUR <0.7 01/05/2016                   Relevant Orders   Comprehensive metabolic panel (Completed)   Microalbumin / creatinine urine ratio (Completed)   Hemoglobin A1c (Completed)   Essential hypertension, benign    Well controlled on current regimen. Renal function stable,   Lab Results  Component Value Date   CREATININE 0.50 01/05/2016   Lab Results  Component Value Date   NA 137 01/05/2016  K 3.8 01/05/2016   CL 102 01/05/2016   CO2 23 01/05/2016           Hyperlipidemia associated with type 2 diabetes mellitus (HCC) - Primary    LDL and triglycerides are at goal on current medications. She has no side effects and liver enzymes are normal. No changes today  Lab Results  Component Value Date   CHOL 151 10/02/2015   HDL 51.90 10/02/2015   LDLCALC 80 10/02/2015   LDLDIRECT 81.0 06/30/2015   TRIG 98.0 10/02/2015   CHOLHDL 3 10/02/2015    Lab Results  Component Value Date   ALT 21 01/05/2016   AST 17 01/05/2016   ALKPHOS 70 01/05/2016   BILITOT 0.9 01/05/2016              Obesity    I have authorized the use of phentermine for 3 months  and a  return to see me in 3 months. Goal weight loss is 5% of today's weight, or 11 lbs by then or phentermine will be discontinued.        Hypokalemia    Secondary to lisinopril/hct. Replaced.  If recurrent,  Will dc hctz and use lisinopril only.       Other Visit Diagnoses    Colon cancer screening        Relevant Orders    Ambulatory referral to Gastroenterology    Breast cancer screening        Relevant Orders    MM DIGITAL SCREENING BILATERAL       I am having Ms. Pulaski maintain her glucose blood, meloxicam,  atorvastatin, furosemide, amitriptyline, lisinopril-hydrochlorothiazide, ALPRAZolam, phentermine, potassium chloride SA, and zolpidem.  No orders of the defined types were placed in this encounter.    There are no discontinued medications.  Follow-up: Return in about 3 months (around 04/05/2016) for follow up diabetes.   [name], MD

## 2016-01-05 NOTE — Assessment & Plan Note (Signed)
Secondary to lisinopril/hct. Replaced.  If recurrent,  Will dc hctz and use lisinopril only.

## 2016-01-05 NOTE — Assessment & Plan Note (Signed)
I have authorized the use of phentermine for 3 months  and a  return to see me in 3 months. Goal weight loss is 5% of today's weight, or 11 lbs by then or phentermine will be discontinued.

## 2016-01-06 NOTE — Assessment & Plan Note (Signed)
LDL and triglycerides are at goal on current medications. She has no side effects and liver enzymes are normal. No changes today  Lab Results  Component Value Date   CHOL 151 10/02/2015   HDL 51.90 10/02/2015   LDLCALC 80 10/02/2015   LDLDIRECT 81.0 06/30/2015   TRIG 98.0 10/02/2015   CHOLHDL 3 10/02/2015    Lab Results  Component Value Date   ALT 21 01/05/2016   AST 17 01/05/2016   ALKPHOS 70 01/05/2016   BILITOT 0.9 01/05/2016

## 2016-01-06 NOTE — Assessment & Plan Note (Signed)
Well controlled on current regimen. Renal function stable,   Lab Results  Component Value Date   CREATININE 0.50 01/05/2016   Lab Results  Component Value Date   NA 137 01/05/2016   K 3.8 01/05/2016   CL 102 01/05/2016   CO2 23 01/05/2016

## 2016-01-08 ENCOUNTER — Encounter: Payer: Self-pay | Admitting: Internal Medicine

## 2016-01-15 ENCOUNTER — Ambulatory Visit: Payer: Self-pay | Admitting: Internal Medicine

## 2016-01-21 ENCOUNTER — Other Ambulatory Visit: Payer: Self-pay | Admitting: Internal Medicine

## 2016-03-25 ENCOUNTER — Other Ambulatory Visit: Payer: Self-pay | Admitting: Internal Medicine

## 2016-03-26 NOTE — Telephone Encounter (Signed)
Refilled 09/18/15 and last seen on 01/05/16. Please advise?

## 2016-03-26 NOTE — Telephone Encounter (Signed)
Refill authorized.

## 2016-04-05 ENCOUNTER — Encounter: Payer: Self-pay | Admitting: Internal Medicine

## 2016-04-05 ENCOUNTER — Ambulatory Visit (INDEPENDENT_AMBULATORY_CARE_PROVIDER_SITE_OTHER): Payer: 59 | Admitting: Internal Medicine

## 2016-04-05 VITALS — BP 110/78 | HR 97 | Temp 97.5°F | Resp 12 | Ht 63.0 in | Wt 215.5 lb

## 2016-04-05 DIAGNOSIS — E876 Hypokalemia: Secondary | ICD-10-CM

## 2016-04-05 DIAGNOSIS — E1169 Type 2 diabetes mellitus with other specified complication: Secondary | ICD-10-CM

## 2016-04-05 DIAGNOSIS — E1142 Type 2 diabetes mellitus with diabetic polyneuropathy: Secondary | ICD-10-CM | POA: Diagnosis not present

## 2016-04-05 DIAGNOSIS — I10 Essential (primary) hypertension: Secondary | ICD-10-CM

## 2016-04-05 DIAGNOSIS — E669 Obesity, unspecified: Secondary | ICD-10-CM | POA: Diagnosis not present

## 2016-04-05 DIAGNOSIS — E785 Hyperlipidemia, unspecified: Secondary | ICD-10-CM

## 2016-04-05 NOTE — Assessment & Plan Note (Addendum)
well-controlled on diet alone .  hemoglobin A1c has been consistently at or  less than 7.0 . Patient is is already on on ACE/ARB for renal protection and hypertension .  Reminder for diabetic eye exam given.  Lab Results  Component Value Date   HGBA1C 5.9 01/05/2016   Lab Results  Component Value Date   MICROALBUR <0.7 01/05/2016           well-controlled on diet alone .  hemoglobin A1c has been consistently at or  less than 7.0 . Patient is is already on on ACE/ARB for renal protection and hypertension .  Reminder for diabetic eye exam given.  Lab Results  Component Value Date   HGBA1C 5.9 01/05/2016   Lab Results  Component Value Date   MICROALBUR <0.7 01/05/2016

## 2016-04-05 NOTE — Assessment & Plan Note (Signed)
I have recommend continued suspension of appetite suppressant phentermine given her complicated work [employer] schedule and inability to comimit to t regular exercise schedule .  counselling  Given and BMI addressed. Return  to see me in 3 months.

## 2016-04-05 NOTE — Progress Notes (Signed)
Subjective:  Patient ID: Jerilee Field, female    DOB: 1956-01-24  Age: 60 y.o. MRN: [id number]  CC: The primary encounter diagnosis was Hyperlipidemia associated with type 2 diabetes mellitus (HCC). Diagnoses of Type 2 DM with diabetic neuropathy affecting both sides of body (HCC), Hypokalemia, Obesity, and Essential hypertension, benign were also pertinent to this visit.  HPI Clairice Tessler Eudy presents for diabetes follow up   New onset headaches involving left frontal area including eye 4th in on a month . Has had some nausea without  vomiting assoc with it.  Eye exam was done in February and was normal .  No blurred vision , Wears contacts. Light sensitive.  No history of migraines , side of face is tender tto even light touch,  Temple not tender.  Had one so severe she missed work.    Obesity: stopped taking the phentermine a  Month ago  Because she was not exercising.  Husband has been hospitalized 5 times since February for atrial fib and now has uncontrolled DM.  Marland Kitchen  Has had to take over a lot of responsibilities at home  Lab Results  Component Value Date   HGBA1C 5.9 01/05/2016     Outpatient Prescriptions Prior to Visit  Medication Sig Dispense Refill  . ALPRAZolam (XANAX) 0.5 MG tablet TAKE 1 TABLET BY MOUTH TWO TIMES DAILY AS NEEDED FOR ANXIETY OR SLEEP 45 tablet 5  . amitriptyline (ELAVIL) 25 MG tablet Take 3 tablets (75 mg total) by mouth at bedtime. For neuropathy 270 tablet 1  . atorvastatin (LIPITOR) 20 MG tablet TAKE 1 TABLET BY MOUTH DAILY. 90 tablet 3  . furosemide (LASIX) 20 MG tablet TAKE 1 TABLET BY MOUTH ONCE DAILY AS NEEDED FOR FLUID RETENTION 30 tablet 3  . glucose blood (ONE TOUCH TEST STRIPS) test strip Use as instructed     . lisinopril-hydrochlorothiazide (PRINZIDE,ZESTORETIC) 20-25 MG tablet TAKE 1 TABLET BY MOUTH DAILY. 30 tablet 5  . meloxicam (MOBIC) 15 MG tablet Take 1 tablet (15 mg total) by mouth daily. 30 tablet 3  . phentermine (ADIPEX-P) 37.5 MG  tablet 1/2 tablet in the am and early afternoon 30 tablet 2  . potassium chloride SA (K-DUR,KLOR-CON) 20 MEQ tablet Take 1 tablet (20 mEq total) by mouth daily. 30 tablet 3  . zolpidem (AMBIEN CR) 12.5 MG CR tablet TAKE 1 TABLET BY MOUTH NIGHTLY AT BEDTIME AS NEEDED FOR SLEEP 30 tablet 5   No facility-administered medications prior to visit.    Review of Systems;  Patient denies headache, fevers, malaise, unintentional weight loss, skin rash, eye pain, sinus congestion and sinus pain, sore throat, dysphagia,  hemoptysis , cough, dyspnea, wheezing, chest pain, palpitations, orthopnea, edema, abdominal pain, nausea, melena, diarrhea, constipation, flank pain, dysuria, hematuria, urinary  Frequency, nocturia, numbness, tingling, seizures,  Focal weakness, Loss of consciousness,  Tremor, insomnia, depression, anxiety, and suicidal ideation.      Objective:  BP 110/78 mmHg  Pulse 97  Temp(Src) 97.5 F (36.4 C) (Oral)  Resp 12  Ht 5\' 3"  (1.6 m)  Wt 215 lb 8 oz (97.75 kg)  BMI 38.18 kg/m2  SpO2 98%  BP Readings from Last 3 Encounters:  04/05/16 110/78  01/05/16 110/80  10/02/15 108/74    Wt Readings from Last 3 Encounters:  04/05/16 215 lb 8 oz (97.75 kg)  01/05/16 205 lb (92.987 kg)  10/02/15 207 lb 8 oz (94.121 kg)    General appearance: alert, cooperative and appears stated age Ears: normal  TM's and external ear canals both ears Throat: lips, mucosa, and tongue normal; teeth and gums normal Neck: no adenopathy, no carotid bruit, supple, symmetrical, trachea midline and thyroid not enlarged, symmetric, no tenderness/mass/nodules Back: symmetric, no curvature. ROM normal. No CVA tenderness. Lungs: clear to auscultation bilaterally Heart: regular rate and rhythm, S1, S2 normal, no murmur, click, rub or gallop Abdomen: soft, non-tender; bowel sounds normal; no masses,  no organomegaly Pulses: 2+ and symmetric Skin: Skin color, texture, turgor normal. No rashes or lesions Lymph  nodes: Cervical, supraclavicular, and axillary nodes normal.  Lab Results  Component Value Date   HGBA1C 5.9 01/05/2016   HGBA1C 5.9 10/02/2015   HGBA1C 5.7 06/30/2015    Lab Results  Component Value Date   CREATININE 0.50 01/05/2016   CREATININE 0.61 10/02/2015   CREATININE 0.67 06/30/2015    Lab Results  Component Value Date   WBC 5.1 05/11/2013   HGB 13.7 05/11/2013   HCT 40.0 05/11/2013   PLT 301.0 05/11/2013   GLUCOSE 93 01/05/2016   CHOL 151 10/02/2015   TRIG 98.0 10/02/2015   HDL 51.90 10/02/2015   LDLDIRECT 81.0 06/30/2015   LDLCALC 80 10/02/2015   ALT 21 01/05/2016   AST 17 01/05/2016   NA 137 01/05/2016   K 3.8 01/05/2016   CL 102 01/05/2016   CREATININE 0.50 01/05/2016   BUN 19 01/05/2016   CO2 23 01/05/2016   TSH 1.02 05/11/2013   HGBA1C 5.9 01/05/2016   MICROALBUR <0.7 01/05/2016     Assessment & Plan:   Problem List Items Addressed This Visit    Type 2 DM with diabetic neuropathy affecting both sides of body (HCC)    well-controlled on diet alone .  hemoglobin A1c has been consistently at or  less than 7.0 . Patient is is already on on ACE/ARB for renal protection and hypertension .  Reminder for diabetic eye exam given.  Lab Results  Component Value Date   HGBA1C 5.9 01/05/2016   Lab Results  Component Value Date   MICROALBUR <0.7 01/05/2016           well-controlled on diet alone .  hemoglobin A1c has been consistently at or  less than 7.0 . Patient is is already on on ACE/ARB for renal protection and hypertension .  Reminder for diabetic eye exam given.  Lab Results  Component Value Date   HGBA1C 5.9 01/05/2016   Lab Results  Component Value Date   MICROALBUR <0.7 01/05/2016                     Relevant Orders   Hemoglobin A1c   Lipid panel   Microalbumin / creatinine urine ratio   Obesity    I have recommend continued suspension of appetite suppressant phentermine given her complicated work [employer] schedule  and inability to comimit to t regular exercise schedule .  counselling  Given and BMI addressed. Return  to see me in 3 months.      Essential hypertension, benign    Well controlled on current regimen. Renal function stable,   Lab Results  Component Value Date   CREATININE 0.50 01/05/2016   Lab Results  Component Value Date   NA 137 01/05/2016   K 3.8 01/05/2016   CL 102 01/05/2016   CO2 23 01/05/2016             Hyperlipidemia associated with type 2 diabetes mellitus (HCC) - Primary   Relevant Orders   LDL cholesterol, direct  Hypokalemia   Relevant Orders   Comprehensive metabolic panel      I am having Ms. Dowson maintain her glucose blood, meloxicam, furosemide, amitriptyline, lisinopril-hydrochlorothiazide, phentermine, potassium chloride SA, zolpidem, atorvastatin, and ALPRAZolam.  No orders of the defined types were placed in this encounter.    There are no discontinued medications.  Follow-up: No Follow-up on file.   [name], MD

## 2016-04-05 NOTE — Patient Instructions (Signed)
Return for fasting labs after July 17th   Tips for Eating Away From Home If You Have Diabetes Controlling your level of blood glucose, also known as blood sugar, can be challenging. It can be even more difficult when you do not prepare your own meals. The following tips can help you manage your diabetes when you eat away from home. PLANNING AHEAD Plan ahead if you know you will be eating away from home:  Ask your health care provider how to time meals and medicine if you are taking insulin.  Make a list of restaurants near you that offer healthy choices. If they have a carry-out menu, take it home and plan what you will order ahead of time.  Look up the restaurant you want to eat at online. Many chain and fast-food restaurants list nutritional information online. Use this information to choose the healthiest options and to calculate how many carbohydrates will be in your meal.  Use a carbohydrate-counting book or mobile app to look up the carbohydrate content and serving size of the foods you want to eat.  Become familiar with serving sizes and learn to recognize how many servings are in a portion. This will allow you to estimate how many carbohydrates you can eat. FREE FOODS A "free food" is any food or drink that has less than 5 g of carbohydrates per serving. Free foods include:  Many vegetables.  Hard boiled eggs.  Nuts or seeds.  Olives.  Cheeses.  Meats. These types of foods make good appetizer choices and are often available at salad bars. Lemon juice, vinegar, or a low-calorie salad dressing of fewer than 20 calories per serving can be used as a "free" salad dressing.  CHOICES TO REDUCE CARBOHYDRATES  Substitute nonfat sweetened yogurt with a sugar-free yogurt. Yogurt made from soy milk may also be used, but you will still want a sugar-free or plain option to choose a lower carbohydrate amount.  Ask your server to take away the bread basket or chips from your  table.  Order fresh fruit. A salad bar often offers fresh fruit choices. Avoid canned fruit because it is usually packed in sugar or syrup.  Order a salad, and eat it without dressing. Or, create a "free" salad dressing.  Ask for substitutions. For example, instead of [place] fries, request an order of a vegetable such as salad, green beans, or broccoli. OTHER TIPS   If you take insulin, take the insulin once your food arrives to your table. This will ensure your insulin and food are timed correctly.  Ask your server about the portion size before your order, and ask for a take-out box if the portion has more servings than you should have. When your food comes, leave the amount you should have on the plate, and put the rest in the take-out box.  Consider splitting an entree with someone and ordering a side salad.   This information is not intended to replace advice given to you by your health care provider. Make sure you discuss any questions you have with your health care provider.   Document Released: 09/06/2005 Document Revised: 05/28/2015 Document Reviewed: 12/04/2013 Elsevier Interactive Patient Education [employer].

## 2016-04-05 NOTE — Progress Notes (Signed)
Pre-visit discussion using our clinic review tool. No additional management support is needed unless otherwise documented below in the visit note.  

## 2016-04-05 NOTE — Assessment & Plan Note (Signed)
Well controlled on current regimen. Renal function stable,   Lab Results  Component Value Date   CREATININE 0.50 01/05/2016   Lab Results  Component Value Date   NA 137 01/05/2016   K 3.8 01/05/2016   CL 102 01/05/2016   CO2 23 01/05/2016

## 2016-04-07 ENCOUNTER — Other Ambulatory Visit (INDEPENDENT_AMBULATORY_CARE_PROVIDER_SITE_OTHER): Payer: 59

## 2016-04-07 DIAGNOSIS — E1169 Type 2 diabetes mellitus with other specified complication: Secondary | ICD-10-CM

## 2016-04-07 DIAGNOSIS — E876 Hypokalemia: Secondary | ICD-10-CM

## 2016-04-07 DIAGNOSIS — E1142 Type 2 diabetes mellitus with diabetic polyneuropathy: Secondary | ICD-10-CM

## 2016-04-07 DIAGNOSIS — E785 Hyperlipidemia, unspecified: Secondary | ICD-10-CM

## 2016-04-07 LAB — MICROALBUMIN / CREATININE URINE RATIO
CREATININE, U: 148.4 mg/dL
Microalb Creat Ratio: 0.5 mg/g (ref 0.0–30.0)

## 2016-04-07 LAB — COMPREHENSIVE METABOLIC PANEL
ALT: 28 U/L (ref 0–35)
AST: 21 U/L (ref 0–37)
Albumin: 4.2 g/dL (ref 3.5–5.2)
Alkaline Phosphatase: 74 U/L (ref 39–117)
BILIRUBIN TOTAL: 0.9 mg/dL (ref 0.2–1.2)
BUN: 21 mg/dL (ref 6–23)
CALCIUM: 9.3 mg/dL (ref 8.4–10.5)
CHLORIDE: 107 meq/L (ref 96–112)
CO2: 23 meq/L (ref 19–32)
Creatinine, Ser: 0.57 mg/dL (ref 0.40–1.20)
GFR: 114.99 mL/min (ref >60.00)
GLUCOSE: 123 mg/dL — AB (ref 70–99)
POTASSIUM: 4.1 meq/L (ref 3.5–5.1)
Sodium: 139 mEq/L (ref 135–145)
Total Protein: 6.8 g/dL (ref 6.0–8.3)

## 2016-04-07 LAB — LIPID PANEL
CHOLESTEROL: 162 mg/dL (ref 0–200)
HDL: 46.1 mg/dL (ref >39.00)
LDL Cholesterol: 85 mg/dL (ref 0–99)
NonHDL: 115.7
TRIGLYCERIDES: 155 mg/dL — AB (ref 0.0–149.0)
Total CHOL/HDL Ratio: 4
VLDL: 31 mg/dL (ref 0.0–40.0)

## 2016-04-07 LAB — LDL CHOLESTEROL, DIRECT: LDL DIRECT: 101 mg/dL

## 2016-04-07 LAB — HEMOGLOBIN A1C: Hgb A1c MFr Bld: 5.9 % (ref 4.6–6.5)

## 2016-04-11 ENCOUNTER — Encounter: Payer: Self-pay | Admitting: Internal Medicine

## 2016-04-20 ENCOUNTER — Other Ambulatory Visit: Payer: Self-pay | Admitting: Internal Medicine

## 2016-07-09 ENCOUNTER — Ambulatory Visit
Admission: RE | Admit: 2016-07-09 | Discharge: 2016-07-09 | Disposition: A | Discharge status: Home / Self Care | Payer: 59 | Source: Ambulatory Visit | Attending: Internal Medicine | Admitting: Internal Medicine

## 2016-07-09 ENCOUNTER — Other Ambulatory Visit: Payer: Self-pay | Admitting: Internal Medicine

## 2016-07-09 DIAGNOSIS — Z1231 Encounter for screening mammogram for malignant neoplasm of breast: Secondary | ICD-10-CM | POA: Diagnosis not present

## 2016-07-09 DIAGNOSIS — R928 Other abnormal and inconclusive findings on diagnostic imaging of breast: Secondary | ICD-10-CM | POA: Insufficient documentation

## 2016-07-09 DIAGNOSIS — Z1239 Encounter for other screening for malignant neoplasm of breast: Secondary | ICD-10-CM

## 2016-07-09 LAB — HM MAMMOGRAPHY

## 2016-07-11 ENCOUNTER — Encounter: Payer: Self-pay | Admitting: Internal Medicine

## 2016-07-11 ENCOUNTER — Other Ambulatory Visit: Payer: Self-pay | Admitting: Internal Medicine

## 2016-07-12 ENCOUNTER — Other Ambulatory Visit: Payer: Self-pay | Admitting: Internal Medicine

## 2016-07-12 DIAGNOSIS — R928 Other abnormal and inconclusive findings on diagnostic imaging of breast: Secondary | ICD-10-CM

## 2016-07-21 ENCOUNTER — Ambulatory Visit
Admission: RE | Admit: 2016-07-21 | Discharge: 2016-07-21 | Disposition: A | Discharge status: Home / Self Care | Payer: 59 | Source: Ambulatory Visit | Attending: Internal Medicine | Admitting: Internal Medicine

## 2016-07-21 DIAGNOSIS — R928 Other abnormal and inconclusive findings on diagnostic imaging of breast: Secondary | ICD-10-CM | POA: Insufficient documentation

## 2016-07-21 DIAGNOSIS — N6489 Other specified disorders of breast: Secondary | ICD-10-CM | POA: Diagnosis not present

## 2016-08-11 ENCOUNTER — Ambulatory Visit (INDEPENDENT_AMBULATORY_CARE_PROVIDER_SITE_OTHER): Payer: 59 | Admitting: Internal Medicine

## 2016-08-11 ENCOUNTER — Encounter: Payer: Self-pay | Admitting: Internal Medicine

## 2016-08-11 VITALS — BP 130/80 | HR 89 | Temp 97.8°F | Resp 12 | Ht 63.0 in | Wt 220.8 lb

## 2016-08-11 DIAGNOSIS — E785 Hyperlipidemia, unspecified: Secondary | ICD-10-CM

## 2016-08-11 DIAGNOSIS — E1142 Type 2 diabetes mellitus with diabetic polyneuropathy: Secondary | ICD-10-CM | POA: Diagnosis not present

## 2016-08-11 DIAGNOSIS — F5104 Psychophysiologic insomnia: Secondary | ICD-10-CM

## 2016-08-11 DIAGNOSIS — K2100 Gastro-esophageal reflux disease with esophagitis, without bleeding: Secondary | ICD-10-CM

## 2016-08-11 DIAGNOSIS — IMO0001 Reserved for inherently not codable concepts without codable children: Secondary | ICD-10-CM

## 2016-08-11 DIAGNOSIS — E1169 Type 2 diabetes mellitus with other specified complication: Secondary | ICD-10-CM

## 2016-08-11 DIAGNOSIS — E559 Vitamin D deficiency, unspecified: Secondary | ICD-10-CM

## 2016-08-11 DIAGNOSIS — R5383 Other fatigue: Secondary | ICD-10-CM

## 2016-08-11 DIAGNOSIS — E876 Hypokalemia: Secondary | ICD-10-CM

## 2016-08-11 DIAGNOSIS — Z6839 Body mass index (BMI) 39.0-39.9, adult: Secondary | ICD-10-CM

## 2016-08-11 DIAGNOSIS — E6609 Other obesity due to excess calories: Secondary | ICD-10-CM

## 2016-08-11 LAB — COMPREHENSIVE METABOLIC PANEL
ALT: 22 U/L (ref 0–35)
AST: 15 U/L (ref 0–37)
Albumin: 4.3 g/dL (ref 3.5–5.2)
Alkaline Phosphatase: 70 U/L (ref 39–117)
BUN: 15 mg/dL (ref 6–23)
CALCIUM: 9.6 mg/dL (ref 8.4–10.5)
CHLORIDE: 107 meq/L (ref 96–112)
CO2: 23 meq/L (ref 19–32)
Creatinine, Ser: 0.62 mg/dL (ref 0.40–1.20)
GFR: 104.23 mL/min (ref >60.00)
GLUCOSE: 117 mg/dL — AB (ref 70–99)
POTASSIUM: 4.3 meq/L (ref 3.5–5.1)
Sodium: 140 mEq/L (ref 135–145)
Total Bilirubin: 0.7 mg/dL (ref 0.2–1.2)
Total Protein: 6.5 g/dL (ref 6.0–8.3)

## 2016-08-11 LAB — LIPID PANEL
CHOLESTEROL: 159 mg/dL (ref 0–200)
HDL: 49.1 mg/dL (ref >39.00)
LDL CALC: 89 mg/dL (ref 0–99)
NonHDL: 109.48
TRIGLYCERIDES: 101 mg/dL (ref 0.0–149.0)
Total CHOL/HDL Ratio: 3
VLDL: 20.2 mg/dL (ref 0.0–40.0)

## 2016-08-11 LAB — LDL CHOLESTEROL, DIRECT: LDL DIRECT: 97 mg/dL

## 2016-08-11 LAB — HEMOGLOBIN A1C: Hgb A1c MFr Bld: 6 % (ref 4.6–6.5)

## 2016-08-11 LAB — VITAMIN D 25 HYDROXY (VIT D DEFICIENCY, FRACTURES): VITD: 11.91 ng/mL — AB (ref 30.00–100.00)

## 2016-08-11 MED ORDER — OMEPRAZOLE 40 MG PO CPDR: 40.0000 mg | DELAYED_RELEASE_CAPSULE | Freq: Every day | ORAL | 3 refills | Status: DC

## 2016-08-11 MED ORDER — FUROSEMIDE 20 MG PO TABS: ORAL_TABLET | ORAL | 3 refills | Status: DC

## 2016-08-11 NOTE — Progress Notes (Signed)
Pre-visit discussion using our clinic review tool. No additional management support is needed unless otherwise documented below in the visit note.  

## 2016-08-11 NOTE — Patient Instructions (Signed)
Your next visit will be your physical  with PAP smear.

## 2016-08-11 NOTE — Progress Notes (Signed)
Subjective:  Patient ID: Sheri [name], female    DOB: 1955/09/25  Age: 60 y.o. MRN: [id number]  CC: The primary encounter diagnosis was Type 2 DM with diabetic neuropathy affecting both sides of body (HCC). Diagnoses of Hyperlipidemia associated with type 2 diabetes mellitus (HCC), Vitamin D deficiency, Fatigue, unspecified type, Reflux esophagitis, Class 2 obesity due to excess calories with serious comorbidity and body mass index (BMI) of 39.0 to 39.9 in adult, Insomnia, psychophysiological, and Hypokalemia were also pertinent to this visit.  HPI Sheri [name] presents for 3 month follow up on diabetes.  Patient has no complaints today.  Patient is following a low glycemic index diet and taking all prescribed medications regularly without side effects.  Fasting sugars have not been checked. Patient is not exercising due to work schedule.  Patient has had an eye exam in the last 12 months and checks feet regularly for signs of infection.  Patient does not walk barefoot outside,  She has neuropathy of lower extremities. t. Patient is up to date on all recommended vaccinations  Continues to feel overworked .  Feels the pressure to do the work of 3 people at work.   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  Working overtime an average of 14 to 16 hours per the 2 week period.   Not checking sugars .  Has gained 15 lbs since April because of the lack of  work life balance.  Fasting today .  2) Has been having episodes of Some heartburn occurring when lying supine. Using tums ,  Not occurring daily     Needs colonoscopy,  Eye exam is up to date  Vaccines up to date  PAP smear due      Outpatient Medications Prior to Visit  Medication Sig Dispense Refill  . ALPRAZolam (XANAX) 0.5 MG tablet TAKE 1 TABLET BY MOUTH TWO TIMES DAILY AS NEEDED FOR ANXIETY OR SLEEP 45 tablet 5  . amitriptyline (ELAVIL) 25 MG tablet Take 3  tablets (75 mg total) by mouth at bedtime. For neuropathy 270 tablet 1  . atorvastatin (LIPITOR) 20 MG tablet TAKE 1 TABLET BY MOUTH DAILY. 90 tablet 3  . glucose blood (ONE TOUCH TEST STRIPS) test strip Use as instructed     . lisinopril-hydrochlorothiazide (PRINZIDE,ZESTORETIC) 20-25 MG tablet TAKE 1 TABLET BY MOUTH DAILY. 30 tablet 5  . meloxicam (MOBIC) 15 MG tablet Take 1 tablet (15 mg total) by mouth daily. 30 tablet 3  . potassium chloride SA (K-DUR,KLOR-CON) 20 MEQ tablet Take 1 tablet (20 mEq total) by mouth daily. 30 tablet 3  . zolpidem (AMBIEN CR) 12.5 MG CR tablet TAKE 1 TABLET BY MOUTH NIGHTLY AT BEDTIME AS NEEDED FOR SLEEP 30 tablet 5  . furosemide (LASIX) 20 MG tablet TAKE 1 TABLET BY MOUTH ONCE DAILY AS NEEDED FOR FLUID RETENTION 30 tablet 3  . phentermine (ADIPEX-P) 37.5 MG tablet 1/2 tablet in the am and early afternoon (Patient not taking: Reported on 08/11/2016) 30 tablet 2   No facility-administered medications prior to visit.     Review of Systems;  Patient denies headache, fevers, malaise, unintentional weight loss, skin rash, eye pain, sinus congestion and sinus pain, sore throat, dysphagia,  hemoptysis , cough, dyspnea, wheezing, chest pain, palpitations, orthopnea, edema, abdominal pain, nausea, melena, diarrhea, constipation, flank pain, dysuria, hematuria, urinary  Frequency, nocturia, numbness, tingling, seizures,  Focal weakness, Loss of consciousness,  Tremor, insomnia, depression, anxiety, and suicidal ideation.      Objective:  BP 130/80   Pulse 89   Temp 97.8 F (36.6 C) (Oral)   Resp 12   Ht 5\' 3"  (1.6 m)   Wt 220 lb 12 oz (100.1 kg)   SpO2 96%   BMI 39.10 kg/m   BP Readings from Last 3 Encounters:  08/11/16 130/80  04/05/16 110/78  01/05/16 110/80    Wt Readings from Last 3 Encounters:  08/11/16 220 lb 12 oz (100.1 kg)  04/05/16 215 lb 8 oz (97.8 kg)  01/05/16 205 lb (93 kg)    General appearance: alert, cooperative and appears stated  age Ears: normal TM's and external ear canals both ears Throat: lips, mucosa, and tongue normal; teeth and gums normal Neck: no adenopathy, no carotid bruit, supple, symmetrical, trachea midline and thyroid not enlarged, symmetric, no tenderness/mass/nodules Back: symmetric, no curvature. ROM normal. No CVA tenderness. Lungs: clear to auscultation bilaterally Heart: regular rate and rhythm, S1, S2 normal, no murmur, click, rub or gallop Abdomen: soft, non-tender; bowel sounds normal; no masses,  no organomegaly Pulses: 2+ and symmetric Skin: Skin color, texture, turgor normal. No rashes or lesions Lymph nodes: Cervical, supraclavicular, and axillary nodes normal.  Lab Results  Component Value Date   HGBA1C 6.0 08/11/2016   HGBA1C 5.9 04/07/2016   HGBA1C 5.9 01/05/2016    Lab Results  Component Value Date   CREATININE 0.62 08/11/2016   CREATININE 0.57 04/07/2016   CREATININE 0.50 01/05/2016    Lab Results  Component Value Date   WBC 5.1 05/11/2013   HGB 13.7 05/11/2013   HCT 40.0 05/11/2013   PLT 301.0 05/11/2013   GLUCOSE 117 (H) 08/11/2016   CHOL 159 08/11/2016   TRIG 101.0 08/11/2016   HDL 49.10 08/11/2016   LDLDIRECT 97.0 08/11/2016   LDLCALC 89 08/11/2016   ALT 22 08/11/2016   AST 15 08/11/2016   NA 140 08/11/2016   K 4.3 08/11/2016   CL 107 08/11/2016   CREATININE 0.62 08/11/2016   BUN 15 08/11/2016   CO2 23 08/11/2016   TSH 1.02 05/11/2013   HGBA1C 6.0 08/11/2016   MICROALBUR <0.7 04/07/2016    US Breast Ltd Uni Right Inc Axilla  Result Date: 07/21/2016 CLINICAL DATA:  Patient returns after screening study for evaluation of possible distortion in the right breast. EXAM: 2D DIGITAL DIAGNOSTIC RIGHT MAMMOGRAM WITH CAD AND ADJUNCT TOMO ULTRASOUND RIGHT BREAST COMPARISON:  07/09/2016 and early ACR Breast Density Category c: The breast tissue is heterogeneously dense, which may obscure small masses. FINDINGS: Additional views are performed which demonstrate no  persistent distortion or mass in the upper-outer quadrant of the right breast. No suspicious mass, distortion, or microcalcifications are identified to suggest presence of malignancy. Mammographic images were processed with CAD. On physical exam, I palpate no abnormality in the upper-outer quadrant of the right breast. Targeted ultrasound is performed, showing normal appearing fibroglandular tissue throughout the lateral quadrants of the right breast. No mass, distortion, or acoustic shadowing is demonstrated with ultrasound. IMPRESSION: No mammographic or ultrasound evidence for malignancy. RECOMMENDATION: Screening mammogram in one year.(Code:SM-B-01Y) I have discussed the findings and recommendations with the patient. Results were also provided in writing at the conclusion of the visit. If applicable, a reminder letter will be sent to the patient regarding the next appointment. BI-RADS CATEGORY  1: Negative. Electronically Signed   By: Norva Pavlov M.D.   On: 07/21/2016 14:12   Mm Diag Breast Tomo Uni  Right  Result Date: 07/21/2016 CLINICAL DATA:  Patient returns after screening study for evaluation of possible distortion in the right breast. EXAM: 2D DIGITAL DIAGNOSTIC RIGHT MAMMOGRAM WITH CAD AND ADJUNCT TOMO ULTRASOUND RIGHT BREAST COMPARISON:  07/09/2016 and early ACR Breast Density Category c: The breast tissue is heterogeneously dense, which may obscure small masses. FINDINGS: Additional views are performed which demonstrate no persistent distortion or mass in the upper-outer quadrant of the right breast. No suspicious mass, distortion, or microcalcifications are identified to suggest presence of malignancy. Mammographic images were processed with CAD. On physical exam, I palpate no abnormality in the upper-outer quadrant of the right breast. Targeted ultrasound is performed, showing normal appearing fibroglandular tissue throughout the lateral quadrants of the right breast. No mass, distortion, or  acoustic shadowing is demonstrated with ultrasound. IMPRESSION: No mammographic or ultrasound evidence for malignancy. RECOMMENDATION: Screening mammogram in one year.(Code:SM-B-01Y) I have discussed the findings and recommendations with the patient. Results were also provided in writing at the conclusion of the visit. If applicable, a reminder letter will be sent to the patient regarding the next appointment. BI-RADS CATEGORY  1: Negative. Electronically Signed   By: Norva Pavlov M.D.   On: 07/21/2016 14:12    Assessment & Plan:   Problem List Items Addressed This Visit    Insomnia, psychophysiological    Managed with Remus Loffler for years, .The risks and benefits of benzodiazepine use were discussed with patient today including excessive sedation leading to respiratory depression,  impaired thinking/driving, and addiction.  Patient was advised to avoid concurrent use with alcohol, to use medication only as needed and not to share with others  .       Type 2 DM with diabetic neuropathy affecting both sides of body (HCC) - Primary    Still well-controlled on diet alone .  hemoglobin A1c has been consistently at or  less than 7.0 . Patient is is already on on ACE/ARB for renal protection and hypertension  And is up to date on eye exams.  Lab Results  Component Value Date   HGBA1C 6.0 08/11/2016   Lab Results  Component Value Date   MICROALBUR <0.7 04/07/2016           well-controlled on diet alone .  hemoglobin A1c has been consistently at or  less than 7.0 . Patient is is already on on ACE/ARB for renal protection and hypertension .  Reminder for diabetic eye exam given.  Lab Results  Component Value Date   HGBA1C 6.0 08/11/2016   Lab Results  Component Value Date   MICROALBUR <0.7 04/07/2016                     Relevant Orders   Comprehensive metabolic panel (Completed)   Hemoglobin A1c (Completed)   Reflux esophagitis      Reviewed behavioral modifications  including avoidance of mint, chocolate and alcohol and overeating.  Advised to remain upright for at least 2 hours after eating. Advised to start taking omeprazole      Obesity    Worsening, with 15 lb weight gain in 6 months.  Due to lack of exercise . I have addressed  BMI and recommended wt loss of 10% of body weight over the next 6 months using a low fat, low starch, high protein  fruit/vegetable based Mediterranean diet and 30 minutes of aerobic exercise a minimum of 5 days per week.        Hyperlipidemia associated with type 2 diabetes  mellitus (HCC)    LDL and triglycerides are at goal on atorvastatin 20 mg daily . She has no side effects and liver enzymes are normal. No changes today  Lab Results  Component Value Date   CHOL 159 08/11/2016   HDL 49.10 08/11/2016   LDLCALC 89 08/11/2016   LDLDIRECT 97.0 08/11/2016   TRIG 101.0 08/11/2016   CHOLHDL 3 08/11/2016    Lab Results  Component Value Date   ALT 22 08/11/2016   AST 15 08/11/2016   ALKPHOS 70 08/11/2016   BILITOT 0.7 08/11/2016              Relevant Medications   furosemide (LASIX) 20 MG tablet   Other Relevant Orders   LDL cholesterol, direct (Completed)   Lipid panel (Completed)   Hypokalemia    Secondary to lisinopril/hct. Replaced.  Lab Results  Component Value Date   NA 140 08/11/2016   K 4.3 08/11/2016   CL 107 08/11/2016   CO2 23 08/11/2016          Other Visit Diagnoses    Vitamin D deficiency       Relevant Orders   VITAMIN D 25 Hydroxy (Vit-D Deficiency, Fractures) (Completed)   Fatigue, unspecified type       Relevant Orders   Hepatitis C antibody (Completed)   HIV antibody (Completed)     A total of 25 minutes of face to face time was spent with patient more than half of which was spent in counselling about the above mentioned conditions  and coordination of care   I have discontinued Ms. Reiber's phentermine. I am also having her start on omeprazole. Additionally, I am  having her maintain her glucose blood, meloxicam, amitriptyline, potassium chloride SA, atorvastatin, ALPRAZolam, lisinopril-hydrochlorothiazide, zolpidem, and furosemide.  Meds ordered this encounter  Medications  . furosemide (LASIX) 20 MG tablet    Sig: TAKE 1 TABLET BY MOUTH ONCE DAILY AS NEEDED FOR FLUID RETENTION    Dispense:  30 tablet    Refill:  3  . omeprazole (PRILOSEC) 40 MG capsule    Sig: Take 1 capsule (40 mg total) by mouth daily.    Dispense:  30 capsule    Refill:  3    Medications Discontinued During This Encounter  Medication Reason  . furosemide (LASIX) 20 MG tablet Reorder  . phentermine (ADIPEX-P) 37.5 MG tablet     Follow-up: Return in about 3 months (around 11/11/2016) for annyual CPE with PAP .   [name], MD

## 2016-08-12 LAB — HEPATITIS C ANTIBODY: HCV AB: NEGATIVE

## 2016-08-12 LAB — HIV ANTIBODY (ROUTINE TESTING W REFLEX): HIV: NONREACTIVE

## 2016-08-14 NOTE — Assessment & Plan Note (Addendum)
Still well-controlled on diet alone .  hemoglobin A1c has been consistently at or  less than 7.0 . Patient is is already on on ACE/ARB for renal protection and hypertension  And is up to date on eye exams.  Lab Results  Component Value Date   HGBA1C 6.0 08/11/2016   Lab Results  Component Value Date   MICROALBUR <0.7 04/07/2016           well-controlled on diet alone .  hemoglobin A1c has been consistently at or  less than 7.0 . Patient is is already on on ACE/ARB for renal protection and hypertension .  Reminder for diabetic eye exam given.  Lab Results  Component Value Date   HGBA1C 6.0 08/11/2016   Lab Results  Component Value Date   MICROALBUR <0.7 04/07/2016

## 2016-08-14 NOTE — Assessment & Plan Note (Signed)
Reviewed behavioral modifications including avoidance of mint, chocolate and alcohol and overeating.  Advised to remain upright for at least 2 hours after eating. Advised to start taking omeprazole

## 2016-08-14 NOTE — Assessment & Plan Note (Signed)
Managed with Lorrin Mais for years, .The risks and benefits of benzodiazepine use were discussed with patient today including excessive sedation leading to respiratory depression,  impaired thinking/driving, and addiction.  Patient was advised to avoid concurrent use with alcohol, to use medication only as needed and not to share with others  .

## 2016-08-14 NOTE — Assessment & Plan Note (Signed)
LDL and triglycerides are at goal on atorvastatin 20 mg daily . She has no side effects and liver enzymes are normal. No changes today  Lab Results  Component Value Date   CHOL 159 08/11/2016   HDL 49.10 08/11/2016   LDLCALC 89 08/11/2016   LDLDIRECT 97.0 08/11/2016   TRIG 101.0 08/11/2016   CHOLHDL 3 08/11/2016    Lab Results  Component Value Date   ALT 22 08/11/2016   AST 15 08/11/2016   ALKPHOS 70 08/11/2016   BILITOT 0.7 08/11/2016

## 2016-08-14 NOTE — Assessment & Plan Note (Signed)
Secondary to lisinopril/hct. Replaced.  Lab Results  Component Value Date   NA 140 08/11/2016   K 4.3 08/11/2016   CL 107 08/11/2016   CO2 23 08/11/2016

## 2016-08-14 NOTE — Assessment & Plan Note (Signed)
Worsening, with 15 lb weight gain in 6 months.  Due to lack of exercise . I have addressed  BMI and recommended wt loss of 10% of body weight over the next 6 months using a low fat, low starch, high protein  fruit/vegetable based Mediterranean diet and 30 minutes of aerobic exercise a minimum of 5 days per week.

## 2016-08-15 ENCOUNTER — Encounter: Payer: Self-pay | Admitting: Internal Medicine

## 2016-08-15 ENCOUNTER — Other Ambulatory Visit: Payer: Self-pay | Admitting: Internal Medicine

## 2016-08-15 DIAGNOSIS — E559 Vitamin D deficiency, unspecified: Secondary | ICD-10-CM | POA: Insufficient documentation

## 2016-08-15 MED ORDER — ERGOCALCIFEROL 1.25 MG (50000 UT) PO CAPS
50000.0000 [IU] | ORAL_CAPSULE | ORAL | 3 refills | Status: DC
Start: 2016-08-15 — End: 2016-11-12

## 2016-09-24 ENCOUNTER — Other Ambulatory Visit: Payer: Self-pay | Admitting: Internal Medicine

## 2016-09-24 NOTE — Telephone Encounter (Signed)
Refilled and faxed.

## 2016-09-24 NOTE — Telephone Encounter (Signed)
Refill request forXanax, last seen [id number], last filled [id number].  Please advise.

## 2016-10-05 ENCOUNTER — Other Ambulatory Visit: Payer: Self-pay | Admitting: *Deleted

## 2016-10-21 ENCOUNTER — Other Ambulatory Visit: Payer: Self-pay | Admitting: Internal Medicine

## 2016-10-21 NOTE — Telephone Encounter (Signed)
Rx refill request Ambien 12.5 mg LOV 08/11/2016 Next Ov: 11/12/2016 LRF: 04/20/2016 Please Advise

## 2016-10-21 NOTE — Telephone Encounter (Signed)
refilled 

## 2016-11-12 ENCOUNTER — Encounter: Payer: Self-pay | Admitting: Internal Medicine

## 2016-11-12 ENCOUNTER — Ambulatory Visit (INDEPENDENT_AMBULATORY_CARE_PROVIDER_SITE_OTHER): Payer: 59 | Admitting: Internal Medicine

## 2016-11-12 ENCOUNTER — Other Ambulatory Visit (HOSPITAL_COMMUNITY)
Admission: RE | Admit: 2016-11-12 | Discharge: 2016-11-12 | Disposition: A | Discharge status: Home / Self Care | Payer: 59 | Source: Ambulatory Visit | Attending: Internal Medicine | Admitting: Internal Medicine

## 2016-11-12 VITALS — BP 118/84 | HR 94 | Resp 16 | Ht 63.0 in | Wt 218.0 lb

## 2016-11-12 DIAGNOSIS — K2100 Gastro-esophageal reflux disease with esophagitis, without bleeding: Secondary | ICD-10-CM

## 2016-11-12 DIAGNOSIS — Z6839 Body mass index (BMI) 39.0-39.9, adult: Secondary | ICD-10-CM

## 2016-11-12 DIAGNOSIS — Z124 Encounter for screening for malignant neoplasm of cervix: Secondary | ICD-10-CM

## 2016-11-12 DIAGNOSIS — Z0000 Encounter for general adult medical examination without abnormal findings: Secondary | ICD-10-CM

## 2016-11-12 DIAGNOSIS — IMO0001 Reserved for inherently not codable concepts without codable children: Secondary | ICD-10-CM

## 2016-11-12 DIAGNOSIS — Z01419 Encounter for gynecological examination (general) (routine) without abnormal findings: Secondary | ICD-10-CM | POA: Diagnosis not present

## 2016-11-12 DIAGNOSIS — I10 Essential (primary) hypertension: Secondary | ICD-10-CM

## 2016-11-12 DIAGNOSIS — E6609 Other obesity due to excess calories: Secondary | ICD-10-CM | POA: Diagnosis not present

## 2016-11-12 DIAGNOSIS — Z1151 Encounter for screening for human papillomavirus (HPV): Secondary | ICD-10-CM | POA: Insufficient documentation

## 2016-11-12 DIAGNOSIS — E1142 Type 2 diabetes mellitus with diabetic polyneuropathy: Secondary | ICD-10-CM

## 2016-11-12 LAB — POCT GLYCOSYLATED HEMOGLOBIN (HGB A1C): Hemoglobin A1C: 5.8

## 2016-11-12 MED ORDER — AMITRIPTYLINE HCL 25 MG PO TABS: 75.0000 mg | ORAL_TABLET | Freq: Every day | ORAL | 1 refills | Status: DC

## 2016-11-12 NOTE — Progress Notes (Signed)
Pre visit review using our clinic review tool, if applicable. No additional management support is needed unless otherwise documented below in the visit note. 

## 2016-11-12 NOTE — Patient Instructions (Signed)
This is  My version of an Atkins diet,  .  It is a low glycemic index  Diet:  It will allow you to lose 4 to 8  lbs  per month if you follow it carefully.  Your goal with exercise is a minimum of 30 minutes of aerobic exercise 5 days per week (Walking does not count once it becomes easy!)    All of the foods can be found at grocery stores and in bulk at [employer].  The Atkins protein bars and shakes are available in more varieties at [employer], [employer] and [place].     7 AM Breakfast:  Choose from the following:  Low carbohydrate Protein  Shakes (I recommend the  Premier Protein chocolate shakes,  EAS AdvantEdge "Carb Control" shakes  Or the Atkins shakes all are under 3 net carbs)     a scrambled egg/bacon/cheese burrito made with Mission's "carb balance" whole wheat tortilla  (about 10 net carbs )  Regulatory affairs officer (basically a quiche without the pastry crust) that is eaten cold and very convenient way to get your eggs.  8 carbs)  If you make your own protein shakes, avoid bananas and pineapple,  And use low carb greek yogurt or original /unsweetened almond or soy milk    Avoid cereal and bananas, oatmeal and cream of wheat and grits. They are loaded with carbohydrates!   10 AM: high protein snack:  Protein bar by Atkins (the snack size, under 200 cal, usually < 6 net carbs).    A stick of cheese:  Around 1 carb,  100 cal     Dannon Light n Fit [place] Yogurt  (80 cal, 8 carbs)  Other so called "protein bars" and Greek yogurts tend to be loaded with carbohydrates.  Remember, in food advertising, the word "energy" is synonymous for " carbohydrate."  Lunch:   A Sandwich using the bread choices listed, Can use any  Eggs,  lunchmeat, grilled meat or canned tuna), avocado, regular mayo/mustard  and cheese.  A Salad using blue cheese, ranch,  Goddess or vinagrette,  Avoid taco shells, croutons or "confetti" and no "candied nuts" but regular nuts OK.   No pretzels, nabs  or  chips.  Pickles and miniature sweet peppers are a good low carb alternative that provide a "crunch"  The bread is the only source of carbohydrate in a sandwich and  can be decreased by trying some of the attached alternatives to traditional loaf bread   Avoid "Low fat dressings, as well as [place] dressings They are loaded with sugar!   3 PM/ Mid day  Snack:  Consider  1 ounce of  almonds, walnuts, pistachios, pecans, peanuts,  Macadamia nuts or a nut medley.  Avoid "granola and granola bars "  Mixed nuts are ok in moderation as long as there are no raisins,  cranberries or dried fruit.   KIND bars are OK if you get the low glycemic index variety   Try the prosciutto/mozzarella cheese sticks by Fior[employer]ci  In deli /backery section   High protein      6 PM  Dinner:     Meat/fowl/fish with a green salad, and either broccoli, cauliflower, green beans, spinach, brussel sprouts or  Lima beans. DO NOT BREAD THE PROTEIN!!      There is a low carb pasta by Dreamfield's that is acceptable and tastes great: only 5 digestible carbs/serving.( All grocery stores but BJs carry it )  Several ready made meals are available low carb:   Try Michel Angelo's chicken piccata or chicken or eggplant parm over low carb pasta.(Lowes and BJs)   Marjory Lies Sanchez's "Carnitas" (pulled pork, no sauce,  0 carbs) or his beef pot roast to make a dinner burrito (at [employer]J's)  Pesto over low carb pasta (bj's sells a good quality pesto in the center refrigerated section of the deli   Try satueeing  Cheral Marker with mushroooms as a good side   [employer] Giant makes a mashed cauliflower that tastes like mashed potatoes  Whole wheat pasta is still full of digestible carbs and  Not as low in glycemic index as Dreamfield's.   Brown rice is still rice,  So skip the rice and noodles if you eat [place] or [place] (or at least limit to 1/2 cup)  9 PM snack :   Breyer's "low carb" fudgsicle or  ice cream bar (Carb Smart line), or   Weight Watcher's ice cream bar , or another "no sugar added" ice cream;  a serving of fresh berries/cherries with whipped cream   Cheese or DANNON'S LlGHT N FIT GREEK YOGURT  8 ounces of [place] unsweetened almond/cococunut milk    Treat yourself to a parfait made with whipped cream blueberiies, walnuts and vanilla greek yogurt  Avoid bananas, pineapple, grapes  and watermelon on a regular basis because they are high in sugar.  THINK OF THEM AS DESSERT  Remember that snack Substitutions should be less than 10 NET carbs per serving and meals < 20 carbs. Remember to subtract fiber grams to get the "net carbs."

## 2016-11-14 DIAGNOSIS — Z0000 Encounter for general adult medical examination without abnormal findings: Secondary | ICD-10-CM | POA: Insufficient documentation

## 2016-11-14 NOTE — Assessment & Plan Note (Signed)
I have addressed  BMI and recommended wt loss of 10% of body weight over the next 6 months using a low fat, low starch, high protein  fruit/vegetable based Mediterranean diet and 30 minutes of aerobic exercise a minimum of 5 days per week.   

## 2016-11-14 NOTE — Assessment & Plan Note (Signed)
Still well-controlled on diet alone .  hemoglobin A1c has been consistently at or  less than 7.0 . Patient is is already on on ACE/ARB for renal protection and hypertension  And is up to date on eye exams.  Lab Results  Component Value Date   HGBA1C 5.8 11/12/2016   Lab Results  Component Value Date   MICROALBUR <0.7 04/07/2016         Remains well-controlled on diet alone .  hemoglobin A1c has been consistently at or  less than 6.0 . Patient is is already on on ACE/ARB for renal protection and hypertension .  Reminder for diabetic eye exam given.  Lab Results  Component Value Date   HGBA1C 5.8 11/12/2016   Lab Results  Component Value Date   MICROALBUR <0.7 04/07/2016

## 2016-11-14 NOTE — Assessment & Plan Note (Signed)
Annual comprehensive preventive exam was done as well as an evaluation and management of chronic conditions .  During the course of the visit the patient was educated and counseled about appropriate screening and preventive services including :   lipid analysis with projected  10 year  risk for CAD , nutrition counseling, breast, cervical and colorectal cancer screening, and recommended immunizations.  Printed recommendations for health maintenance screenings was given

## 2016-11-14 NOTE — Assessment & Plan Note (Signed)
Reviewed behavioral modifications including avoidance of mint, chocolate and alcohol and overeating.  Advised to remain upright for at least 2 hours after eating. The risks of long term PPI use for acid suppression in patients without documented Barretts esophagus were discussed, including the possibility of osteoporosis, iron , B12 and magnesium deficiencies,  CKD, and dementia. Suggested trial of pepcid 20 mg daily and if GERD symptoms return,  To resume daily PPI and  referral for EGD.

## 2016-11-14 NOTE — Progress Notes (Signed)
Patient ID: Sheri [name], female    DOB: 1956/02/24  Age: 61 y.o. MRN: [id number]  The patient is here for annual  examination and management of other chronic and acute problems.   The risk factors are reflected in the social history.  The roster of all physicians providing medical care to patient - is listed in the Snapshot section of the chart.  Activities of daily living:  The patient is 100% independent in all ADLs: dressing, toileting, feeding as well as independent mobility  Home safety : The patient has smoke detectors in the home. They wear seatbelts.  There are no firearms at home. There is no violence in the home.   There is no risks for hepatitis, STDs or HIV. There is no   history of blood transfusion. They have no travel history to infectious disease endemic areas of the world.  The patient has seen their dentist in the last six month. They have seen their eye doctor in the last year.    They do not  have excessive sun exposure. Discussed the need for sun protection: hats, long sleeves and use of sunscreen if there is significant sun exposure.   Diet: the importance of a healthy diet is discussed. They do have a healthy diet.  The benefits of regular aerobic exercise were discussed. She is not exercising regularly  Walks on average 1-2 times per week for the last  2 weeks   Depression screen: there are no signs or vegative symptoms of depression- irritability, change in appetite, anhedonia, sadness/tearfullness.  The following portions of the patient's history were reviewed and updated as appropriate: allergies, current medications, past family history, past medical history,  past surgical history, past social history  and problem list.  Visual acuity was not assessed per patient preference since she has regular follow up with her ophthalmologist. Hearing and body mass index were assessed and reviewed.   During the course of the visit the patient was educated and counseled  about appropriate screening and preventive services including : fall prevention ,  nutrition counseling, colorectal cancer screening, and recommended immunizations.    CC: The primary encounter diagnosis was Screening for cervical cancer. Diagnoses of Type 2 DM with diabetic neuropathy affecting both sides of body (HCC), Essential hypertension, benign, Class 2 obesity due to excess calories with serious comorbidity and body mass index (BMI) of 39.0 to 39.9 in adult, Reflux esophagitis, and Encounter for preventive health examination were also pertinent to this visit.   She is frustrated at her inability to lose weight.   Diet and activity reviewed in detail;  She is not exercising regularly.  She has not restricted calories or carbohydrates.    History Tyjah has a past medical history of Abdominal pain, epigastric; Diabetes mellitus, type 2 (HCC); Diverticulosis of colon without hemorrhage; Duodenitis with hemorrhage; Edema; Heart murmur; Hyperlipidemia; Hypertension; Mitral regurgitation; Obesity, unspecified; Osteopenia; Panic disorder without agoraphobia; Personal history of malignant neoplasm of cervix uteri; Personal history of other genital system and obstetric disorders(V13.29); Rash and other nonspecific skin eruption; Reflux esophagitis; Unspecified hereditary and idiopathic peripheral neuropathy; and Unspecified sleep apnea.   She has a past surgical history that includes Right oophorectomy (2001) and Breast cyst aspiration.   Her family history includes Alzheimer's disease in her father; Coronary artery disease in her father; Diabetes in her father; Hypothyroidism in her mother; Osteopenia in her mother.She reports that she has never smoked. She has never used smokeless tobacco. She reports that she drinks alcohol.  She reports that she does not use drugs.  Outpatient Medications Prior to Visit  Medication Sig Dispense Refill  . ALPRAZolam (XANAX) 0.5 MG tablet TAKE 1 TABLET BY MOUTH TWO TIMES  DAILY AS NEEDED FOR ANXIETY OR SLEEP 45 tablet 5  . atorvastatin (LIPITOR) 20 MG tablet TAKE 1 TABLET BY MOUTH DAILY. 90 tablet 3  . furosemide (LASIX) 20 MG tablet TAKE 1 TABLET BY MOUTH ONCE DAILY AS NEEDED FOR FLUID RETENTION 30 tablet 3  . glucose blood (ONE TOUCH TEST STRIPS) test strip Use as instructed     . lisinopril-hydrochlorothiazide (PRINZIDE,ZESTORETIC) 20-25 MG tablet TAKE 1 TABLET BY MOUTH DAILY. 30 tablet 5  . meloxicam (MOBIC) 15 MG tablet Take 1 tablet (15 mg total) by mouth daily. 30 tablet 3  . omeprazole (PRILOSEC) 40 MG capsule Take 1 capsule (40 mg total) by mouth daily. 30 capsule 3  . potassium chloride SA (K-DUR,KLOR-CON) 20 MEQ tablet Take 1 tablet (20 mEq total) by mouth daily. 30 tablet 3  . zolpidem (AMBIEN CR) 12.5 MG CR tablet TAKE 1 TABLET BY MOUTH NIGHTLY AT BEDTIME AS NEEDED FOR SLEEP 30 tablet 5  . amitriptyline (ELAVIL) 25 MG tablet Take 3 tablets (75 mg total) by mouth at bedtime. For neuropathy 270 tablet 1  . ergocalciferol (DRISDOL) 50000 units capsule Take 1 capsule (50,000 Units total) by mouth once a week. 4 capsule 3   No facility-administered medications prior to visit.     Review of Systems   Patient denies headache, fevers, malaise, unintentional weight loss, skin rash, eye pain, sinus congestion and sinus pain, sore throat, dysphagia,  hemoptysis , cough, dyspnea, wheezing, chest pain, palpitations, orthopnea, edema, abdominal pain, nausea, melena, diarrhea, constipation, flank pain, dysuria, hematuria, urinary  Frequency, nocturia, numbness, tingling, seizures,  Focal weakness, Loss of consciousness,  Tremor, insomnia, depression, anxiety, and suicidal ideation.      Objective:  BP 118/84   Pulse 94   Resp 16   Ht 5\' 3"  (1.6 m)   Wt 218 lb (98.9 kg)   SpO2 95%   BMI 38.62 kg/m   Physical Exam   General appearance: alert, cooperative and appears stated age Head: Normocephalic, without obvious abnormality, atraumatic Eyes:  conjunctivae/corneas clear. PERRL, EOM's intact. Fundi benign. Ears: normal TM's and external ear canals both ears Nose: Nares normal. Septum midline. Mucosa normal. No drainage or sinus tenderness. Throat: lips, mucosa, and tongue normal; teeth and gums normal Neck: no adenopathy, no carotid bruit, no JVD, supple, symmetrical, trachea midline and thyroid not enlarged, symmetric, no tenderness/mass/nodules Lungs: clear to auscultation bilaterally Breasts: normal appearance, no masses or tenderness Heart: regular rate and rhythm, S1, S2 normal, no murmur, click, rub or gallop Abdomen: soft, non-tender; bowel sounds normal; no masses,  no organomegaly Extremities: extremities normal, atraumatic, no cyanosis or edema Pulses: 2+ and symmetric Skin: Skin color, texture, turgor normal. No rashes or lesions Neurologic: Alert and oriented X 3, normal strength and tone. Normal symmetric reflexes. Normal coordination and gait.     Assessment & Plan:   Problem List Items Addressed This Visit    Encounter for preventive health examination    Annual comprehensive preventive exam was done as well as an evaluation and management of chronic conditions .  During the course of the visit the patient was educated and counseled about appropriate screening and preventive services including :   lipid analysis with projected  10 year  risk for CAD , nutrition counseling, breast, cervical and colorectal cancer screening,  and recommended immunizations.  Printed recommendations for health maintenance screenings was given      Essential hypertension, benign    Well controlled on current regimen. Renal function stable, no changes today.  Lab Results  Component Value Date   CREATININE 0.62 08/11/2016   Lab Results  Component Value Date   NA 140 08/11/2016   K 4.3 08/11/2016   CL 107 08/11/2016   CO2 23 08/11/2016         Obesity    I have addressed  BMI and recommended wt loss of 10% of body weight over  the next 6 months using a low fat, low starch, high protein  fruit/vegetable based Mediterranean diet and 30 minutes of aerobic exercise a minimum of 5 days per week.        Reflux esophagitis      Reviewed behavioral modifications including avoidance of mint, chocolate and alcohol and overeating.  Advised to remain upright for at least 2 hours after eating. The risks of long term PPI use for acid suppression in patients without documented Barretts esophagus were discussed, including the possibility of osteoporosis, iron , B12 and magnesium deficiencies,  CKD, and dementia. Suggested trial of pepcid 20 mg daily and if GERD symptoms return,  To resume daily PPI and  referral for EGD.        Type 2 DM with diabetic neuropathy affecting both sides of body (HCC)    Still well-controlled on diet alone .  hemoglobin A1c has been consistently at or  less than 7.0 . Patient is is already on on ACE/ARB for renal protection and hypertension  And is up to date on eye exams.  Lab Results  Component Value Date   HGBA1C 5.8 11/12/2016   Lab Results  Component Value Date   MICROALBUR <0.7 04/07/2016         Remains well-controlled on diet alone .  hemoglobin A1c has been consistently at or  less than 6.0 . Patient is is already on on ACE/ARB for renal protection and hypertension .  Reminder for diabetic eye exam given.  Lab Results  Component Value Date   HGBA1C 5.8 11/12/2016   Lab Results  Component Value Date   MICROALBUR <0.7 04/07/2016                     Relevant Orders   POCT glycosylated hemoglobin (Hb A1C) (Completed)    Other Visit Diagnoses    Screening for cervical cancer    -  Primary   Relevant Orders   Cytology - PAP      I have discontinued Ms. Banton's ergocalciferol. I am also having her maintain her glucose blood, meloxicam, potassium chloride SA, atorvastatin, lisinopril-hydrochlorothiazide, furosemide, omeprazole, ALPRAZolam, zolpidem,  Cholecalciferol, and amitriptyline.  Meds ordered this encounter  Medications  . Cholecalciferol 2000 units TABS    Sig: Take 1 tablet by mouth daily.  Marland Kitchen amitriptyline (ELAVIL) 25 MG tablet    Sig: Take 3 tablets (75 mg total) by mouth at bedtime. For neuropathy    Dispense:  270 tablet    Refill:  1    Medications Discontinued During This Encounter  Medication Reason  . ergocalciferol (DRISDOL) 50000 units capsule Patient has not taken in last 30 days  . amitriptyline (ELAVIL) 25 MG tablet Reorder    Follow-up: No Follow-up on file.   [name], MD

## 2016-11-14 NOTE — Assessment & Plan Note (Signed)
Well controlled on current regimen. Renal function stable, no changes today.  Lab Results  Component Value Date   CREATININE 0.62 08/11/2016   Lab Results  Component Value Date   NA 140 08/11/2016   K 4.3 08/11/2016   CL 107 08/11/2016   CO2 23 08/11/2016

## 2016-11-15 LAB — CYTOLOGY - PAP
DIAGNOSIS: NEGATIVE
HPV (WINDOPATH): NOT DETECTED

## 2016-11-18 ENCOUNTER — Encounter: Payer: Self-pay | Admitting: Internal Medicine

## 2016-11-22 ENCOUNTER — Other Ambulatory Visit: Payer: Self-pay | Admitting: Internal Medicine

## 2017-02-11 ENCOUNTER — Encounter: Payer: Self-pay | Admitting: Internal Medicine

## 2017-02-11 ENCOUNTER — Ambulatory Visit (INDEPENDENT_AMBULATORY_CARE_PROVIDER_SITE_OTHER): Payer: 59 | Admitting: Internal Medicine

## 2017-02-11 VITALS — BP 120/80 | HR 92 | Temp 98.1°F | Resp 15 | Ht 63.0 in | Wt 216.8 lb

## 2017-02-11 DIAGNOSIS — E1169 Type 2 diabetes mellitus with other specified complication: Secondary | ICD-10-CM | POA: Diagnosis not present

## 2017-02-11 DIAGNOSIS — E785 Hyperlipidemia, unspecified: Secondary | ICD-10-CM

## 2017-02-11 DIAGNOSIS — Z6839 Body mass index (BMI) 39.0-39.9, adult: Secondary | ICD-10-CM

## 2017-02-11 DIAGNOSIS — E1142 Type 2 diabetes mellitus with diabetic polyneuropathy: Secondary | ICD-10-CM

## 2017-02-11 DIAGNOSIS — E559 Vitamin D deficiency, unspecified: Secondary | ICD-10-CM

## 2017-02-11 DIAGNOSIS — F5104 Psychophysiologic insomnia: Secondary | ICD-10-CM

## 2017-02-11 LAB — LIPID PANEL
CHOL/HDL RATIO: 4
Cholesterol: 156 mg/dL (ref 0–200)
HDL: 38.5 mg/dL — ABNORMAL LOW (ref >39.00)
LDL Cholesterol: 89 mg/dL (ref 0–99)
NONHDL: 117.21
Triglycerides: 142 mg/dL (ref 0.0–149.0)
VLDL: 28.4 mg/dL (ref 0.0–40.0)

## 2017-02-11 LAB — MICROALBUMIN / CREATININE URINE RATIO
CREATININE, U: 187.5 mg/dL
MICROALB/CREAT RATIO: 0.6 mg/g (ref 0.0–30.0)
Microalb, Ur: 1.1 mg/dL (ref 0.0–1.9)

## 2017-02-11 LAB — VITAMIN D 25 HYDROXY (VIT D DEFICIENCY, FRACTURES): VITD: 19.18 ng/mL — ABNORMAL LOW (ref 30.00–100.00)

## 2017-02-11 LAB — COMPREHENSIVE METABOLIC PANEL
ALT: 23 U/L (ref 0–35)
AST: 17 U/L (ref 0–37)
Albumin: 4.4 g/dL (ref 3.5–5.2)
Alkaline Phosphatase: 63 U/L (ref 39–117)
BILIRUBIN TOTAL: 1.2 mg/dL (ref 0.2–1.2)
BUN: 20 mg/dL (ref 6–23)
CO2: 26 meq/L (ref 19–32)
CREATININE: 0.62 mg/dL (ref 0.40–1.20)
Calcium: 9.6 mg/dL (ref 8.4–10.5)
Chloride: 104 mEq/L (ref 96–112)
GFR: 104.06 mL/min (ref >60.00)
GLUCOSE: 115 mg/dL — AB (ref 70–99)
Potassium: 3.9 mEq/L (ref 3.5–5.1)
SODIUM: 138 meq/L (ref 135–145)
Total Protein: 7 g/dL (ref 6.0–8.3)

## 2017-02-11 LAB — HEMOGLOBIN A1C: HEMOGLOBIN A1C: 6.1 % (ref 4.6–6.5)

## 2017-02-11 MED ORDER — SCOPOLAMINE 1 MG/3DAYS TD PT72: 1.0000 | MEDICATED_PATCH | TRANSDERMAL | 0 refills | Status: DC

## 2017-02-11 NOTE — Patient Instructions (Addendum)
Schedule your furosemide use every other day.  You can have 120 g of protein daily easily  Take metamucil 30 mintues before lunch and dinner to help reduce your appetite  Limit your carbs to 40 to 50 g daily . Limit your snacks to 100 cal     Here are several low carb protein bars that make great snacks:   Power Crunch (5 g sugar,  A lot like the sugar Wafer cookies) KIND 5 g sugar  Quest (very high fiber, too!)  Orvan Seen

## 2017-02-11 NOTE — Progress Notes (Signed)
Subjective:  Patient ID: Sheri [name], female    DOB: [name] 16, 1956  Age: 61 y.o. MRN: [id number]  CC: The primary encounter diagnosis was Type 2 DM with diabetic neuropathy affecting both sides of body (HCC). Diagnoses of Hyperlipidemia associated with type 2 diabetes mellitus (HCC), Vitamin D deficiency, Class 2 severe obesity due to excess calories with serious comorbidity and body mass index (BMI) of 39.0 to 39.9 in adult [facility]), and Insomnia, psychophysiological were also pertinent to this visit.  HPI Sheri [name] presents for 3 month follow up on diabetes.  Patient has no complaints today.  Patient is following a low glycemic index diet and taking all prescribed medications regularly without side effects.  Fasting sugars have been under less than 140 most of the time and post prandials have been under 160 except on rare occasions. Patient is exercising about 3 times per week and intentionally trying to lose weight .  Patient has had an eye exam in the last 12 months and checks feet regularly for signs of infection.  Patient does not walk barefoot outside,  And denies an numbness tingling or burning in feet. 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 (initially for [employer] ,  Now also does the work for [employer]) .  The department is grossly understaffed and she is constantly being berated by several of the newer oncologists.  Insomnia: reduced ambien to 2.5 mg because she was finding recurrent evidence of  nocturnal "sleep eating,"   She had  nightmares for a while, but now dreams are back to normal.  Reduced her elavil as well but her leg pain increased so she added a morning dose of the other half   Frustrated at inability to maintain weight loss  In January lost 10 lb on Weight watchers program,  But then her weight plateaued for several weeks, because she was eating too many "zero  point" foods (along with the unrecgnized nocturnal eating.   walking 2 miles daily .    Outpatient Medications Prior to Visit  Medication Sig Dispense Refill  . ALPRAZolam (XANAX) 0.5 MG tablet TAKE 1 TABLET BY MOUTH TWO TIMES DAILY AS NEEDED FOR ANXIETY OR SLEEP 45 tablet 5  . amitriptyline (ELAVIL) 25 MG tablet Take 3 tablets (75 mg total) by mouth at bedtime. For neuropathy 270 tablet 1  . atorvastatin (LIPITOR) 20 MG tablet TAKE 1 TABLET BY MOUTH DAILY. 90 tablet 3  . Cholecalciferol 2000 units TABS Take 1 tablet by mouth daily.    . furosemide (LASIX) 20 MG tablet TAKE 1 TABLET BY MOUTH ONCE DAILY AS NEEDED FOR FLUID RETENTION 30 tablet 3  . glucose blood (ONE TOUCH TEST STRIPS) test strip Use as instructed     . lisinopril-hydrochlorothiazide (PRINZIDE,ZESTORETIC) 20-25 MG tablet TAKE 1 TABLET BY MOUTH DAILY. 30 tablet 5  . meloxicam (MOBIC) 15 MG tablet Take 1 tablet (15 mg total) by mouth daily. 30 tablet 3  . omeprazole (PRILOSEC) 40 MG capsule Take 1 capsule (40 mg total) by mouth daily. 30 capsule 3  . potassium chloride SA (K-DUR,KLOR-CON) 20 MEQ tablet Take 1 tablet (20 mEq total) by mouth daily. 30 tablet 3  . zolpidem (AMBIEN CR) 12.5 MG CR tablet TAKE 1 TABLET BY MOUTH NIGHTLY AT BEDTIME AS NEEDED FOR SLEEP 30 tablet 5   No facility-administered medications prior to visit.     Review  of Systems;  Patient denies headache, fevers, malaise, unintentional weight loss, skin rash, eye pain, sinus congestion and sinus pain, sore throat, dysphagia,  hemoptysis , cough, dyspnea, wheezing, chest pain, palpitations, orthopnea, edema, abdominal pain, nausea, melena, diarrhea, constipation, flank pain, dysuria, hematuria, urinary  Frequency, nocturia, numbness, tingling, seizures,  Focal weakness, Loss of consciousness,  Tremor, insomnia, depression, anxiety, and suicidal ideation.      Objective:  BP 120/80 (BP Location: Left Arm, Patient Position: Sitting, Cuff Size: Large)   Pulse  92   Temp 98.1 F (36.7 C) (Oral)   Resp 15   Ht 5\' 3"  (1.6 m)   Wt 216 lb 12.8 oz (98.3 kg)   SpO2 95%   BMI 38.40 kg/m   BP Readings from Last 3 Encounters:  02/11/17 120/80  11/12/16 118/84  08/11/16 130/80    Wt Readings from Last 3 Encounters:  02/11/17 216 lb 12.8 oz (98.3 kg)  11/12/16 218 lb (98.9 kg)  08/11/16 220 lb 12 oz (100.1 kg)    General appearance: alert, cooperative and appears stated age Ears: normal TM's and external ear canals both ears Throat: lips, mucosa, and tongue normal; teeth and gums normal Neck: no adenopathy, no carotid bruit, supple, symmetrical, trachea midline and thyroid not enlarged, symmetric, no tenderness/mass/nodules Back: symmetric, no curvature. ROM normal. No CVA tenderness. Lungs: clear to auscultation bilaterally Heart: regular rate and rhythm, S1, S2 normal, no murmur, click, rub or gallop Abdomen: soft, non-tender; bowel sounds normal; no masses,  no organomegaly Pulses: 2+ and symmetric Skin: Skin color, texture, turgor normal. No rashes or lesions Lymph nodes: Cervical, supraclavicular, and axillary nodes normal.  Lab Results  Component Value Date   HGBA1C 6.1 02/11/2017   HGBA1C 5.8 11/12/2016   HGBA1C 6.0 08/11/2016    Lab Results  Component Value Date   CREATININE 0.62 02/11/2017   CREATININE 0.62 08/11/2016   CREATININE 0.57 04/07/2016    Lab Results  Component Value Date   WBC 5.1 05/11/2013   HGB 13.7 05/11/2013   HCT 40.0 05/11/2013   PLT 301.0 05/11/2013   GLUCOSE 115 (H) 02/11/2017   CHOL 156 02/11/2017   TRIG 142.0 02/11/2017   HDL 38.50 (L) 02/11/2017   LDLDIRECT 97.0 08/11/2016   LDLCALC 89 02/11/2017   ALT 23 02/11/2017   AST 17 02/11/2017   NA 138 02/11/2017   K 3.9 02/11/2017   CL 104 02/11/2017   CREATININE 0.62 02/11/2017   BUN 20 02/11/2017   CO2 26 02/11/2017   TSH 1.02 05/11/2013   HGBA1C 6.1 02/11/2017   MICROALBUR 1.1 02/11/2017    No results found.  Assessment & Plan:    Problem List Items Addressed This Visit    Vitamin D deficiency   Relevant Orders   VITAMIN D 25 Hydroxy (Vit-D Deficiency, Fractures) (Completed)   Type 2 DM with diabetic neuropathy affecting both sides of body (HCC) - Primary    Still well-controlled on diet alone .  hemoglobin A1c has been consistently at or  less than 7.0 . Patient is is already on on ACE/ARB for renal protection and hypertension  And is up to date on eye exams.   Lab Results  Component Value Date   HGBA1C 6.1 02/11/2017   Lab Results  Component Value Date   MICROALBUR 1.1 02/11/2017         Remains well-controlled on diet alone .  hemoglobin A1c has been consistently at or  less than 6.0 . Patient is is already on  on ACE/ARB for renal protection and hypertension .  Reminder for diabetic eye exam given.  Lab Results  Component Value Date   HGBA1C 6.1 02/11/2017   Lab Results  Component Value Date   MICROALBUR 1.1 02/11/2017                     Relevant Orders   Hemoglobin A1c (Completed)   Comprehensive metabolic panel (Completed)   Microalbumin / creatinine urine ratio (Completed)   Obesity    I have addressed  BMI and recommended wt loss of 10% of body weight over the next 6 months using a low fat, low starch, high protein  fruit/vegetable based Mediterranean diet and 30 minutes of aerobic exercise a minimum of 5 days per week.        Insomnia, psychophysiological    Dose reduced to 2.5 mg ambein due to nocturnal sleep eating       Hyperlipidemia associated with type 2 diabetes mellitus (HCC)    LDL and triglycerides are at goal on atorvastatin 20 mg daily . She has no side effects and liver enzymes are normal. No changes today  Lab Results  Component Value Date   CHOL 156 02/11/2017   HDL 38.50 (L) 02/11/2017   LDLCALC 89 02/11/2017   LDLDIRECT 97.0 08/11/2016   TRIG 142.0 02/11/2017   CHOLHDL 4 02/11/2017    Lab Results  Component Value Date   ALT 23 02/11/2017    AST 17 02/11/2017   ALKPHOS 63 02/11/2017   BILITOT 1.2 02/11/2017              Relevant Orders   Lipid panel (Completed)     A total of 25 minutes of face to face time was spent with patient more than half of which was spent in counselling about the above mentioned conditions  and coordination of care   I am having Ms. Mccarley start on scopolamine. I am also having her maintain her glucose blood, meloxicam, potassium chloride SA, atorvastatin, furosemide, omeprazole, ALPRAZolam, zolpidem, Cholecalciferol, amitriptyline, and lisinopril-hydrochlorothiazide.  Meds ordered this encounter  Medications  . scopolamine (TRANSDERM-SCOP, 1.5 MG,) 1 MG/3DAYS    Sig: Place 1 patch (1.5 mg total) onto the skin every 3 (three) days.    Dispense:  10 patch    Refill:  0    There are no discontinued medications.  Follow-up: Return in about 6 months (around 08/14/2017) for follow up diabetes,  fasting labs prior to appt PLEASE.   [name], MD

## 2017-02-13 NOTE — Assessment & Plan Note (Signed)
Still well-controlled on diet alone .  hemoglobin A1c has been consistently at or  less than 7.0 . Patient is is already on on ACE/ARB for renal protection and hypertension  And is up to date on eye exams.   Lab Results  Component Value Date   HGBA1C 6.1 02/11/2017   Lab Results  Component Value Date   MICROALBUR 1.1 02/11/2017         Remains well-controlled on diet alone .  hemoglobin A1c has been consistently at or  less than 6.0 . Patient is is already on on ACE/ARB for renal protection and hypertension .  Reminder for diabetic eye exam given.  Lab Results  Component Value Date   HGBA1C 6.1 02/11/2017   Lab Results  Component Value Date   MICROALBUR 1.1 02/11/2017

## 2017-02-13 NOTE — Assessment & Plan Note (Signed)
Dose reduced to 2.5 mg ambein due to nocturnal sleep eating

## 2017-02-13 NOTE — Assessment & Plan Note (Signed)
LDL and triglycerides are at goal on atorvastatin 20 mg daily . She has no side effects and liver enzymes are normal. No changes today  Lab Results  Component Value Date   CHOL 156 02/11/2017   HDL 38.50 (L) 02/11/2017   LDLCALC 89 02/11/2017   LDLDIRECT 97.0 08/11/2016   TRIG 142.0 02/11/2017   CHOLHDL 4 02/11/2017    Lab Results  Component Value Date   ALT 23 02/11/2017   AST 17 02/11/2017   ALKPHOS 63 02/11/2017   BILITOT 1.2 02/11/2017

## 2017-02-13 NOTE — Assessment & Plan Note (Signed)
I have addressed  BMI and recommended wt loss of 10% of body weight over the next 6 months using a low fat, low starch, high protein  fruit/vegetable based Mediterranean diet and 30 minutes of aerobic exercise a minimum of 5 days per week.   

## 2017-02-14 ENCOUNTER — Other Ambulatory Visit: Payer: Self-pay | Admitting: Internal Medicine

## 2017-02-14 ENCOUNTER — Encounter: Payer: Self-pay | Admitting: Internal Medicine

## 2017-02-14 DIAGNOSIS — E559 Vitamin D deficiency, unspecified: Secondary | ICD-10-CM

## 2017-02-14 MED ORDER — ERGOCALCIFEROL 1.25 MG (50000 UT) PO CAPS: 50000.0000 [IU] | ORAL_CAPSULE | ORAL | 2 refills | Status: DC

## 2017-02-14 NOTE — Assessment & Plan Note (Signed)
Recurrent,  Drisdol 50K weekly x 3 months

## 2017-04-11 ENCOUNTER — Other Ambulatory Visit: Payer: Self-pay | Admitting: Internal Medicine

## 2017-04-20 ENCOUNTER — Other Ambulatory Visit: Payer: Self-pay | Admitting: Internal Medicine

## 2017-05-05 ENCOUNTER — Other Ambulatory Visit: Payer: Self-pay | Admitting: Internal Medicine

## 2017-05-06 ENCOUNTER — Telehealth: Payer: Self-pay | Admitting: Internal Medicine

## 2017-06-15 NOTE — Telephone Encounter (Signed)
Error

## 2017-06-23 ENCOUNTER — Other Ambulatory Visit: Payer: Self-pay | Admitting: Internal Medicine

## 2017-06-23 DIAGNOSIS — Z1231 Encounter for screening mammogram for malignant neoplasm of breast: Secondary | ICD-10-CM

## 2017-07-11 ENCOUNTER — Ambulatory Visit
Admission: RE | Admit: 2017-07-11 | Discharge: 2017-07-11 | Disposition: A | Discharge status: Home / Self Care | Payer: 59 | Source: Ambulatory Visit | Attending: Internal Medicine | Admitting: Internal Medicine

## 2017-07-11 DIAGNOSIS — Z1231 Encounter for screening mammogram for malignant neoplasm of breast: Secondary | ICD-10-CM | POA: Diagnosis not present

## 2017-08-01 ENCOUNTER — Telehealth: Payer: Self-pay | Admitting: Radiology

## 2017-08-01 DIAGNOSIS — E1169 Type 2 diabetes mellitus with other specified complication: Secondary | ICD-10-CM

## 2017-08-01 DIAGNOSIS — E785 Hyperlipidemia, unspecified: Secondary | ICD-10-CM

## 2017-08-01 DIAGNOSIS — E1142 Type 2 diabetes mellitus with diabetic polyneuropathy: Secondary | ICD-10-CM

## 2017-08-01 DIAGNOSIS — I10 Essential (primary) hypertension: Secondary | ICD-10-CM

## 2017-08-01 NOTE — Telephone Encounter (Signed)
Pt is coming in for labs next Wednesday, please place future orders. Thank you.

## 2017-08-01 NOTE — Addendum Note (Signed)
Addended by: Crecencio Mc on: 08/01/2017 12:31 PM   Modules accepted: Orders

## 2017-08-01 NOTE — Telephone Encounter (Signed)
ordered

## 2017-08-10 ENCOUNTER — Other Ambulatory Visit (INDEPENDENT_AMBULATORY_CARE_PROVIDER_SITE_OTHER): Payer: 59

## 2017-08-10 DIAGNOSIS — I10 Essential (primary) hypertension: Secondary | ICD-10-CM

## 2017-08-10 DIAGNOSIS — E1169 Type 2 diabetes mellitus with other specified complication: Secondary | ICD-10-CM

## 2017-08-10 DIAGNOSIS — E1142 Type 2 diabetes mellitus with diabetic polyneuropathy: Secondary | ICD-10-CM

## 2017-08-10 DIAGNOSIS — E785 Hyperlipidemia, unspecified: Secondary | ICD-10-CM

## 2017-08-10 LAB — LIPID PANEL
CHOL/HDL RATIO: 4
Cholesterol: 159 mg/dL (ref 0–200)
HDL: 40.7 mg/dL (ref >39.00)
LDL CALC: 89 mg/dL (ref 0–99)
NONHDL: 118.68
TRIGLYCERIDES: 146 mg/dL (ref 0.0–149.0)
VLDL: 29.2 mg/dL (ref 0.0–40.0)

## 2017-08-10 LAB — COMPREHENSIVE METABOLIC PANEL
ALT: 18 U/L (ref 0–35)
AST: 15 U/L (ref 0–37)
Albumin: 4.1 g/dL (ref 3.5–5.2)
Alkaline Phosphatase: 71 U/L (ref 39–117)
BILIRUBIN TOTAL: 0.4 mg/dL (ref 0.2–1.2)
BUN: 20 mg/dL (ref 6–23)
CALCIUM: 9.5 mg/dL (ref 8.4–10.5)
CHLORIDE: 108 meq/L (ref 96–112)
CO2: 25 meq/L (ref 19–32)
CREATININE: 0.53 mg/dL (ref 0.40–1.20)
GFR: 124.5 mL/min (ref >60.00)
GLUCOSE: 132 mg/dL — AB (ref 70–99)
Potassium: 4 mEq/L (ref 3.5–5.1)
Sodium: 139 mEq/L (ref 135–145)
Total Protein: 6.6 g/dL (ref 6.0–8.3)

## 2017-08-10 LAB — MICROALBUMIN / CREATININE URINE RATIO
CREATININE, U: 180.2 mg/dL
MICROALB/CREAT RATIO: 0.7 mg/g (ref 0.0–30.0)
Microalb, Ur: 1.3 mg/dL (ref 0.0–1.9)

## 2017-08-10 LAB — HEMOGLOBIN A1C: Hgb A1c MFr Bld: 6 % (ref 4.6–6.5)

## 2017-08-13 ENCOUNTER — Encounter: Payer: Self-pay | Admitting: Internal Medicine

## 2017-08-15 ENCOUNTER — Encounter: Payer: Self-pay | Admitting: Internal Medicine

## 2017-08-15 ENCOUNTER — Ambulatory Visit (INDEPENDENT_AMBULATORY_CARE_PROVIDER_SITE_OTHER): Payer: 59

## 2017-08-15 ENCOUNTER — Ambulatory Visit (HOSPITAL_COMMUNITY)
Admission: RE | Admit: 2017-08-15 | Discharge: 2017-08-15 | Disposition: A | Discharge status: Home / Self Care | Payer: 59 | Source: Ambulatory Visit | Attending: Internal Medicine | Admitting: Internal Medicine

## 2017-08-15 ENCOUNTER — Ambulatory Visit (INDEPENDENT_AMBULATORY_CARE_PROVIDER_SITE_OTHER): Payer: 59 | Admitting: Internal Medicine

## 2017-08-15 VITALS — BP 118/76 | HR 92 | Temp 97.9°F | Resp 18 | Ht 64.0 in | Wt 221.5 lb

## 2017-08-15 DIAGNOSIS — M545 Low back pain, unspecified: Secondary | ICD-10-CM

## 2017-08-15 DIAGNOSIS — M47816 Spondylosis without myelopathy or radiculopathy, lumbar region: Secondary | ICD-10-CM | POA: Diagnosis not present

## 2017-08-15 DIAGNOSIS — G8929 Other chronic pain: Secondary | ICD-10-CM | POA: Diagnosis not present

## 2017-08-15 DIAGNOSIS — I10 Essential (primary) hypertension: Secondary | ICD-10-CM | POA: Diagnosis not present

## 2017-08-15 DIAGNOSIS — Z6839 Body mass index (BMI) 39.0-39.9, adult: Secondary | ICD-10-CM | POA: Diagnosis not present

## 2017-08-15 DIAGNOSIS — E785 Hyperlipidemia, unspecified: Secondary | ICD-10-CM | POA: Diagnosis not present

## 2017-08-15 DIAGNOSIS — E1142 Type 2 diabetes mellitus with diabetic polyneuropathy: Secondary | ICD-10-CM | POA: Diagnosis not present

## 2017-08-15 DIAGNOSIS — E1169 Type 2 diabetes mellitus with other specified complication: Secondary | ICD-10-CM

## 2017-08-15 MED ORDER — MELOXICAM 15 MG PO TABS: 15.0000 mg | ORAL_TABLET | Freq: Every day | ORAL | 1 refills | Status: DC

## 2017-08-15 NOTE — Progress Notes (Signed)
Subjective:  Patient ID: Sheri [name], female    DOB: 06/14/1956  Age: 61 y.o. MRN: [id number]  CC: The primary encounter diagnosis was Chronic midline low back pain without sciatica. Diagnoses of Class 2 severe obesity due to excess calories with serious comorbidity and body mass index (BMI) of 39.0 to 39.9 in adult [facility]), Type 2 DM with diabetic neuropathy affecting both sides of body (HCC), Hyperlipidemia associated with type 2 diabetes mellitus (HCC), Essential hypertension, benign, and Acute left-sided low back pain without sciatica were also pertinent to this visit.  HPI Sheri [name] presents for evaluation of multiple issues:   6 month follow up on diabetes.  Patient has no complaints today.  Patient is following a low glycemic index diet and taking all prescribed medications regularly without side effects.  Fasting sugars have been under less than 140 most of the time and post prandials have been under 160 except on rare occasions. Patient is not exercising at all or intentionally trying to lose weight .  Patient has had an eye exam in the last 12 months and checks feet regularly for signs of infection.  Patient does not walk barefoot outside,  And denies an numbness tingling or burning in feet. Patient is up to date on all recommended vaccinations  Hypertension: patient checks blood pressure twice weekly at home.  Readings have been for the most part  130/80 at rest . Patient is following a reduced salt diet most days and is taking medications as prescribed   She has been hAVING LOW Back pain for the past  month.  Non radiating, but has pain in the right anterior thigh as well.  Cannot left the leg without significant pain the back pain is aggravated by sitting, but also by prolonged standing.  No history of fall or prior fracture,  No history of malignancy. .    Outpatient Medications Prior to Visit  Medication Sig Dispense Refill  . ALPRAZolam (XANAX) 0.5 MG tablet TAKE ONE  TABLET BY MOUTH TWICE DAILY AS NEEDED FOR AXNITEY OR SLEEP 45 tablet 5  . amitriptyline (ELAVIL) 25 MG tablet Take 3 tablets (75 mg total) by mouth at bedtime. For neuropathy 270 tablet 1  . atorvastatin (LIPITOR) 20 MG tablet TAKE 1 TABLET BY MOUTH DAILY. 90 tablet 3  . Cholecalciferol 2000 units TABS Take 1 tablet by mouth daily.    . furosemide (LASIX) 20 MG tablet TAKE 1 TABLET BY MOUTH ONCE DAILY AS NEEDED FOR FLUID RETENTION 30 tablet 3  . glucose blood (ONE TOUCH TEST STRIPS) test strip Use as instructed     . lisinopril-hydrochlorothiazide (PRINZIDE,ZESTORETIC) 20-25 MG tablet TAKE 1 TABLET BY MOUTH DAILY 30 tablet 5  . omeprazole (PRILOSEC) 40 MG capsule Take 1 capsule (40 mg total) by mouth daily. 30 capsule 3  . meloxicam (MOBIC) 15 MG tablet Take 1 tablet (15 mg total) by mouth daily. 30 tablet 3  . ergocalciferol (DRISDOL) 50000 units capsule Take 1 capsule (50,000 Units total) by mouth once a week. (Patient not taking: Reported on 08/15/2017) 4 capsule 2  . potassium chloride SA (K-DUR,KLOR-CON) 20 MEQ tablet Take 1 tablet (20 mEq total) by mouth daily. (Patient not taking: Reported on 08/15/2017) 30 tablet 3  . scopolamine (TRANSDERM-SCOP, 1.5 MG,) 1 MG/3DAYS Place 1 patch (1.5 mg total) onto the skin every 3 (three) days. (Patient not taking: Reported on 08/15/2017) 10 patch 0  . zolpidem (AMBIEN CR) 12.5 MG CR tablet TAKE 1 TABLET BY MOUTH  ONCE DAILY AT BEDTIME AS NEEDED FOR SLEEP 30 tablet 5   No facility-administered medications prior to visit.     Review of Systems;  Patient denies headache, fevers, malaise, unintentional weight loss, skin rash, eye pain, sinus congestion and sinus pain, sore throat, dysphagia,  hemoptysis , cough, dyspnea, wheezing, chest pain, palpitations, orthopnea, edema, abdominal pain, nausea, melena, diarrhea, constipation, flank pain, dysuria, hematuria, urinary  Frequency, nocturia, numbness, tingling, seizures,  Focal weakness, Loss of consciousness,   Tremor, insomnia, depression, anxiety, and suicidal ideation.      Objective:  BP 118/76   Pulse 92   Temp 97.9 F (36.6 C) (Oral)   Resp 18   Ht 5\' 4"  (1.626 m)   Wt 221 lb 8 oz (100.5 kg)   SpO2 95%   BMI 38.02 kg/m   BP Readings from Last 3 Encounters:  08/15/17 118/76  02/11/17 120/80  11/12/16 118/84    Wt Readings from Last 3 Encounters:  08/15/17 221 lb 8 oz (100.5 kg)  02/11/17 216 lb 12.8 oz (98.3 kg)  11/12/16 218 lb (98.9 kg)    General appearance: alert, cooperative and appears stated age Ears: normal TM's and external ear canals both ears Throat: lips, mucosa, and tongue normal; teeth and gums normal Neck: no adenopathy, no carotid bruit, supple, symmetrical, trachea midline and thyroid not enlarged, symmetric, no tenderness/mass/nodules Back: symmetric, no curvature. ROM normal. No CVA tenderness. Lungs: clear to auscultation bilaterally Heart: regular rate and rhythm, S1, S2 normal, no murmur, click, rub or gallop Abdomen: soft, non-tender; bowel sounds normal; no masses,  no organomegaly Pulses: 2+ and symmetric Skin: Skin color, texture, turgor normal. No rashes or lesions Lymph nodes: Cervical, supraclavicular, and axillary nodes normal. MSK:  DTRS normal patellar and achilles.  Negative straight leg lift,  Normal hip ROM back pain reproduce with resisted flexion of left leg .  Lab Results  Component Value Date   HGBA1C 6.0 08/10/2017   HGBA1C 6.1 02/11/2017   HGBA1C 5.8 11/12/2016    Lab Results  Component Value Date   CREATININE 0.53 08/10/2017   CREATININE 0.62 02/11/2017   CREATININE 0.62 08/11/2016    Lab Results  Component Value Date   WBC 5.1 05/11/2013   HGB 13.7 05/11/2013   HCT 40.0 05/11/2013   PLT 301.0 05/11/2013   GLUCOSE 132 (H) 08/10/2017   CHOL 159 08/10/2017   TRIG 146.0 08/10/2017   HDL 40.70 08/10/2017   LDLDIRECT 97.0 08/11/2016   LDLCALC 89 08/10/2017   ALT 18 08/10/2017   AST 15 08/10/2017   NA 139  08/10/2017   K 4.0 08/10/2017   CL 108 08/10/2017   CREATININE 0.53 08/10/2017   BUN 20 08/10/2017   CO2 25 08/10/2017   TSH 1.02 05/11/2013   HGBA1C 6.0 08/10/2017   MICROALBUR 1.3 08/10/2017    Mm Screening Breast Tomo Bilateral  Result Date: 07/11/2017 CLINICAL DATA:  Screening. EXAM: 2D DIGITAL SCREENING BILATERAL MAMMOGRAM WITH CAD AND ADJUNCT TOMO COMPARISON:  Previous exam(s). ACR Breast Density Category c: The breast tissue is heterogeneously dense, which may obscure small masses. FINDINGS: There are no findings suspicious for malignancy. Images were processed with CAD. IMPRESSION: No mammographic evidence of malignancy. A result letter of this screening mammogram will be mailed directly to the patient. RECOMMENDATION: Screening mammogram in one year. (Code:SM-B-01Y) BI-RADS CATEGORY  1: Negative. Electronically Signed   By: Norva Pavlov M.D.   On: 07/11/2017 16:58    Assessment & Plan:   Problem List  Items Addressed This Visit    Essential hypertension, benign    Well controlled on current regimen. Renal function stable, no changes today.  Lab Results  Component Value Date   CREATININE 0.53 08/10/2017   Lab Results  Component Value Date   NA 139 08/10/2017   K 4.0 08/10/2017   CL 108 08/10/2017   CO2 25 08/10/2017         Hyperlipidemia associated with type 2 diabetes mellitus (HCC)    LDL and triglycerides are at goal on atorvastatin 20 mg daily . She has no side effects and liver enzymes are normal. No changes today  Lab Results  Component Value Date   CHOL 159 08/10/2017   HDL 40.70 08/10/2017   LDLCALC 89 08/10/2017   LDLDIRECT 97.0 08/11/2016   TRIG 146.0 08/10/2017   CHOLHDL 4 08/10/2017    Lab Results  Component Value Date   ALT 18 08/10/2017   AST 15 08/10/2017   ALKPHOS 71 08/10/2017   BILITOT 0.4 08/10/2017              Lumbago syndrome - Primary    Meloxicam. Tylenol.  Weight loss    If no improvement in 3 weeks,  Plain films  and PT/sports medicine referral.       Relevant Medications   meloxicam (MOBIC) 15 MG tablet   Other Relevant Orders   DG Lumbar Spine Complete (Completed)   Obesity    With deconditioning and new onset low back pain . I have addressed  BMI and recommended wt loss of 10% of body weigh over the next 6 months using a low glycemic index diet and regular exercise a minimum of 5 days per week with attention to core strengthening,  PT referral offered. .        Type 2 DM with diabetic neuropathy affecting both sides of body (HCC)    Still well-controlled on diet alone .  hemoglobin A1c has been consistently at or  less than 7.0 . Patient is is already on on ACE/ARB for renal protection and hypertension  And is up to date on eye exams.   Lab Results  Component Value Date   HGBA1C 6.0 08/10/2017   Lab Results  Component Value Date   MICROALBUR 1.3 08/10/2017    Remains well-controlled on diet alone .  hemoglobin A1c has been consistently at or  less than 6.0 . Patient is is already on on ACE/ARB for renal protection and hypertension .  Reminder for diabetic eye exam given.  Continue elavil for neuropathy Lab Results  Component Value Date   HGBA1C 6.0 08/10/2017   Lab Results  Component Value Date   MICROALBUR 1.3 08/10/2017                        I am having Jenifer A. Macera maintain her glucose blood, potassium chloride SA, furosemide, omeprazole, Cholecalciferol, amitriptyline, scopolamine, ergocalciferol, ALPRAZolam, atorvastatin, zolpidem, lisinopril-hydrochlorothiazide, and meloxicam.  Meds ordered this encounter  Medications  . meloxicam (MOBIC) 15 MG tablet    Sig: Take 1 tablet (15 mg total) by mouth daily.    Dispense:  90 tablet    Refill:  1    Medications Discontinued During This Encounter  Medication Reason  . meloxicam (MOBIC) 15 MG tablet Reorder    Follow-up: No Follow-up on file.   [name], MD

## 2017-08-15 NOTE — Patient Instructions (Signed)
Ok to use meloxicam 15 mg daily AND tylenol,  Up to 2000  Mg daily in divided doses  If no improvement in 3 weeks,  Sports medicine referral    Back Pain, Adult Back pain is very common. The pain often gets better over time. The cause of back pain is usually not dangerous. Most people can learn to manage their back pain on their own. Follow these instructions at home: Watch your back pain for any changes. The following actions may help to lessen any pain you are feeling:  Stay active. Start with short walks on flat ground if you can. Try to walk farther each day.  Exercise regularly as told by your doctor. Exercise helps your back heal faster. It also helps avoid future injury by keeping your muscles strong and flexible.  Do not sit, drive, or stand in one place for more than 30 minutes.  Do not stay in bed. Resting more than 1-2 days can slow down your recovery.  Be careful when you bend or lift an object. Use good form when lifting: ? Bend at your knees. ? Keep the object close to your body. ? Do not twist.  Sleep on a firm mattress. Lie on your side, and bend your knees. If you lie on your back, put a pillow under your knees.  Take medicines only as told by your doctor.  Put ice on the injured area. ? Put ice in a plastic bag. ? Place a towel between your skin and the bag. ? Leave the ice on for 20 minutes, 2-3 times a day for the first 2-3 days. After that, you can switch between ice and heat packs.  Avoid feeling anxious or stressed. Find good ways to deal with stress, such as exercise.  Maintain a healthy weight. Extra weight puts stress on your back.  Contact a doctor if:  You have pain that does not go away with rest or medicine.  You have worsening pain that goes down into your legs or buttocks.  You have pain that does not get better in one week.  You have pain at night.  You lose weight.  You have a fever or chills. Get help right away if:  You cannot  control when you poop (bowel movement) or pee (urinate).  Your arms or legs feel weak.  Your arms or legs lose feeling (numbness).  You feel sick to your stomach (nauseous) or throw up (vomit).  You have belly (abdominal) pain.  You feel like you may pass out (faint). This information is not intended to replace advice given to you by your health care provider. Make sure you discuss any questions you have with your health care provider. Document Released: 02/23/2008 Document Revised: 02/12/2016 Document Reviewed: 01/08/2014 Elsevier Interactive Patient Education  [employer].

## 2017-08-15 NOTE — Progress Notes (Signed)
Pre-visit discussion using our clinic review tool. No additional management support is needed unless otherwise documented below in the visit note.  

## 2017-08-16 DIAGNOSIS — M545 Low back pain, unspecified: Secondary | ICD-10-CM | POA: Insufficient documentation

## 2017-08-16 NOTE — Assessment & Plan Note (Signed)
Meloxicam. Tylenol.  Weight loss    If no improvement in 3 weeks,  Plain films and PT/sports medicine referral.

## 2017-08-16 NOTE — Assessment & Plan Note (Signed)
With deconditioning and new onset low back pain . I have addressed  BMI and recommended wt loss of 10% of body weigh over the next 6 months using a low glycemic index diet and regular exercise a minimum of 5 days per week with attention to core strengthening,  PT referral offered. Marland Kitchen

## 2017-08-16 NOTE — Assessment & Plan Note (Signed)
Still well-controlled on diet alone .  hemoglobin A1c has been consistently at or  less than 7.0 . Patient is is already on on ACE/ARB for renal protection and hypertension  And is up to date on eye exams.   Lab Results  Component Value Date   HGBA1C 6.0 08/10/2017   Lab Results  Component Value Date   MICROALBUR 1.3 08/10/2017    Remains well-controlled on diet alone .  hemoglobin A1c has been consistently at or  less than 6.0 . Patient is is already on on ACE/ARB for renal protection and hypertension .  Reminder for diabetic eye exam given.  Continue elavil for neuropathy Lab Results  Component Value Date   HGBA1C 6.0 08/10/2017   Lab Results  Component Value Date   MICROALBUR 1.3 08/10/2017

## 2017-08-16 NOTE — Assessment & Plan Note (Signed)
Well controlled on current regimen. Renal function stable, no changes today.  Lab Results  Component Value Date   CREATININE 0.53 08/10/2017   Lab Results  Component Value Date   NA 139 08/10/2017   K 4.0 08/10/2017   CL 108 08/10/2017   CO2 25 08/10/2017

## 2017-08-16 NOTE — Assessment & Plan Note (Signed)
LDL and triglycerides are at goal on atorvastatin 20 mg daily . She has no side effects and liver enzymes are normal. No changes today  Lab Results  Component Value Date   CHOL 159 08/10/2017   HDL 40.70 08/10/2017   LDLCALC 89 08/10/2017   LDLDIRECT 97.0 08/11/2016   TRIG 146.0 08/10/2017   CHOLHDL 4 08/10/2017    Lab Results  Component Value Date   ALT 18 08/10/2017   AST 15 08/10/2017   ALKPHOS 71 08/10/2017   BILITOT 0.4 08/10/2017

## 2017-09-21 ENCOUNTER — Encounter: Payer: Self-pay | Admitting: Nurse Practitioner

## 2017-09-21 ENCOUNTER — Ambulatory Visit: Payer: Self-pay | Admitting: Internal Medicine

## 2017-09-21 ENCOUNTER — Ambulatory Visit (INDEPENDENT_AMBULATORY_CARE_PROVIDER_SITE_OTHER): Payer: Self-pay | Admitting: Nurse Practitioner

## 2017-09-21 VITALS — BP 126/93 | HR 108 | Temp 98.0°F | Wt 227.0 lb

## 2017-09-21 DIAGNOSIS — J209 Acute bronchitis, unspecified: Secondary | ICD-10-CM

## 2017-09-21 DIAGNOSIS — J069 Acute upper respiratory infection, unspecified: Secondary | ICD-10-CM

## 2017-09-21 MED ORDER — AZITHROMYCIN 250 MG PO TABS: ORAL_TABLET | ORAL | 0 refills | Status: AC

## 2017-09-21 MED ORDER — PREDNISONE 10 MG (21) PO TBPK: ORAL_TABLET | ORAL | 0 refills | Status: AC

## 2017-09-21 MED ORDER — DEXTROMETHORPHAN HBR 15 MG/5ML PO SYRP: 10.0000 mL | ORAL_SOLUTION | Freq: Four times a day (QID) | ORAL | 0 refills | Status: AC | PRN

## 2017-09-21 NOTE — Progress Notes (Signed)
Subjective:  Sheri [name] is a 62 y.o. female who presents for evaluation of URI like symptoms.  Symptoms include congestion, chills, fever: suspected fevers but not measured at home, headache described as dull, non productive cough, post nasal drip and sore throat.  Onset of symptoms was 5 days ago, and has been gradually worsening since that time.  Treatment to date:  decongestants.  High risk factors for influenza complications:  none.  The following portions of the patient's history were reviewed and updated as appropriate:  allergies, current medications and past medical history.  Constitutional: positive for chills, fatigue, fevers and sweats, negative for anorexia Eyes: negative Ears, nose, mouth, throat, and face: positive for hoarseness, nasal congestion and sore throat, negative for ear drainage and earaches Respiratory: positive for cough Cardiovascular: negative Neurological: positive for headaches Behavioral/Psych: negative Allergic/Immunologic: negative Objective:  BP (!) 126/93   Pulse (!) 108   Temp 98 F (36.7 C)   Wt 227 lb (103 kg)   SpO2 96%   BMI 38.96 kg/m  General appearance: alert, cooperative and fatigued Head: Normocephalic, without obvious abnormality, atraumatic Eyes: conjunctivae/corneas clear. PERRL, EOM's intact. Fundi benign. Ears: normal TM's and external ear canals both ears Nose: no discharge, turbinates swollen, edematous, no sinus tenderness Throat: lips, mucosa, and tongue normal; teeth and gums normal Lungs: clear to auscultation bilaterally Heart: regular rate and rhythm, S1, S2 normal, no murmur, click, rub or gallop Abdomen: soft, non-tender; bowel sounds normal; no masses,  no organomegaly Pulses: 2+ and symmetric Skin: Skin color, texture, turgor normal. No rashes or lesions Lymph nodes: cervical adenopathy present, submandibular nodes normal Neurologic: Grossly normal    Assessment:  Acute Upper Respiratory Infection    Plan:   Discussed diagnosis and treatment of URI. Suggested symptomatic OTC remedies. Supportive care with appropriate antipyretics and fluids. Nasal saline spray for congestion. Zithromax per orders. Follow up as needed. Call in 4 days if symptoms aren't resolving. Patient also given Sterapred dose pack with instructions and Tussin cough syrup.

## 2017-09-23 ENCOUNTER — Telehealth: Payer: Self-pay

## 2017-09-23 NOTE — Telephone Encounter (Signed)
Called to follow up with patient to see how she was feeling and pt states she is feeling better.

## 2017-09-27 ENCOUNTER — Other Ambulatory Visit: Payer: Self-pay | Admitting: Internal Medicine

## 2017-10-21 ENCOUNTER — Other Ambulatory Visit: Payer: Self-pay | Admitting: Internal Medicine

## 2017-10-21 NOTE — Telephone Encounter (Signed)
Last refill 09/01/17 Last office visit 08/15/17 Next 02/15/18

## 2017-11-11 DIAGNOSIS — D225 Melanocytic nevi of trunk: Secondary | ICD-10-CM | POA: Diagnosis not present

## 2017-11-11 DIAGNOSIS — D2272 Melanocytic nevi of left lower limb, including hip: Secondary | ICD-10-CM | POA: Diagnosis not present

## 2017-11-11 DIAGNOSIS — D2261 Melanocytic nevi of right upper limb, including shoulder: Secondary | ICD-10-CM | POA: Diagnosis not present

## 2017-11-11 DIAGNOSIS — L820 Inflamed seborrheic keratosis: Secondary | ICD-10-CM | POA: Diagnosis not present

## 2017-11-11 DIAGNOSIS — D2271 Melanocytic nevi of right lower limb, including hip: Secondary | ICD-10-CM | POA: Diagnosis not present

## 2018-01-23 ENCOUNTER — Other Ambulatory Visit: Payer: Self-pay | Admitting: Internal Medicine

## 2018-02-15 ENCOUNTER — Encounter: Payer: Self-pay | Admitting: Internal Medicine

## 2018-02-15 ENCOUNTER — Ambulatory Visit: Payer: 59 | Admitting: Internal Medicine

## 2018-02-15 VITALS — BP 112/74 | HR 112 | Temp 98.1°F | Resp 15 | Ht 64.0 in | Wt 231.4 lb

## 2018-02-15 DIAGNOSIS — Z6839 Body mass index (BMI) 39.0-39.9, adult: Secondary | ICD-10-CM

## 2018-02-15 DIAGNOSIS — E66812 Obesity, class 2: Secondary | ICD-10-CM

## 2018-02-15 DIAGNOSIS — R05 Cough: Secondary | ICD-10-CM

## 2018-02-15 DIAGNOSIS — R059 Cough, unspecified: Secondary | ICD-10-CM

## 2018-02-15 DIAGNOSIS — E1142 Type 2 diabetes mellitus with diabetic polyneuropathy: Secondary | ICD-10-CM | POA: Diagnosis not present

## 2018-02-15 DIAGNOSIS — F5104 Psychophysiologic insomnia: Secondary | ICD-10-CM

## 2018-02-15 DIAGNOSIS — E785 Hyperlipidemia, unspecified: Secondary | ICD-10-CM

## 2018-02-15 DIAGNOSIS — E1169 Type 2 diabetes mellitus with other specified complication: Secondary | ICD-10-CM

## 2018-02-15 LAB — COMPREHENSIVE METABOLIC PANEL
ALT: 34 U/L (ref 0–35)
AST: 25 U/L (ref 0–37)
Albumin: 4.5 g/dL (ref 3.5–5.2)
Alkaline Phosphatase: 65 U/L (ref 39–117)
BUN: 21 mg/dL (ref 6–23)
CALCIUM: 9.6 mg/dL (ref 8.4–10.5)
CHLORIDE: 103 meq/L (ref 96–112)
CO2: 23 mEq/L (ref 19–32)
Creatinine, Ser: 0.67 mg/dL (ref 0.40–1.20)
GFR: 94.83 mL/min (ref >60.00)
Glucose, Bld: 160 mg/dL — ABNORMAL HIGH (ref 70–99)
POTASSIUM: 3.7 meq/L (ref 3.5–5.1)
SODIUM: 137 meq/L (ref 135–145)
Total Bilirubin: 0.8 mg/dL (ref 0.2–1.2)
Total Protein: 7.7 g/dL (ref 6.0–8.3)

## 2018-02-15 LAB — LIPID PANEL
Cholesterol: 163 mg/dL (ref 0–200)
HDL: 38.5 mg/dL — AB (ref >39.00)
LDL CALC: 87 mg/dL (ref 0–99)
NonHDL: 124.13
TRIGLYCERIDES: 185 mg/dL — AB (ref 0.0–149.0)
Total CHOL/HDL Ratio: 4
VLDL: 37 mg/dL (ref 0.0–40.0)

## 2018-02-15 LAB — HEMOGLOBIN A1C: Hgb A1c MFr Bld: 6.6 % — ABNORMAL HIGH (ref 4.6–6.5)

## 2018-02-15 MED ORDER — AMITRIPTYLINE HCL 25 MG PO TABS: 75.0000 mg | ORAL_TABLET | Freq: Every day | ORAL | 1 refills | Status: DC

## 2018-02-15 MED ORDER — BENZONATATE 200 MG PO CAPS: 200.0000 mg | ORAL_CAPSULE | Freq: Two times a day (BID) | ORAL | 0 refills | Status: DC | PRN

## 2018-02-15 MED ORDER — TEMAZEPAM 7.5 MG PO CAPS: 7.5000 mg | ORAL_CAPSULE | Freq: Every evening | ORAL | 0 refills | Status: DC | PRN

## 2018-02-15 MED ORDER — FUROSEMIDE 20 MG PO TABS: ORAL_TABLET | ORAL | 3 refills | Status: DC
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
## 2018-02-15 NOTE — Patient Instructions (Addendum)
Try benadryl  One hour before bedtime for the PND.  Stop the ambien since it is causing midnight eating binges  Use restoril  Instead,  Start with 7.5 mg at night,  You can increase to 15 mg daily   If the cough is not better in one week , call for prednisone taper     Daily use of Probiotics for  3 weeks IS ALWAYS ADVISED WHEN YOU ARE TAKING AN ANTIBIOTIC  to reduce risk of C dificile colitis.

## 2018-02-17 DIAGNOSIS — R059 Cough, unspecified: Secondary | ICD-10-CM | POA: Insufficient documentation

## 2018-02-17 DIAGNOSIS — R05 Cough: Secondary | ICD-10-CM | POA: Insufficient documentation

## 2018-02-17 NOTE — Assessment & Plan Note (Signed)
LDL and triglycerides are at goal on atorvastatin 20 mg daily . Triglycerides are elevated due to her stress eatig. She has no side effects and liver enzymes are normal. No changes today  Lab Results  Component Value Date   CHOL 163 02/15/2018   HDL 38.50 (L) 02/15/2018   LDLCALC 87 02/15/2018   LDLDIRECT 97.0 08/11/2016   TRIG 185.0 (H) 02/15/2018   CHOLHDL 4 02/15/2018    Lab Results  Component Value Date   ALT 34 02/15/2018   AST 25 02/15/2018   ALKPHOS 65 02/15/2018   BILITOT 0.8 02/15/2018
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
## 2018-02-17 NOTE — Assessment & Plan Note (Signed)
With deconditioning and now low back pain . I have addressed  BMI and commended her for taking the initiative to call the [facility] management grop.  She has an appt with Dr Leafy Ro In July.  Continued to encourage wt loss of 10% of body weigh over the next 6 months using a low glycemic index diet and regular exercise a minimum of 5 days per week with attention to core strengthening

## 2018-02-17 NOTE — Assessment & Plan Note (Signed)
Persistent sequelae from recent URI.  HEENT and lung exam is normal.  Trial of benadryl  for PND   Will add prednisone taper if no improve,ement in 3-4 days

## 2018-02-17 NOTE — Assessment & Plan Note (Signed)
Still well-controlled on diet alone .  hemoglobin A1c has been consistently at or  less than 7.0 . Patient is is already on on ACE/ARB for renal protection and hypertension  And is up to date on eye exams.   Lab Results  Component Value Date   HGBA1C 6.6 (H) 02/15/2018   Lab Results  Component Value Date   MICROALBUR 1.3 08/10/2017    Remains well-controlled on diet alone .  hemoglobin A1c has been consistently at or  less than 6.0 . Patient is is already on on ACE/ARB for renal protection and hypertension .  Reminder for diabetic eye exam given.  Continue elavil for neuropathy Lab Results  Component Value Date   HGBA1C 6.6 (H) 02/15/2018   Lab Results  Component Value Date   MICROALBUR 1.3 08/10/2017

## 2018-02-20 ENCOUNTER — Ambulatory Visit (INDEPENDENT_AMBULATORY_CARE_PROVIDER_SITE_OTHER): Payer: 59 | Admitting: Internal Medicine

## 2018-02-20 ENCOUNTER — Encounter: Payer: Self-pay | Admitting: Internal Medicine

## 2018-02-20 VITALS — BP 120/86 | HR 86 | Temp 98.1°F | Resp 14 | Ht 64.0 in | Wt 236.6 lb

## 2018-02-20 DIAGNOSIS — Z0000 Encounter for general adult medical examination without abnormal findings: Secondary | ICD-10-CM | POA: Diagnosis not present

## 2018-02-20 DIAGNOSIS — Z1211 Encounter for screening for malignant neoplasm of colon: Secondary | ICD-10-CM

## 2018-02-20 MED ORDER — ERGOCALCIFEROL 1.25 MG (50000 UT) PO CAPS: 50000.0000 [IU] | ORAL_CAPSULE | ORAL | 0 refills | Status: DC

## 2018-02-20 NOTE — Assessment & Plan Note (Signed)
Annual comprehensive preventive exam was done as well as an evaluation and management of chronic conditions .  During the course of the visit the patient was educated and counseled about appropriate screening and preventive services including :  diabetes screening, lipid analysis with projected  10 year  risk for CAD , nutrition counseling, breast, cervical and colorectal cancer screening, and recommended immunizations.  Printed recommendations for health maintenance screenings was given 

## 2018-02-20 NOTE — Patient Instructions (Addendum)
You are due for mammogram in October  You are due for colonoscopy in November   PAP is  due in 2021  DEXA when you turn 65   I recommend getting the majority of your calcium and Vitamin D  through diet rather than supplements given the recent association of calcium supplements with increased coronary artery calcium scores  Unsweetened almond/coconut milk  And soy milk,   Cottage cheeses, yogurt,  Leafy green vegetables are all  low calorie low carb ways to increase your dietary calcium and vitamin D.  Try the blue Jackquline Bosch  One you finish the megadose of  D,  Start an OTC supplement for D3  At a dose of 1000 to 2000 IUs daily     The ShingRx vaccine is now available to prevetn shingles.and is much more protective than the old vaccine called Zostavaxs,  It is therefore ADVISED for all interested adults over 50 to prevent shingles   Health Maintenance for Postmenopausal Women Menopause is a normal process in which your reproductive ability comes to an end. This process happens gradually over a span of months to years, usually between the ages of 62 and 23. Menopause is complete when you have missed 12 consecutive menstrual periods. It is important to talk with your health care provider about some of the most common conditions that affect postmenopausal women, such as heart disease, cancer, and bone loss (osteoporosis). Adopting a healthy lifestyle and getting preventive care can help to promote your health and wellness. Those actions can also lower your chances of developing some of these common conditions. What should I know about menopause? During menopause, you may experience a number of symptoms, such as:  Moderate-to-severe hot flashes.  Night sweats.  Decrease in sex drive.  Mood swings.  Headaches.  Tiredness.  Irritability.  Memory problems.  Insomnia.  Choosing to treat or not to treat menopausal changes is an individual decision that you make with your health  care provider. What should I know about hormone replacement therapy and supplements? Hormone therapy products are effective for treating symptoms that are associated with menopause, such as hot flashes and night sweats. Hormone replacement carries certain risks, especially as you become older. If you are thinking about using estrogen or estrogen with progestin treatments, discuss the benefits and risks with your health care provider. What should I know about heart disease and stroke? Heart disease, heart attack, and stroke become more likely as you age. This may be due, in part, to the hormonal changes that your body experiences during menopause. These can affect how your body processes dietary fats, triglycerides, and cholesterol. Heart attack and stroke are both medical emergencies. There are many things that you can do to help prevent heart disease and stroke:  Have your blood pressure checked at least every 1-2 years. High blood pressure causes heart disease and increases the risk of stroke.  If you are 35-37 years old, ask your health care provider if you should take aspirin to prevent a heart attack or a stroke.  Do not use any tobacco products, including cigarettes, chewing tobacco, or electronic cigarettes. If you need help quitting, ask your health care provider.  It is important to eat a healthy diet and maintain a healthy weight. ? Be sure to include plenty of vegetables, fruits, low-fat dairy products, and lean protein. ? Avoid eating foods that are high in solid fats, added sugars, or salt (sodium).  Get regular exercise. This is one of the  most important things that you can do for your health. ? Try to exercise for at least 150 minutes each week. The type of exercise that you do should increase your heart rate and make you sweat. This is known as moderate-intensity exercise. ? Try to do strengthening exercises at least twice each week. Do these in addition to the moderate-intensity  exercise.  Know your numbers.Ask your health care provider to check your cholesterol and your blood glucose. Continue to have your blood tested as directed by your health care provider.  What should I know about cancer screening? There are several types of cancer. Take the following steps to reduce your risk and to catch any cancer development as early as possible. Breast Cancer  Practice breast self-awareness. ? This means understanding how your breasts normally appear and feel. ? It also means doing regular breast self-exams. Let your health care provider know about any changes, no matter how small.  If you are 62 or older, have a clinician do a breast exam (clinical breast exam or CBE) every year. Depending on your age, family history, and medical history, it may be recommended that you also have a yearly breast X-ray (mammogram).  If you have a family history of breast cancer, talk with your health care provider about genetic screening.  If you are at high risk for breast cancer, talk with your health care provider about having an MRI and a mammogram every year.  Breast cancer (BRCA) gene test is recommended for women who have family members with BRCA-related cancers. Results of the assessment will determine the need for genetic counseling and BRCA1 and for BRCA2 testing. BRCA-related cancers include these types: ? Breast. This occurs in males or females. ? Ovarian. ? Tubal. This may also be called fallopian tube cancer. ? Cancer of the abdominal or pelvic lining (peritoneal cancer). ? Prostate. ? Pancreatic.  Cervical, Uterine, and Ovarian Cancer Your health care provider may recommend that you be screened regularly for cancer of the pelvic organs. These include your ovaries, uterus, and vagina. This screening involves a pelvic exam, which includes checking for microscopic changes to the surface of your cervix (Pap test).  For women ages 62-65 health care providers may recommend a  pelvic exam and a Pap test every three years. For women ages 62-65 they may recommend the Pap test and pelvic exam, combined with testing for human papilloma virus (HPV), every five years. Some types of HPV increase your risk of cervical cancer. Testing for HPV may also be done on women of any age who have unclear Pap test results.  Other health care providers may not recommend any screening for nonpregnant women who are considered low risk for pelvic cancer and have no symptoms. Ask your health care provider if a screening pelvic exam is right for you.  If you have had past treatment for cervical cancer or a condition that could lead to cancer, you need Pap tests and screening for cancer for at least 20 years after your treatment. If Pap tests have been discontinued for you, your risk factors (such as having a new sexual partner) need to be reassessed to determine if you should start having screenings again. Some women have medical problems that increase the chance of getting cervical cancer. In these cases, your health care provider may recommend that you have screening and Pap tests more often.  If you have a family history of uterine cancer or ovarian cancer, talk with your health care provider about  genetic screening.  If you have vaginal bleeding after reaching menopause, tell your health care provider.  There are currently no reliable tests available to screen for ovarian cancer.  Lung Cancer Lung cancer screening is recommended for adults 40-42 years old who are at high risk for lung cancer because of a history of smoking. A yearly low-dose CT scan of the lungs is recommended if you:  Currently smoke.  Have a history of at least 30 pack-years of smoking and you currently smoke or have quit within the past 15 years. A pack-year is smoking an average of one pack of cigarettes per day for one year.  Yearly screening should:  Continue until it has been 15 years since you quit.  Stop if  you develop a health problem that would prevent you from having lung cancer treatment.  Colorectal Cancer  This type of cancer can be detected and can often be prevented.  Routine colorectal cancer screening usually begins at age 86 and continues through age 81.  If you have risk factors for colon cancer, your health care provider may recommend that you be screened at an earlier age.  If you have a family history of colorectal cancer, talk with your health care provider about genetic screening.  Your health care provider may also recommend using home test kits to check for hidden blood in your stool.  A small camera at the end of a tube can be used to examine your colon directly (sigmoidoscopy or colonoscopy). This is done to check for the earliest forms of colorectal cancer.  Direct examination of the colon should be repeated every 5-10 years until age 28. However, if early forms of precancerous polyps or small growths are found or if you have a family history or genetic risk for colorectal cancer, you may need to be screened more often.  Skin Cancer  Check your skin from head to toe regularly.  Monitor any moles. Be sure to tell your health care provider: ? About any new moles or changes in moles, especially if there is a change in a mole's shape or color. ? If you have a mole that is larger than the size of a pencil eraser.  If any of your family members has a history of skin cancer, especially at a young age, talk with your health care provider about genetic screening.  Always use sunscreen. Apply sunscreen liberally and repeatedly throughout the day.  Whenever you are outside, protect yourself by wearing long sleeves, pants, a wide-brimmed hat, and sunglasses.  What should I know about osteoporosis? Osteoporosis is a condition in which bone destruction happens more quickly than new bone creation. After menopause, you may be at an increased risk for osteoporosis. To help prevent  osteoporosis or the bone fractures that can happen because of osteoporosis, the following is recommended:  If you are 64-19 years old, get at least 1,000 mg of calcium and at least 600 mg of vitamin D per day.  If you are older than age 35 but younger than age 24, get at least 1,200 mg of calcium and at least 600 mg of vitamin D per day.  If you are older than age 65, get at least 1,200 mg of calcium and at least 800 mg of vitamin D per day.  Smoking and excessive alcohol intake increase the risk of osteoporosis. Eat foods that are rich in calcium and vitamin D, and do weight-bearing exercises several times each week as directed by your health care  provider. What should I know about how menopause affects my mental health? Depression may occur at any age, but it is more common as you become older. Common symptoms of depression include:  Low or sad mood.  Changes in sleep patterns.  Changes in appetite or eating patterns.  Feeling an overall lack of motivation or enjoyment of activities that you previously enjoyed.  Frequent crying spells.  Talk with your health care provider if you think that you are experiencing depression. What should I know about immunizations? It is important that you get and maintain your immunizations. These include:  Tetanus, diphtheria, and pertussis (Tdap) booster vaccine.  Influenza every year before the flu season begins.  Pneumonia vaccine.  Shingles vaccine.  Your health care provider may also recommend other immunizations. This information is not intended to replace advice given to you by your health care provider. Make sure you discuss any questions you have with your health care provider. Document Released: 10/29/2005 Document Revised: 03/26/2016 Document Reviewed: 06/10/2015 Elsevier Interactive Patient Education  2018 [employer].

## 2018-02-20 NOTE — Progress Notes (Signed)
Patient ID: Sheri [name], female    DOB: 06-16-1956  Age: 62 y.o. MRN: [id number]  The patient is here for annual preventive  examination   The risk factors are reflected in the social history.  The roster of all physicians providing medical care to patient - is listed in the Snapshot section of the chart.  Activities of daily living:  The patient is 100% independent in all ADLs: dressing, toileting, feeding as well as independent mobility  Home safety : The patient [name] smoke detectors in the home. They wear seatbelts.  There are no firearms at home. There is no violence in the home.   There is no risks for hepatitis, STDs or HIV. There is no   history of blood transfusion. They have no travel history to infectious disease endemic areas of the world.  The patient [name] seen their dentist in the last six month. They have seen their eye doctor in the last year. They deny hearing difficulty with regard to whispered voices and some television programs.  They have deferred audiologic testing in the last year.  They do not  have excessive sun exposure. Discussed the need for sun protection: hats, long sleeves and use of sunscreen if there is significant sun exposure.   Diet: the importance of a healthy diet is discussed. They do have a healthy diet.  The benefits of regular aerobic exercise were discussed. She is not exercising  And [name] gained weight.   Depression screen: there are no signs or vegative symptoms of depression- irritability, change in appetite, anhedonia, sadness/tearfullness.  Cognitive assessment: the patient manages all their financial and personal affairs and is actively engaged. They could relate day,date,year and events; recalled 2/3 objects at 3 minutes; performed clock-face test normally.  The following portions of the patient's history were reviewed and updated as appropriate: allergies, current medications, past family history, past medical history,  past surgical history,  past social history  and problem list.  Visual acuity was not assessed per patient preference since [name] regular follow up with her ophthalmologist. Hearing and body mass index were assessed and reviewed.   During the course of the visit the patient was educated and counseled about appropriate screening and preventive services including : fall prevention , diabetes screening, nutrition counseling, colorectal cancer screening, and recommended immunizations.    CC: The primary encounter diagnosis was Colon cancer screening. A diagnosis of Encounter for preventive health examination was also pertinent to this visit.  History Sheri [name] a past medical history of Abdominal pain, epigastric, Diabetes mellitus, type 2 (HCC), Diverticulosis of colon without hemorrhage, Duodenitis with hemorrhage, Edema, Heart murmur, Hyperlipidemia, Hypertension, Mitral regurgitation, Obesity, unspecified, Osteopenia, Panic disorder without agoraphobia, Personal history of malignant neoplasm of cervix uteri, Personal history of other genital system and obstetric disorders(V13.29), Rash and other nonspecific skin eruption, Reflux esophagitis, Unspecified hereditary and idiopathic peripheral neuropathy, and Unspecified sleep apnea.   [name] a past surgical history that includes Right oophorectomy (2001) and Breast cyst aspiration.   Her family history includes Alzheimer's disease in her father; Coronary artery disease in her father; Diabetes in her father; Hypothyroidism in her mother; Osteopenia in her mother.She reports that [name] never smoked. [name] never used smokeless tobacco. She reports that she drinks alcohol. She reports that she does not use drugs.  Outpatient Medications Prior to Visit  Medication Sig Dispense Refill  . ALPRAZolam (XANAX) 0.5 MG tablet TAKE ONE TABLET BY MOUTH TWICE DAILY AS NEEDED FOR AXNITEY  OR SLEEP 45 tablet 5  . amitriptyline (ELAVIL) 25 MG tablet Take 3 tablets (75 mg total) by mouth at  bedtime. For neuropathy 270 tablet 1  . atorvastatin (LIPITOR) 20 MG tablet TAKE 1 TABLET BY MOUTH DAILY. 90 tablet 3  . benzonatate (TESSALON) 200 MG capsule Take 1 capsule (200 mg total) by mouth 2 (two) times daily as needed for cough. 20 capsule 0  . furosemide (LASIX) 20 MG tablet TAKE 1 TABLET BY MOUTH ONCE DAILY AS NEEDED FOR FLUID RETENTION 30 tablet 3  . glucose blood (ONE TOUCH TEST STRIPS) test strip Use as instructed     . lisinopril-hydrochlorothiazide (PRINZIDE,ZESTORETIC) 20-25 MG tablet TAKE 1 TABLET BY MOUTH DAILY 90 tablet 1  . meloxicam (MOBIC) 15 MG tablet Take 1 tablet (15 mg total) by mouth daily. 90 tablet 1  . omeprazole (PRILOSEC) 40 MG capsule TAKE 1 CAPSULE (40 MG TOTAL) BY MOUTH DAILY. 30 capsule 3  . temazepam (RESTORIL) 7.5 MG capsule Take 1 capsule (7.5 mg total) by mouth at bedtime as needed for sleep. 90 capsule 0   No facility-administered medications prior to visit.     Review of Systems   Patient denies headache, fevers, malaise, unintentional weight loss, skin rash, eye pain, sinus congestion and sinus pain, sore throat, dysphagia,  hemoptysis , cough, dyspnea, wheezing, chest pain, palpitations, orthopnea, edema, abdominal pain, nausea, melena, diarrhea, constipation, flank pain, dysuria, hematuria, urinary  Frequency, nocturia, numbness, tingling, seizures,  Focal weakness, Loss of consciousness,  Tremor, insomnia, depression, anxiety, and suicidal ideation.      Objective:  BP 120/86 (BP Location: Left Arm, Patient Position: Sitting, Cuff Size: Large)   Pulse 86   Temp 98.1 F (36.7 C) (Oral)   Resp 14   Ht 5\' 4"  (1.626 m)   Wt 236 lb 9.6 oz (107.3 kg)   SpO2 97%   BMI 40.61 kg/m   Physical Exam   General appearance: alert, cooperative and appears stated age Head: Normocephalic, without obvious abnormality, atraumatic Eyes: conjunctivae/corneas clear. PERRL, EOM's intact. Fundi benign. Ears: normal TM's and external ear canals both  ears Nose: Nares normal. Septum midline. Mucosa normal. No drainage or sinus tenderness. Throat: lips, mucosa, and tongue normal; teeth and gums normal Neck: no adenopathy, no carotid bruit, no JVD, supple, symmetrical, trachea midline and thyroid not enlarged, symmetric, no tenderness/mass/nodules Lungs: clear to auscultation bilaterally Breasts: normal appearance, no masses or tenderness Heart: regular rate and rhythm, S1, S2 normal, no murmur, click, rub or gallop Abdomen: soft, non-tender; bowel sounds normal; no masses,  no organomegaly Extremities: extremities normal, atraumatic, no cyanosis or edema Pulses: 2+ and symmetric Skin: Skin color, texture, turgor normal. No rashes or lesions Neurologic: Alert and oriented X 3, normal strength and tone. Normal symmetric reflexes. Normal coordination and gait.    Assessment & Plan:   Problem List Items Addressed This Visit    Encounter for preventive health examination    Annual comprehensive preventive exam was done as well as an evaluation and management of chronic conditions .  During the course of the visit the patient was educated and counseled about appropriate screening and preventive services including :  diabetes screening, lipid analysis with projected  10 year  risk for CAD , nutrition counseling, breast, cervical and colorectal cancer screening, and recommended immunizations.  Printed recommendations for health maintenance screenings was given       Other Visit Diagnoses    Colon cancer screening    -  Primary  Relevant Orders   Ambulatory referral to Gastroenterology      I am having Gianina A. Rettke start on ergocalciferol. I am also having her maintain her glucose blood, atorvastatin, meloxicam, omeprazole, ALPRAZolam, lisinopril-hydrochlorothiazide, amitriptyline, furosemide, benzonatate, and temazepam.  Meds ordered this encounter  Medications  . ergocalciferol (DRISDOL) 50000 units capsule    Sig: Take 1 capsule  (50,000 Units total) by mouth once a week.    Dispense:  12 capsule    Refill:  0    There are no discontinued medications.  Follow-up: No follow-ups on file.   [name], MD

## 2018-04-03 ENCOUNTER — Other Ambulatory Visit: Payer: Self-pay | Admitting: Internal Medicine

## 2018-04-03 DIAGNOSIS — R05 Cough: Secondary | ICD-10-CM

## 2018-04-03 DIAGNOSIS — R059 Cough, unspecified: Secondary | ICD-10-CM

## 2018-04-04 ENCOUNTER — Ambulatory Visit (INDEPENDENT_AMBULATORY_CARE_PROVIDER_SITE_OTHER): Payer: Self-pay | Admitting: Physician Assistant

## 2018-04-04 ENCOUNTER — Encounter: Payer: Self-pay | Admitting: Physician Assistant

## 2018-04-04 VITALS — BP 124/84 | HR 120 | Temp 98.6°F | Wt 234.0 lb

## 2018-04-04 DIAGNOSIS — R000 Tachycardia, unspecified: Secondary | ICD-10-CM

## 2018-04-04 DIAGNOSIS — R05 Cough: Secondary | ICD-10-CM

## 2018-04-04 DIAGNOSIS — R053 Chronic cough: Secondary | ICD-10-CM

## 2018-04-04 MED ORDER — BENZONATATE 200 MG PO CAPS: 200.0000 mg | ORAL_CAPSULE | Freq: Two times a day (BID) | ORAL | 0 refills | Status: DC | PRN

## 2018-04-04 MED ORDER — SUCRALFATE 1 G PO TABS: 1.0000 g | ORAL_TABLET | Freq: Three times a day (TID) | ORAL | 0 refills | Status: DC

## 2018-04-04 MED ORDER — BENZONATATE 200 MG PO CAPS: 200.0000 mg | ORAL_CAPSULE | Freq: Three times a day (TID) | ORAL | 0 refills | Status: DC | PRN

## 2018-04-04 MED ORDER — FAMOTIDINE 40 MG PO TABS: 40.0000 mg | ORAL_TABLET | Freq: Every day | ORAL | 0 refills | Status: DC

## 2018-04-04 NOTE — Telephone Encounter (Signed)
Refilled: 02/15/2018 Last OV: 02/20/2018 Next OV: 08/22/2018

## 2018-04-04 NOTE — Patient Instructions (Signed)
Please call your GI doctor and advised that she been having some difficulty with swallowing.  Hopefully they can do your scope when you get your repeat colonoscopy.  Move your Prilosec 40 mg to the first thing in the morning and ensure that you eat something 30 minutes after taking the medication.  This triples the effectiveness of that medication.  Okay to take famotidine before supper or just before bed, either will be fine.  Add in the sucralfate 4 times a day.  If at any point you begin having chest pain, shortness of breath, shortness of breath with exertion and please go to the emergency department.

## 2018-04-04 NOTE — Progress Notes (Signed)
04/04/2018 11:41 AM   DOB: [birth year] / MRN: [id number]  SUBJECTIVE:  Sheri [name] is a 62 y.o. female with history of well controlled diabetes presenting for 2.5 weeks of cough with post nasal drip.  The problem is improved with tessalon given by a previous provider which helped a great deal.  She does have a history of GERD and tells me this is poorly controlled at best.  She tells me "it will be horrible if I eat the wrong foods."  She has symptoms daily regardless of what she eats.  She is taking Prilosec 40 mg daily however takes this at night after eating.  She is willing to move this to the morning time and then eat about 30 minutes after.  She denies nasal congestion, ear itching, eye itching, mouth itching.  She tells me that she has an appointment coming up with her GI doctor.  She tells me she eats very slowly and feels that food does get stuck in her esophagus.  Current Outpatient Medications:  .  ALPRAZolam (XANAX) 0.5 MG tablet, TAKE ONE TABLET BY MOUTH TWICE DAILY AS NEEDED FOR AXNITEY OR SLEEP, Disp: 45 tablet, Rfl: 5 .  amitriptyline (ELAVIL) 25 MG tablet, Take 3 tablets (75 mg total) by mouth at bedtime. For neuropathy, Disp: 270 tablet, Rfl: 1 .  atorvastatin (LIPITOR) 20 MG tablet, TAKE 1 TABLET BY MOUTH DAILY., Disp: 90 tablet, Rfl: 3 .  furosemide (LASIX) 20 MG tablet, TAKE 1 TABLET BY MOUTH ONCE DAILY AS NEEDED FOR FLUID RETENTION, Disp: 30 tablet, Rfl: 3 .  lisinopril-hydrochlorothiazide (PRINZIDE,ZESTORETIC) 20-25 MG tablet, TAKE 1 TABLET BY MOUTH DAILY, Disp: 90 tablet, Rfl: 1 .  meloxicam (MOBIC) 15 MG tablet, Take 1 tablet (15 mg total) by mouth daily., Disp: 90 tablet, Rfl: 1 .  omeprazole (PRILOSEC) 40 MG capsule, TAKE 1 CAPSULE (40 MG TOTAL) BY MOUTH DAILY., Disp: 30 capsule, Rfl: 3 .  benzonatate (TESSALON) 200 MG capsule, Take 1 capsule (200 mg total) by mouth 3 (three) times daily as needed for cough., Disp: 20 capsule, Rfl: 0 .  ergocalciferol (DRISDOL)  50000 units capsule, Take 1 capsule (50,000 Units total) by mouth once a week. (Patient not taking: Reported on 04/04/2018), Disp: 12 capsule, Rfl: 0 .  famotidine (PEPCID) 40 MG tablet, Take 1 tablet (40 mg total) by mouth at bedtime., Disp: 30 tablet, Rfl: 0 .  glucose blood (ONE TOUCH TEST STRIPS) test strip, Use as instructed , Disp: , Rfl:  .  sucralfate (CARAFATE) 1 g tablet, Take 1 tablet (1 g total) by mouth 4 (four) times daily -  with meals and at bedtime., Disp: 120 tablet, Rfl: 0 .  temazepam (RESTORIL) 7.5 MG capsule, Take 1 capsule (7.5 mg total) by mouth at bedtime as needed for sleep. (Patient not taking: Reported on 04/04/2018), Disp: 90 capsule, Rfl: 0   She has No Known Allergies.   She  has a past medical history of Abdominal pain, epigastric, Diabetes mellitus, type 2 (HCC), Diverticulosis of colon without hemorrhage, Duodenitis with hemorrhage, Edema, Heart murmur, Hyperlipidemia, Hypertension, Mitral regurgitation, Obesity, unspecified, Osteopenia, Panic disorder without agoraphobia, Personal history of malignant neoplasm of cervix uteri, Personal history of other genital system and obstetric disorders(V13.29), Rash and other nonspecific skin eruption, Reflux esophagitis, Unspecified hereditary and idiopathic peripheral neuropathy, and Unspecified sleep apnea.    She  reports that she has never smoked. She has never used smokeless tobacco. She reports that she drinks alcohol. She reports that she  does not use drugs. She  has no sexual activity history on file. The patient  has a past surgical history that includes Right oophorectomy (2001) and Breast cyst aspiration.  Her family history includes Alzheimer's disease in her father; Coronary artery disease in her father; Diabetes in her father; Hypothyroidism in her mother; Osteopenia in her mother.  Review of Systems  Constitutional: Negative for chills, diaphoresis and fever.  Eyes: Negative.   Respiratory: Negative for cough,  hemoptysis, sputum production, shortness of breath and wheezing.   Cardiovascular: Negative for chest pain, orthopnea and leg swelling.  Gastrointestinal: Negative for abdominal pain and nausea.  Skin: Negative for rash.  Neurological: Negative for dizziness, sensory change, speech change, focal weakness and headaches.    The problem list and medications were reviewed and updated by myself where necessary and exist elsewhere in the encounter.   OBJECTIVE:  BP 124/84   Pulse (!) 120   Temp 98.6 F (37 C)   Wt 234 lb (106.1 kg)   SpO2 98%   BMI 40.17 kg/m   Wt Readings from Last 3 Encounters:  04/04/18 234 lb (106.1 kg)  02/20/18 236 lb 9.6 oz (107.3 kg)  02/15/18 231 lb 6.4 oz (105 kg)   Temp Readings from Last 3 Encounters:  04/04/18 98.6 F (37 C)  02/20/18 98.1 F (36.7 C) (Oral)  02/15/18 98.1 F (36.7 C) (Oral)   BP Readings from Last 3 Encounters:  04/04/18 124/84  02/20/18 120/86  02/15/18 112/74   Pulse Readings from Last 3 Encounters:  04/04/18 (!) 120  02/20/18 86  02/15/18 (!) 112    Physical Exam  Constitutional: She is oriented to person, place, and time. She appears well-nourished. No distress.  Eyes: Pupils are equal, round, and reactive to light. EOM are normal.  Cardiovascular: Normal rate, regular rhythm, S1 normal, S2 normal, normal heart sounds and intact distal pulses. Exam reveals no gallop, no friction rub and no decreased pulses.  No murmur heard. Pulmonary/Chest: Effort normal. No stridor. No respiratory distress. She has no wheezes. She has no rales.  Abdominal: She exhibits no distension.  Musculoskeletal: She exhibits no edema.  Neurological: She is alert and oriented to person, place, and time. No cranial nerve deficit. Gait normal.  Skin: Skin is dry. She is not diaphoretic.  Psychiatric: She has a normal mood and affect.  Vitals reviewed.   Lab Results  Component Value Date   HGBA1C 6.6 (H) 02/15/2018    Lab Results   Component Value Date   WBC 5.1 05/11/2013   HGB 13.7 05/11/2013   HCT 40.0 05/11/2013   MCV 86.7 05/11/2013   PLT 301.0 05/11/2013    Lab Results  Component Value Date   CREATININE 0.67 02/15/2018   BUN 21 02/15/2018   NA 137 02/15/2018   K 3.7 02/15/2018   CL 103 02/15/2018   CO2 23 02/15/2018    Lab Results  Component Value Date   ALT 34 02/15/2018   AST 25 02/15/2018   ALKPHOS 65 02/15/2018   BILITOT 0.8 02/15/2018    Lab Results  Component Value Date   TSH 1.02 05/11/2013    Lab Results  Component Value Date   CHOL 163 02/15/2018   HDL 38.50 (L) 02/15/2018   LDLCALC 87 02/15/2018   LDLDIRECT 97.0 08/11/2016   TRIG 185.0 (H) 02/15/2018   CHOLHDL 4 02/15/2018     ASSESSMENT AND PLAN:  Storm was seen today for choice-cough/nasal drip.  Diagnoses and all orders for  this visit:  Chronic cough Comments: Most likely upper airway.  PPI in the morning, H2 blocker at night, sucralfate during the day. Orders: -     famotidine (PEPCID) 40 MG tablet; Take 1 tablet (40 mg total) by mouth at bedtime. -     benzonatate (TESSALON) 200 MG capsule; Take 1 capsule (200 mg total) by mouth 3 (three) times daily as needed for cough. -     sucralfate (CARAFATE) 1 g tablet; Take 1 tablet (1 g total) by mouth 4 (four) times daily -  with meals and at bedtime.  Chronic tachycardia Comments: Patient having no chest pain or shortness of breath today.    The patient is advised to call or return to clinic if she does not see an improvement in symptoms, or to seek the care of the closest emergency department if she worsens with the above plan.   Deliah Boston, MHS, PA-C Primary Care at [facility] 04/04/2018 11:41 AM

## 2018-04-04 NOTE — Telephone Encounter (Signed)
SHE NEEDS TO BE SEEN , COUGHING SINCE MAY.   NEEDS CHEST X RAY PRIOR TO VISIT;  I HAVE ORDERED IT AND I HAVE REFILLED MED.

## 2018-04-06 NOTE — Telephone Encounter (Signed)
Spoke with pt and she stated that she saw the PA at the employee clinic at [facility] yesterday because she was just coughing so bad and has been for the last 3-4 weeks. The pt stated that the PA seems to think that it is more acid reflux related and he prescribed her pepcid to take at bedtime and carafate to take 4 times daily as well at taking the omeprazole 40mg  first thing in the morning. The pt stated that she sees GI tomorrow for her consultation to get her colonoscopy scheduled so she is going to see if they will do an endoscopy as well per PA recommendation(office note is in epic). The pt stated while on the phone that she is having some issues with swallowing and in the evenings she stated "it feels like I have a neuse around my neck" and that she has to eat really smalls bites of food. Pt is going to come in to have her xray done probably tomorrow after her GI appt.

## 2018-04-07 ENCOUNTER — Telehealth: Payer: Self-pay | Admitting: Emergency Medicine

## 2018-04-07 ENCOUNTER — Ambulatory Visit (INDEPENDENT_AMBULATORY_CARE_PROVIDER_SITE_OTHER): Payer: 59

## 2018-04-07 ENCOUNTER — Other Ambulatory Visit: Payer: Self-pay | Admitting: Gastroenterology

## 2018-04-07 DIAGNOSIS — R05 Cough: Secondary | ICD-10-CM

## 2018-04-07 DIAGNOSIS — R1314 Dysphagia, pharyngoesophageal phase: Secondary | ICD-10-CM

## 2018-04-07 DIAGNOSIS — K2270 Barrett's esophagus without dysplasia: Secondary | ICD-10-CM | POA: Insufficient documentation

## 2018-04-07 DIAGNOSIS — Z1211 Encounter for screening for malignant neoplasm of colon: Secondary | ICD-10-CM | POA: Diagnosis not present

## 2018-04-07 DIAGNOSIS — R059 Cough, unspecified: Secondary | ICD-10-CM

## 2018-04-07 NOTE — Telephone Encounter (Signed)
Left message follow up call from visit with [facility]. 

## 2018-04-07 NOTE — Telephone Encounter (Signed)
Patient did contacted office back stating that she saw Dr Derrel Nip today and per patient carafate and omeprazole was increased.  Cxr was order also upcoming barren swallow.

## 2018-04-11 ENCOUNTER — Ambulatory Visit
Admission: RE | Admit: 2018-04-11 | Discharge: 2018-04-11 | Disposition: A | Discharge status: Home / Self Care | Payer: 59 | Source: Ambulatory Visit | Attending: Gastroenterology | Admitting: Gastroenterology

## 2018-04-11 DIAGNOSIS — M2578 Osteophyte, vertebrae: Secondary | ICD-10-CM | POA: Insufficient documentation

## 2018-04-11 DIAGNOSIS — K219 Gastro-esophageal reflux disease without esophagitis: Secondary | ICD-10-CM | POA: Diagnosis not present

## 2018-04-11 DIAGNOSIS — R1314 Dysphagia, pharyngoesophageal phase: Secondary | ICD-10-CM | POA: Diagnosis not present

## 2018-04-11 DIAGNOSIS — K2270 Barrett's esophagus without dysplasia: Secondary | ICD-10-CM | POA: Insufficient documentation

## 2018-04-12 ENCOUNTER — Encounter: Payer: Self-pay | Admitting: Internal Medicine

## 2018-04-19 ENCOUNTER — Other Ambulatory Visit: Payer: Self-pay | Admitting: Internal Medicine

## 2018-04-19 NOTE — Telephone Encounter (Signed)
Refilled: 10/21/2017 Last OV: 02/20/2018 Next OV: 08/22/2018

## 2018-04-20 NOTE — Telephone Encounter (Signed)
Printed, signed and faxed.  

## 2018-04-26 ENCOUNTER — Encounter: Payer: Self-pay | Admitting: Internal Medicine

## 2018-04-27 MED ORDER — OMEPRAZOLE 40 MG PO CPDR: 40.0000 mg | DELAYED_RELEASE_CAPSULE | Freq: Two times a day (BID) | ORAL | 1 refills | Status: DC

## 2018-05-18 ENCOUNTER — Encounter (INDEPENDENT_AMBULATORY_CARE_PROVIDER_SITE_OTHER): Payer: 59

## 2018-05-23 LAB — HM COLONOSCOPY

## 2018-05-30 ENCOUNTER — Encounter (INDEPENDENT_AMBULATORY_CARE_PROVIDER_SITE_OTHER): Payer: Self-pay | Admitting: Family Medicine

## 2018-05-30 ENCOUNTER — Ambulatory Visit (INDEPENDENT_AMBULATORY_CARE_PROVIDER_SITE_OTHER): Payer: 59 | Admitting: Family Medicine

## 2018-05-30 VITALS — BP 125/84 | HR 99 | Temp 98.0°F | Ht 63.0 in | Wt 227.0 lb

## 2018-05-30 DIAGNOSIS — Z9189 Other specified personal risk factors, not elsewhere classified: Secondary | ICD-10-CM

## 2018-05-30 DIAGNOSIS — R739 Hyperglycemia, unspecified: Secondary | ICD-10-CM | POA: Diagnosis not present

## 2018-05-30 DIAGNOSIS — Z6841 Body Mass Index (BMI) 40.0 and over, adult: Secondary | ICD-10-CM

## 2018-05-30 DIAGNOSIS — R059 Cough, unspecified: Secondary | ICD-10-CM

## 2018-05-30 DIAGNOSIS — E119 Type 2 diabetes mellitus without complications: Secondary | ICD-10-CM | POA: Diagnosis not present

## 2018-05-30 DIAGNOSIS — Z0289 Encounter for other administrative examinations: Secondary | ICD-10-CM

## 2018-05-30 DIAGNOSIS — R0602 Shortness of breath: Secondary | ICD-10-CM

## 2018-05-30 DIAGNOSIS — R05 Cough: Secondary | ICD-10-CM

## 2018-05-30 DIAGNOSIS — Z1331 Encounter for screening for depression: Secondary | ICD-10-CM

## 2018-05-30 DIAGNOSIS — R5383 Other fatigue: Secondary | ICD-10-CM

## 2018-05-31 LAB — COMPREHENSIVE METABOLIC PANEL
ALT: 30 IU/L (ref 0–32)
AST: 22 IU/L (ref 0–40)
Albumin/Globulin Ratio: 1.7 (ref 1.2–2.2)
Albumin: 4.6 g/dL (ref 3.6–4.8)
Alkaline Phosphatase: 88 IU/L (ref 39–117)
BUN / CREAT RATIO: 27 (ref 12–28)
BUN: 19 mg/dL (ref 8–27)
Bilirubin Total: 0.8 mg/dL (ref 0.0–1.2)
CO2: 20 mmol/L (ref 20–29)
CREATININE: 0.71 mg/dL (ref 0.57–1.00)
Calcium: 9.5 mg/dL (ref 8.7–10.3)
Chloride: 100 mmol/L (ref 96–106)
GFR, EST AFRICAN AMERICAN: 106 mL/min/{1.73_m2} (ref >59)
GFR, EST NON AFRICAN AMERICAN: 92 mL/min/{1.73_m2} (ref >59)
GLUCOSE: 112 mg/dL — AB (ref 65–99)
Globulin, Total: 2.7 g/dL (ref 1.5–4.5)
Potassium: 4.3 mmol/L (ref 3.5–5.2)
Sodium: 137 mmol/L (ref 134–144)
TOTAL PROTEIN: 7.3 g/dL (ref 6.0–8.5)

## 2018-05-31 LAB — CBC WITH DIFFERENTIAL
BASOS ABS: 0 10*3/uL (ref 0.0–0.2)
Basos: 1 %
EOS (ABSOLUTE): 0.1 10*3/uL (ref 0.0–0.4)
EOS: 2 %
Hematocrit: 43.8 % (ref 34.0–46.6)
Hemoglobin: 14.4 g/dL (ref 11.1–15.9)
IMMATURE GRANS (ABS): 0 10*3/uL (ref 0.0–0.1)
IMMATURE GRANULOCYTES: 0 %
LYMPHS: 33 %
Lymphocytes Absolute: 2.2 10*3/uL (ref 0.7–3.1)
MCH: 28.8 pg (ref 26.6–33.0)
MCHC: 32.9 g/dL (ref 31.5–35.7)
MCV: 88 fL (ref 79–97)
Monocytes Absolute: 0.5 10*3/uL (ref 0.1–0.9)
Monocytes: 7 %
Neutrophils Absolute: 3.7 10*3/uL (ref 1.4–7.0)
Neutrophils: 57 %
RBC: 5 x10E6/uL (ref 3.77–5.28)
RDW: 11.9 % — ABNORMAL LOW (ref 12.3–15.4)
WBC: 6.6 10*3/uL (ref 3.4–10.8)

## 2018-05-31 LAB — MICROALBUMIN / CREATININE URINE RATIO
Creatinine, Urine: 205.6 mg/dL
MICROALB/CREAT RATIO: 7.2 mg/g{creat} (ref 0.0–30.0)
MICROALBUM., U, RANDOM: 14.9 ug/mL

## 2018-05-31 LAB — INSULIN, RANDOM: INSULIN: 20.8 u[IU]/mL (ref 2.6–24.9)

## 2018-05-31 LAB — FOLATE: Folate: 3.9 ng/mL (ref >3.0)

## 2018-05-31 LAB — LIPID PANEL WITH LDL/HDL RATIO
CHOLESTEROL TOTAL: 165 mg/dL (ref 100–199)
HDL: 40 mg/dL (ref >39)
LDL Calculated: 78 mg/dL (ref 0–99)
LDl/HDL Ratio: 2 ratio (ref 0.0–3.2)
TRIGLYCERIDES: 234 mg/dL — AB (ref 0–149)
VLDL Cholesterol Cal: 47 mg/dL — ABNORMAL HIGH (ref 5–40)

## 2018-05-31 LAB — T4, FREE: Free T4: 1.1 ng/dL (ref 0.82–1.77)

## 2018-05-31 LAB — HEMOGLOBIN A1C
ESTIMATED AVERAGE GLUCOSE: 137 mg/dL
HEMOGLOBIN A1C: 6.4 % — AB (ref 4.8–5.6)

## 2018-05-31 LAB — T3: T3, Total: 141 ng/dL (ref 71–180)

## 2018-05-31 LAB — TSH: TSH: 2.94 u[IU]/mL (ref 0.450–4.500)

## 2018-05-31 LAB — VITAMIN D 25 HYDROXY (VIT D DEFICIENCY, FRACTURES): VIT D 25 HYDROXY: 26.6 ng/mL — AB (ref 30.0–100.0)

## 2018-05-31 LAB — VITAMIN B12: Vitamin B-12: 426 pg/mL (ref 232–1245)

## 2018-05-31 NOTE — Progress Notes (Signed)
.  Office: [phone]  /  Fax: [phone]   HPI:   Chief Complaint: OBESITY  Sheri [name] (MR# [id number]) is a 62 y.o. female who presents on 05/31/2018 for obesity evaluation and treatment. Current BMI is Body mass index is 40.21 kg/m.Sheri [name] has struggled with obesity for years and has been unsuccessful in either losing weight or maintaining long term weight loss. Sheri [name] attended our information session and states she is currently in the action stage of change and ready to dedicate time achieving and maintaining a healthier weight.  Sheri [name] states her family eats meals together she thinks her family will eat healthier with  her she struggles with family and or coworkers weight loss sabotage her desired weight loss is 94 lbs she started gaining weight in the last 11 yrs her heaviest weight ever was 234 lbs. she has significant food cravings issues  she snacks frequently in the evenings she skips meals frequently she is frequently drinking liquids with calories she frequently makes poor food choices she has problems with excessive hunger  she frequently eats larger portions than normal  she has binge eating behaviors she struggles with emotional eating    Sheri [name] feels her energy is lower than it should be. This has worsened with weight gain and has not worsened recently. Sheri [name] admits to daytime somnolence and  admits to waking up still tired. Patient is at risk for obstructive sleep apnea. Patent has a history of symptoms of daytime Sheri, morning Sheri and morning headache. Patient generally gets 6 hours of sleep per night, and states they generally have restless sleep. Snoring is present. Apneic episodes are not present. Epworth Sleepiness Score is 8  Dyspnea on exertion Sheri [name] notes increasing shortness of breath with exercising and seems to be worsening over time with weight gain. She notes getting out of breath sooner with activity than she used to. This has not gotten worse  recently. Sheri [name] denies orthopnea.  Hyperglycemia Sheri [name] has a diagnosis of hyperglycemia with an A1c of 6.6 this year.She has no history of diabetes in Epic Sheri [name] admits to polyphagia.  Cough Sheri [name] notes a chronic non productive cough for three months. Her cough is worse in the evening, worse with increased talking and it comes in waves. Sheri [name] has a history of GERD and Barrett's esophagus. She has an upper GI scheduled soon.  At risk for cardiovascular disease Sheri [name] is at a higher than average risk for cardiovascular disease due to obesity. She currently denies any chest pain.  Depression Screen Sheri [name]'s Food and Mood (modified PHQ-9) score was  Depression screen PHQ 2/9 05/30/2018  Decreased Interest 3  Down, Depressed, Hopeless 3  PHQ - 2 Score 6  Altered sleeping 3  Tired, decreased energy 3  Change in appetite 3  Feeling bad or failure about yourself  3  Trouble concentrating 3  Moving slowly or fidgety/restless 3  Suicidal thoughts 2  PHQ-9 Score 26  Difficult doing work/chores Somewhat difficult    ALLERGIES: No Known Allergies  MEDICATIONS: Current Outpatient Medications on File Prior to Visit  Medication Sig Dispense Refill  . ALPRAZolam (XANAX) 0.5 MG tablet TAKE ONE TABLET TWICE DAILY AS NEEDED FOR AXNITEY OR SLEEP 45 tablet 5  . amitriptyline (ELAVIL) 25 MG tablet Take 3 tablets (75 mg total) by mouth at bedtime. For neuropathy 270 tablet 1  . atorvastatin (LIPITOR) 20 MG tablet TAKE 1 TABLET BY MOUTH DAILY. 90 tablet 3  . benzonatate (TESSALON) 200 MG capsule Take 1 capsule (  200 mg total) by mouth 3 (three) times daily as needed for cough. 20 capsule 0  . furosemide (LASIX) 20 MG tablet TAKE 1 TABLET BY MOUTH ONCE DAILY AS NEEDED FOR FLUID RETENTION 30 tablet 3  . glucose blood (ONE TOUCH TEST STRIPS) test strip Use as instructed     . lisinopril-hydrochlorothiazide (PRINZIDE,ZESTORETIC) 20-25 MG tablet TAKE 1 TABLET BY MOUTH DAILY 90 tablet 1  . meloxicam (MOBIC) 15 MG tablet Take 1  tablet (15 mg total) by mouth daily. 90 tablet 1  . omeprazole (PRILOSEC) 40 MG capsule Take 1 capsule (40 mg total) by mouth 2 (two) times daily. 180 capsule 1   No current facility-administered medications on file prior to visit.     PAST MEDICAL HISTORY: Past Medical History:  Diagnosis Date  . Abdominal pain, epigastric   . Anxiety   . Back pain   . Diabetes mellitus, type 2 ([place])   . Diverticulosis of colon without hemorrhage   . Duodenitis with hemorrhage   . Edema   . Fluid retention   . GERD (gastroesophageal reflux disease)   . Heart murmur   . Hyperlipidemia   . Hypertension   . Leg edema   . Mitral regurgitation   . Obesity, unspecified   . Osteopenia   . Panic disorder without agoraphobia   . Peripheral neuropathy   . Personal history of malignant neoplasm of cervix uteri    CIN-3 1989, normal since, gets pap smears annually  . Personal history of other genital system and obstetric disorders(V13.29)   . Prediabetes   . Rash and other nonspecific skin eruption   . Reflux esophagitis   . Swallowing difficulty   . Unspecified hereditary and idiopathic peripheral neuropathy   . Unspecified sleep apnea    sleep study- [facility]  . Vitamin D deficiency     PAST SURGICAL HISTORY: Past Surgical History:  Procedure Laterality Date  . BREAST CYST ASPIRATION     right , benign  . DILATION AND CURETTAGE OF UTERUS    . RIGHT OOPHORECTOMY  2001   menopause, endometriosis s/p.  HRT  since- has not tolerated previous attempts to wean    SOCIAL HISTORY: Social History   Tobacco Use  . Smoking status: Never Smoker  . Smokeless tobacco: Never Used  Substance Use Topics  . Alcohol use: Yes    Alcohol/week: 0.0 standard drinks    Comment: rare  . Drug use: No    FAMILY HISTORY: Family History  Problem Relation Age of Onset  . Osteopenia Mother   . Hypothyroidism Mother   . Diabetes Mother   . Hyperlipidemia Mother   . Hypertension Mother   . Obesity Mother    . Diabetes Father   . Alzheimer's disease Father   . Coronary artery disease Father   . Obesity Father   . Cancer Neg Hx        breast, ovarian, colon    ROS: Review of Systems  Constitutional: Positive for malaise/Sheri.  HENT:       Dry Mouth  Eyes:       Wear Glasses or Contacts  Respiratory: Positive for cough.   Cardiovascular: Negative for chest pain and orthopnea.       Leg Cramping  Gastrointestinal: Positive for heartburn.  Musculoskeletal: Positive for back pain.  Endo/Heme/Allergies:       Positive for polyphagia  Psychiatric/Behavioral: The patient is nervous/anxious (nervousness).        Stress    PHYSICAL EXAM:  Blood pressure 125/84, pulse 99, temperature 98 F (36.7 C), temperature source Oral, height 5\' 3"  (1.6 m), weight 227 lb (103 kg), SpO2 99 %. Body mass index is 40.21 kg/m. Physical Exam  Constitutional: She is oriented to person, place, and time. She appears well-developed and well-nourished.  HENT:  Head: Normocephalic and atraumatic.  Nose: Nose normal.  Eyes: EOM are normal. No scleral icterus.  Neck: Normal range of motion. Neck supple. No thyromegaly present.  Cardiovascular: Normal rate and regular rhythm.  Pulmonary/Chest: Effort normal. No respiratory distress.  Abdominal: Soft. She exhibits no distension.  + obesity  Musculoskeletal: Normal range of motion.  Range of Motion normal in all 4 extremities  Neurological: She is alert and oriented to person, place, and time. Coordination normal.  Skin: Skin is warm and dry.  Psychiatric: She has a normal mood and affect. Her behavior is normal.  Vitals reviewed.   RECENT LABS AND TESTS: BMET    Component Value Date/Time   NA 137 02/15/2018 0905   NA 143 05/11/2013 0000   NA 139 04/18/2012 0931   K 3.7 02/15/2018 0905   K 4.1 04/18/2012 0931   CL 103 02/15/2018 0905   CL 106 04/18/2012 0931   CO2 23 02/15/2018 0905   CO2 23 04/18/2012 0931   GLUCOSE 160 (H) 02/15/2018 0905    GLUCOSE 105 (H) 04/18/2012 0931   BUN 21 02/15/2018 0905   BUN 18 05/11/2013 0000   BUN 13 04/18/2012 0931   CREATININE 0.67 02/15/2018 0905   CREATININE 0.63 04/18/2012 0931   CALCIUM 9.6 02/15/2018 0905   CALCIUM 8.9 04/18/2012 0931   GFRNONAA 100 05/11/2013 0000   GFRNONAA >60 04/18/2012 0931   GFRAA 115 05/11/2013 0000   GFRAA >60 04/18/2012 0931   Lab Results  Component Value Date   HGBA1C 6.6 (H) 02/15/2018   No results found for: INSULIN CBC    Component Value Date/Time   WBC 5.1 05/11/2013 0928   RBC 4.62 05/11/2013 0928   HGB 13.7 05/11/2013 0928   HCT 40.0 05/11/2013 0928   PLT 301.0 05/11/2013 0928   MCV 86.7 05/11/2013 0928   MCHC 34.3 05/11/2013 0928   RDW 12.0 05/11/2013 0928   LYMPHSABS 1.7 05/11/2013 0928   MONOABS 0.5 05/11/2013 0928   EOSABS 0.1 05/11/2013 0928   BASOSABS 0.0 05/11/2013 0928   Iron/TIBC/Ferritin/ %Sat No results found for: IRON, TIBC, FERRITIN, IRONPCTSAT Lipid Panel     Component Value Date/Time   CHOL 163 02/15/2018 0905   TRIG 185.0 (H) 02/15/2018 0905   HDL 38.50 (L) 02/15/2018 0905   CHOLHDL 4 02/15/2018 0905   VLDL 37.0 02/15/2018 0905   LDLCALC 87 02/15/2018 0905   LDLDIRECT 97.0 08/11/2016 0853   Hepatic Function Panel     Component Value Date/Time   PROT 7.7 02/15/2018 0905   PROT 6.8 05/11/2013 0000   PROT 7.5 04/18/2012 0931   ALBUMIN 4.5 02/15/2018 0905   ALBUMIN 4.6 05/11/2013 0000   ALBUMIN 4.0 04/18/2012 0931   AST 25 02/15/2018 0905   AST 26 04/18/2012 0931   ALT 34 02/15/2018 0905   ALT 26 04/18/2012 0931   ALKPHOS 65 02/15/2018 0905   ALKPHOS 55 04/18/2012 0931   BILITOT 0.8 02/15/2018 0905   BILITOT 0.8 04/18/2012 0931      Component Value Date/Time   TSH 1.02 05/11/2013 0928   Vitamin D There are no recent lab results  ECG  shows NSR with a rate of 75 BPM INDIRECT  CALORIMETER done today shows a VO2 of 312 and a REE of 2171. Her calculated basal metabolic rate is 4259 thus her basal  metabolic rate is better than expected.    ASSESSMENT AND PLAN: Other Sheri - Plan: EKG 12-Lead, Vitamin B12, Folate, Lipid Panel With LDL/HDL Ratio, T3, T4, free, TSH, VITAMIN D 25 Hydroxy (Vit-D Deficiency, Fractures), CBC With Differential  Shortness of breath on exertion  Hyperglycemia - Plan: Comprehensive metabolic panel, Hemoglobin A1c, Insulin, random  Cough  Depression screening  At risk for heart disease  Class 3 severe obesity with serious comorbidity and body mass index (BMI) of 40.0 to 44.9 in adult, unspecified obesity type (HCC)  PLAN:  Sheri Lynnley was informed that her Sheri may be related to obesity, depression or many other causes. Labs will be ordered, and in the meanwhile Malena has agreed to work on diet, exercise and weight loss to help with Sheri. Proper sleep hygiene was discussed including the need for 7-8 hours of quality sleep each night. A sleep study was not ordered based on symptoms and Epworth score.  Dyspnea on exertion Shayley's shortness of breath appears to be obesity related and exercise induced. She has agreed to work on weight loss and gradually increase exercise to treat her exercise induced shortness of breath. If Emaya follows our instructions and loses weight without improvement of her shortness of breath, we will plan to refer to pulmonology. We will monitor this condition regularly. Audianna agrees to this plan.  Hyperglycemia Fasting labs will be obtained and results with be discussed with Kayson in 2 weeks at her follow up visit. In the meanwhile Zasha was started on a lower simple carbohydrate diet and will work on weight loss efforts.  Cough Sahaana is to keep her GI appointment. She was advised to start OTC Zyrtec D for two weeks to see if this is related to post nasal drip and follow up in two weeks.  Cardiovascular risk counseling Nikolina was given extended (15 minutes) coronary artery disease prevention counseling today. She is 62 y.o. female and has risk  factors for heart disease including obesity. We discussed intensive lifestyle modifications today with an emphasis on specific weight loss instructions and strategies. Pt was also informed of the importance of increasing exercise and decreasing saturated fats to help prevent heart disease.  Depression Screen Delissa had a strongly positive depression screening. Depression is commonly associated with obesity and often results in emotional eating behaviors. We will monitor this closely and work on CBT to help improve the non-hunger eating patterns. Referral to Psychology may be required if no improvement is seen as she continues in our clinic.  Obesity Noheli is currently in the action stage of change and her goal is to continue with weight loss efforts She has agreed to follow the Category 3 plan Jalaiyah has been instructed to work up to a goal of 150 minutes of combined cardio and strengthening exercise per week for weight loss and overall health benefits. We discussed the following Behavioral Modification Strategies today: increasing lean protein intake, decreasing simple carbohydrates  and work on meal planning and easy cooking plans  Tariah has agreed to follow up with our clinic in 2 weeks. She was informed of the importance of frequent follow up visits to maximize her success with intensive lifestyle modifications for her multiple health conditions. She was informed we would discuss her lab results at her next visit unless there is a critical issue that needs to be addressed sooner. Sheri [name]  agreed to keep her next visit at the agreed upon time to discuss these results.    OBESITY BEHAVIORAL INTERVENTION VISIT  Today's visit was # 1   Starting weight: 227 lbs Starting date: 05/30/18 Today's weight : 227 lbs  Today's date: 05/30/2018 Total lbs lost to date: 0   ASK: We discussed the diagnosis of obesity with Aleen Sells [place] today and Maleiah agreed to give [name] permission to discuss obesity behavioral modification  therapy today.  ASSESS: Denese has the diagnosis of obesity and her BMI today is 40.22 Maame is in the action stage of change   ADVISE: Lunden was educated on the multiple health risks of obesity as well as the benefit of weight loss to improve her health. She was advised of the need for long term treatment and the importance of lifestyle modifications to improve her current health and to decrease her risk of future health problems.  AGREE: Multiple dietary modification options and treatment options were discussed and  Lilliahna agreed to follow the recommendations documented in the above note.  ARRANGE: Naomi was educated on the importance of frequent visits to treat obesity as outlined per CMS and USPSTF guidelines and agreed to schedule her next follow up appointment today.   I, Doreene Nest, am acting as transcriptionist for Dennard Nip, MD   I have reviewed the above documentation for accuracy and completeness, and I agree with the above. -Dennard Nip, MD

## 2018-06-01 ENCOUNTER — Other Ambulatory Visit: Payer: Self-pay | Admitting: Internal Medicine

## 2018-06-01 DIAGNOSIS — Z1231 Encounter for screening mammogram for malignant neoplasm of breast: Secondary | ICD-10-CM

## 2018-06-08 ENCOUNTER — Encounter: Payer: Self-pay | Admitting: *Deleted

## 2018-06-09 ENCOUNTER — Ambulatory Visit: Payer: 59 | Admitting: Certified Registered Nurse Anesthetist

## 2018-06-09 ENCOUNTER — Encounter
Admission: RE | Disposition: A | Discharge status: Home / Self Care | Payer: Self-pay | Source: Ambulatory Visit | Attending: Gastroenterology

## 2018-06-09 ENCOUNTER — Ambulatory Visit
Admission: RE | Admit: 2018-06-09 | Discharge: 2018-06-09 | Disposition: A | Discharge status: Home / Self Care | Payer: 59 | Source: Ambulatory Visit | Attending: Gastroenterology | Admitting: Gastroenterology

## 2018-06-09 ENCOUNTER — Encounter: Payer: Self-pay | Admitting: *Deleted

## 2018-06-09 DIAGNOSIS — F419 Anxiety disorder, unspecified: Secondary | ICD-10-CM | POA: Diagnosis not present

## 2018-06-09 DIAGNOSIS — K210 Gastro-esophageal reflux disease with esophagitis: Secondary | ICD-10-CM | POA: Insufficient documentation

## 2018-06-09 DIAGNOSIS — K2950 Unspecified chronic gastritis without bleeding: Secondary | ICD-10-CM | POA: Diagnosis not present

## 2018-06-09 DIAGNOSIS — E1142 Type 2 diabetes mellitus with diabetic polyneuropathy: Secondary | ICD-10-CM | POA: Insufficient documentation

## 2018-06-09 DIAGNOSIS — I10 Essential (primary) hypertension: Secondary | ICD-10-CM | POA: Insufficient documentation

## 2018-06-09 DIAGNOSIS — E785 Hyperlipidemia, unspecified: Secondary | ICD-10-CM | POA: Diagnosis not present

## 2018-06-09 DIAGNOSIS — G4730 Sleep apnea, unspecified: Secondary | ICD-10-CM | POA: Diagnosis not present

## 2018-06-09 DIAGNOSIS — K5790 Diverticulosis of intestine, part unspecified, without perforation or abscess without bleeding: Secondary | ICD-10-CM | POA: Diagnosis not present

## 2018-06-09 DIAGNOSIS — E669 Obesity, unspecified: Secondary | ICD-10-CM | POA: Insufficient documentation

## 2018-06-09 DIAGNOSIS — K5730 Diverticulosis of large intestine without perforation or abscess without bleeding: Secondary | ICD-10-CM | POA: Diagnosis not present

## 2018-06-09 DIAGNOSIS — R1310 Dysphagia, unspecified: Secondary | ICD-10-CM | POA: Diagnosis not present

## 2018-06-09 DIAGNOSIS — K2210 Ulcer of esophagus without bleeding: Secondary | ICD-10-CM | POA: Diagnosis not present

## 2018-06-09 DIAGNOSIS — Z79899 Other long term (current) drug therapy: Secondary | ICD-10-CM | POA: Insufficient documentation

## 2018-06-09 DIAGNOSIS — K449 Diaphragmatic hernia without obstruction or gangrene: Secondary | ICD-10-CM | POA: Diagnosis not present

## 2018-06-09 DIAGNOSIS — M8580 Other specified disorders of bone density and structure, unspecified site: Secondary | ICD-10-CM | POA: Diagnosis not present

## 2018-06-09 DIAGNOSIS — K2960 Other gastritis without bleeding: Secondary | ICD-10-CM | POA: Insufficient documentation

## 2018-06-09 DIAGNOSIS — Z8719 Personal history of other diseases of the digestive system: Secondary | ICD-10-CM | POA: Diagnosis not present

## 2018-06-09 DIAGNOSIS — Z6839 Body mass index (BMI) 39.0-39.9, adult: Secondary | ICD-10-CM | POA: Insufficient documentation

## 2018-06-09 DIAGNOSIS — Z1211 Encounter for screening for malignant neoplasm of colon: Secondary | ICD-10-CM | POA: Insufficient documentation

## 2018-06-09 DIAGNOSIS — E1140 Type 2 diabetes mellitus with diabetic neuropathy, unspecified: Secondary | ICD-10-CM | POA: Diagnosis not present

## 2018-06-09 DIAGNOSIS — K2970 Gastritis, unspecified, without bleeding: Secondary | ICD-10-CM | POA: Diagnosis not present

## 2018-06-09 HISTORY — PX: ESOPHAGOGASTRODUODENOSCOPY: SHX5428

## 2018-06-09 HISTORY — PX: COLONOSCOPY WITH PROPOFOL: SHX5780

## 2018-06-09 SURGERY — EGD (ESOPHAGOGASTRODUODENOSCOPY)
Anesthesia: General

## 2018-06-09 MED ORDER — PROPOFOL 500 MG/50ML IV EMUL
INTRAVENOUS | Status: AC
  Filled 2018-06-09: qty 50

## 2018-06-09 MED ORDER — PROPOFOL 500 MG/50ML IV EMUL
INTRAVENOUS | Status: DC | PRN
  Administered 2018-06-09: 70 ug/kg/min via INTRAVENOUS

## 2018-06-09 MED ORDER — SODIUM CHLORIDE 0.9 % IV SOLN
INTRAVENOUS | Status: DC
  Administered 2018-06-09: 11:00:00 via INTRAVENOUS

## 2018-06-09 MED ORDER — PROPOFOL 10 MG/ML IV BOLUS
INTRAVENOUS | Status: DC | PRN
  Administered 2018-06-09: 20 mg via INTRAVENOUS
  Administered 2018-06-09 (×3): 50 mg via INTRAVENOUS
  Administered 2018-06-09: 70 mg via INTRAVENOUS

## 2018-06-09 MED ORDER — PROPOFOL 10 MG/ML IV BOLUS
INTRAVENOUS | Status: AC
  Filled 2018-06-09: qty 20

## 2018-06-09 NOTE — H&P (Signed)
Outpatient short stay form Pre-procedure 06/09/2018 10:48 AM Lollie Sails MD  Primary Physician: Dr. Deborra Medina  Reason for visit: EGD and colonoscopy  History of present illness: Patient is a 62 year old female presenting today as above.  She has personal tree of Barrett's esophagus with reflux.  She also has some occasional dysphagia.  This seems to be more so related with a globus sensation.  She has had a barium swallow which was normal to the passage of a tablet however she does have a prominent cricopharyngeus muscle as well as prominent anterior cervical osteophytes.  She does have a chronic cough as well she has not seen an ENT at this point.  Last colonoscopy was about 10 years ago showing only diverticulosis.  She tolerated her prep well.  She takes no aspirin or blood thinning agent.    Current Facility-Administered Medications:  .  0.9 %  sodium chloride infusion, , Intravenous, Continuous, Lollie Sails, MD  Medications Prior to Admission  Medication Sig Dispense Refill Last Dose  . ALPRAZolam (XANAX) 0.5 MG tablet TAKE ONE TABLET TWICE DAILY AS NEEDED FOR AXNITEY OR SLEEP 45 tablet 5 Past Week at Unknown time  . amitriptyline (ELAVIL) 25 MG tablet Take 3 tablets (75 mg total) by mouth at bedtime. For neuropathy 270 tablet 1 Past Week at Unknown time  . atorvastatin (LIPITOR) 20 MG tablet TAKE 1 TABLET BY MOUTH DAILY. 90 tablet 3 Past Week at Unknown time  . benzonatate (TESSALON) 200 MG capsule Take 1 capsule (200 mg total) by mouth 3 (three) times daily as needed for cough. 20 capsule 0 Past Week at Unknown time  . lisinopril-hydrochlorothiazide (PRINZIDE,ZESTORETIC) 20-25 MG tablet TAKE 1 TABLET BY MOUTH DAILY 90 tablet 1 Past Week at Unknown time  . meloxicam (MOBIC) 15 MG tablet Take 1 tablet (15 mg total) by mouth daily. 90 tablet 1 Past Week at Unknown time  . omeprazole (PRILOSEC) 40 MG capsule Take 1 capsule (40 mg total) by mouth 2 (two) times daily. 180  capsule 1 Past Week at Unknown time  . furosemide (LASIX) 20 MG tablet TAKE 1 TABLET BY MOUTH ONCE DAILY AS NEEDED FOR FLUID RETENTION 30 tablet 3 Taking  . glucose blood (ONE TOUCH TEST STRIPS) test strip Use as instructed    Taking     No Known Allergies   Past Medical History:  Diagnosis Date  . Abdominal pain, epigastric   . Anxiety   . Back pain   . Diabetes mellitus, type 2 ([place])   . Diverticulosis of colon without hemorrhage   . Duodenitis with hemorrhage   . Edema   . Fluid retention   . GERD (gastroesophageal reflux disease)   . Heart murmur   . Hyperlipidemia   . Hypertension   . Leg edema   . Mitral regurgitation   . Obesity, unspecified   . Osteopenia   . Panic disorder without agoraphobia   . Peripheral neuropathy   . Personal history of malignant neoplasm of cervix uteri    CIN-3 1989, normal since, gets pap smears annually  . Personal history of other genital system and obstetric disorders(V13.29)   . Prediabetes   . Rash and other nonspecific skin eruption   . Reflux esophagitis   . Swallowing difficulty   . Unspecified hereditary and idiopathic peripheral neuropathy   . Unspecified sleep apnea    sleep study- [facility]  . Vitamin D deficiency     Review of systems:  Physical Exam    Heart and lungs: With and without rub or gallop, lungs are bilaterally clear.    HEENT: Normocephalic atraumatic eyes are anicteric    Other:    Pertinant exam for procedure: Soft nontender nondistended bowel sounds positive normoactive.    Planned proceedures: EGD, colonoscopy and indicated procedures. I have discussed the risks benefits and complications of procedures to include not limited to bleeding, infection, perforation and the risk of sedation and the patient wishes to proceed.    Lollie Sails, MD Gastroenterology 06/09/2018  10:48 AM

## 2018-06-09 NOTE — Anesthesia Preprocedure Evaluation (Signed)
Anesthesia Evaluation  Patient identified by MRN, date of birth, ID band Patient awake    Reviewed: Allergy & Precautions, H&P , NPO status , Patient's Chart, lab work & pertinent test results, reviewed documented beta blocker date and time   Airway Mallampati: II   Neck ROM: full    Dental  (+) Poor Dentition   Pulmonary neg pulmonary ROS, sleep apnea and Continuous Positive Airway Pressure Ventilation ,    Pulmonary exam normal        Cardiovascular Exercise Tolerance: Poor hypertension, On Medications negative cardio ROS Normal cardiovascular exam+ Valvular Problems/Murmurs  Rhythm:regular Rate:Normal     Neuro/Psych  Headaches, PSYCHIATRIC DISORDERS Anxiety  Neuromuscular disease negative neurological ROS  negative psych ROS   GI/Hepatic negative GI ROS, Neg liver ROS, GERD  ,  Endo/Other  negative endocrine ROSdiabetes, Well Controlled, Type 2, Oral Hypoglycemic Agents  Renal/GU negative Renal ROS  negative genitourinary   Musculoskeletal   Abdominal   Peds  Hematology negative hematology ROS (+)   Anesthesia Other Findings Past Medical History: No date: Abdominal pain, epigastric No date: Anxiety No date: Back pain No date: Diabetes mellitus, type 2 ([facility]) No date: Diverticulosis of colon without hemorrhage No date: Duodenitis with hemorrhage No date: Edema No date: Fluid retention No date: GERD (gastroesophageal reflux disease) No date: Heart murmur No date: Hyperlipidemia No date: Hypertension No date: Leg edema No date: Mitral regurgitation No date: Obesity, unspecified No date: Osteopenia No date: Panic disorder without agoraphobia No date: Peripheral neuropathy No date: Personal history of malignant neoplasm of cervix uteri     Comment:  CIN-3 1989, normal since, gets pap smears annually No date: Personal history of other genital system and obstetric  disorders(V13.29) No date:  Prediabetes No date: Rash and other nonspecific skin eruption No date: Reflux esophagitis No date: Swallowing difficulty No date: Unspecified hereditary and idiopathic peripheral neuropathy No date: Unspecified sleep apnea     Comment:  sleep study- [facility] No date: Vitamin D deficiency Past Surgical History: No date: ABDOMINAL HYSTERECTOMY No date: BREAST CYST ASPIRATION     Comment:  right , benign No date: DILATION AND CURETTAGE OF UTERUS 2001: RIGHT OOPHORECTOMY     Comment:  menopause, endometriosis s/p.  HRT  since- has not               tolerated previous attempts to wean BMI    Body Mass Index:  39.33 kg/m     Reproductive/Obstetrics negative OB ROS                             Anesthesia Physical Anesthesia Plan  ASA: III  Anesthesia Plan: General   Post-op Pain Management:    Induction:   PONV Risk Score and Plan:   Airway Management Planned:   Additional Equipment:   Intra-op Plan:   Post-operative Plan:   Informed Consent: I have reviewed the patients History and Physical, chart, labs and discussed the procedure including the risks, benefits and alternatives for the proposed anesthesia with the patient or authorized representative who has indicated his/her understanding and acceptance.   Dental Advisory Given  Plan Discussed with: CRNA  Anesthesia Plan Comments:         Anesthesia Quick Evaluation

## 2018-06-09 NOTE — Op Note (Signed)
[facility] Gastroenterology Patient Name: Sheri [name] Procedure Date: 06/09/2018 10:54 AM MRN: [id number] Account #: [id number] Date of Birth: [birth year] Admit Type: Outpatient Age: 62 Room: [facility] ENDO ROOM 3 Gender: Female Note Status: Finalized Procedure:            Upper GI endoscopy Indications:          Dysphagia, Follow-up of Barrett's esophagus Providers:            Christena Deem, MD Referring MD:         Duncan Dull, MD (Referring MD) Medicines:            Monitored Anesthesia Care Complications:        No immediate complications. Procedure:            Pre-Anesthesia Assessment:                       - ASA Grade Assessment: III - A patient with severe                        systemic disease.                       After obtaining informed consent, the endoscope was                        passed under direct vision. Throughout the procedure,                        the patient's blood pressure, pulse, and oxygen                        saturations were monitored continuously. The Endoscope                        was introduced through the mouth, and advanced to the                        third part of duodenum. The upper GI endoscopy was                        accomplished without difficulty. The patient tolerated                        the procedure well. Findings:      LA Grade C (one or more mucosal breaks continuous between tops of 2 or       more mucosal folds, less than 75% circumference) esophagitis with no       bleeding was found. Mucosa was biopsied with a cold forceps for       histology in a targeted manner and in 4 quadrants in the lower third of       the esophagus. One specimen bottle was sent to pathology.      The exam of the esophagus was otherwise normal.      Diffuse and patchy mild inflammation characterized by congestion       (edema), erosions and erythema was found in the gastric body. Biopsies       were taken with a cold  forceps for histology. Biopsies were taken with a       cold forceps for Helicobacter pylori testing.  A small sliding hiatal hernia was present.      The examined duodenum was normal. Impression:           - LA Grade C erosive esophagitis. Biopsied.                       - Erosive gastritis. Biopsied.                       - Small sliding hiatal hernia.                       - Normal examined duodenum. Recommendation:       - Use Protonix (pantoprazole) 40 mg PO BID daily.                       - Use sucralfate tablets 1 gram PO QID for 4 weeks. Procedure Code(s):    --- Professional ---                       [phone], Esophagogastroduodenoscopy, flexible, transoral;                        with biopsy, single or multiple Diagnosis Code(s):    --- Professional ---                       K20.8, Other esophagitis                       K29.60, Other gastritis without bleeding                       K44.9, Diaphragmatic hernia without obstruction or                        gangrene                       K22.70, Barrett's esophagus without dysplasia                       R13.10, Dysphagia, unspecified CPT copyright 2017 [employer]. All rights reserved. The codes documented in this report are preliminary and upon coder review may  be revised to meet current compliance requirements. Christena Deem, MD 06/09/2018 11:18:24 AM This report has been signed electronically. Number of Addenda: 0 Note Initiated On: 06/09/2018 10:54 AM      [facility]

## 2018-06-09 NOTE — Op Note (Signed)
[facility] Gastroenterology Patient Name: Sheri [name] Procedure Date: 06/09/2018 10:53 AM MRN: [id number] Account #: [id number] Date of Birth: 02/14/1956 Admit Type: Outpatient Age: 62 Room: [facility] ENDO ROOM 3 Gender: Female Note Status: Finalized Procedure:            Colonoscopy Indications:          Screening for colorectal malignant neoplasm Providers:            Christena Deem, MD Referring MD:         Duncan Dull, MD (Referring MD) Medicines:            Monitored Anesthesia Care Complications:        No immediate complications. Procedure:            Pre-Anesthesia Assessment:                       - ASA Grade Assessment: III - A patient with severe                        systemic disease.                       After obtaining informed consent, the colonoscope was                        passed under direct vision. Throughout the procedure,                        the patient's blood pressure, pulse, and oxygen                        saturations were monitored continuously. The                        Colonoscope was introduced through the anus and                        advanced to the the cecum, identified by appendiceal                        orifice and ileocecal valve. The colonoscopy was                        performed without difficulty. The patient tolerated the                        procedure well. The quality of the bowel preparation                        was good. Findings:      Multiple small-mouthed diverticula were found in the sigmoid colon and       descending colon.      The exam was otherwise without abnormality.      The retroflexed view of the distal rectum and anal verge was normal and       showed no anal or rectal abnormalities.      The digital rectal exam was normal. Impression:           - Diverticulosis in the sigmoid colon and in the  descending colon.                       - The examination was  otherwise normal.                       - The distal rectum and anal verge are normal on                        retroflexion view.                       - No specimens collected. Recommendation:       - Discharge patient to home. Procedure Code(s):    --- Professional ---                       [phone], Colonoscopy, flexible; diagnostic, including                        collection of specimen(s) by brushing or washing, when                        performed (separate procedure) Diagnosis Code(s):    --- Professional ---                       Z12.11, Encounter for screening for malignant neoplasm                        of colon                       K57.30, Diverticulosis of large intestine without                        perforation or abscess without bleeding CPT copyright 2017 American Medical Association. All rights reserved. The codes documented in this report are preliminary and upon coder review may  be revised to meet current compliance requirements. Christena Deem, MD 06/09/2018 11:45:20 AM This report has been signed electronically. Number of Addenda: 0 Note Initiated On: 06/09/2018 10:53 AM Scope Withdrawal Time: 0 hours 10 minutes 29 seconds  Total Procedure Duration: 0 hours 21 minutes 2 seconds       [facility]

## 2018-06-09 NOTE — Anesthesia Post-op Follow-up Note (Signed)
Anesthesia QCDR form completed.        

## 2018-06-09 NOTE — Transfer of Care (Signed)
Immediate Anesthesia Transfer of Care Note  Patient: Sheri [name]  Procedure(s) Performed: ESOPHAGOGASTRODUODENOSCOPY (EGD) (N/A ) COLONOSCOPY WITH PROPOFOL (N/A )  Patient Location: PACU  Anesthesia Type:General  Level of Consciousness: awake  Airway & Oxygen Therapy: Patient Spontanous Breathing  Post-op Assessment: Report given to RN  Post vital signs: stable  Last Vitals:  Vitals Value Taken Time  BP    Temp    Pulse 95 06/09/2018 11:49 AM  Resp 21 06/09/2018 11:49 AM  SpO2 99 % 06/09/2018 11:49 AM  Vitals shown include unvalidated device data.  Last Pain:  Vitals:   06/09/18 1020  TempSrc: Tympanic      Patients Stated Pain Goal: 0 (32/41/99 1444)  Complications: No apparent anesthesia complications

## 2018-06-11 ENCOUNTER — Encounter: Payer: Self-pay | Admitting: Gastroenterology

## 2018-06-12 NOTE — Anesthesia Postprocedure Evaluation (Signed)
Anesthesia Post Note  Patient: Sheri [name]  Procedure(s) Performed: ESOPHAGOGASTRODUODENOSCOPY (EGD) (N/A ) COLONOSCOPY WITH PROPOFOL (N/A )  Patient location during evaluation: PACU Anesthesia Type: General Level of consciousness: awake and alert Pain management: pain level controlled Vital Signs Assessment: post-procedure vital signs reviewed and stable Respiratory status: spontaneous breathing, nonlabored ventilation, respiratory function stable and patient connected to nasal cannula oxygen Cardiovascular status: blood pressure returned to baseline and stable Postop Assessment: no apparent nausea or vomiting Anesthetic complications: no     Last Vitals:  Vitals:   06/09/18 1220 06/09/18 1230  BP: (!) 107/58 95/63  Pulse: 83 81  Resp: 17 20  Temp:    SpO2: 100% 100%    Last Pain:  Vitals:   06/10/18 0924  TempSrc:   PainSc: 0-No pain                 Molli Barrows

## 2018-06-13 LAB — SURGICAL PATHOLOGY

## 2018-06-13 NOTE — Progress Notes (Signed)
Office: [phone]  /  Fax: [phone] Date: June 14, 2018 Time Seen: 11:00am Duration: 60 minutes Provider: Glennie Isle, PsyD Type of Session: Intake for Individual Therapy   Informed Consent: The provider's role was explained to Sheri [name]. The provider reviewed and discussed issues of confidentiality, privacy, and limits therein. Since the clinic is not a 24/7 crisis center, mental health emergency resources were shared and a handout was provided. Sheri [name] verbally acknowledged understanding, and agreed to use mental health emergency resources discussed if needed. In addition to written consent, verbal informed consent for psychological services was obtained from Sheri [name] prior to the initial intake interview. Moreover, Sheri [name] agreed information may be shared with other [facility] providers as needed for coordination of care. Written consent was also provided for this provider to coordinate care with other providers at [facility].   Chief Complaint: Sheri [name] was referred by Dr. Dennard Nip. Per the note for the initial visit with Dr. Dennard Nip on May 30, 2018, Sheri [name] reported experiencing the following: significant food cravings issues; snacking frequently in the evenings; skipping meals frequently; frequently drinking liquids with calories; frequently making poor food choices; having problems with excessive hunger; frequently eating larger portions than normal; binge eating behaviors; and struggling with emotional eating. Sheri [name]'s Food and Mood (modified PHQ-9) score was 26.  Sheri [name] reported eating when she is stressed or upset about things. 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 Sheri [name] reported she binge eats when she is by herself. She noted her last binge was the week before she started with the clinic. Sheri [name] indicated that during her last binge episode she consumed two  slices of pizza, a jar of pickles, a large bag of Cheetos, and four scoops of ice cream over the course of four to five hours.   Sheri [name] was asked to complete a questionnaire assessing various behaviors related to emotional eating. Sheri [name] endorsed the following: experience food cravings on a regular basis, eat certain foods when you are anxious, stressed, depressed, or your feelings are hurt, use food to help you cope with emotional situations, find food is comforting to you, overeat when you are angry or upset, overeat when you are worried about something, overeat frequently when you are bored or lonely, overeat when you are angry at someone just to show them they cannot control you and overeat when you are alone, but eat much less when you are with other people.  HPI: Per the note for the initial visit with Dr. Dennard Nip on May 30, 2018, Sheri [name] started gaining weight in the last 11 years and her heaviest weight ever was 234 pounds. During today's appointment, she denied a history of purging and engaging in other compensatory strategies. Sheri [name] noted she has never been diagnosed with an eating disorder.   Mental Status Examination: Sheri [name] arrived on time for the appointment. She presented as appropriately dressed and groomed. Sheri [name] appeared her stated age and demonstrated adequate orientation to time, place, person, and purpose of the appointment. She also demonstrated appropriate eye contact. No psychomotor abnormalities or behavioral peculiarities noted. Her mood was sad with congruent affect. Her thought processes were logical, linear, and goal-directed. No hallucinations, delusions, bizarre thinking or behavior reported or observed. Judgment, insight, and impulse control appeared to be grossly intact. There was no evidence of paraphasias (i.e., errors in speech,  gross mispronunciations, and word substitutions), repetition deficits, or disturbances in volume or prosody (i.e., rhythm and  intonation). There was no evidence of attention or memory impairments. Sheri [name] denied current suicidal and homicidal ideation, plan, and intent.   The Montreal Cognitive Assessment (MoCA) was administered. The MoCA assesses different cognitive domains: attention and concentration, executive functions, memory, language, visuoconstructional skills, conceptual thinking, calculations, and orientation. Sheri [name] received 25 out of 30 points possible on the MoCA. It is important to note that symptoms of anxiety and depression can impact scores on the MoCA. Based on Sheri [name]'s self-report and the scores on the PHQ-9 and the GAD-7, she appears to be experiencing both depression and anxiety. Thus, her results on the MoCA should be interpreted with caution. One point was lost on a visuospatial/executive task requiring Sheri [name] to replicate the visual stimuli. Two points were lost on the language task requiring her to state as many words as possible starting with a specific letter over the course of one minute. In addition, three points were lost on the delayed recall task as Sheri [name] was able to recall two out of five words after a short delay. With category cues, she was able to recall one additional word. Notably, an insertion was made for one of the words following a category cue. With multiple choice cues, Sheri [name] recalled one additional word and erroneously identified the last word from the choices provided.  Family & Psychosocial History: Sheri [name] reported she has been married for 40 years and indicated she does not have any children. She shared her mother (age 27) currently resides with her and she takes care of her. She indicated her friends no longer come over as much anymore because they feel they are "intruding on [her] mother." In addition, she stated she cares for her husband who reportedly has multiple medical conditions. 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 Her highest level of education is an associate's degree. Sheri [name] reported her social support system consists of her co-workers, maternal cousin, and her dog. She identifies with Christianity. She is not currently attending services regularly, but she watches services that are broadcast on television.   Medical History:  Past Medical History:  Diagnosis Date  . Abdominal pain, epigastric   . Anxiety   . Back pain   . Diabetes mellitus, type 2 ([place])   . Diverticulosis of colon without hemorrhage   . Duodenitis with hemorrhage   . Edema   . Fluid retention   . GERD (gastroesophageal reflux disease)   . Heart murmur   . Hyperlipidemia   . Hypertension   . Leg edema   . Mitral regurgitation   . Obesity, unspecified   . Osteopenia   . Panic disorder without agoraphobia   . Peripheral neuropathy   . Personal history of malignant neoplasm of cervix uteri    CIN-3 1989, normal since, gets pap smears annually  . Personal history of other genital system and obstetric disorders(V13.29)   . Prediabetes   . Rash and other nonspecific skin eruption   . Reflux esophagitis   . Swallowing difficulty   . Unspecified hereditary and idiopathic peripheral neuropathy   . Unspecified sleep apnea    sleep study- [facility]  . Vitamin D deficiency    Past Surgical History:  Procedure Laterality Date  . ABDOMINAL HYSTERECTOMY    . BREAST CYST ASPIRATION     right , benign  .  COLONOSCOPY WITH PROPOFOL N/A 06/09/2018   Procedure: COLONOSCOPY WITH PROPOFOL;  Surgeon: Lollie Sails, MD;  Location: [facility] ENDOSCOPY;  Service: Endoscopy;  Laterality: N/A;  . DILATION AND CURETTAGE OF UTERUS    . ESOPHAGOGASTRODUODENOSCOPY N/A 06/09/2018   Procedure: ESOPHAGOGASTRODUODENOSCOPY (EGD);  Surgeon: Lollie Sails, MD;  Location: [facility] ENDOSCOPY;  Service: Endoscopy;  Laterality: N/A;  . RIGHT OOPHORECTOMY  2001   menopause, endometriosis s/p.  HRT  since- has not tolerated previous attempts to wean    Current Outpatient Medications on File Prior to Visit  Medication Sig Dispense Refill  . ALPRAZolam (XANAX) 0.5 MG tablet TAKE ONE TABLET TWICE DAILY AS NEEDED FOR AXNITEY OR SLEEP 45 tablet 5  . amitriptyline (ELAVIL) 25 MG tablet Take 3 tablets (75 mg total) by mouth at bedtime. For neuropathy 270 tablet 1  . atorvastatin (LIPITOR) 20 MG tablet TAKE 1 TABLET BY MOUTH DAILY. 90 tablet 3  . benzonatate (TESSALON) 200 MG capsule Take 1 capsule (200 mg total) by mouth 3 (three) times daily as needed for cough. 20 capsule 0  . furosemide (LASIX) 20 MG tablet TAKE 1 TABLET BY MOUTH ONCE DAILY AS NEEDED FOR FLUID RETENTION 30 tablet 3  . glucose blood (ONE TOUCH TEST STRIPS) test strip Use as instructed     . lisinopril-hydrochlorothiazide (PRINZIDE,ZESTORETIC) 20-25 MG tablet TAKE 1 TABLET BY MOUTH DAILY 90 tablet 1  . meloxicam (MOBIC) 15 MG tablet Take 1 tablet (15 mg total) by mouth daily. 90 tablet 1  . omeprazole (PRILOSEC) 40 MG capsule Take 1 capsule (40 mg total) by mouth 2 (two) times daily. 180 capsule 1   No current facility-administered medications on file prior to visit.   Sheri [name] denied a history of head injuries and loss of consciousness.   Mental Health History: Evangelynn Lochridge indicated she first received therapeutic services through the [facility] program for a month "many years ago." She explained it was after her husband had an affair for six years. Sheri [name] denied ever being hospitalized for psychiatric reasons and has never met with a psychiatrist. Sheri [name] stated her primary care physician currently prescribes her Xanax. Kamdyn Covel is unaware of family history of mental health concerns. She denied a trauma history, including sexual, physical, and psychological abuse, as well as neglect.   Sheri [name] reported experiencing the following: anhedonia; fatigue; depressed mood; trouble falling and staying asleep; decreased self-esteem; trouble concentrating; and moving more slowly. She also described feeling  hopeless as she does not feel like she has any control over anything in her life. Regarding sleep, Camile Esters explained she was initially prescribed Ambien; however, she noted she was eating in the middle of the night with no recollections of eating. She was later prescribed Restoril, which she has stopped as it was not helpful. She indicated she has not informed her prescribing physician that she stopped the medication; therefore, it was recommended she inform the prescriber. She agreed. Regarding anxiety, Phil Corti described feeling nervous all the time and feels jumpy. She also described waiting for the next shoe to drop because she never knows what she is going to find when she gets home. Sheri [name] indicated she is always on edge and never in a calm state. She denied experiencing the following: mania; obsessions and compulsions; history of and current engagement in self-harm; hallucinations and delusions; substance use; and history of and current suicidal and homicidal ideation, plan, and intent.   Structured Assessment Results: The Patient Health Questionnaire-9 (PHQ-9) is  a self-report measure that assesses symptoms and severity of depression over the course of the last two weeks. Sheri [name] obtained a score of 13 suggesting moderate depression. Sheri [name] finds the endorsed symptoms to be very difficult. Depression screen PHQ 2/9 06/14/2018  Decreased Interest 1  Down, Depressed, Hopeless 1  PHQ - 2 Score 2  Altered sleeping 3  Tired, decreased energy 3  Change in appetite 0  Feeling bad or failure about yourself  1  Trouble concentrating 3  Moving slowly or fidgety/restless 1  Suicidal thoughts 0  PHQ-9 Score 13  Difficult doing work/chores -   The Generalized Anxiety Disorder-7 (GAD-7) is a brief self-report measure that assesses symptoms of anxiety over the course of the last two weeks. Sheri [name] obtained a score of 21 suggesting severe anxiety.  GAD 7 : Generalized Anxiety Score 06/14/2018  Nervous, Anxious,  on Edge 3  Control/stop worrying 3  Worry too much - different things 3  Trouble relaxing 3  Restless 3  Easily annoyed or irritable 3  Afraid - awful might happen 3  Total GAD 7 Score 21  Anxiety Difficulty Very difficult   Interventions: A chart review was conducted prior to the clinical intake interview. The MoCA, PHQ-9, and GAD-7 were administered and a clinical intake interview was completed. In addition, Wally Behan was asked to complete a Mood and Food questionnaire to assess various behaviors related to emotional eating. Throughout session, empathic reflections and validation was provided. Continuing treatment with this provider was discussed and a treatment goal was established. Psychoeducation regarding emotional versus physical hunger was provided. Sheri [name] was given a handout to utilize between now and the next appointment to increase awareness of hunger patterns and subsequent eating.   Provisional DSM-5 Diagnosis: 296.32 (F33.1) Major Depressive Disorder, Recurrent Episode, Moderate, With Anxious Distress   Plan: Channelle Bottger expressed understanding and agreement with the initial treatment plan of care. She appears able and willing to participate as evidenced by collaboration on a treatment goal, engagement in reciprocal conversation, and asking questions as needed for clarification. The next appointment will be scheduled in two weeks. The following treatment goal was established: decrease emotional eating. For the aforementioned goal, Jeanne Diefendorf can benefit from biweekly sessions that are brief in duration for approximately four to six sessions.

## 2018-06-14 ENCOUNTER — Ambulatory Visit (INDEPENDENT_AMBULATORY_CARE_PROVIDER_SITE_OTHER): Payer: 59 | Admitting: Psychology

## 2018-06-14 ENCOUNTER — Ambulatory Visit (INDEPENDENT_AMBULATORY_CARE_PROVIDER_SITE_OTHER): Payer: 59 | Admitting: Family Medicine

## 2018-06-14 VITALS — BP 108/74 | HR 95 | Temp 98.1°F | Ht 63.0 in | Wt 219.0 lb

## 2018-06-14 DIAGNOSIS — Z9189 Other specified personal risk factors, not elsewhere classified: Secondary | ICD-10-CM | POA: Diagnosis not present

## 2018-06-14 DIAGNOSIS — F331 Major depressive disorder, recurrent, moderate: Secondary | ICD-10-CM

## 2018-06-14 DIAGNOSIS — E559 Vitamin D deficiency, unspecified: Secondary | ICD-10-CM

## 2018-06-14 DIAGNOSIS — E119 Type 2 diabetes mellitus without complications: Secondary | ICD-10-CM

## 2018-06-14 DIAGNOSIS — Z6838 Body mass index (BMI) 38.0-38.9, adult: Secondary | ICD-10-CM

## 2018-06-14 MED ORDER — VITAMIN D (ERGOCALCIFEROL) 1.25 MG (50000 UNIT) PO CAPS: 50000.0000 [IU] | ORAL_CAPSULE | ORAL | 0 refills | Status: DC

## 2018-06-14 MED ORDER — METFORMIN HCL 500 MG PO TABS: 500.0000 mg | ORAL_TABLET | Freq: Every day | ORAL | 0 refills | Status: DC

## 2018-06-15 NOTE — Progress Notes (Signed)
Office: [phone]  /  Fax: [phone]   HPI:   Chief Complaint: OBESITY Sheri [name] is here to discuss her progress with her obesity treatment plan. She is on the  follow the Category 3 plan and is following her eating plan approximately 90% of the time. She states she is exercising 0 minutes 0 times per week. Sheri [name] did very well with weight loss on her Category 3 plan. She struggled to eat all her protein at night.   Her weight is 219 lb (99.3 kg) today and has had a weight loss of 8 pounds over a period of 2 weeks since her last visit. She has lost 8 lbs since starting treatment with [name].  Vitamin D deficiency Sheri [name] has a diagnosis of vitamin D deficiency. She is not currently taking vit D and denies nausea, vomiting or muscle weakness.  Diabetes II Sheri [name] has a diagnosis of diabetes type II. Sheri [name] states she is attempting to diet control. Her polyphagia has improved with diet and denies any hypoglycemic episodes. Last A1c was 6.4. She has been working on intensive lifestyle modifications including diet, exercise, and weight loss to help control her blood glucose levels.  At risk for cardiovascular disease Sheri [name] is at a higher than average risk for cardiovascular disease due to obesity. She currently denies any chest pain.  ALLERGIES: No Known Allergies  MEDICATIONS: Current Outpatient Medications on File Prior to Visit  Medication Sig Dispense Refill  . ALPRAZolam (XANAX) 0.5 MG tablet TAKE ONE TABLET TWICE DAILY AS NEEDED FOR AXNITEY OR SLEEP 45 tablet 5  . amitriptyline (ELAVIL) 25 MG tablet Take 3 tablets (75 mg total) by mouth at bedtime. For neuropathy 270 tablet 1  . atorvastatin (LIPITOR) 20 MG tablet TAKE 1 TABLET BY MOUTH DAILY. 90 tablet 3  . benzonatate (TESSALON) 200 MG capsule Take 1 capsule (200 mg total) by mouth 3 (three) times daily as needed for cough. 20 capsule 0  . furosemide (LASIX) 20 MG tablet TAKE 1 TABLET BY MOUTH ONCE DAILY AS NEEDED FOR FLUID RETENTION 30 tablet 3  .  glucose blood (ONE TOUCH TEST STRIPS) test strip Use as instructed     . lisinopril-hydrochlorothiazide (PRINZIDE,ZESTORETIC) 20-25 MG tablet TAKE 1 TABLET BY MOUTH DAILY 90 tablet 1  . meloxicam (MOBIC) 15 MG tablet Take 1 tablet (15 mg total) by mouth daily. 90 tablet 1  . pantoprazole (PROTONIX) 40 MG tablet Take 40 mg by mouth 2 (two) times daily.    . sucralfate (CARAFATE) 1 g tablet Take 1 g by mouth 4 (four) times daily -  with meals and at bedtime.     No current facility-administered medications on file prior to visit.     PAST MEDICAL HISTORY: Past Medical History:  Diagnosis Date  . Abdominal pain, epigastric   . Anxiety   . Back pain   . Diabetes mellitus, type 2 ([place])   . Diverticulosis of colon without hemorrhage   . Duodenitis with hemorrhage   . Edema   . Fluid retention   . GERD (gastroesophageal reflux disease)   . Heart murmur   . Hyperlipidemia   . Hypertension   . Leg edema   . Mitral regurgitation   . Obesity, unspecified   . Osteopenia   . Panic disorder without agoraphobia   . Peripheral neuropathy   . Personal history of malignant neoplasm of cervix uteri    CIN-3 1989, normal since, gets pap smears annually  . Personal history of other genital system and  obstetric disorders(V13.29)   . Prediabetes   . Rash and other nonspecific skin eruption   . Reflux esophagitis   . Swallowing difficulty   . Unspecified hereditary and idiopathic peripheral neuropathy   . Unspecified sleep apnea    sleep study- [facility]  . Vitamin D deficiency     PAST SURGICAL HISTORY: Past Surgical History:  Procedure Laterality Date  . ABDOMINAL HYSTERECTOMY    . BREAST CYST ASPIRATION     right , benign  . COLONOSCOPY WITH PROPOFOL N/A 06/09/2018   Procedure: COLONOSCOPY WITH PROPOFOL;  Surgeon: Lollie Sails, MD;  Location: [facility] ENDOSCOPY;  Service: Endoscopy;  Laterality: N/A;  . DILATION AND CURETTAGE OF UTERUS    . ESOPHAGOGASTRODUODENOSCOPY N/A 06/09/2018    Procedure: ESOPHAGOGASTRODUODENOSCOPY (EGD);  Surgeon: Lollie Sails, MD;  Location: [facility] ENDOSCOPY;  Service: Endoscopy;  Laterality: N/A;  . RIGHT OOPHORECTOMY  2001   menopause, endometriosis s/p.  HRT  since- has not tolerated previous attempts to wean    SOCIAL HISTORY: Social History   Tobacco Use  . Smoking status: Never Smoker  . Smokeless tobacco: Never Used  Substance Use Topics  . Alcohol use: Yes    Alcohol/week: 0.0 standard drinks    Comment: rare  . Drug use: No    FAMILY HISTORY: Family History  Problem Relation Age of Onset  . Osteopenia Mother   . Hypothyroidism Mother   . Diabetes Mother   . Hyperlipidemia Mother   . Hypertension Mother   . Obesity Mother   . Diabetes Father   . Alzheimer's disease Father   . Coronary artery disease Father   . Obesity Father   . Cancer Neg Hx        breast, ovarian, colon    ROS: Review of Systems  Constitutional: Positive for weight loss.  Gastrointestinal: Negative for nausea and vomiting.  Musculoskeletal:       Negative for muscle weakness  Endo/Heme/Allergies:       Negative for hypoglycemia    PHYSICAL EXAM: Blood pressure 108/74, pulse 95, temperature 98.1 F (36.7 C), temperature source Oral, height 5\' 3"  (1.6 m), weight 219 lb (99.3 kg), SpO2 99 %. Body mass index is 38.79 kg/m. Physical Exam  Constitutional: She is oriented to person, place, and time. She appears well-developed and well-nourished.  HENT:  Head: Normocephalic.  Cardiovascular: Normal rate.  Pulmonary/Chest: Effort normal.  Musculoskeletal: Normal range of motion.  Neurological: She is alert and oriented to person, place, and time.  Skin: Skin is warm and dry.  Psychiatric: She has a normal mood and affect. Her behavior is normal.  Nursing note and vitals reviewed.   RECENT LABS AND TESTS: BMET    Component Value Date/Time   NA 137 05/30/2018 1248   NA 139 04/18/2012 0931   K 4.3 05/30/2018 1248   K 4.1 04/18/2012  0931   CL 100 05/30/2018 1248   CL 106 04/18/2012 0931   CO2 20 05/30/2018 1248   CO2 23 04/18/2012 0931   GLUCOSE 112 (H) 05/30/2018 1248   GLUCOSE 160 (H) 02/15/2018 0905   GLUCOSE 105 (H) 04/18/2012 0931   BUN 19 05/30/2018 1248   BUN 13 04/18/2012 0931   CREATININE 0.71 05/30/2018 1248   CREATININE 0.63 04/18/2012 0931   CALCIUM 9.5 05/30/2018 1248   CALCIUM 8.9 04/18/2012 0931   GFRNONAA 92 05/30/2018 1248   GFRNONAA >60 04/18/2012 0931   GFRAA 106 05/30/2018 1248   GFRAA >60 04/18/2012 0931   Lab  Results  Component Value Date   HGBA1C 6.4 (H) 05/30/2018   HGBA1C 6.6 (H) 02/15/2018   HGBA1C 6.0 08/10/2017   HGBA1C 6.1 02/11/2017   HGBA1C 5.8 11/12/2016   Lab Results  Component Value Date   INSULIN 20.8 05/30/2018   CBC    Component Value Date/Time   WBC 6.6 05/30/2018 1248   WBC 5.1 05/11/2013 0928   RBC 5.00 05/30/2018 1248   RBC 4.62 05/11/2013 0928   HGB 14.4 05/30/2018 1248   HCT 43.8 05/30/2018 1248   PLT 301.0 05/11/2013 0928   MCV 88 05/30/2018 1248   MCH 28.8 05/30/2018 1248   MCHC 32.9 05/30/2018 1248   MCHC 34.3 05/11/2013 0928   RDW 11.9 (L) 05/30/2018 1248   LYMPHSABS 2.2 05/30/2018 1248   MONOABS 0.5 05/11/2013 0928   EOSABS 0.1 05/30/2018 1248   BASOSABS 0.0 05/30/2018 1248   Iron/TIBC/Ferritin/ %Sat No results found for: IRON, TIBC, FERRITIN, IRONPCTSAT Lipid Panel     Component Value Date/Time   CHOL 165 05/30/2018 1248   TRIG 234 (H) 05/30/2018 1248   HDL 40 05/30/2018 1248   CHOLHDL 4 02/15/2018 0905   VLDL 37.0 02/15/2018 0905   LDLCALC 78 05/30/2018 1248   LDLDIRECT 97.0 08/11/2016 0853   Hepatic Function Panel     Component Value Date/Time   PROT 7.3 05/30/2018 1248   PROT 7.5 04/18/2012 0931   ALBUMIN 4.6 05/30/2018 1248   ALBUMIN 4.0 04/18/2012 0931   AST 22 05/30/2018 1248   AST 26 04/18/2012 0931   ALT 30 05/30/2018 1248   ALT 26 04/18/2012 0931   ALKPHOS 88 05/30/2018 1248   ALKPHOS 55 04/18/2012 0931    BILITOT 0.8 05/30/2018 1248   BILITOT 0.8 04/18/2012 0931      Component Value Date/Time   TSH 2.940 05/30/2018 1248   TSH 1.02 05/11/2013 0928     Ref. Range 05/30/2018 12:48  Vitamin D, 25-Hydroxy Latest Ref Range: 30.0 - 100.0 ng/mL 26.6 (L)   ASSESSMENT AND PLAN: Vitamin D deficiency - Plan: Vitamin D, Ergocalciferol, (DRISDOL) 50000 units CAPS capsule  Type 2 diabetes mellitus without complication, without long-term current use of insulin (HCC) - Plan: metFORMIN (GLUCOPHAGE) 500 MG tablet  At risk for heart disease  Class 2 severe obesity with serious comorbidity and body mass index (BMI) of 38.0 to 38.9 in adult, unspecified obesity type ([place])  PLAN: Vitamin D Deficiency Sheri [name] was informed that low vitamin D levels contributes to fatigue and are associated with obesity, breast, and colon cancer. She agrees to start prescription Vit D @50 ,000 IU every week #4 with no refills and will follow up for routine testing of vitamin D, at least 2-3 times per year. She was informed of the risk of over-replacement of vitamin D and agrees to not increase her dose unless she discusses this with [name] first. Agrees to follow up with our clinic as directed.   Diabetes II Sheri [name] has been given extensive diabetes education by myself today including ideal fasting and post-prandial blood glucose readings, individual ideal HgA1c goals  and hypoglycemia prevention. We discussed the importance of good blood sugar control to decrease the likelihood of diabetic complications such as nephropathy, neuropathy, limb loss, blindness, coronary artery disease, and death. We discussed the importance of intensive lifestyle modification including diet, exercise and weight loss as the first line treatment for diabetes. Sheri [name] agrees to start Metformin 500 mg daily #30 with no refills and will follow up at the agreed upon time.  Cardiovascular risk counseling Sheri [name] was given extended (15 minutes) coronary artery disease prevention  counseling today. She is 62 y.o. female and has risk factors for heart disease including obesity. We discussed intensive lifestyle modifications today with an emphasis on specific weight loss instructions and strategies. Pt was also informed of the importance of increasing exercise and decreasing saturated fats to help prevent heart disease.  Obesity Sheri [name] is currently in the action stage of change. As such, her goal is to continue with weight loss efforts She has agreed to follow the Category 3 plan Sheri [name] has been instructed to work up to a goal of 150 minutes of combined cardio and strengthening exercise per week for weight loss and overall health benefits. We discussed the following Behavioral Modification Strategies today: increasing lean protein intake, decreasing simple carbohydrates  and work on meal planning and easy cooking plans  Sheri [name] has agreed to follow up with our clinic in 2 weeks. She was informed of the importance of frequent follow up visits to maximize her success with intensive lifestyle modifications for her multiple health conditions.   OBESITY BEHAVIORAL INTERVENTION VISIT  Today's visit was # 2   Starting weight: 227 lb Starting date: 05/30/18 Today's weight : 219 lb Today's date: 06/14/18 Total lbs lost to date: 8 lb At least 15 minutes were spent on discussing the following behavioral intervention visit.   ASK: We discussed the diagnosis of obesity with Sheri [name] today and Sheri [name] agreed to give [name] permission to discuss obesity behavioral modification therapy today.  ASSESS: Sheri [name] has the diagnosis of obesity and her BMI today is 38.8 Sheri [name] is in the action stage of change   ADVISE: Sheri [name] was educated on the multiple health risks of obesity as well as the benefit of weight loss to improve her health. She was advised of the need for long term treatment and the importance of lifestyle modifications to improve her current health and to decrease her risk of future health  problems.  AGREE: Multiple dietary modification options and treatment options were discussed and  Sheri [name] agreed to follow the recommendations documented in the above note.  ARRANGE: Sheri [name] was educated on the importance of frequent visits to treat obesity as outlined per CMS and USPSTF guidelines and agreed to schedule her next follow up appointment today.  I, Renee Ramus, am acting as transcriptionist for Dennard Nip, MD   I have reviewed the above documentation for accuracy and completeness, and I agree with the above. -Dennard Nip, MD

## 2018-06-23 DIAGNOSIS — R1310 Dysphagia, unspecified: Secondary | ICD-10-CM | POA: Diagnosis not present

## 2018-06-23 DIAGNOSIS — K219 Gastro-esophageal reflux disease without esophagitis: Secondary | ICD-10-CM | POA: Diagnosis not present

## 2018-06-23 DIAGNOSIS — R490 Dysphonia: Secondary | ICD-10-CM | POA: Diagnosis not present

## 2018-06-23 DIAGNOSIS — R05 Cough: Secondary | ICD-10-CM | POA: Diagnosis not present

## 2018-06-23 DIAGNOSIS — F458 Other somatoform disorders: Secondary | ICD-10-CM | POA: Diagnosis not present

## 2018-06-28 NOTE — Progress Notes (Signed)
Office: [phone]  /  Fax: [phone]   Date: June 29, 2018 Time Seen: 4:00pm Duration: 35 minutes Provider: Glennie Isle, Psy.D. Type of Session: Individual Therapy   HPI: Sheri [name] was referred by Dr. Dennard Nip and was seen for an initial appointment by this provider on June 14, 2018. Per the note for the initial visit with Dr. Dennard Nip on May 30, 2018, Sheri [name] reported experiencing the following: significant food cravings issues; snacking frequently in the evenings; skipping meals frequently; frequently drinking liquids with calories; frequently making poor food choices; having problems with excessive hunger;frequently eating larger portions than normal; binge eating behaviors; and struggling with emotional eating. Sheri [name]'s Food and Mood (modified PHQ-9) score was26. In addition, Sheri Annstarted gaining weight in the last 11 years and herheaviest weight ever was 234 pounds. During the initial appointment with this provider, Sheri [name] reported eating when she is stressed or upset about things. 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 Sheri [name] reported she binge eats when she is by herself. She noted her last binge was the week before she started with the clinic. Sheri [name] indicated that during her last binge episode she consumed two slices of pizza, a jar of pickles, a large bag of Cheetos, and four scoops of ice cream over the course of four to five hours. Moreover, she denied a history of purging and engaging in other compensatory strategies. Sheri [name] noted she has never been diagnosed with an eating disorder. Furthermore, Sheri [name] was asked to complete a questionnaire assessing various behaviors related to emotional eating. Sheri [name] endorsed the following: experience food cravings on a regular basis, eat certain foods when you are anxious, stressed, depressed, or your feelings are hurt, use food to help you cope with emotional  situations, find food is comforting to you, overeat when you are angry or upset, overeat when you are worried about something, overeat frequently when you are bored or lonely, overeat when you are angry at someone just to show them they cannot control you and overeat when you are alone, but eat much less when you are with other people. During today's appointment, Sheri [name] did not report any emotional eating episodes.  Session Content: Session focused on the following treatment goal: decrease emotional eating. The session was initiated with the administration of the PHQ-9 and GAD-7, as well as a brief check-in. Sheri [name] indicated, "I feel so much better" and "Food isn't my focus anymore." She added, "I had tremendous support from work." In addition, Sheri [name] discussed being assertive with her husband. Regarding emotional and physical hunger, Sheri [name] noted experiencing stress at work, and indicated that instead of engaging in emotional eating, she went for a walk. Psychoeducation regarding triggers for emotional eating was provided. Sheri [name] was provided a handout, and encouraged to utilize the handout between now and the next appointment to increase awareness of triggers and frequency. Sheri [name] agreed. Overall, Sheri [name] was receptive to today's session as evidenced by her openness to sharing, responsiveness to feedback, and willingness to identify her triggers for emotional eating.  Mental Status Examination: Sheri [name] arrived on time for the appointment. She presented as appropriately dressed and groomed. Sheri [name] appeared her stated age and demonstrated adequate orientation to time, place, person, and purpose of the appointment. She also demonstrated appropriate eye contact. No psychomotor abnormalities or behavioral peculiarities noted. Her mood was euthymic with congruent affect. Her thought  processes were logical, linear, and goal-directed. No hallucinations, delusions, bizarre thinking or behavior reported or observed.  Judgment, insight, and impulse control appeared to be grossly intact. There was no evidence of paraphasias (i.e., errors in speech, gross mispronunciations, and word substitutions), repetition deficits, or disturbances in volume or prosody (i.e., rhythm and intonation). There was no evidence of attention or memory impairments. Sheri [name] denied current suicidal and homicidal ideation, intent or plan.  Structured Assessment Results: The Patient Health Questionnaire-9 (PHQ-9) is a self-report measure that assesses symptoms and severity of depression over the course of the last two weeks. Sheri [name] obtained a score of one suggesting minimal depression. Sheri [name] finds the endorsed symptoms to be not difficult at all. Depression screen PHQ 2/9 06/29/2018  Decreased Interest 0  Down, Depressed, Hopeless 0  PHQ - 2 Score 0  Altered sleeping 1  Tired, decreased energy 0  Change in appetite 0  Feeling bad or failure about yourself  0  Trouble concentrating 0  Moving slowly or fidgety/restless 0  Suicidal thoughts 0  PHQ-9 Score 1  Difficult doing work/chores -   The Generalized Anxiety Disorder-7 (GAD-7) is a brief self-report measure that assesses symptoms of anxiety over the course of the last two weeks. Sheri [name] obtained a score of five suggesting mild anxiety. GAD 7 : Generalized Anxiety Score 06/29/2018  Nervous, Anxious, on Edge 1  Control/stop worrying 1  Worry too much - different things 1  Trouble relaxing 1  Restless 0  Easily annoyed or irritable 1  Afraid - awful might happen 0  Total GAD 7 Score 5  Anxiety Difficulty Not difficult at all   Interventions: Sheri [name] was administered the PHQ-9 and GAD-7 for symptom monitoring. Content (I.e., physical versus emotional hunger) from the last session was reviewed. Throughout today's session, empathic reflections and validation were provided. Psychoeducation regarding triggers for emotional eating was provided.  A handout was given to Sheri [name].   DSM-5  Diagnosis: 296.32 (F33.1) Major Depressive Disorder, Recurrent Episode, Moderate, With Anxious Distress   Treatment Goal & Progress: Sheri [name] was seen for an initial appointment with this provider on June 14, 2018 during which the following treatment goal was established: decrease emotional eating. Sheri [name] has demonstrated progress in her goal of decreasing emotional eating as evidenced by her increased awareness of hunger patterns and willingness to identify her triggers for emotional eating. Also, per self-report, there have not been any emotional eating episodes since the last appointment.  Plan: Sheri [name] continues to appear able and willing to participate as evidenced by engagement in reciprocal conversation, and asking questions for clarification as appropriate. The next appointment will be scheduled in two weeks. The next session will focus on reviewing triggers for emotional eating.

## 2018-06-29 ENCOUNTER — Ambulatory Visit (INDEPENDENT_AMBULATORY_CARE_PROVIDER_SITE_OTHER): Payer: 59 | Admitting: Psychology

## 2018-06-29 ENCOUNTER — Ambulatory Visit (INDEPENDENT_AMBULATORY_CARE_PROVIDER_SITE_OTHER): Payer: 59 | Admitting: Family Medicine

## 2018-06-29 VITALS — BP 110/74 | HR 114 | Temp 97.7°F | Ht 63.0 in | Wt 213.0 lb

## 2018-06-29 DIAGNOSIS — Z6837 Body mass index (BMI) 37.0-37.9, adult: Secondary | ICD-10-CM

## 2018-06-29 DIAGNOSIS — E7849 Other hyperlipidemia: Secondary | ICD-10-CM

## 2018-06-29 DIAGNOSIS — F331 Major depressive disorder, recurrent, moderate: Secondary | ICD-10-CM | POA: Diagnosis not present

## 2018-07-03 NOTE — Progress Notes (Signed)
Office: [phone]  /  Fax: [phone]   HPI:   Chief Complaint: OBESITY Sheri [name] is here to discuss her progress with her obesity treatment plan. She is on the Category 3 plan and is following her eating plan approximately 100 % of the time. She states she is walking for 15 minutes 2 times per week. Sheri [name] continues to do well with weight loss and is doing well with meal planning, and getting herself up for success. She is walking for exercise and thinking about starting at the gym.  Her weight is 213 lb (96.6 kg) today and has had a weight loss of 6 pounds over a period of 2 weeks since her last visit. She has lost 14 lbs since starting treatment with [name].  Hyperlipidemia Sheri [name] has hyperlipidemia and has been attempting to improve her cholesterol levels with intensive lifestyle modification including a low saturated fat diet, exercise and weight loss in addition to Lipitor. She denies any chest pain, claudication or myalgias.  ALLERGIES: No Known Allergies  MEDICATIONS: Current Outpatient Medications on File Prior to Visit  Medication Sig Dispense Refill  . ALPRAZolam (XANAX) 0.5 MG tablet TAKE ONE TABLET TWICE DAILY AS NEEDED FOR AXNITEY OR SLEEP 45 tablet 5  . amitriptyline (ELAVIL) 25 MG tablet Take 3 tablets (75 mg total) by mouth at bedtime. For neuropathy 270 tablet 1  . atorvastatin (LIPITOR) 20 MG tablet TAKE 1 TABLET BY MOUTH DAILY. 90 tablet 3  . benzonatate (TESSALON) 200 MG capsule Take 1 capsule (200 mg total) by mouth 3 (three) times daily as needed for cough. 20 capsule 0  . furosemide (LASIX) 20 MG tablet TAKE 1 TABLET BY MOUTH ONCE DAILY AS NEEDED FOR FLUID RETENTION 30 tablet 3  . glucose blood (ONE TOUCH TEST STRIPS) test strip Use as instructed     . lisinopril-hydrochlorothiazide (PRINZIDE,ZESTORETIC) 20-25 MG tablet TAKE 1 TABLET BY MOUTH DAILY 90 tablet 1  . meloxicam (MOBIC) 15 MG tablet Take 1 tablet (15 mg total) by mouth daily. 90 tablet 1  . metFORMIN (GLUCOPHAGE)  500 MG tablet Take 1 tablet (500 mg total) by mouth daily with breakfast. 30 tablet 0  . pantoprazole (PROTONIX) 40 MG tablet Take 40 mg by mouth 2 (two) times daily.    . sucralfate (CARAFATE) 1 g tablet Take 1 g by mouth 4 (four) times daily -  with meals and at bedtime.    . Vitamin D, Ergocalciferol, (DRISDOL) 50000 units CAPS capsule Take 1 capsule (50,000 Units total) by mouth every 7 (seven) days. 4 capsule 0   No current facility-administered medications on file prior to visit.     PAST MEDICAL HISTORY: Past Medical History:  Diagnosis Date  . Abdominal pain, epigastric   . Anxiety   . Back pain   . Diabetes mellitus, type 2 ([place])   . Diverticulosis of colon without hemorrhage   . Duodenitis with hemorrhage   . Edema   . Fluid retention   . GERD (gastroesophageal reflux disease)   . Heart murmur   . Hyperlipidemia   . Hypertension   . Leg edema   . Mitral regurgitation   . Obesity, unspecified   . Osteopenia   . Panic disorder without agoraphobia   . Peripheral neuropathy   . Personal history of malignant neoplasm of cervix uteri    CIN-3 1989, normal since, gets pap smears annually  . Personal history of other genital system and obstetric disorders(V13.29)   . Prediabetes   . Rash and  other nonspecific skin eruption   . Reflux esophagitis   . Swallowing difficulty   . Unspecified hereditary and idiopathic peripheral neuropathy   . Unspecified sleep apnea    sleep study- [facility]  . Vitamin D deficiency     PAST SURGICAL HISTORY: Past Surgical History:  Procedure Laterality Date  . ABDOMINAL HYSTERECTOMY    . BREAST CYST ASPIRATION     right , benign  . COLONOSCOPY WITH PROPOFOL N/A 06/09/2018   Procedure: COLONOSCOPY WITH PROPOFOL;  Surgeon: Lollie Sails, MD;  Location: [facility] ENDOSCOPY;  Service: Endoscopy;  Laterality: N/A;  . DILATION AND CURETTAGE OF UTERUS    . ESOPHAGOGASTRODUODENOSCOPY N/A 06/09/2018   Procedure: ESOPHAGOGASTRODUODENOSCOPY (EGD);   Surgeon: Lollie Sails, MD;  Location: [facility] ENDOSCOPY;  Service: Endoscopy;  Laterality: N/A;  . RIGHT OOPHORECTOMY  2001   menopause, endometriosis s/p.  HRT  since- has not tolerated previous attempts to wean    SOCIAL HISTORY: Social History   Tobacco Use  . Smoking status: Never Smoker  . Smokeless tobacco: Never Used  Substance Use Topics  . Alcohol use: Yes    Alcohol/week: 0.0 standard drinks    Comment: rare  . Drug use: No    FAMILY HISTORY: Family History  Problem Relation Age of Onset  . Osteopenia Mother   . Hypothyroidism Mother   . Diabetes Mother   . Hyperlipidemia Mother   . Hypertension Mother   . Obesity Mother   . Diabetes Father   . Alzheimer's disease Father   . Coronary artery disease Father   . Obesity Father   . Cancer Neg Hx        breast, ovarian, colon    ROS: Review of Systems  Constitutional: Positive for weight loss.  Cardiovascular: Negative for chest pain and claudication.  Musculoskeletal: Negative for myalgias.    PHYSICAL EXAM: Blood pressure 110/74, pulse (!) 114, temperature 97.7 F (36.5 C), temperature source Oral, height 5\' 3"  (1.6 m), weight 213 lb (96.6 kg), SpO2 97 %. Body mass index is 37.73 kg/m. Physical Exam  Constitutional: She is oriented to person, place, and time. She appears well-developed and well-nourished.  Cardiovascular: Normal rate.  Pulmonary/Chest: Effort normal.  Musculoskeletal: Normal range of motion.  Neurological: She is oriented to person, place, and time.  Skin: Skin is warm and dry.  Psychiatric: She has a normal mood and affect. Her behavior is normal.  Vitals reviewed.   RECENT LABS AND TESTS: BMET    Component Value Date/Time   NA 137 05/30/2018 1248   NA 139 04/18/2012 0931   K 4.3 05/30/2018 1248   K 4.1 04/18/2012 0931   CL 100 05/30/2018 1248   CL 106 04/18/2012 0931   CO2 20 05/30/2018 1248   CO2 23 04/18/2012 0931   GLUCOSE 112 (H) 05/30/2018 1248   GLUCOSE 160 (H)  02/15/2018 0905   GLUCOSE 105 (H) 04/18/2012 0931   BUN 19 05/30/2018 1248   BUN 13 04/18/2012 0931   CREATININE 0.71 05/30/2018 1248   CREATININE 0.63 04/18/2012 0931   CALCIUM 9.5 05/30/2018 1248   CALCIUM 8.9 04/18/2012 0931   GFRNONAA 92 05/30/2018 1248   GFRNONAA >60 04/18/2012 0931   GFRAA 106 05/30/2018 1248   GFRAA >60 04/18/2012 0931   Lab Results  Component Value Date   HGBA1C 6.4 (H) 05/30/2018   HGBA1C 6.6 (H) 02/15/2018   HGBA1C 6.0 08/10/2017   HGBA1C 6.1 02/11/2017   HGBA1C 5.8 11/12/2016   Lab Results  Component Value Date   INSULIN 20.8 05/30/2018   CBC    Component Value Date/Time   WBC 6.6 05/30/2018 1248   WBC 5.1 05/11/2013 0928   RBC 5.00 05/30/2018 1248   RBC 4.62 05/11/2013 0928   HGB 14.4 05/30/2018 1248   HCT 43.8 05/30/2018 1248   PLT 301.0 05/11/2013 0928   MCV 88 05/30/2018 1248   MCH 28.8 05/30/2018 1248   MCHC 32.9 05/30/2018 1248   MCHC 34.3 05/11/2013 0928   RDW 11.9 (L) 05/30/2018 1248   LYMPHSABS 2.2 05/30/2018 1248   MONOABS 0.5 05/11/2013 0928   EOSABS 0.1 05/30/2018 1248   BASOSABS 0.0 05/30/2018 1248   Iron/TIBC/Ferritin/ %Sat No results found for: IRON, TIBC, FERRITIN, IRONPCTSAT Lipid Panel     Component Value Date/Time   CHOL 165 05/30/2018 1248   TRIG 234 (H) 05/30/2018 1248   HDL 40 05/30/2018 1248   CHOLHDL 4 02/15/2018 0905   VLDL 37.0 02/15/2018 0905   LDLCALC 78 05/30/2018 1248   LDLDIRECT 97.0 08/11/2016 0853   Hepatic Function Panel     Component Value Date/Time   PROT 7.3 05/30/2018 1248   PROT 7.5 04/18/2012 0931   ALBUMIN 4.6 05/30/2018 1248   ALBUMIN 4.0 04/18/2012 0931   AST 22 05/30/2018 1248   AST 26 04/18/2012 0931   ALT 30 05/30/2018 1248   ALT 26 04/18/2012 0931   ALKPHOS 88 05/30/2018 1248   ALKPHOS 55 04/18/2012 0931   BILITOT 0.8 05/30/2018 1248   BILITOT 0.8 04/18/2012 0931      Component Value Date/Time   TSH 2.940 05/30/2018 1248   TSH 1.02 05/11/2013 0928    ASSESSMENT  AND PLAN: Other hyperlipidemia  Class 2 severe obesity with serious comorbidity and body mass index (BMI) of 37.0 to 37.9 in adult, unspecified obesity type (HCC)  PLAN:  Hyperlipidemia Carrye was informed of the American Heart Association Guidelines emphasizing intensive lifestyle modifications as the first line treatment for hyperlipidemia. We discussed many lifestyle modifications today in depth, and Demaris will continue to work on decreasing saturated fats such as fatty red meat, butter and many fried foods. Caelan agrees to continue Lipitor and she will also increase vegetables and lean protein in her diet and continue to work on diet, exercise, and weight loss efforts. We will recheck labs in 2 months. Marisal agrees to follow up with our clinic in 2 weeks.  I spent > than 50% of the 15 minute visit on counseling as documented in the note.  Obesity Angelica is currently in the action stage of change. As such, her goal is to continue with weight loss efforts She has agreed to follow the Category 3 plan Jasma has been instructed to work up to a goal of 150 minutes of combined cardio and strengthening exercise per week for weight loss and overall health benefits. We discussed the following Behavioral Modification Strategies today: increasing lean protein intake and decreasing simple carbohydrates    Trenese has agreed to follow up with our clinic in 2 weeks. She was informed of the importance of frequent follow up visits to maximize her success with intensive lifestyle modifications for her multiple health conditions.   OBESITY BEHAVIORAL INTERVENTION VISIT  Today's visit was # 3   Starting weight: 227 lbs Starting date: 05/30/18 Today's weight : 213 lbs Today's date: 06/29/2018 Total lbs lost to date: 61    ASK: We discussed the diagnosis of obesity with Aleen Sells [place] today and Izola agreed to give [name]  permission to discuss obesity behavioral modification therapy today.  ASSESS: Maximina has the diagnosis of  obesity and her BMI today is 37.74 Nikya is in the action stage of change   ADVISE: Dazhane was educated on the multiple health risks of obesity as well as the benefit of weight loss to improve her health. She was advised of the need for long term treatment and the importance of lifestyle modifications to improve her current health and to decrease her risk of future health problems.  AGREE: Multiple dietary modification options and treatment options were discussed and  Ellissa agreed to follow the recommendations documented in the above note.  ARRANGE: Pegi was educated on the importance of frequent visits to treat obesity as outlined per CMS and USPSTF guidelines and agreed to schedule her next follow up appointment today.  I, Trixie Dredge, am acting as transcriptionist for Dennard Nip, MD  I have reviewed the above documentation for accuracy and completeness, and I agree with the above. -Dennard Nip, MD

## 2018-07-05 ENCOUNTER — Other Ambulatory Visit: Payer: Self-pay | Admitting: Internal Medicine

## 2018-07-11 DIAGNOSIS — K210 Gastro-esophageal reflux disease with esophagitis: Secondary | ICD-10-CM | POA: Diagnosis not present

## 2018-07-11 DIAGNOSIS — K5790 Diverticulosis of intestine, part unspecified, without perforation or abscess without bleeding: Secondary | ICD-10-CM | POA: Diagnosis not present

## 2018-07-11 DIAGNOSIS — K2270 Barrett's esophagus without dysplasia: Secondary | ICD-10-CM | POA: Diagnosis not present

## 2018-07-12 ENCOUNTER — Ambulatory Visit
Admission: RE | Admit: 2018-07-12 | Discharge: 2018-07-12 | Disposition: A | Discharge status: Home / Self Care | Payer: 59 | Source: Ambulatory Visit | Attending: Internal Medicine | Admitting: Internal Medicine

## 2018-07-12 DIAGNOSIS — Z1231 Encounter for screening mammogram for malignant neoplasm of breast: Secondary | ICD-10-CM | POA: Diagnosis not present

## 2018-07-13 ENCOUNTER — Ambulatory Visit (INDEPENDENT_AMBULATORY_CARE_PROVIDER_SITE_OTHER): Payer: 59 | Admitting: Family Medicine

## 2018-07-13 VITALS — BP 129/80 | HR 119 | Ht 63.0 in | Wt 211.0 lb

## 2018-07-13 DIAGNOSIS — Z6837 Body mass index (BMI) 37.0-37.9, adult: Secondary | ICD-10-CM | POA: Diagnosis not present

## 2018-07-13 DIAGNOSIS — Z9189 Other specified personal risk factors, not elsewhere classified: Secondary | ICD-10-CM

## 2018-07-13 DIAGNOSIS — E559 Vitamin D deficiency, unspecified: Secondary | ICD-10-CM | POA: Diagnosis not present

## 2018-07-13 DIAGNOSIS — E119 Type 2 diabetes mellitus without complications: Secondary | ICD-10-CM

## 2018-07-13 MED ORDER — METFORMIN HCL 500 MG PO TABS: 500.0000 mg | ORAL_TABLET | Freq: Every day | ORAL | 0 refills | Status: DC

## 2018-07-13 MED ORDER — VITAMIN D (ERGOCALCIFEROL) 1.25 MG (50000 UNIT) PO CAPS: 50000.0000 [IU] | ORAL_CAPSULE | ORAL | 0 refills | Status: DC

## 2018-07-17 ENCOUNTER — Ambulatory Visit (INDEPENDENT_AMBULATORY_CARE_PROVIDER_SITE_OTHER): Payer: Self-pay | Admitting: Psychology

## 2018-07-17 NOTE — Progress Notes (Signed)
Office: [phone]  /  Fax: [phone]   HPI:   Chief Complaint: OBESITY Sheri [name] is here to discuss her progress with her obesity treatment plan. She is following the Category 3 plan and is following her eating plan approximately 100 % of the time. She states she is walking 30-40 minutes 3 times per week. Sheri [name] continues to do well with weight loss but she is somewhat frustrated that she is not losing weight faster. Hunger is controlled and she is doing well with meal prepping.   Her weight is 211 lb (95.7 kg) today and has had a weight loss of 2 pounds over a period of 2 weeks since her last visit. She has lost 16 lbs since starting treatment with [name].  Diabetes II Sheri [name] has a diagnosis of diabetes type II. Sheri [name] denies any hypoglycemic episodes. Last A1c was Hemoglobin A1C Latest Ref Rng & Units 05/30/2018 02/15/2018 08/10/2017  HGBA1C 4.8 - 5.6 % 6.4(H) 6.6(H) 6.0  Some recent data might be hidden    She has been working on intensive lifestyle modifications including diet, exercise, and weight loss to help control her blood glucose levels. Sheri [name] is stable on Metformin and denies any nausea, vomiting or muscle weakness. She will continue working on diet.   Vitamin D deficiency Sheri [name] has a diagnosis of vitamin D deficiency. She is stable taking prescription Vit D 50000 iu weekly. Sheri [name] denies nausea, vomiting or muscle weakness.  At risk for cardiovascular disease Sheri [name] is at a higher than average risk for cardiovascular disease due to obesity and diabetes. She currently denies any chest pain.  ALLERGIES: No Known Allergies  MEDICATIONS: Current Outpatient Medications on File Prior to Visit  Medication Sig Dispense Refill  . ALPRAZolam (XANAX) 0.5 MG tablet TAKE ONE TABLET TWICE DAILY AS NEEDED FOR AXNITEY OR SLEEP 45 tablet 5  . amitriptyline (ELAVIL) 25 MG tablet Take 3 tablets (75 mg total) by mouth at bedtime. For neuropathy 270 tablet 1  . atorvastatin (LIPITOR) 20 MG tablet TAKE 1 TABLET BY MOUTH  DAILY. 90 tablet 3  . benzonatate (TESSALON) 200 MG capsule Take 1 capsule (200 mg total) by mouth 3 (three) times daily as needed for cough. 20 capsule 0  . furosemide (LASIX) 20 MG tablet TAKE 1 TABLET BY MOUTH ONCE DAILY AS NEEDED FOR FLUID RETENTION 30 tablet 3  . glucose blood (ONE TOUCH TEST STRIPS) test strip Use as instructed     . lisinopril-hydrochlorothiazide (PRINZIDE,ZESTORETIC) 20-25 MG tablet TAKE 1 TABLET BY MOUTH DAILY 90 tablet 1  . meloxicam (MOBIC) 15 MG tablet Take 1 tablet (15 mg total) by mouth daily. 90 tablet 1  . pantoprazole (PROTONIX) 40 MG tablet Take 40 mg by mouth daily.     . sucralfate (CARAFATE) 1 g tablet Take 1 g by mouth 2 (two) times daily.      No current facility-administered medications on file prior to visit.     PAST MEDICAL HISTORY: Past Medical History:  Diagnosis Date  . Abdominal pain, epigastric   . Anxiety   . Back pain   . Diabetes mellitus, type 2 ([place])   . Diverticulosis of colon without hemorrhage   . Duodenitis with hemorrhage   . Edema   . Fluid retention   . GERD (gastroesophageal reflux disease)   . Heart murmur   . Hyperlipidemia   . Hypertension   . Leg edema   . Mitral regurgitation   . Obesity, unspecified   . Osteopenia   . Panic  disorder without agoraphobia   . Peripheral neuropathy   . Personal history of malignant neoplasm of cervix uteri    CIN-3 1989, normal since, gets pap smears annually  . Personal history of other genital system and obstetric disorders(V13.29)   . Prediabetes   . Rash and other nonspecific skin eruption   . Reflux esophagitis   . Swallowing difficulty   . Unspecified hereditary and idiopathic peripheral neuropathy   . Unspecified sleep apnea    sleep study- [facility]  . Vitamin D deficiency     PAST SURGICAL HISTORY: Past Surgical History:  Procedure Laterality Date  . ABDOMINAL HYSTERECTOMY    . BREAST CYST ASPIRATION     right , benign  . COLONOSCOPY WITH PROPOFOL N/A 06/09/2018    Procedure: COLONOSCOPY WITH PROPOFOL;  Surgeon: Lollie Sails, MD;  Location: [facility] ENDOSCOPY;  Service: Endoscopy;  Laterality: N/A;  . DILATION AND CURETTAGE OF UTERUS    . ESOPHAGOGASTRODUODENOSCOPY N/A 06/09/2018   Procedure: ESOPHAGOGASTRODUODENOSCOPY (EGD);  Surgeon: Lollie Sails, MD;  Location: [facility] ENDOSCOPY;  Service: Endoscopy;  Laterality: N/A;  . RIGHT OOPHORECTOMY  2001   menopause, endometriosis s/p.  HRT  since- has not tolerated previous attempts to wean    SOCIAL HISTORY: Social History   Tobacco Use  . Smoking status: Never Smoker  . Smokeless tobacco: Never Used  Substance Use Topics  . Alcohol use: Yes    Alcohol/week: 0.0 standard drinks    Comment: rare  . Drug use: No    FAMILY HISTORY: Family History  Problem Relation Age of Onset  . Osteopenia Mother   . Hypothyroidism Mother   . Diabetes Mother   . Hyperlipidemia Mother   . Hypertension Mother   . Obesity Mother   . Diabetes Father   . Alzheimer's disease Father   . Coronary artery disease Father   . Obesity Father   . Cancer Neg Hx        breast, ovarian, colon  . Breast cancer Neg Hx     ROS: Review of Systems  Constitutional: Positive for weight loss.  Cardiovascular: Negative for chest pain.  Gastrointestinal: Negative for nausea and vomiting.  Musculoskeletal:       Negative for muscle weakness    PHYSICAL EXAM: Blood pressure 129/80, pulse (!) 119, height 5\' 3"  (1.6 m), weight 211 lb (95.7 kg), SpO2 96 %. Body mass index is 37.38 kg/m. Physical Exam  Constitutional: She is oriented to person, place, and time. She appears well-developed and well-nourished.  Cardiovascular: Normal rate.  Pulmonary/Chest: Effort normal.  Musculoskeletal: Normal range of motion.  Neurological: She is alert and oriented to person, place, and time.  Skin: Skin is warm and dry.  Psychiatric: She has a normal mood and affect. Her behavior is normal.  Vitals reviewed.   RECENT LABS AND  TESTS: BMET    Component Value Date/Time   NA 137 05/30/2018 1248   NA 139 04/18/2012 0931   K 4.3 05/30/2018 1248   K 4.1 04/18/2012 0931   CL 100 05/30/2018 1248   CL 106 04/18/2012 0931   CO2 20 05/30/2018 1248   CO2 23 04/18/2012 0931   GLUCOSE 112 (H) 05/30/2018 1248   GLUCOSE 160 (H) 02/15/2018 0905   GLUCOSE 105 (H) 04/18/2012 0931   BUN 19 05/30/2018 1248   BUN 13 04/18/2012 0931   CREATININE 0.71 05/30/2018 1248   CREATININE 0.63 04/18/2012 0931   CALCIUM 9.5 05/30/2018 1248   CALCIUM 8.9 04/18/2012 0931  GFRNONAA 92 05/30/2018 1248   GFRNONAA >60 04/18/2012 0931   GFRAA 106 05/30/2018 1248   GFRAA >60 04/18/2012 0931   Lab Results  Component Value Date   HGBA1C 6.4 (H) 05/30/2018   HGBA1C 6.6 (H) 02/15/2018   HGBA1C 6.0 08/10/2017   HGBA1C 6.1 02/11/2017   HGBA1C 5.8 11/12/2016   Lab Results  Component Value Date   INSULIN 20.8 05/30/2018   CBC    Component Value Date/Time   WBC 6.6 05/30/2018 1248   WBC 5.1 05/11/2013 0928   RBC 5.00 05/30/2018 1248   RBC 4.62 05/11/2013 0928   HGB 14.4 05/30/2018 1248   HCT 43.8 05/30/2018 1248   PLT 301.0 05/11/2013 0928   MCV 88 05/30/2018 1248   MCH 28.8 05/30/2018 1248   MCHC 32.9 05/30/2018 1248   MCHC 34.3 05/11/2013 0928   RDW 11.9 (L) 05/30/2018 1248   LYMPHSABS 2.2 05/30/2018 1248   MONOABS 0.5 05/11/2013 0928   EOSABS 0.1 05/30/2018 1248   BASOSABS 0.0 05/30/2018 1248   Iron/TIBC/Ferritin/ %Sat No results found for: IRON, TIBC, FERRITIN, IRONPCTSAT Lipid Panel     Component Value Date/Time   CHOL 165 05/30/2018 1248   TRIG 234 (H) 05/30/2018 1248   HDL 40 05/30/2018 1248   CHOLHDL 4 02/15/2018 0905   VLDL 37.0 02/15/2018 0905   LDLCALC 78 05/30/2018 1248   LDLDIRECT 97.0 08/11/2016 0853   Hepatic Function Panel     Component Value Date/Time   PROT 7.3 05/30/2018 1248   PROT 7.5 04/18/2012 0931   ALBUMIN 4.6 05/30/2018 1248   ALBUMIN 4.0 04/18/2012 0931   AST 22 05/30/2018 1248   AST  26 04/18/2012 0931   ALT 30 05/30/2018 1248   ALT 26 04/18/2012 0931   ALKPHOS 88 05/30/2018 1248   ALKPHOS 55 04/18/2012 0931   BILITOT 0.8 05/30/2018 1248   BILITOT 0.8 04/18/2012 0931      Component Value Date/Time   TSH 2.940 05/30/2018 1248   TSH 1.02 05/11/2013 0928   Results for Pickford, Nadege ANN (MRN [id number]) as of 07/17/2018 18:41  Ref. Range 05/30/2018 12:48  Vitamin D, 25-Hydroxy Latest Ref Range: 30.0 - 100.0 ng/mL 26.6 (L)   ASSESSMENT AND PLAN: Type 2 diabetes mellitus without complication, without long-term current use of insulin (HCC) - Plan: metFORMIN (GLUCOPHAGE) 500 MG tablet  Vitamin D deficiency - Plan: Vitamin D, Ergocalciferol, (DRISDOL) 50000 units CAPS capsule  At risk for heart disease  Class 2 severe obesity with serious comorbidity and body mass index (BMI) of 37.0 to 37.9 in adult, unspecified obesity type ([place])  PLAN: Diabetes II Sheri [name] has been given extensive diabetes education by myself today including ideal fasting and post-prandial blood glucose readings, individual ideal HgA1c goals  and hypoglycemia prevention. We discussed the importance of good blood sugar control to decrease the likelihood of diabetic complications such as nephropathy, neuropathy, limb loss, blindness, coronary artery disease, and death. We discussed the importance of intensive lifestyle modification including diet, exercise and weight loss as the first line treatment for diabetes. Sheri [name] agrees to continue taking Metformin 500 mg qd #30 with no refills. Sheri [name] will follow up in our office in 2 weeks.  Vitamin D Deficiency Sheri [name] was informed that low vitamin D levels contributes to fatigue and are associated with obesity, breast, and colon cancer. She agrees to continue to take prescription Vit D @50 ,000 IU every week #4 with no refills. Sheri [name] will follow up for routine testing of vitamin D, at least 2-3  times per year. She was informed of the risk of over-replacement of vitamin D and agrees to  not increase her dose unless she discusses this with [name] first. Sheri [name] has agreed to follow up in 2 weeks.   Cardiovascular risk counselling Sheri [name] was given extended (15 minutes) coronary artery disease prevention counseling today. She is 62 y.o. female and has risk factors for heart disease including obesity. We discussed intensive lifestyle modifications today with an emphasis on specific weight loss instructions and strategies. Pt was also informed of the importance of increasing exercise and decreasing saturated fats to help prevent heart disease.  Obesity Sheri [name] is currently in the action stage of change. As such, her goal is to continue with weight loss efforts She has agreed to follow the Category 3 plan Sheri [name] has been instructed to work up to a goal of 150 minutes of combined cardio and strengthening exercise per week for weight loss and overall health benefits. We discussed the following Behavioral Modification Stratagies today: increasing lean protein intake, decreasing simple carbohydrates , work on meal planning and easy cooking plans and holiday eating strategies   Mildreth has agreed to follow up with our clinic in 2 weeks. She was informed of the importance of frequent follow up visits to maximize her success with intensive lifestyle modifications for her multiple health conditions.   OBESITY BEHAVIORAL INTERVENTION VISIT  Today's visit was # 4   Starting weight: 227 lbs Starting date: 05/30/2018 Today's weight : Weight: 211 lb (95.7 kg)  Today's date: 07/13/2018 Total lbs lost to date: 16 lbs   ASK: We discussed the diagnosis of obesity with Aleen Sells [place] today and Kynnadi agreed to give [name] permission to discuss obesity behavioral modification therapy today.  ASSESS: Royann has the diagnosis of obesity and her BMI today is 37.39 Taylee is in the action stage of change   ADVISE: Nashonda was educated on the multiple health risks of obesity as well as the benefit of weight loss to improve her health. She  was advised of the need for long term treatment and the importance of lifestyle modifications to improve her current health and to decrease her risk of future health problems.  AGREE: Multiple dietary modification options and treatment options were discussed and  Gretel agreed to follow the recommendations documented in the above note.  ARRANGE: Delaina was educated on the importance of frequent visits to treat obesity as outlined per CMS and USPSTF guidelines and agreed to schedule her next follow up appointment today.  I, Remi Deter, CMA, am acting as transcriptionist for Dennard Nip, MD  I have reviewed the above documentation for accuracy and completeness, and I agree with the above. -Dennard Nip, MD

## 2018-07-18 ENCOUNTER — Encounter (INDEPENDENT_AMBULATORY_CARE_PROVIDER_SITE_OTHER): Payer: Self-pay | Admitting: Family Medicine

## 2018-08-02 NOTE — Progress Notes (Addendum)
Office: [phone]  /  Fax: [phone]   Date: August 03, 2018  Time Seen: 8:25am Duration: 32 minutes Provider: Glennie Isle, Psy.D. Type of Session: Individual Therapy   HPI: LuAnnwas referred by Dr. Dennard Nip and was seen for an initial appointment by this provider on June 14, 2018. Per the note for the initial visit withDr. Desmond Dike September 10,2019, Sheri [name] reported experiencing the following:significant food cravings issues; snackingfrequently in the evenings; skippingmeals frequently;frequently drinking liquids with calories;frequentlymakingpoor food choices; havingproblems with excessive hunger;frequentlyeatinglarger portions than normal;binge eating behaviors; and strugglingwith emotional eating.Sheri [name]'s Food and Mood (modified PHQ-9) score was26. In addition, Sheri Annstarted gaining weight in the last 11 years andherheaviest weight ever was 234pounds.During the initial appointment with this provider, Sheri [name] reported eating when she is stressed or upset about things. 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 Sheri [name] reported she binge eats when she is by herself. She noted her last binge was the week before she started with the clinic. Sheri [name] indicated that during her last binge episode she consumed two slicesof pizza, ajar of pickles, a large bag of Cheetos, and four scoops of ice cream over the course of four to five hours.Moreover, she denied a history of purging and engaging in other compensatory strategies. Sheri [name] noted she has never been diagnosed with an eating disorder.Furthermore, LuAnnwas asked to complete a questionnaire assessing various behaviors related to emotional eating.Sheri [name] the following:experience food cravings on a regular basis, eat certain foods when you are anxious, stressed, depressed, or your feelings are hurt, use food to help you cope with emotional  situations, find food is comforting to you, overeat when you are angry or upset, overeat when you are worried about something, overeat frequently when you are bored or lonely, overeat when you are angry at someone just to show them they cannot control you and overeat when you are alone, but eat much less when you are with other people. During today's appointment, Sheri [name] did not report any emotional eating episodes.  Session Content: Session focused on the following treatment goal: decrease emotional eating. The session was initiated with the administration of the PHQ-9 and GAD-7, as well as a brief check-in. Sheri [name] shared that some of her close friends are "dying."  Thus, the initial portion of today's appointment focused on processing associated thoughts and feelings. Regarding triggers for emotional eating, Sheri [name] indicated she has not experienced any triggers since the last appointment with this provider. She added, "I feel in control." She also denied episodes of emotional eating. Sheri [name] reported an increase in socialization. Psychoeducation regarding the connection between thoughts, feelings, and behaviors was provided. Additionally, Sheri [name] was introduced to pleasurable activities, which are behaviors, that can impact emotional eating as they can be be used as coping skills. Sheri [name] was given handouts outlining the aforementioned. Session concluded with a discussion regarding the frequency of appointments based on Sheri [name]'s self-report of doing well as well as the decrease in PHQ-9 and GAD 7 scores.  Overall, Sheri [name] was receptive to today's appointment as evidenced by her openness to sharing, responsiveness to feedback, and willingness to engage in pleasurable activities between now and the next appointment.  Mental Status Examination: Sheri [name] arrived early for the appointment; therefore, the appointment was initiated early. She presented as appropriately dressed and groomed. Sheri [name] appeared her stated age and  demonstrated adequate orientation to time, place, person, and  purpose of the appointment. She also demonstrated appropriate eye contact. No psychomotor abnormalities or behavioral peculiarities noted. Her mood was euthymic with congruent affect. Her thought processes were logical, linear, and goal-directed. No hallucinations, delusions, bizarre thinking or behavior reported or observed. Judgment, insight, and impulse control appeared to be grossly intact. There was no evidence of paraphasias (i.e., errors in speech, gross mispronunciations, and word substitutions), repetition deficits, or disturbances in volume or prosody (i.e., rhythm and intonation). There was no evidence of attention or memory impairments. Sheri [name] denied current suicidal and homicidal ideation, intent or plan.  Structured Assessment Results: The Patient Health Questionnaire-9 (PHQ-9) is a self-report measure that assesses symptoms and severity of depression over the course of the last two weeks. Sheri [name] obtained a score of zero. Depression screen [facility] 2/9 08/03/2018  Decreased Interest 0  Down, Depressed, Hopeless 0  PHQ - 2 Score 0  Altered sleeping 0  Tired, decreased energy 0  Change in appetite 0  Feeling bad or failure about yourself  0  Trouble concentrating 0  Moving slowly or fidgety/restless 0  Suicidal thoughts 0  PHQ-9 Score 0  Difficult doing work/chores -   The Generalized Anxiety Disorder-7 (GAD-7) is a brief self-report measure that assesses symptoms of anxiety over the course of the last two weeks. Sheri [name] obtained a score of two suggesting minimal anxiety. GAD 7 : Generalized Anxiety Score 08/03/2018  Nervous, Anxious, on Edge 0  Control/stop worrying 1  Worry too much - different things 1  Trouble relaxing 0  Restless 0  Easily annoyed or irritable 0  Afraid - awful might happen 0  Total GAD 7 Score 2  Anxiety Difficulty Not difficult at all   Interventions: Sheri [name] was administered the PHQ-9 and GAD-7  for symptom monitoring. Content from the last session was reviewed. Throughout today's session, empathic reflections and validation were provided. Psychoeducation regarding the connection between thoughts, feelings, and behaviors as well as pleasurable activities was provided.  DSM-5 Diagnosis: 296.32 (F33.1) Major Depressive Disorder, Recurrent Episode, Moderate, With Anxious Distress, Mild  Treatment Goal & Progress: Sheri [name] was seen for an initial appointment with this provider on June 14, 2018 during which the following treatment goal was established: decrease emotional eating. Sheri [name] has demonstrated progress in her goal of decreasing emotional eating as evidenced by increased awareness of hunger patterns and triggers for emotional eating.  During today's appointment, Sheri [name] denied engaging in emotional eating since the last appointment with this provider. She also demonstrates willingness to engage in learned skills.  Plan: Sheri [name] continues to appear able and willing to participate as evidenced by engagement in reciprocal conversation, and asking questions for clarification as appropriate. The next appointment will be scheduled in three weeks. The next session will focus on reviewing learned skills, and the introduction of mindfulness.

## 2018-08-03 ENCOUNTER — Ambulatory Visit (INDEPENDENT_AMBULATORY_CARE_PROVIDER_SITE_OTHER): Payer: 59 | Admitting: Psychology

## 2018-08-03 ENCOUNTER — Ambulatory Visit (INDEPENDENT_AMBULATORY_CARE_PROVIDER_SITE_OTHER): Payer: 59 | Admitting: Family Medicine

## 2018-08-03 VITALS — BP 114/77 | HR 84 | Temp 98.0°F | Ht 63.0 in | Wt 207.0 lb

## 2018-08-03 DIAGNOSIS — E559 Vitamin D deficiency, unspecified: Secondary | ICD-10-CM | POA: Diagnosis not present

## 2018-08-03 DIAGNOSIS — Z9189 Other specified personal risk factors, not elsewhere classified: Secondary | ICD-10-CM | POA: Diagnosis not present

## 2018-08-03 DIAGNOSIS — F331 Major depressive disorder, recurrent, moderate: Secondary | ICD-10-CM | POA: Diagnosis not present

## 2018-08-03 DIAGNOSIS — Z6836 Body mass index (BMI) 36.0-36.9, adult: Secondary | ICD-10-CM | POA: Diagnosis not present

## 2018-08-03 DIAGNOSIS — E1140 Type 2 diabetes mellitus with diabetic neuropathy, unspecified: Secondary | ICD-10-CM

## 2018-08-03 MED ORDER — VITAMIN D (ERGOCALCIFEROL) 1.25 MG (50000 UNIT) PO CAPS: 50000.0000 [IU] | ORAL_CAPSULE | ORAL | 0 refills | Status: DC

## 2018-08-03 MED ORDER — METFORMIN HCL 500 MG PO TABS: 500.0000 mg | ORAL_TABLET | Freq: Every day | ORAL | 0 refills | Status: DC

## 2018-08-08 NOTE — Progress Notes (Signed)
Office: [phone]  /  Fax: [phone]   HPI:   Chief Complaint: OBESITY Sheri [name] is here to discuss her progress with her obesity treatment plan. She is on the Category 3 plan and is following her eating plan approximately 100 % of the time. She states she is exercising 0 minutes 0 times per week. Alazay continues to do well with weight loss on her Category 3 plan. She is working on reducing emotional eating and is getting more support from family and friends.  Her weight is 207 lb (93.9 kg) today and has had a weight loss of 4 pounds over a period of 3 weeks since her last visit. She has lost 20 lbs since starting treatment with [name].  Diabetes II with Diabetic Neuropathy [name] has a diagnosis of diabetes type II. [name] is stable on metformin and she denies nausea, vomiting, or any hypoglycemic episodes. She is doing well on her diet. Last A1c was 6.4 on 05/30/18. She has been working on intensive lifestyle modifications including diet, exercise, and weight loss to help control her blood glucose levels.  At risk for cardiovascular disease [name] is at a higher than average risk for cardiovascular disease due to diabetes and obesity. She currently denies any chest pain.  Vitamin D deficiency [name] has a diagnosis of vitamin D deficiency. She is currently taking vit D and is stable, but not yet at goal. She denies nausea, vomiting, or muscle weakness.  ALLERGIES: No Known Allergies  MEDICATIONS: Current Outpatient Medications on File Prior to Visit  Medication Sig Dispense Refill  . ALPRAZolam (XANAX) 0.5 MG tablet TAKE ONE TABLET TWICE DAILY AS NEEDED FOR AXNITEY OR SLEEP 45 tablet 5  . amitriptyline (ELAVIL) 25 MG tablet Take 3 tablets (75 mg total) by mouth at bedtime. For neuropathy 270 tablet 1  . atorvastatin (LIPITOR) 20 MG tablet TAKE 1 TABLET BY MOUTH DAILY. 90 tablet 3  . benzonatate (TESSALON) 200 MG capsule Take 1 capsule (200 mg total) by mouth 3 (three) times daily as needed for cough. 20  capsule 0  . furosemide (LASIX) 20 MG tablet TAKE 1 TABLET BY MOUTH ONCE DAILY AS NEEDED FOR FLUID RETENTION 30 tablet 3  . glucose blood (ONE TOUCH TEST STRIPS) test strip Use as instructed     . lisinopril-hydrochlorothiazide (PRINZIDE,ZESTORETIC) 20-25 MG tablet TAKE 1 TABLET BY MOUTH DAILY 90 tablet 1  . meloxicam (MOBIC) 15 MG tablet Take 1 tablet (15 mg total) by mouth daily. 90 tablet 1  . pantoprazole (PROTONIX) 40 MG tablet Take 40 mg by mouth daily.      No current facility-administered medications on file prior to visit.     PAST MEDICAL HISTORY: Past Medical History:  Diagnosis Date  . Abdominal pain, epigastric   . Anxiety   . Back pain   . Diabetes mellitus, type 2 ([place])   . Diverticulosis of colon without hemorrhage   . Duodenitis with hemorrhage   . Edema   . Fluid retention   . GERD (gastroesophageal reflux disease)   . Heart murmur   . Hyperlipidemia   . Hypertension   . Leg edema   . Mitral regurgitation   . Obesity, unspecified   . Osteopenia   . Panic disorder without agoraphobia   . Peripheral neuropathy   . Personal history of malignant neoplasm of cervix uteri    CIN-3 1989, normal since, gets pap smears annually  . Personal history of other genital system and obstetric disorders(V13.29)   . Prediabetes   .  Rash and other nonspecific skin eruption   . Reflux esophagitis   . Swallowing difficulty   . Unspecified hereditary and idiopathic peripheral neuropathy   . Unspecified sleep apnea    sleep study- [facility]  . Vitamin D deficiency     PAST SURGICAL HISTORY: Past Surgical History:  Procedure Laterality Date  . ABDOMINAL HYSTERECTOMY    . BREAST CYST ASPIRATION     right , benign  . COLONOSCOPY WITH PROPOFOL N/A 06/09/2018   Procedure: COLONOSCOPY WITH PROPOFOL;  Surgeon: Lollie Sails, MD;  Location: [facility] ENDOSCOPY;  Service: Endoscopy;  Laterality: N/A;  . DILATION AND CURETTAGE OF UTERUS    . ESOPHAGOGASTRODUODENOSCOPY N/A 06/09/2018    Procedure: ESOPHAGOGASTRODUODENOSCOPY (EGD);  Surgeon: Lollie Sails, MD;  Location: [facility] ENDOSCOPY;  Service: Endoscopy;  Laterality: N/A;  . RIGHT OOPHORECTOMY  2001   menopause, endometriosis s/p.  HRT  since- has not tolerated previous attempts to wean    SOCIAL HISTORY: Social History   Tobacco Use  . Smoking status: Never Smoker  . Smokeless tobacco: Never Used  Substance Use Topics  . Alcohol use: Yes    Alcohol/week: 0.0 standard drinks    Comment: rare  . Drug use: No    FAMILY HISTORY: Family History  Problem Relation Age of Onset  . Osteopenia Mother   . Hypothyroidism Mother   . Diabetes Mother   . Hyperlipidemia Mother   . Hypertension Mother   . Obesity Mother   . Diabetes Father   . Alzheimer's disease Father   . Coronary artery disease Father   . Obesity Father   . Cancer Neg Hx        breast, ovarian, colon  . Breast cancer Neg Hx     ROS: Review of Systems  Constitutional: Positive for weight loss.  Cardiovascular: Negative for chest pain.  Gastrointestinal: Negative for nausea and vomiting.  Musculoskeletal:       Negative for muscle weakness.  Endo/Heme/Allergies:       Negative for hypoglycemia.    PHYSICAL EXAM: Blood pressure 114/77, pulse 84, temperature 98 F (36.7 C), temperature source Oral, height 5\' 3"  (1.6 m), weight 207 lb (93.9 kg), SpO2 99 %. Body mass index is 36.67 kg/m. Physical Exam  Constitutional: She is oriented to person, place, and time. She appears well-developed and well-nourished.  Cardiovascular: Normal rate.  Pulmonary/Chest: Effort normal.  Musculoskeletal: Normal range of motion.  Neurological: She is oriented to person, place, and time.  Skin: Skin is warm and dry.  Psychiatric: She has a normal mood and affect. Her behavior is normal.  Vitals reviewed.   RECENT LABS AND TESTS: BMET    Component Value Date/Time   NA 137 05/30/2018 1248   NA 139 04/18/2012 0931   K 4.3 05/30/2018 1248   K 4.1  04/18/2012 0931   CL 100 05/30/2018 1248   CL 106 04/18/2012 0931   CO2 20 05/30/2018 1248   CO2 23 04/18/2012 0931   GLUCOSE 112 (H) 05/30/2018 1248   GLUCOSE 160 (H) 02/15/2018 0905   GLUCOSE 105 (H) 04/18/2012 0931   BUN 19 05/30/2018 1248   BUN 13 04/18/2012 0931   CREATININE 0.71 05/30/2018 1248   CREATININE 0.63 04/18/2012 0931   CALCIUM 9.5 05/30/2018 1248   CALCIUM 8.9 04/18/2012 0931   GFRNONAA 92 05/30/2018 1248   GFRNONAA >60 04/18/2012 0931   GFRAA 106 05/30/2018 1248   GFRAA >60 04/18/2012 0931   Lab Results  Component Value Date  HGBA1C 6.4 (H) 05/30/2018   HGBA1C 6.6 (H) 02/15/2018   HGBA1C 6.0 08/10/2017   HGBA1C 6.1 02/11/2017   HGBA1C 5.8 11/12/2016   Lab Results  Component Value Date   INSULIN 20.8 05/30/2018   CBC    Component Value Date/Time   WBC 6.6 05/30/2018 1248   WBC 5.1 05/11/2013 0928   RBC 5.00 05/30/2018 1248   RBC 4.62 05/11/2013 0928   HGB 14.4 05/30/2018 1248   HCT 43.8 05/30/2018 1248   PLT 301.0 05/11/2013 0928   MCV 88 05/30/2018 1248   MCH 28.8 05/30/2018 1248   MCHC 32.9 05/30/2018 1248   MCHC 34.3 05/11/2013 0928   RDW 11.9 (L) 05/30/2018 1248   LYMPHSABS 2.2 05/30/2018 1248   MONOABS 0.5 05/11/2013 0928   EOSABS 0.1 05/30/2018 1248   BASOSABS 0.0 05/30/2018 1248   Iron/TIBC/Ferritin/ %Sat No results found for: IRON, TIBC, FERRITIN, IRONPCTSAT Lipid Panel     Component Value Date/Time   CHOL 165 05/30/2018 1248   TRIG 234 (H) 05/30/2018 1248   HDL 40 05/30/2018 1248   CHOLHDL 4 02/15/2018 0905   VLDL 37.0 02/15/2018 0905   LDLCALC 78 05/30/2018 1248   LDLDIRECT 97.0 08/11/2016 0853   Hepatic Function Panel     Component Value Date/Time   PROT 7.3 05/30/2018 1248   PROT 7.5 04/18/2012 0931   ALBUMIN 4.6 05/30/2018 1248   ALBUMIN 4.0 04/18/2012 0931   AST 22 05/30/2018 1248   AST 26 04/18/2012 0931   ALT 30 05/30/2018 1248   ALT 26 04/18/2012 0931   ALKPHOS 88 05/30/2018 1248   ALKPHOS 55 04/18/2012  0931   BILITOT 0.8 05/30/2018 1248   BILITOT 0.8 04/18/2012 0931      Component Value Date/Time   TSH 2.940 05/30/2018 1248   TSH 1.02 05/11/2013 0928   Results for Mroz, Miabella ANN (MRN [id number]) as of 08/08/2018 12:01  Ref. Range 05/30/2018 12:48  Vitamin D, 25-Hydroxy Latest Ref Range: 30.0 - 100.0 ng/mL 26.6 (L)   ASSESSMENT AND PLAN: Type 2 diabetes mellitus with diabetic neuropathy, without long-term current use of insulin (HCC) - Plan: metFORMIN (GLUCOPHAGE) 500 MG tablet  Vitamin D deficiency - Plan: Vitamin D, Ergocalciferol, (DRISDOL) 1.25 MG (50000 UT) CAPS capsule  At risk for heart disease  Class 2 severe obesity with serious comorbidity and body mass index (BMI) of 36.0 to 36.9 in adult, unspecified obesity type ([place])  PLAN:  Diabetes II with Diabetic Neuropathy Yaris has been given extensive diabetes education by myself today including ideal fasting and post-prandial blood glucose readings, individual ideal Hgb A1c goals, and hypoglycemia prevention. We discussed the importance of good blood sugar control to decrease the likelihood of diabetic complications such as nephropathy, neuropathy, limb loss, blindness, coronary artery disease, and death. We discussed the importance of intensive lifestyle modification including diet, exercise and weight loss as the first line treatment for diabetes. Raeanne agrees to continue her metformin 500mg  PO qAM #30 with no refills and will follow up at the agreed upon time in 3 to 4 weeks.  Cardiovascular risk counselling [place] was given extended (15 minutes) coronary artery disease prevention counseling today. She is 62 y.o. female and has risk factors for heart disease including diabetes and obesity. We discussed intensive lifestyle modifications today with an emphasis on specific weight loss instructions and strategies. Pt was also informed of the importance of increasing exercise and decreasing saturated fats to help prevent heart  disease.  Vitamin D Deficiency Renisha was informed  that low vitamin D levels contributes to fatigue and are associated with obesity, breast, and colon cancer. She agrees to continue to take prescription Vit D @50 ,000 IU every week #4 with no refills and will follow up for routine testing of vitamin D, at least 2-3 times per year. She was informed of the risk of over-replacement of vitamin D and agrees to not increase her dose unless she discusses this with [name] first. Aleen Sells agrees to follow up as directed.  Obesity Keesha is currently in the action stage of change. As such, her goal is to continue with weight loss efforts She has agreed to follow the Category 3 plan. Janelys has been instructed to work up to a goal of 150 minutes of combined cardio and strengthening exercise per week for weight loss and overall health benefits. We discussed the following Behavioral Modification Strategies today: increasing lean protein intake, decreasing simple carbohydrates, and holiday eating strategies.   Hermie has agreed to follow up with our clinic in 3 to 4 weeks. She was informed of the importance of frequent follow up visits to maximize her success with intensive lifestyle modifications for her multiple health conditions.   OBESITY BEHAVIORAL INTERVENTION VISIT  Today's visit was # 5   Starting weight: 227 lbs Starting date: 05/30/18 Today's weight : Weight: 207 lb (93.9 kg)  Today's date: 08/03/2018 Total lbs lost to date: 4  ASK: We discussed the diagnosis of obesity with Aleen Sells [place] today and Hellen agreed to give [name] permission to discuss obesity behavioral modification therapy today.  ASSESS: Gerrie has the diagnosis of obesity and her BMI today is 36.68. Sanika is in the action stage of change.   ADVISE: Manjit was educated on the multiple health risks of obesity as well as the benefit of weight loss to improve her health. She was advised of the need for long term treatment and the importance of lifestyle  modifications to improve her current health and to decrease her risk of future health problems.  AGREE: Multiple dietary modification options and treatment options were discussed and Chrystel agreed to follow the recommendations documented in the above note.  ARRANGE: Coriann was educated on the importance of frequent visits to treat obesity as outlined per CMS and USPSTF guidelines and agreed to schedule her next follow up appointment today.  I, Marcille Blanco, am acting as transcriptionist for Starlyn Skeans, MD  I have reviewed the above documentation for accuracy and completeness, and I agree with the above. -Dennard Nip, MD

## 2018-08-22 ENCOUNTER — Ambulatory Visit: Payer: Self-pay | Admitting: Internal Medicine

## 2018-08-24 ENCOUNTER — Ambulatory Visit: Payer: 59 | Admitting: Internal Medicine

## 2018-08-24 ENCOUNTER — Encounter: Payer: Self-pay | Admitting: Internal Medicine

## 2018-08-24 DIAGNOSIS — Z6839 Body mass index (BMI) 39.0-39.9, adult: Secondary | ICD-10-CM | POA: Diagnosis not present

## 2018-08-24 DIAGNOSIS — F5104 Psychophysiologic insomnia: Secondary | ICD-10-CM

## 2018-08-24 DIAGNOSIS — I10 Essential (primary) hypertension: Secondary | ICD-10-CM | POA: Diagnosis not present

## 2018-08-24 DIAGNOSIS — E1142 Type 2 diabetes mellitus with diabetic polyneuropathy: Secondary | ICD-10-CM

## 2018-08-24 DIAGNOSIS — R05 Cough: Secondary | ICD-10-CM

## 2018-08-24 DIAGNOSIS — R059 Cough, unspecified: Secondary | ICD-10-CM

## 2018-08-24 MED ORDER — ZOSTER VAC RECOMB ADJUVANTED 50 MCG/0.5ML IM SUSR: 0.5000 mL | Freq: Once | INTRAMUSCULAR | 1 refills | Status: AC

## 2018-08-24 NOTE — Patient Instructions (Addendum)
You are doing fantastic!!  I  Am so impressed with your progress   For your  Sneezing /allergies ,  You can use Benadryl at bedtime,  but you should also consider adding one of these newer second generation antihistamines that are longer acting, non sedating and  available OTC:  Generic  Zyrtec, which is cetirizine.    generic Allegra , available generically as fexofenadine ; comes in 60 mg and 180 mg once daily strengths.    Generic Claritin :  also available as loratidine .
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
## 2018-08-26 NOTE — Assessment & Plan Note (Signed)
Reviewed her previous success at weight loss,  Her goal weight for BMI < 30 .I have congratulated her in reduction of   BMI and encouraged  Continued weight loss with goal of 10% of body weigh over the next 6 months using a low glycemic index diet and regular exercise a minimum of 5 days per week.

## 2018-08-26 NOTE — Assessment & Plan Note (Signed)
Currently well-controlled on metformin .  hemoglobin A1c is at goal of less than 7.0 . Patient is reminded to schedule an annual eye exam and foot exam is normal today. Patient has no microalbuminuria. Patient is tolerating statin therapy for CAD risk reduction and on ACE/ARB for renal protection and hypertension   Lab Results  Component Value Date   HGBA1C 6.4 (H) 05/30/2018

## 2018-08-26 NOTE — Assessment & Plan Note (Signed)
Well controlled on current regimen. Renal function stable, no changes today. 

## 2018-08-26 NOTE — Assessment & Plan Note (Signed)
ENT and GI evaluations done.  Esophagitis found and treated.  Adding antihistamine for cough

## 2018-08-28 ENCOUNTER — Other Ambulatory Visit: Payer: Self-pay | Admitting: Internal Medicine

## 2018-08-29 ENCOUNTER — Ambulatory Visit (INDEPENDENT_AMBULATORY_CARE_PROVIDER_SITE_OTHER): Payer: 59 | Admitting: Family Medicine

## 2018-08-29 ENCOUNTER — Ambulatory Visit (INDEPENDENT_AMBULATORY_CARE_PROVIDER_SITE_OTHER): Payer: 59 | Admitting: Psychology

## 2018-08-29 ENCOUNTER — Encounter (INDEPENDENT_AMBULATORY_CARE_PROVIDER_SITE_OTHER): Payer: Self-pay | Admitting: Family Medicine

## 2018-08-29 VITALS — BP 96/64 | HR 92 | Temp 98.2°F | Ht 63.0 in | Wt 200.0 lb

## 2018-08-29 DIAGNOSIS — Z6835 Body mass index (BMI) 35.0-35.9, adult: Secondary | ICD-10-CM

## 2018-08-29 DIAGNOSIS — E119 Type 2 diabetes mellitus without complications: Secondary | ICD-10-CM | POA: Diagnosis not present

## 2018-08-29 DIAGNOSIS — F331 Major depressive disorder, recurrent, moderate: Secondary | ICD-10-CM | POA: Diagnosis not present

## 2018-08-29 DIAGNOSIS — E7849 Other hyperlipidemia: Secondary | ICD-10-CM

## 2018-08-29 DIAGNOSIS — E1140 Type 2 diabetes mellitus with diabetic neuropathy, unspecified: Secondary | ICD-10-CM | POA: Diagnosis not present

## 2018-08-29 DIAGNOSIS — E559 Vitamin D deficiency, unspecified: Secondary | ICD-10-CM | POA: Diagnosis not present

## 2018-08-29 DIAGNOSIS — Z9189 Other specified personal risk factors, not elsewhere classified: Secondary | ICD-10-CM | POA: Diagnosis not present

## 2018-08-29 MED ORDER — VITAMIN D (ERGOCALCIFEROL) 1.25 MG (50000 UNIT) PO CAPS: 50000.0000 [IU] | ORAL_CAPSULE | ORAL | 0 refills | Status: DC

## 2018-08-29 MED ORDER — METFORMIN HCL 500 MG PO TABS: 500.0000 mg | ORAL_TABLET | Freq: Every day | ORAL | 0 refills | Status: DC

## 2018-08-29 NOTE — Progress Notes (Signed)
Office: [phone]  /  Fax: [phone]   HPI:   Chief Complaint: OBESITY Sheri [name] is here to discuss her progress with her obesity treatment plan. She is on the Category 3 plan and is following her eating plan approximately 99.5-100 % of the time. She states she is walking for 30-45 minutes 3-4 times per week. Sheri [name] continues to do very well with weight loss. She is getting good support from her mother, but not as much from her husband. She has gotten better at not letting him sabotage her efforts.  Her weight is 200 lb (90.7 kg) today and has had a weight loss of 7 pounds over a period of 3 to 4 weeks since her last visit. She has lost 27 lbs since starting treatment with [name].  Vitamin D Deficiency Sheri [name] has a diagnosis of vitamin D deficiency. She is stable on prescription Vit D, but level is not yet at goal. She denies nausea, vomiting or muscle weakness.  Diabetes II Sheri [name] has a diagnosis of diabetes type II. Sheri [name]'s last A1c was well controlled with diet and weight loss. She is stable on metformin and denies nausea, vomiting, or hypoglycemia. She has been working on intensive lifestyle modifications including diet, exercise, and weight loss to help control her blood glucose levels.  Hyperlipidemia Sheri [name] has hyperlipidemia and has been trying to improve her cholesterol levels with intensive lifestyle modification including a low saturated fat diet, exercise and weight loss. She is on Lipitor and doing well on diet and denies any chest pain, claudication or myalgias.  At risk for cardiovascular disease Sheri [name] is at a higher than average risk for cardiovascular disease due to obesity, diabetes II, and hyperlipidemia. She currently denies any chest pain.  ALLERGIES: No Known Allergies  MEDICATIONS: Current Outpatient Medications on File Prior to Visit  Medication Sig Dispense Refill  . ALPRAZolam (XANAX) 0.5 MG tablet TAKE ONE TABLET TWICE DAILY AS NEEDED FOR AXNITEY OR SLEEP 45 tablet 5  . amitriptyline  (ELAVIL) 25 MG tablet Take 3 tablets (75 mg total) by mouth at bedtime. For neuropathy 270 tablet 1  . atorvastatin (LIPITOR) 20 MG tablet TAKE 1 TABLET BY MOUTH DAILY. 90 tablet 3  . benzonatate (TESSALON) 200 MG capsule Take 1 capsule (200 mg total) by mouth 3 (three) times daily as needed for cough. 20 capsule 0  . furosemide (LASIX) 20 MG tablet TAKE 1 TABLET BY MOUTH ONCE DAILY AS NEEDED FOR FLUID RETENTION 30 tablet 3  . glucose blood (ONE TOUCH TEST STRIPS) test strip Use as instructed     . lisinopril-hydrochlorothiazide (PRINZIDE,ZESTORETIC) 20-25 MG tablet TAKE 1 TABLET BY MOUTH DAILY 90 tablet 1  . meloxicam (MOBIC) 15 MG tablet Take 1 tablet (15 mg total) by mouth daily. 90 tablet 1  . pantoprazole (PROTONIX) 40 MG tablet Take 40 mg by mouth daily.      No current facility-administered medications on file prior to visit.     PAST MEDICAL HISTORY: Past Medical History:  Diagnosis Date  . Abdominal pain, epigastric   . Anxiety   . Back pain   . Diabetes mellitus, type 2 ([place])   . Diverticulosis of colon without hemorrhage   . Duodenitis with hemorrhage   . Edema   . Fluid retention   . GERD (gastroesophageal reflux disease)   . Heart murmur   . Hyperlipidemia   . Hypertension   . Leg edema   . Mitral regurgitation   . Obesity, unspecified   . Osteopenia   .  Panic disorder without agoraphobia   . Peripheral neuropathy   . Personal history of malignant neoplasm of cervix uteri    CIN-3 1989, normal since, gets pap smears annually  . Personal history of other genital system and obstetric disorders(V13.29)   . Prediabetes   . Rash and other nonspecific skin eruption   . Reflux esophagitis   . Swallowing difficulty   . Unspecified hereditary and idiopathic peripheral neuropathy   . Unspecified sleep apnea    sleep study- [facility]  . Vitamin D deficiency     PAST SURGICAL HISTORY: Past Surgical History:  Procedure Laterality Date  . ABDOMINAL HYSTERECTOMY    . BREAST  CYST ASPIRATION     right , benign  . COLONOSCOPY WITH PROPOFOL N/A 06/09/2018   Procedure: COLONOSCOPY WITH PROPOFOL;  Surgeon: Lollie Sails, MD;  Location: [facility] ENDOSCOPY;  Service: Endoscopy;  Laterality: N/A;  . DILATION AND CURETTAGE OF UTERUS    . ESOPHAGOGASTRODUODENOSCOPY N/A 06/09/2018   Procedure: ESOPHAGOGASTRODUODENOSCOPY (EGD);  Surgeon: Lollie Sails, MD;  Location: [facility] ENDOSCOPY;  Service: Endoscopy;  Laterality: N/A;  . RIGHT OOPHORECTOMY  2001   menopause, endometriosis s/p.  HRT  since- has not tolerated previous attempts to wean    SOCIAL HISTORY: Social History   Tobacco Use  . Smoking status: Never Smoker  . Smokeless tobacco: Never Used  Substance Use Topics  . Alcohol use: Yes    Alcohol/week: 0.0 standard drinks    Comment: rare  . Drug use: No    FAMILY HISTORY: Family History  Problem Relation Age of Onset  . Osteopenia Mother   . Hypothyroidism Mother   . Diabetes Mother   . Hyperlipidemia Mother   . Hypertension Mother   . Obesity Mother   . Diabetes Father   . Alzheimer's disease Father   . Coronary artery disease Father   . Obesity Father   . Cancer Neg Hx        breast, ovarian, colon  . Breast cancer Neg Hx     ROS: Review of Systems  Constitutional: Positive for weight loss.  Cardiovascular: Negative for chest pain and claudication.  Gastrointestinal: Negative for nausea and vomiting.  Musculoskeletal: Negative for myalgias.       Negative muscle weakness  Endo/Heme/Allergies:       Negative hypoglycemia    PHYSICAL EXAM: Blood pressure 96/64, pulse 92, temperature 98.2 F (36.8 C), temperature source Oral, height 5\' 3"  (1.6 m), weight 200 lb (90.7 kg), SpO2 97 %. Body mass index is 35.43 kg/m. Physical Exam  Constitutional: She is oriented to person, place, and time. She appears well-developed and well-nourished.  Cardiovascular: Normal rate.  Pulmonary/Chest: Effort normal.  Musculoskeletal: Normal range of  motion.  Neurological: She is oriented to person, place, and time.  Skin: Skin is warm and dry.  Psychiatric: She has a normal mood and affect. Her behavior is normal.  Vitals reviewed.   RECENT LABS AND TESTS: BMET    Component Value Date/Time   NA 137 05/30/2018 1248   NA 139 04/18/2012 0931   K 4.3 05/30/2018 1248   K 4.1 04/18/2012 0931   CL 100 05/30/2018 1248   CL 106 04/18/2012 0931   CO2 20 05/30/2018 1248   CO2 23 04/18/2012 0931   GLUCOSE 112 (H) 05/30/2018 1248   GLUCOSE 160 (H) 02/15/2018 0905   GLUCOSE 105 (H) 04/18/2012 0931   BUN 19 05/30/2018 1248   BUN 13 04/18/2012 0931   CREATININE 0.71 05/30/2018  1248   CREATININE 0.63 04/18/2012 0931   CALCIUM 9.5 05/30/2018 1248   CALCIUM 8.9 04/18/2012 0931   GFRNONAA 92 05/30/2018 1248   GFRNONAA >60 04/18/2012 0931   GFRAA 106 05/30/2018 1248   GFRAA >60 04/18/2012 0931   Lab Results  Component Value Date   HGBA1C 6.4 (H) 05/30/2018   HGBA1C 6.6 (H) 02/15/2018   HGBA1C 6.0 08/10/2017   HGBA1C 6.1 02/11/2017   HGBA1C 5.8 11/12/2016   Lab Results  Component Value Date   INSULIN 20.8 05/30/2018   CBC    Component Value Date/Time   WBC 6.6 05/30/2018 1248   WBC 5.1 05/11/2013 0928   RBC 5.00 05/30/2018 1248   RBC 4.62 05/11/2013 0928   HGB 14.4 05/30/2018 1248   HCT 43.8 05/30/2018 1248   PLT 301.0 05/11/2013 0928   MCV 88 05/30/2018 1248   MCH 28.8 05/30/2018 1248   MCHC 32.9 05/30/2018 1248   MCHC 34.3 05/11/2013 0928   RDW 11.9 (L) 05/30/2018 1248   LYMPHSABS 2.2 05/30/2018 1248   MONOABS 0.5 05/11/2013 0928   EOSABS 0.1 05/30/2018 1248   BASOSABS 0.0 05/30/2018 1248   Iron/TIBC/Ferritin/ %Sat No results found for: IRON, TIBC, FERRITIN, IRONPCTSAT Lipid Panel     Component Value Date/Time   CHOL 165 05/30/2018 1248   TRIG 234 (H) 05/30/2018 1248   HDL 40 05/30/2018 1248   CHOLHDL 4 02/15/2018 0905   VLDL 37.0 02/15/2018 0905   LDLCALC 78 05/30/2018 1248   LDLDIRECT 97.0 08/11/2016  0853   Hepatic Function Panel     Component Value Date/Time   PROT 7.3 05/30/2018 1248   PROT 7.5 04/18/2012 0931   ALBUMIN 4.6 05/30/2018 1248   ALBUMIN 4.0 04/18/2012 0931   AST 22 05/30/2018 1248   AST 26 04/18/2012 0931   ALT 30 05/30/2018 1248   ALT 26 04/18/2012 0931   ALKPHOS 88 05/30/2018 1248   ALKPHOS 55 04/18/2012 0931   BILITOT 0.8 05/30/2018 1248   BILITOT 0.8 04/18/2012 0931      Component Value Date/Time   TSH 2.940 05/30/2018 1248   TSH 1.02 05/11/2013 0928  Results for Punches, Tulsi ANN (MRN [id number]) as of 08/29/2018 16:24  Ref. Range 05/30/2018 12:48  Vitamin D, 25-Hydroxy Latest Ref Range: 30.0 - 100.0 ng/mL 26.6 (L)    ASSESSMENT AND PLAN: Vitamin D deficiency - Plan: VITAMIN D 25 Hydroxy (Vit-D Deficiency, Fractures), Vitamin D, Ergocalciferol, (DRISDOL) 1.25 MG (50000 UT) CAPS capsule  Type 2 diabetes mellitus without complication, without long-term current use of insulin (HCC) - Plan: Comprehensive metabolic panel, Hemoglobin A1c, Insulin, random  Other hyperlipidemia - Plan: Lipid Panel With LDL/HDL Ratio  At risk for heart disease  Class 2 severe obesity with serious comorbidity and body mass index (BMI) of 35.0 to 35.9 in adult, unspecified obesity type (HCC)  Type 2 diabetes mellitus with diabetic neuropathy, without long-term current use of insulin (HCC) - Plan: metFORMIN (GLUCOPHAGE) 500 MG tablet  PLAN:  Vitamin D Deficiency Maressa was informed that low vitamin D levels contributes to fatigue and are associated with obesity, breast, and colon cancer. Sheri [name] agrees to continue taking prescription Vit D @50 ,000 IU every week #4 and we will refill for 1 month. She will follow up for routine testing of vitamin D, at least 2-3 times per year. She was informed of the risk of over-replacement of vitamin D and agrees to not increase her dose unless she discusses this with [name] first. We will check labs and  Sheri [name] agrees to follow up with our clinic in 3 to 4  weeks.  Diabetes II Sheri [name] has been given extensive diabetes education by myself today including ideal fasting and post-prandial blood glucose readings, individual ideal Hgb A1c goals and hypoglycemia prevention. We discussed the importance of good blood sugar control to decrease the likelihood of diabetic complications such as nephropathy, neuropathy, limb loss, blindness, coronary artery disease, and death. We discussed the importance of intensive lifestyle modification including diet, exercise and weight loss as the first line treatment for diabetes. Sheri [name] agrees to continue taking metformin 500 mg q AM #30 and we will refill for 1 month. We will check labs and Sheri [name] agrees to follow up with our clinic in 3 to 4 weeks   Hyperlipidemia Sheri [name] was informed of the American Heart Association Guidelines emphasizing intensive lifestyle modifications as the first line treatment for hyperlipidemia. We discussed many lifestyle modifications today in depth, and Sheri [name] will continue to work on decreasing saturated fats such as fatty red meat, butter and many fried foods. Sheri [name] agrees to continue taking Lipitor, and she will also increase vegetables and lean protein in her diet and continue to work on diet, exercise, and weight loss efforts. Sheri [name] agrees to follow up with our clinic in 3 to 4 weeks.  Cardiovascular risk counselling Sheri [name] was given extended (15 minutes) coronary artery disease prevention counseling today. She is 62 y.o. female and has risk factors for heart disease including obesity, diabetes II, and hyperlipidemia. We discussed intensive lifestyle modifications today with an emphasis on specific weight loss instructions and strategies. Pt was also informed of the importance of increasing exercise and decreasing saturated fats to help prevent heart disease.  Obesity Sheri [name] is currently in the action stage of change. As such, her goal is to continue with weight loss efforts She has agreed to follow the Category 3 plan Sheri [name] has  been instructed to work up to a goal of 150 minutes of combined cardio and strengthening exercise per week for weight loss and overall health benefits. We discussed the following Behavioral Modification Strategies today: work on meal planning and easy cooking plans, dealing with family or coworker sabotage, holiday eating strategies, and celebration eating strategies   Sheri [name] has agreed to follow up with our clinic in 3 to 4 weeks. She was informed of the importance of frequent follow up visits to maximize her success with intensive lifestyle modifications for her multiple health conditions.   OBESITY BEHAVIORAL INTERVENTION VISIT  Today's visit was # 6   Starting weight: 227 lbs Starting date: 05/30/18 Today's weight : 200 lbs Today's date: 08/29/2018 Total lbs lost to date: 90    ASK: We discussed the diagnosis of obesity with Sheri [name] today and Sheri [name] agreed to give [name] permission to discuss obesity behavioral modification therapy today.  ASSESS: Sheri [name] has the diagnosis of obesity and her BMI today is 35.44 Sheri [name] is in the action stage of change   ADVISE: Sheri [name] was educated on the multiple health risks of obesity as well as the benefit of weight loss to improve her health. She was advised of the need for long term treatment and the importance of lifestyle modifications to improve her current health and to decrease her risk of future health problems.  AGREE: Multiple dietary modification options and treatment options were discussed and  Sheri [name] agreed to follow the recommendations documented in the above note.  ARRANGE: Sheri [name] was educated on the importance of frequent visits to treat obesity as outlined  per CMS and USPSTF guidelines and agreed to schedule her next follow up appointment today.  I, Trixie Dredge, am acting as transcriptionist for Dennard Nip, MD  I have reviewed the above documentation for accuracy and completeness, and I agree with the above. -Dennard Nip, MD

## 2018-08-29 NOTE — Progress Notes (Signed)
Office: [phone]  /  Fax: [phone]   Date: August 29, 2018   Time Seen: 9:02am Duration: 30 minutes Provider: Glennie Isle, Psy.D. Type of Session: Individual Therapy   HPI: LuAnnwas referred by Dr. Rosemary Holms was seen for an initial appointment by this provider on June 14, 2018.Per the note for the initial visit withDr. Desmond Dike September 10,2019, Aleen Sells reported experiencing the following:significant food cravings issues; snackingfrequently in the evenings; skippingmeals frequently;frequently drinking liquids with calories;frequentlymakingpoor food choices; havingproblems with excessive hunger;frequentlyeatinglarger portions than normal;binge eating behaviors; and strugglingwith emotional eating.[name]'s Food and Mood (modified PHQ-9) score was26.In addition,Eduarda Annstarted gaining weight in the last 11 years andherheaviest weight ever was 234pounds.During the initial appointment with this provider,Alma Lelon Frohlich reported eating when she is stressed or upset about things. 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 Aleen Sells reported she binge eats when she is by herself. She noted her last binge was the week before she started with the clinic. Aleen Sells indicated that during her last binge episode she consumed two slicesof pizza, ajar of pickles, a large bag of Cheetos, and four scoops of ice cream over the course of four to five hours.Moreover, shedenied a history of purging and engaging in other compensatory strategies. Aleen Sells noted she has never been diagnosed with an eating disorder.Furthermore,LuAnnwas asked to complete a questionnaire assessing various behaviors related to emotional eating.LuAnnendorsed the following:experience food cravings on a regular basis, eat certain foods when you are anxious, stressed, depressed, or your feelings are hurt, use food to help you cope with emotional  situations, find food is comforting to you, overeat when you are angry or upset, overeat when you are worried about something, overeat frequently when you are bored or lonely, overeat when you are angry at someone just to show them they cannot control you and overeat when you are alone, but eat much less when you are with other people.During today's appointment,Leathie Anndid not report any emotional eating episodes.  Session Content: Session focused on the following treatment goal: decrease emotional eating. The session was initiated withthe administration of the PHQ-9 and GAD-7, as well as a brief check-in. Aleen Sells reported that things are going "okay;" however, expressed ongoing stress regarding her friend's health. She denied any episodes of emotional eating since the last appointment with this provider and discussed setting healthy boundaries with individuals in her life. Aleen Sells stated, "I feel empowered."  Regarding eating, Simrit Gohlke acknowledged experiencing emotional hunger yesterday secondary to interpersonal concerns, but noted she did not engage in eating. This provider engaged her in processing her associated thoughts and feelings. Brief psychoeducation regarding I statements for assertive communication was provided and Muriel Hannold was engaged in role playing to assist with this ongoing concern.This provider and Aleen Sells discussed termination planning, including the option of a referral for longer-term therapeutic services. Aleen Sells was receptive to today's session as evidenced by openness to sharing, responsiveness to feedback, and engagement in role play to practice assertive communication.   Mental Status Examination: Aleen Sells arrived on time for the appointment; however, the appointment was initiated late due to this provider. She presented as appropriately dressed and groomed. Aleen Sells appeared her stated age and demonstrated adequate orientation to time, place, person, and purpose of the appointment. She also  demonstrated appropriate eye contact. No psychomotor abnormalities or behavioral peculiarities noted. Her mood was euthymic with congruent affect. Her thought processes were logical, linear, and  goal-directed. No hallucinations, delusions, bizarre thinking or behavior reported or observed. Judgment, insight, and impulse control appeared to be grossly intact. There was no evidence of paraphasias (i.e., errors in speech, gross mispronunciations, and word substitutions), repetition deficits, or disturbances in volume or prosody (i.e., rhythm and intonation). There was no evidence of attention or memory impairments. Aleen Sells denied current suicidal and homicidal ideation, intent or plan.  Structured Assessment Results: The Patient Health Questionnaire-9 (PHQ-9) is a self-report measure that assesses symptoms and severity of depression over the course of the last two weeks. Aleen Sells obtained a score of zero. Depression screen PHQ 2/9 08/29/2018  Decreased Interest 0  Down, Depressed, Hopeless 0  PHQ - 2 Score 0  Altered sleeping 0  Tired, decreased energy 0  Change in appetite 0  Feeling bad or failure about yourself  0  Trouble concentrating 0  Moving slowly or fidgety/restless 0  Suicidal thoughts 0  PHQ-9 Score 0  Difficult doing work/chores -   The Generalized Anxiety Disorder-7 (GAD-7) is a brief self-report measure that assesses symptoms of anxiety over the course of the last two weeks. Aleen Sells obtained a score of 1 suggesting minimal anxiety. GAD 7 : Generalized Anxiety Score 08/29/2018  Nervous, Anxious, on Edge 0  Control/stop worrying 1  Worry too much - different things 0  Trouble relaxing 0  Restless 0  Easily annoyed or irritable 0  Afraid - awful might happen 0  Total GAD 7 Score 1  Anxiety Difficulty Not difficult at all   Interventions: Aleen Sells was administered the PHQ-9 and GAD-7 for symptom monitoring. Content from the last session was reviewed. Throughout today's session,  empathic reflections and validation were provided. This provider assisted Aleen Sells in processing her thoughts and feelings associated with current stressors. Psychoeducation regarding I-statements was provided and Adalei Novell was engaged in a role play to practice assertive communication.  DSM-5 Diagnosis: 296.32 (F33.1) Major Depressive Disorder, Recurrent Episode, Moderate, With Anxious Distress, Mild  Treatment Goal & Progress: Yanina Knupp was seen for an initial appointment with this provider on June 14, 2018 during which the following treatment goal was established: decrease emotional eating. Aleen Sells has demonstrated progress in her goal as evidenced by increased awareness of hunger patterns and triggers for emotional eating. She also continues to demonstrate willingness to engage in learned skills and denied engaging in emotional eating since last appointment with this provider.  Plan: Daanya Lanphier continues to appear able and willing to participate as evidenced by engagement in reciprocal conversation, and asking questions for clarification as appropriate. Due to ongoing progress based on Charolett Ann's self-report; the scores of the PHQ-9 and GAD-7; and the upcoming holidays, the next appointment will be scheduled in one month. The next session will focus on reviewing learned skills, and working towards the established treatment goal.

## 2018-08-30 LAB — COMPREHENSIVE METABOLIC PANEL
ALT: 24 IU/L (ref 0–32)
AST: 20 IU/L (ref 0–40)
Albumin/Globulin Ratio: 2 (ref 1.2–2.2)
Albumin: 4.6 g/dL (ref 3.6–4.8)
Alkaline Phosphatase: 72 IU/L (ref 39–117)
BILIRUBIN TOTAL: 0.8 mg/dL (ref 0.0–1.2)
BUN / CREAT RATIO: 26 (ref 12–28)
BUN: 18 mg/dL (ref 8–27)
CO2: 19 mmol/L — ABNORMAL LOW (ref 20–29)
CREATININE: 0.7 mg/dL (ref 0.57–1.00)
Calcium: 9.6 mg/dL (ref 8.7–10.3)
Chloride: 101 mmol/L (ref 96–106)
GFR calc non Af Amer: 93 mL/min/{1.73_m2} (ref >59)
GFR, EST AFRICAN AMERICAN: 107 mL/min/{1.73_m2} (ref >59)
Globulin, Total: 2.3 g/dL (ref 1.5–4.5)
Glucose: 102 mg/dL — ABNORMAL HIGH (ref 65–99)
POTASSIUM: 4 mmol/L (ref 3.5–5.2)
Sodium: 138 mmol/L (ref 134–144)
Total Protein: 6.9 g/dL (ref 6.0–8.5)

## 2018-08-30 LAB — LIPID PANEL WITH LDL/HDL RATIO
CHOLESTEROL TOTAL: 113 mg/dL (ref 100–199)
HDL: 36 mg/dL — AB (ref >39)
LDL Calculated: 57 mg/dL (ref 0–99)
LDL/HDL RATIO: 1.6 ratio (ref 0.0–3.2)
TRIGLYCERIDES: 100 mg/dL (ref 0–149)
VLDL CHOLESTEROL CAL: 20 mg/dL (ref 5–40)

## 2018-08-30 LAB — VITAMIN D 25 HYDROXY (VIT D DEFICIENCY, FRACTURES): Vit D, 25-Hydroxy: 63.9 ng/mL (ref 30.0–100.0)

## 2018-08-30 LAB — HEMOGLOBIN A1C
Est. average glucose Bld gHb Est-mCnc: 120 mg/dL
Hgb A1c MFr Bld: 5.8 % — ABNORMAL HIGH (ref 4.8–5.6)

## 2018-08-30 LAB — INSULIN, RANDOM: INSULIN: 11.8 u[IU]/mL (ref 2.6–24.9)

## 2018-09-25 NOTE — Progress Notes (Addendum)
Office: [phone]  /  Fax: [phone]    Date: September 26, 2018 Time Seen: 12:00pm Duration: 30 minutes Provider: Glennie Isle, Psy.D. Type of Session: Individual Therapy  Type of Contact: Face-to-face  HPI: LuAnnwas referred by Dr. Rosemary Holms was seen for an initial appointment by this provider on June 14, 2018.Per the note for the initial visit withDr. Desmond Dike September 10,2019, Sheri [name] reported experiencing the following:significant food cravings issues; snackingfrequently in the evenings; skippingmeals frequently;frequently drinking liquids with calories;frequentlymakingpoor food choices; havingproblems with excessive hunger;frequentlyeatinglarger portions than normal;binge eating behaviors; and strugglingwith emotional eating.Sheri [name]'s Food and Mood (modified PHQ-9) score was26.In addition,Sheri Annstarted gaining weight in the last 11 years andherheaviest weight ever was 234pounds.During the initial appointment with this provider,Sheri [name] reported eating when she is stressed or upset about things. 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 Sheri [name] reported she binge eats when she is by herself. She noted her last binge was the week before she started with the clinic. Sheri [name] indicated that during her last binge episode she consumed two slicesof pizza, ajar of pickles, a large bag of Cheetos, and four scoops of ice cream over the course of four to five hours.Moreover, shedenied a history of purging and engaging in other compensatory strategies. Sheri [name] noted she has never been diagnosed with an eating disorder.Furthermore,LuAnnwas asked to complete a questionnaire assessing various behaviors related to emotional eating.LuAnnendorsed the following:experience food cravings on a regular basis, eat certain foods when you are anxious, stressed, depressed, or your feelings are hurt, use food  to help you cope with emotional situations, find food is comforting to you, overeat when you are angry or upset, overeat when you are worried about something, overeat frequently when you are bored or lonely, overeat when you are angry at someone just to show them they cannot control you and overeat when you are alone, but eat much less when you are with other people.During today's appointment,Sheri Anndid not report any emotional eating episodes. During today's appointment, Sheri [name] reported she has not engaged in emotional eating since the last appointment.   Session Content: Session focused on the following treatment goal: decrease emotional eating. The session was initiated with the administration of the PHQ-9 and GAD-7, as well as a brief check-in. Sheri [name] shared about recent events, including the loss of loved ones. She shared she lost an additional four pounds since her last appointment. Regarding eating, Sheri [name] stated, "The eating is going fine." Despite emotional stressors, she denied engaging in emotional eating. Overall, she denied any emotional eating since the last appointment. Remainder of session focused on psychoeducation regarding mindfulness was provided. A handout was provided to Sheri [name] with further information regarding mindfulness, including exercises. This provider also explained the benefit of mindfulness as it relates to emotional eating. Sheri [name] was encouraged to engage in the provided exercises between now and the next appointment with this provider. Sheri [name] agreed. She was also led through an exercise involving her senses. Session concluded with further discussion regarding termination. Sheri [name] declined a referral for longer-term therapeutic services at this time, but requested to meet with this provider for an additional appointment following her husband's medical procedure. Sheri [name] was receptive to today's session as evidenced by openness to sharing, responsiveness to feedback, and engagement  in a mindfulness exercise.  Mental Status Examination: Sheri [name] arrived on time for the appointment. She presented as appropriately dressed and groomed.  Sheri [name] appeared her stated age and demonstrated adequate orientation to time, place, person, and purpose of the appointment. She also demonstrated appropriate eye contact. No psychomotor abnormalities or behavioral peculiarities noted. Her mood was euthymic with congruent affect. Her thought processes were logical, linear, and goal-directed. No hallucinations, delusions, bizarre thinking or behavior reported or observed. Judgment, insight, and impulse control appeared to be grossly intact. There was no evidence of paraphasias (i.e., errors in speech, gross mispronunciations, and word substitutions), repetition deficits, or disturbances in volume or prosody (i.e., rhythm and intonation). There was no evidence of attention or memory impairments. Sheri [name] denied current suicidal and homicidal ideation, intent or plan.  Structured Assessment Results: The Patient Health Questionnaire-9 (PHQ-9) is a self-report measure that assesses symptoms and severity of depression over the course of the last two weeks. Sheri [name] obtained a score of zero. Depression screen [facility] 2/9 09/26/2018  Decreased Interest 0  Down, Depressed, Hopeless 0  PHQ - 2 Score 0  Altered sleeping 0  Tired, decreased energy 0  Change in appetite 0  Feeling bad or failure about yourself  0  Trouble concentrating 0  Moving slowly or fidgety/restless 0  Suicidal thoughts 0  PHQ-9 Score 0  Difficult doing work/chores -   The Generalized Anxiety Disorder-7 (GAD-7) is a brief self-report measure that assesses symptoms of anxiety over the course of the last two weeks. Sheri [name] obtained a score of zero. GAD 7 : Generalized Anxiety Score 09/26/2018  Nervous, Anxious, on Edge 0  Control/stop worrying 0  Worry too much - different things 0  Trouble relaxing 0  Restless 0  Easily annoyed or irritable 0    Afraid - awful might happen 0  Total GAD 7 Score 0  Anxiety Difficulty -   Interventions:  Administration of PHQ-9 and GAD-7 for symptom monitoring Empathic reflections and validation Psychoeducation regarding mindfulness Mindfulness exercise Termination planning Brief chart review  DSM-5 Diagnosis: 296.32 (F33.1) Major Depressive Disorder, Recurrent Episode, Moderate, With Anxious Distress, Mild  Treatment Goal & Progress: During the initial appointment with this provider, the following treatment goal was established: decrease emotional eating. Sheri [name] has demonstrated progress in her goal as evidenced by increased awareness of hunger patterns and triggers for emotional eating. She also continues to demonstrate willingness to engage in learned skills. During this appointment, she again denied engaging in emotional eating since last appointment with this provider. There has also been a decrease in symptomatology per administration of the PHQ-9 and GAD-7.   Plan: Quantisha Marsicano continues to appear able and willing to participate as evidenced by engagement in reciprocal conversation, and asking questions for clarification as appropriate. Per Tiffay [name]'s request, the next appointment will be scheduled in three weeks. The next session will focus on reviewing learned skills, and termination.

## 2018-09-26 ENCOUNTER — Encounter (INDEPENDENT_AMBULATORY_CARE_PROVIDER_SITE_OTHER): Payer: Self-pay | Admitting: Family Medicine

## 2018-09-26 ENCOUNTER — Ambulatory Visit (INDEPENDENT_AMBULATORY_CARE_PROVIDER_SITE_OTHER): Payer: 59 | Admitting: Psychology

## 2018-09-26 ENCOUNTER — Ambulatory Visit (INDEPENDENT_AMBULATORY_CARE_PROVIDER_SITE_OTHER): Payer: 59 | Admitting: Family Medicine

## 2018-09-26 VITALS — BP 103/71 | HR 106 | Temp 98.0°F | Ht 63.0 in | Wt 196.0 lb

## 2018-09-26 DIAGNOSIS — E1140 Type 2 diabetes mellitus with diabetic neuropathy, unspecified: Secondary | ICD-10-CM | POA: Diagnosis not present

## 2018-09-26 DIAGNOSIS — F331 Major depressive disorder, recurrent, moderate: Secondary | ICD-10-CM | POA: Diagnosis not present

## 2018-09-26 DIAGNOSIS — Z9189 Other specified personal risk factors, not elsewhere classified: Secondary | ICD-10-CM

## 2018-09-26 DIAGNOSIS — E669 Obesity, unspecified: Secondary | ICD-10-CM | POA: Diagnosis not present

## 2018-09-26 DIAGNOSIS — Z6834 Body mass index (BMI) 34.0-34.9, adult: Secondary | ICD-10-CM

## 2018-09-26 DIAGNOSIS — E559 Vitamin D deficiency, unspecified: Secondary | ICD-10-CM | POA: Diagnosis not present

## 2018-09-26 DIAGNOSIS — R7303 Prediabetes: Secondary | ICD-10-CM

## 2018-09-26 MED ORDER — METFORMIN HCL 500 MG PO TABS: 500.0000 mg | ORAL_TABLET | Freq: Every day | ORAL | 0 refills | Status: DC

## 2018-09-26 MED ORDER — VITAMIN D (ERGOCALCIFEROL) 1.25 MG (50000 UNIT) PO CAPS: 50000.0000 [IU] | ORAL_CAPSULE | ORAL | 0 refills | Status: DC

## 2018-09-27 NOTE — Progress Notes (Signed)
Office: [phone]  /  Fax: [phone]   HPI:   Chief Complaint: OBESITY Sheri [name] is here to discuss her progress with her obesity treatment plan. She is on the Category 3 plan and is following her eating plan approximately 99.5 % of the time. She states she is exercising 0 minutes 0 times per week. Sheri [name] did very well with weight loss even over the holidays. She has had increased stress with her husband's worsening health, but is doing well avoiding temptations.  Her weight is 196 lb (88.9 kg) today and has had a weight loss of 4 pounds over a period of 4 weeks since her last visit. She has lost 31 lbs since starting treatment with [name].  Vitamin D deficiency Sheri [name] has a diagnosis of vitamin D deficiency. She is currently stable on vit D, but is not yet at goal. She denies nausea, vomiting, or muscle weakness.  Pre-Diabetes Sheri [name] has a diagnosis of pre-diabetes based on her elevated Hgb A1c and was informed this puts her at greater risk of developing diabetes. She is stable on her diet and metformin currently and continues to work on diet and exercise to decrease risk of diabetes. She denies nausea, vomiting,  or hypoglycemia.  At risk for cardiovascular disease Sheri [name] is at a higher than average risk for cardiovascular disease due to pre-diabetes and obesity. She currently denies any chest pain.  ASSESSMENT AND PLAN:  Vitamin D deficiency - Plan: Vitamin D, Ergocalciferol, (DRISDOL) 1.25 MG (50000 UT) CAPS capsule  Pre-diabetes  At risk for heart disease  Class 1 obesity with serious comorbidity and body mass index (BMI) of 34.0 to 34.9 in adult, unspecified obesity type  Type 2 diabetes mellitus with diabetic neuropathy, without long-term current use of insulin ([place]) - Plan: metFORMIN (GLUCOPHAGE) 500 MG tablet  PLAN:  Vitamin D Deficiency Shadow was informed that low vitamin D levels contributes to fatigue and are associated with obesity, breast, and colon cancer. She agrees to continue to take  prescription Vit D @50 ,000 IU every week #4 with no refills and will follow up for routine testing of vitamin D, at least 2-3 times per year. She was informed of the risk of over-replacement of vitamin D and agrees to not increase her dose unless she discusses this with [name] first. [name] agrees to follow up in 2 to 3 weeks.  Pre-Diabetes [name] will continue to work on weight loss, exercise, and decreasing simple carbohydrates in her diet to help decrease the risk of diabetes. She was informed that eating too many simple carbohydrates or too many calories at one sitting increases the likelihood of GI side effects. Sheri [name] agreed to continue metformin 500mg  with breakfast #30 with no refills and a prescription was written today. Sheri [name] agreed to follow up with [name] as directed to monitor her progress.  Cardiovascular risk counseling Sheri [name] was given extended (15 minutes) coronary artery disease prevention counseling today. She is 63 y.o. female and has risk factors for heart disease including pre-diabetes and obesity. We discussed intensive lifestyle modifications today with an emphasis on specific weight loss instructions and strategies. Pt was also informed of the importance of increasing exercise and decreasing saturated fats to help prevent heart disease.  Obesity January is currently in the action stage of change. As such, her goal is to continue with weight loss efforts. She has agreed to follow the Category 3 plan. Sheri [name] has been instructed to work up to a goal of 150 minutes of combined cardio and strengthening exercise  per week for weight loss and overall health benefits. We discussed the following Behavioral Modification Strategies today: increasing lean protein intake, decreasing simple carbohydrates, and dealing with family or coworker sabotage.  Sheri [name] has agreed to follow up with our clinic in 2 to 3 weeks. She was informed of the importance of frequent follow up visits to maximize her success with intensive lifestyle  modifications for her multiple health conditions.  ALLERGIES: No Known Allergies  MEDICATIONS: Current Outpatient Medications on File Prior to Visit  Medication Sig Dispense Refill  . ALPRAZolam (XANAX) 0.5 MG tablet TAKE ONE TABLET TWICE DAILY AS NEEDED FOR AXNITEY OR SLEEP 45 tablet 5  . amitriptyline (ELAVIL) 25 MG tablet Take 3 tablets (75 mg total) by mouth at bedtime. For neuropathy 270 tablet 1  . atorvastatin (LIPITOR) 20 MG tablet TAKE 1 TABLET BY MOUTH DAILY. 90 tablet 3  . benzonatate (TESSALON) 200 MG capsule Take 1 capsule (200 mg total) by mouth 3 (three) times daily as needed for cough. 20 capsule 0  . furosemide (LASIX) 20 MG tablet TAKE 1 TABLET BY MOUTH ONCE DAILY AS NEEDED FOR FLUID RETENTION 30 tablet 3  . glucose blood (ONE TOUCH TEST STRIPS) test strip Use as instructed     . lisinopril-hydrochlorothiazide (PRINZIDE,ZESTORETIC) 20-25 MG tablet TAKE 1 TABLET BY MOUTH DAILY 90 tablet 1  . meloxicam (MOBIC) 15 MG tablet Take 1 tablet (15 mg total) by mouth daily. 90 tablet 1  . pantoprazole (PROTONIX) 40 MG tablet Take 40 mg by mouth daily.      No current facility-administered medications on file prior to visit.     PAST MEDICAL HISTORY: Past Medical History:  Diagnosis Date  . Abdominal pain, epigastric   . Anxiety   . Back pain   . Diabetes mellitus, type 2 ([place])   . Diverticulosis of colon without hemorrhage   . Duodenitis with hemorrhage   . Edema   . Fluid retention   . GERD (gastroesophageal reflux disease)   . Heart murmur   . Hyperlipidemia   . Hypertension   . Leg edema   . Mitral regurgitation   . Obesity, unspecified   . Osteopenia   . Panic disorder without agoraphobia   . Peripheral neuropathy   . Personal history of malignant neoplasm of cervix uteri    CIN-3 1989, normal since, gets pap smears annually  . Personal history of other genital system and obstetric disorders(V13.29)   . Prediabetes   . Rash and other nonspecific skin eruption    . Reflux esophagitis   . Swallowing difficulty   . Unspecified hereditary and idiopathic peripheral neuropathy   . Unspecified sleep apnea    sleep study- [facility]  . Vitamin D deficiency     PAST SURGICAL HISTORY: Past Surgical History:  Procedure Laterality Date  . ABDOMINAL HYSTERECTOMY    . BREAST CYST ASPIRATION     right , benign  . COLONOSCOPY WITH PROPOFOL N/A 06/09/2018   Procedure: COLONOSCOPY WITH PROPOFOL;  Surgeon: Lollie Sails, MD;  Location: [facility] ENDOSCOPY;  Service: Endoscopy;  Laterality: N/A;  . DILATION AND CURETTAGE OF UTERUS    . ESOPHAGOGASTRODUODENOSCOPY N/A 06/09/2018   Procedure: ESOPHAGOGASTRODUODENOSCOPY (EGD);  Surgeon: Lollie Sails, MD;  Location: [facility] ENDOSCOPY;  Service: Endoscopy;  Laterality: N/A;  . RIGHT OOPHORECTOMY  2001   menopause, endometriosis s/p.  HRT  since- has not tolerated previous attempts to wean    SOCIAL HISTORY: Social History   Tobacco Use  . Smoking  status: Never Smoker  . Smokeless tobacco: Never Used  Substance Use Topics  . Alcohol use: Yes    Alcohol/week: 0.0 standard drinks    Comment: rare  . Drug use: No    FAMILY HISTORY: Family History  Problem Relation Age of Onset  . Osteopenia Mother   . Hypothyroidism Mother   . Diabetes Mother   . Hyperlipidemia Mother   . Hypertension Mother   . Obesity Mother   . Diabetes Father   . Alzheimer's disease Father   . Coronary artery disease Father   . Obesity Father   . Cancer Neg Hx        breast, ovarian, colon  . Breast cancer Neg Hx     ROS: Review of Systems  Cardiovascular: Negative for chest pain.  Gastrointestinal: Negative for nausea and vomiting.  Musculoskeletal:       Negative for muscle weakness.  Endo/Heme/Allergies:       Negative for hypoglycemia.    PHYSICAL EXAM: Blood pressure 103/71, pulse (!) 106, temperature 98 F (36.7 C), temperature source Oral, height 5\' 3"  (1.6 m), weight 196 lb (88.9 kg), SpO2 97 %. Body mass index  is 34.72 kg/m. Physical Exam Vitals signs reviewed.  Constitutional:      Appearance: Normal appearance. She is obese.  Cardiovascular:     Rate and Rhythm: Normal rate.  Pulmonary:     Effort: Pulmonary effort is normal.  Musculoskeletal: Normal range of motion.  Skin:    General: Skin is warm and dry.  Neurological:     Mental Status: She is alert and oriented to person, place, and time.  Psychiatric:        Mood and Affect: Mood normal.        Behavior: Behavior normal.     RECENT LABS AND TESTS: BMET    Component Value Date/Time   NA 138 08/29/2018 0916   NA 139 04/18/2012 0931   K 4.0 08/29/2018 0916   K 4.1 04/18/2012 0931   CL 101 08/29/2018 0916   CL 106 04/18/2012 0931   CO2 19 (L) 08/29/2018 0916   CO2 23 04/18/2012 0931   GLUCOSE 102 (H) 08/29/2018 0916   GLUCOSE 160 (H) 02/15/2018 0905   GLUCOSE 105 (H) 04/18/2012 0931   BUN 18 08/29/2018 0916   BUN 13 04/18/2012 0931   CREATININE 0.70 08/29/2018 0916   CREATININE 0.63 04/18/2012 0931   CALCIUM 9.6 08/29/2018 0916   CALCIUM 8.9 04/18/2012 0931   GFRNONAA 93 08/29/2018 0916   GFRNONAA >60 04/18/2012 0931   GFRAA 107 08/29/2018 0916   GFRAA >60 04/18/2012 0931   Lab Results  Component Value Date   HGBA1C 5.8 (H) 08/29/2018   HGBA1C 6.4 (H) 05/30/2018   HGBA1C 6.6 (H) 02/15/2018   HGBA1C 6.0 08/10/2017   HGBA1C 6.1 02/11/2017   Lab Results  Component Value Date   INSULIN 11.8 08/29/2018   INSULIN 20.8 05/30/2018   CBC    Component Value Date/Time   WBC 6.6 05/30/2018 1248   WBC 5.1 05/11/2013 0928   RBC 5.00 05/30/2018 1248   RBC 4.62 05/11/2013 0928   HGB 14.4 05/30/2018 1248   HCT 43.8 05/30/2018 1248   PLT 301.0 05/11/2013 0928   MCV 88 05/30/2018 1248   MCH 28.8 05/30/2018 1248   MCHC 32.9 05/30/2018 1248   MCHC 34.3 05/11/2013 0928   RDW 11.9 (L) 05/30/2018 1248   LYMPHSABS 2.2 05/30/2018 1248   MONOABS 0.5 05/11/2013 0928   EOSABS  0.1 05/30/2018 1248   BASOSABS 0.0  05/30/2018 1248   Iron/TIBC/Ferritin/ %Sat No results found for: IRON, TIBC, FERRITIN, IRONPCTSAT Lipid Panel     Component Value Date/Time   CHOL 113 08/29/2018 0916   TRIG 100 08/29/2018 0916   HDL 36 (L) 08/29/2018 0916   CHOLHDL 4 02/15/2018 0905   VLDL 37.0 02/15/2018 0905   LDLCALC 57 08/29/2018 0916   LDLDIRECT 97.0 08/11/2016 0853   Hepatic Function Panel     Component Value Date/Time   PROT 6.9 08/29/2018 0916   PROT 7.5 04/18/2012 0931   ALBUMIN 4.6 08/29/2018 0916   ALBUMIN 4.0 04/18/2012 0931   AST 20 08/29/2018 0916   AST 26 04/18/2012 0931   ALT 24 08/29/2018 0916   ALT 26 04/18/2012 0931   ALKPHOS 72 08/29/2018 0916   ALKPHOS 55 04/18/2012 0931   BILITOT 0.8 08/29/2018 0916   BILITOT 0.8 04/18/2012 0931      Component Value Date/Time   TSH 2.940 05/30/2018 1248   TSH 1.02 05/11/2013 0928   Results for Christy, Tanise ANN (MRN [id number]) as of 09/27/2018 08:25  Ref. Range 08/29/2018 09:16  Vitamin D, 25-Hydroxy Latest Ref Range: 30.0 - 100.0 ng/mL 63.9    OBESITY BEHAVIORAL INTERVENTION VISIT  Today's visit was # 7   Starting weight: 227 lbs Starting date: 05/30/18 Today's weight : Weight: 196 lb (88.9 kg)  Today's date: 09/26/2018 Total lbs lost to date: 58  ASK: We discussed the diagnosis of obesity with Aleen Sells Bruntz today and Jessy agreed to give [name] permission to discuss obesity behavioral modification therapy today.  ASSESS: Brityn has the diagnosis of obesity and her BMI today is 34.7. Estrella is in the action stage of change.   ADVISE: Giulietta was educated on the multiple health risks of obesity as well as the benefit of weight loss to improve her health. She was advised of the need for long term treatment and the importance of lifestyle modifications to improve her current health and to decrease her risk of future health problems.  AGREE: Multiple dietary modification options and treatment options were discussed and Marycarmen agreed to follow the recommendations  documented in the above note.  ARRANGE: Yoana was educated on the importance of frequent visits to treat obesity as outlined per CMS and USPSTF guidelines and agreed to schedule her next follow up appointment today.  I, Marcille Blanco, am acting as transcriptionist for Starlyn Skeans, MD  I have reviewed the above documentation for accuracy and completeness, and I agree with the above. -Dennard Nip, MD

## 2018-09-29 IMAGING — DX DG LUMBAR SPINE COMPLETE 4+V
5 series · 5 of 5 positions shown · non-contrast
Comparison: None.

CLINICAL DATA: Intermittent low back pain for the past several
weeks. No known injury.

EXAM:
LUMBAR SPINE - COMPLETE 4+ VIEW

[lumbar spine ap]
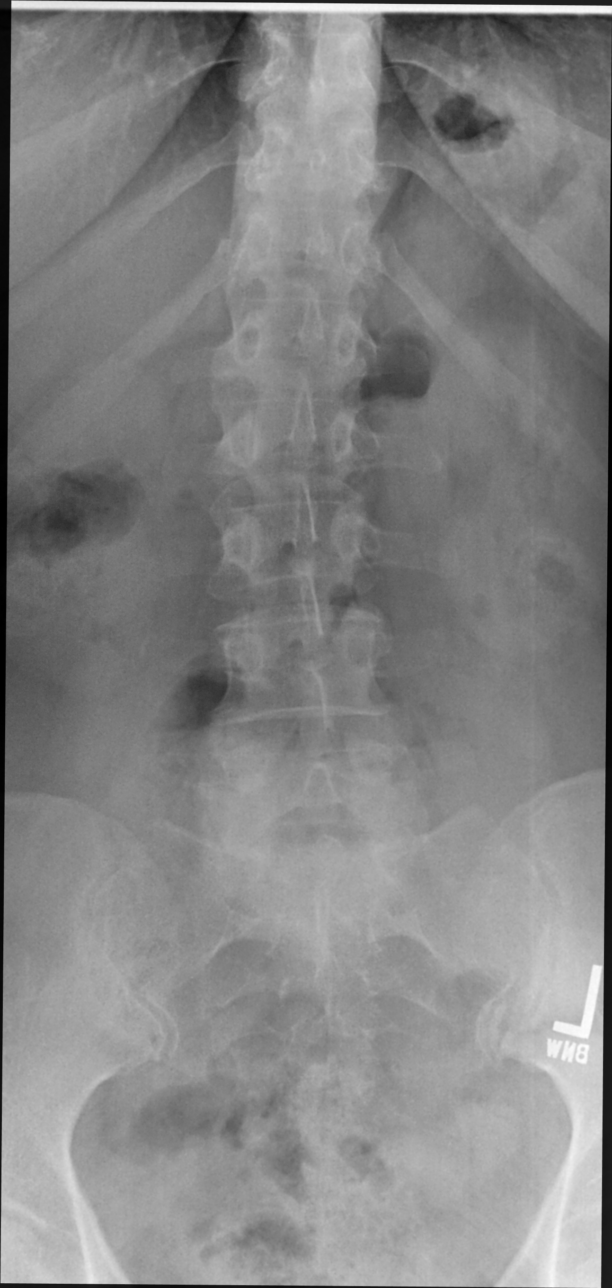

[lumbar spine obl (oblique) (1 of 2)]
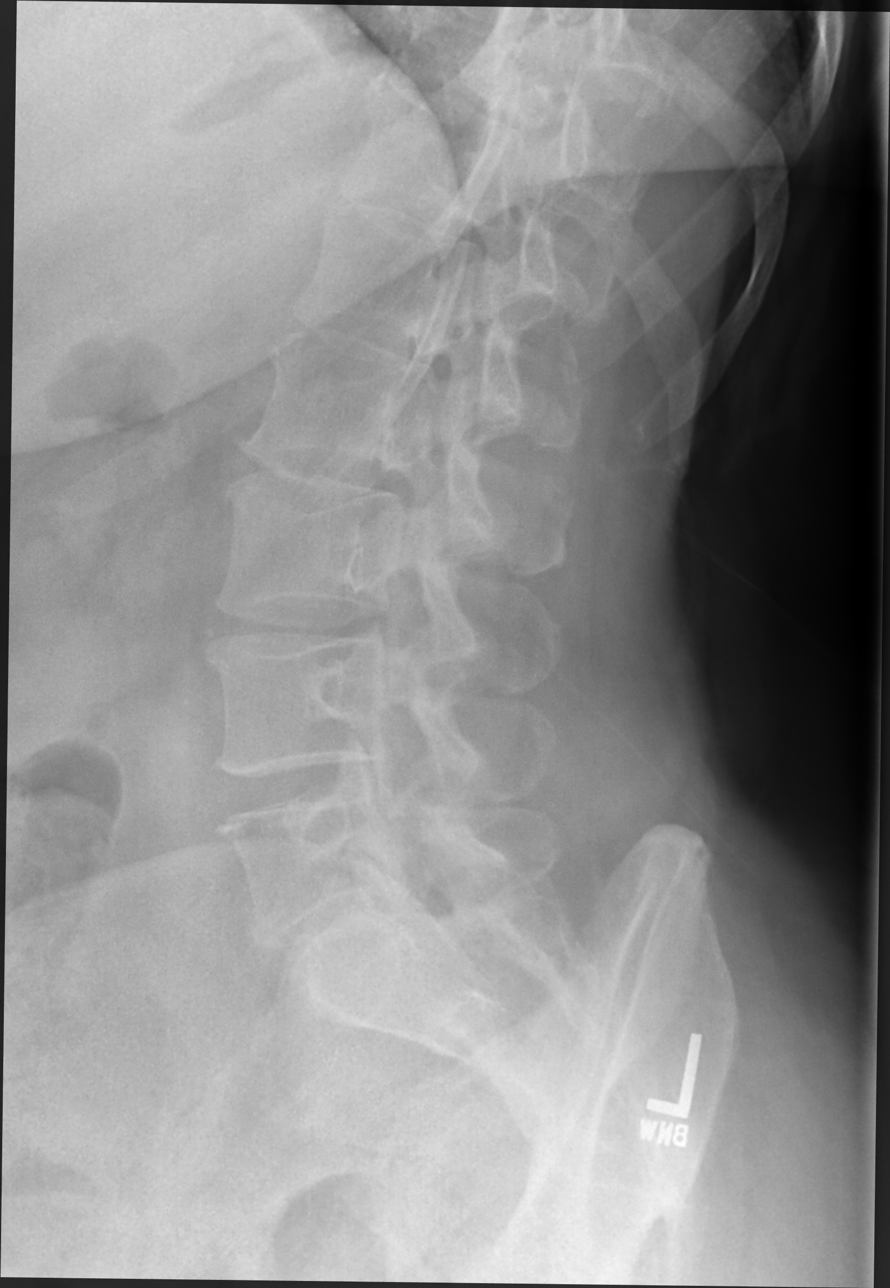

[lumbar spine obl (oblique) (2 of 2)]
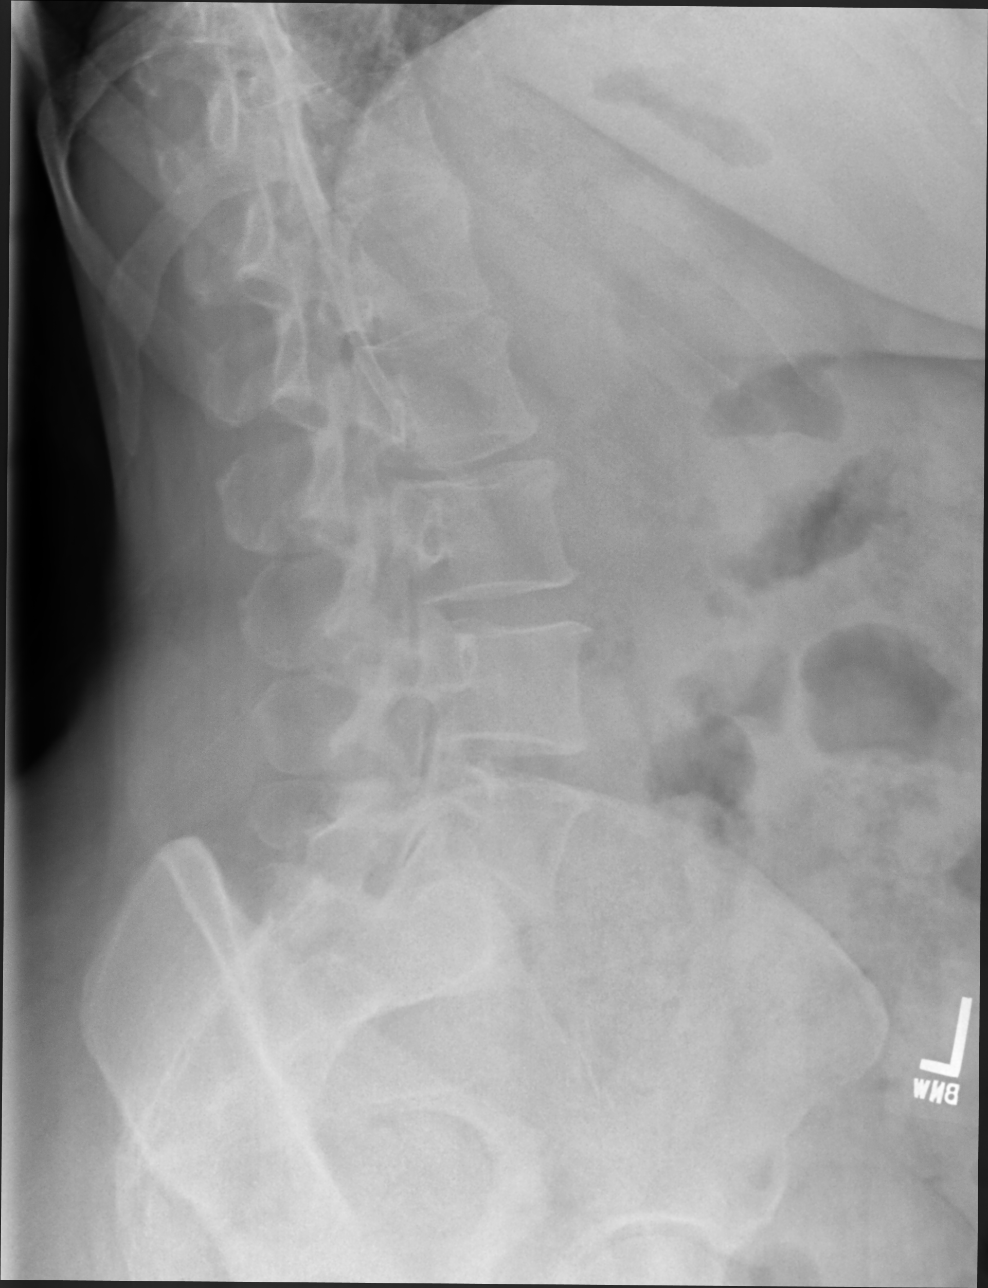

[lumbar spine lat]
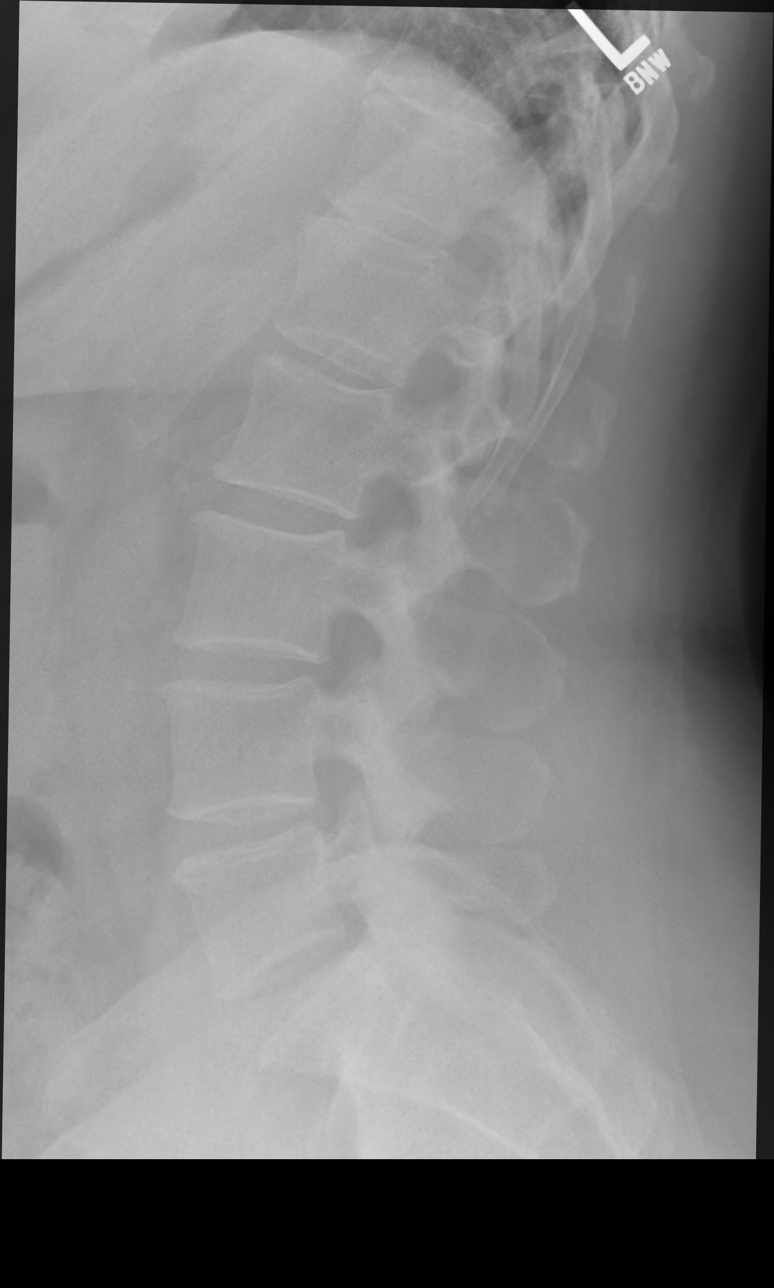

[lumbar spot lat]
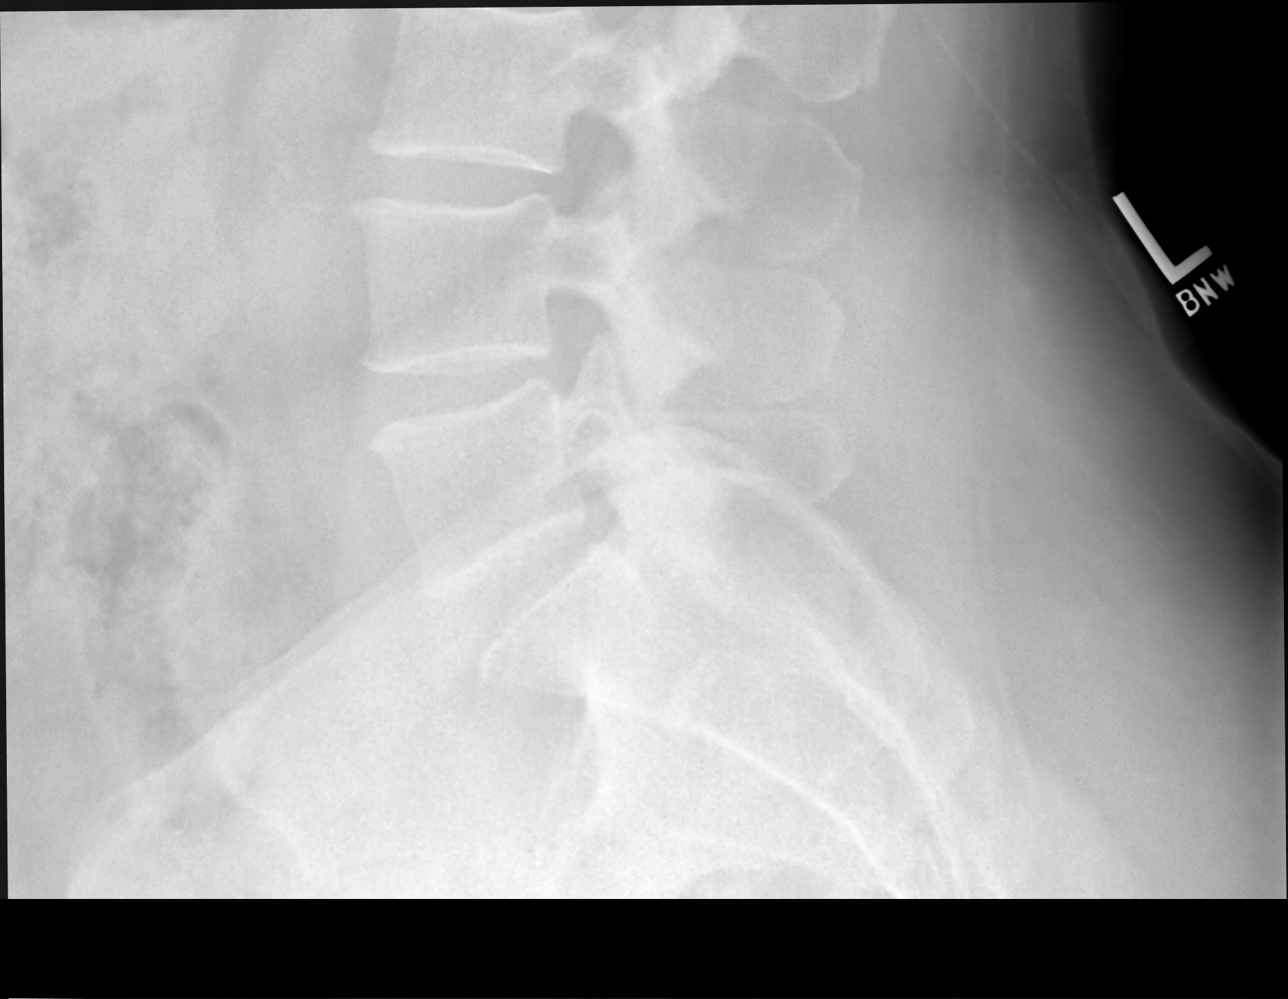

[5 of 5 positions shown; findings below may reference images not displayed]

FINDINGS: Five lumbar type vertebral bodies. No acute fracture or subluxation.
Vertebral body heights are preserved. Alignment is normal. Mild
multilevel anterior endplate spurring. Intervertebral disc spaces
are relatively maintained. Mild lower lumbar facet arthropathy. The
sacroiliac joints are intact.
IMPRESSION: Mild degenerative changes.  No acute osseous abnormality.

## 2018-10-04 ENCOUNTER — Other Ambulatory Visit: Payer: Self-pay | Admitting: Internal Medicine

## 2018-10-04 NOTE — Telephone Encounter (Signed)
Refilled: 04/19/2018 Last OV: 08/24/2018 Next OV: 02/23/2019

## 2018-10-06 ENCOUNTER — Ambulatory Visit (INDEPENDENT_AMBULATORY_CARE_PROVIDER_SITE_OTHER): Payer: Self-pay | Admitting: Physician Assistant

## 2018-10-06 ENCOUNTER — Encounter: Payer: Self-pay | Admitting: Physician Assistant

## 2018-10-06 VITALS — BP 130/90 | HR 106 | Temp 97.6°F | Resp 16 | Ht 62.0 in | Wt 202.0 lb

## 2018-10-06 DIAGNOSIS — R51 Headache: Secondary | ICD-10-CM

## 2018-10-06 DIAGNOSIS — R519 Headache, unspecified: Secondary | ICD-10-CM

## 2018-10-06 DIAGNOSIS — R220 Localized swelling, mass and lump, head: Secondary | ICD-10-CM

## 2018-10-06 MED ORDER — CLINDAMYCIN HCL 300 MG PO CAPS: 300.0000 mg | ORAL_CAPSULE | Freq: Three times a day (TID) | ORAL | 0 refills | Status: AC

## 2018-10-06 NOTE — Progress Notes (Addendum)
Patient ID: Sheri [name] DOB: [birth year] AGE: 63 y.o. MRN: [id number]   PCP: [name], MD   Chief Complaint:  Chief Complaint  Patient presents with  . Generalized Body Aches    x1d  . Facial Pain    today     Subjective:    HPI:  Sheri [name] is a 63 y.o. female presents for evaluation  Chief Complaint  Patient presents with  . Generalized Body Aches    x1d  . Facial Pain    today   63 year old female presents to [facility] with two day history of illness. Began yesterday. Reports fatigue and malaise. This morning woke up and felt like right side of face was swollen. Looked in the mirror, along right maxillary area/right cheek, her face was swollen. Very faint redness. Tenderness with palpation just right of nose, at most medial aspect of right maxillary area. Describes as sore/tender. No extension of edema to preauricular area, internal nose, lips, throat. Has taken OTC Tylenol (dose this morning, 2 pills) with no improvement. Denies fever, chills, sweats, headache, nasal congestion, rhinorrhea, sneezing, ear pain, sinus congestion, postnasal drip, sore throat, pain with swallowing, difficulty swallowing, swollen throat sensation, cough, chest pain, SOB, wheezing, chest pain, palpitations, nausea/vomiting, diarrhea, hives/urticaria.  No associated pruritis. No known new exposures; fabric softener, detergent, sheets, pillows, blankets, scarves, makeup.   Patient regularly followed by Dr. Duncan Dull MD with [facility]. Treated for hypertension (on Lasix and Lisinopril-HCTZ), DM2 with neuropathy (controlled on Metformin, last A1C 6.6 on 02/15/18, and Elavil), GERD (on Protonix), obesity (managed by [facility]), insomnia (on Restoril), and hyperlipdemia (on Atorvastatin). Patient also takes Meloxicam for low back pain; has not taken a dose in weeks.  Patient reports recent increase in stress due to husband with return  of atrial fibrillation (had resolved with previous ablation). Is now scheduled for another ablation this upcoming week, on 10/12/2018.  Patient denies previous history of smoking; cigarettes, marijuana, or e-cigarettes. Patient underwent CXR on 04/07/2018 for evaluation of persistent cough, no abnormalities seen. Patient denies recent flare-up/exacerbation of GERD; has been asymptomatic lately. Denies known autoimmune disease such as hypothyoidism, lupus, or rheumatoid arthritis. No previous history of dermatomyositis.   Patient seen by dentist every six months. Upcoming appointment in three weeks. No problems at last dental appointment. Patient with multiple crowns. Most recent crown placed 1-1/2 years ago. Denies previous history of dental abscess. Patient brushed her teeth this morning and flossed. Denies gum pain, edema, or purulent drainage. Denies current dental pain or pain with mastication.  A limited review of symptoms was performed, pertinent positives and negatives as mentioned in HPI.  The following portions of the patient's history were reviewed and updated as appropriate: allergies, current medications and past medical history.  Patient Active Problem List   Diagnosis Date Noted  . Cough 02/17/2018  . Lumbago syndrome 08/16/2017  . Encounter for preventive health examination 11/14/2016  . Vitamin D deficiency 08/15/2016  . Hypokalemia 10/05/2015  . Hyperlipidemia associated with type 2 diabetes mellitus (HCC) 09/24/2014  . Essential hypertension, benign 02/26/2014  . Edema 02/17/2014  . Obesity 05/13/2013  . Reflux esophagitis 05/11/2013  . Sinus tachycardia 04/24/2012  . Type 2 DM with diabetic neuropathy affecting both sides of body (HCC) 04/18/2012  . Insomnia, psychophysiological 05/17/2011    No Known Allergies  Current Outpatient Medications on File Prior to Visit  Medication Sig Dispense Refill  . ALPRAZolam (XANAX) 0.5 MG  tablet TAKE 1 TABLET BY MOUTH TWICE DAILY AS  NEEDED FOR ANXIETY OR SLEEP 45 tablet 5  . amitriptyline (ELAVIL) 25 MG tablet Take 3 tablets (75 mg total) by mouth at bedtime. For neuropathy 270 tablet 1  . atorvastatin (LIPITOR) 20 MG tablet TAKE 1 TABLET BY MOUTH DAILY. 90 tablet 3  . furosemide (LASIX) 20 MG tablet TAKE 1 TABLET BY MOUTH ONCE DAILY AS NEEDED FOR FLUID RETENTION 30 tablet 3  . lisinopril-hydrochlorothiazide (PRINZIDE,ZESTORETIC) 20-25 MG tablet TAKE 1 TABLET BY MOUTH DAILY 90 tablet 1  . meloxicam (MOBIC) 15 MG tablet Take 1 tablet (15 mg total) by mouth daily. 90 tablet 1  . metFORMIN (GLUCOPHAGE) 500 MG tablet Take 1 tablet (500 mg total) by mouth daily with breakfast. 30 tablet 0  . pantoprazole (PROTONIX) 40 MG tablet Take 40 mg by mouth daily.     . Vitamin D, Ergocalciferol, (DRISDOL) 1.25 MG (50000 UT) CAPS capsule Take 1 capsule (50,000 Units total) by mouth every 7 (seven) days. 4 capsule 0  . benzonatate (TESSALON) 200 MG capsule Take 1 capsule (200 mg total) by mouth 3 (three) times daily as needed for cough. (Patient not taking: Reported on 10/06/2018) 20 capsule 0   No current facility-administered medications on file prior to visit.        Objective:   Vitals:   10/06/18 0930  BP: 130/90  Pulse: (!) 106  Resp: 16  Temp: 97.6 F (36.4 C)  SpO2: 96%     Wt Readings from Last 3 Encounters:  10/06/18 202 lb (91.6 kg)  09/26/18 196 lb (88.9 kg)  08/29/18 200 lb (90.7 kg)    Physical Exam:   General Appearance:  Patient sitting comfortably on examination table. Conversational. Peri [place] self-historian. In no acute distress. Afebrile.   Head:  Facial inspection reveals mild/minimal edema over right maxillary area, most prominent at medial aspect. No loss of nasolabial fold. Very faint erythema. No palpable warmth. No palpable mass. No palpable induration or fluctuance. Mild tenderness with palpation along medial aspect of maxillary area, along zygomatic bone.  Eyes:  PERRL, no photosensitivity,  conjunctiva/corneas clear (edema on right of face with minimal extension to right lower eyelid), EOM's intact  Ears:  Bilateral ear canals WNL. No erythema or edema. No discharge/drainage. Bilateral TMs WNL. No erythema, injection, or serous effusion. No scar tissue.  Nose: Nares normal, septum midline. No discharge. Normal mucosa.  Throat: Lips, mucosa, and tongue normal; teeth and gums normal. No gum edema, erythema, bleeding or purulent drainage. Evidence of previous dental work. Right upper molar with crown. No tenderness with palpation/percussion along right upper teeth. Throat reveals no erythema. Soft palate with no edema or erythema. Tonsils with no enlargement or exudate. Uvula with no edema, erythema. Midline.  Neck: Supple, symmetrical, trachea midline, no adenopathy  Lungs:   Clear to auscultation bilaterally, respirations unlabored. Good aeration. No cough during examination. No wheezing, rales, rhonchi, or crackles. 96% pulse ox.  Heart:  Regular rate and rhythm (106bpm; same heart rate at last dr.'s appt on 09/26/2018), S1 and S2 normal. Patient with documentation of mitral regurgitation and cardiac murmur; not audible to this provider today (not mentioned on several of patient's last OV notes with PCP).  Extremities: Extremities normal, atraumatic, no cyanosis or edema  Pulses: 2+ and symmetric  Skin: Skin color, texture, turgor normal, no rashes or lesions. No hives/urticaria.  Lymph nodes: Cervical, supraclavicular, and axillary nodes normal  Neurologic: Normal    Assessment & Plan:  Exam findings, diagnosis etiology and medication use and indications reviewed with patient. Follow-Up and discharge instructions provided. No emergent/urgent issues found on exam.  Patient education was provided.   Patient verbalized understanding of information provided and agrees with plan of care (POC), all questions answered. The patient is advised to call or return to clinic if condition does  not see an improvement in symptoms, or to seek the care of the closest emergency department if condition worsens with the below plan.    1. Right facial swelling  - clindamycin (CLEOCIN) 300 MG capsule; Take 1 capsule (300 mg total) by mouth 3 (three) times daily for 7 days.  Dispense: 21 capsule; Refill: 0  2. Right sided facial pain  Picture of face in media section, attached via Haiku.  Patient with one day history of right sided facial edema; associated very minimal erythema and tenderness at medial aspect of zygomatic bone. Extensive differential; given dental history, suspect dental abscess. Possible sialadenitis or facial cellulitis. Prescribed Clindamycin 300mg  tid x 7 days. Discussed more concerning causes including angioedema, dermatomyositis secondary to autoimmune disorder, superior vena cava syndrome (patient with recent history of chronic cough, denies cough currently, clear lung sounds, no smoking history), etc. Advised patient maintain dentist appointment in three weeks; sooner if dental pain develops. Advised patient go immediately to the ED with any worsening symptoms including but not limited to fever, worsening facial swelling, difficulty swallowing, difficulty breathing, chest pain. Advised patient follow-up with PCP, urgent care, or [facility] if minimal improvement in 2-3 days. Patient agreed with plan.      Janalyn Harder, MHS, PA-C Rulon Sera, MHS, PA-C Advanced Practice Provider [facility]  [address] [facility], [address] [place], [place] [address] (p):  [phone] [email or web address]

## 2018-10-06 NOTE — Patient Instructions (Addendum)
Thank you for choosing [facility] for your health care needs.  You have been diagnosed with right sided facial swelling and pain.  You have been prescribed an antibiotic, Clindamycin. Take 1 tablet (300mg ) three times a day x 7 days. Take with food to prevent stomach upset. Eat yogurt or take an over-the-counter probiotic (such as Acidophilus) to replace the body's good bacteria. May use over the counter Imodium if you develop diarrhea.  Apply ice, 15-20 minutes at a time, to the right side of the face to help with pain and swelling. May take over the counter Tylenol or ibuprofen for pain/swelling.  Follow-up with dentist in 3 weeks as scheduled. Follow-up with dentist sooner if you develop dental pain or gum bleeding/purulent (pus) drainage.  Go directly to the ED if you develop fever, worsening swelling, development of worsening redness, difficulty swallowing, difficulty breathing, chest pain, hives, or other new/concerning symptom.  May call or return to [facility] in 2-3 days if you feel symptoms are not improving. Hope you feel better soon!

## 2018-10-10 ENCOUNTER — Telehealth: Payer: Self-pay | Admitting: Emergency Medicine

## 2018-10-10 NOTE — Progress Notes (Signed)
Office: [phone]  /  Fax: [phone]    Date: October 17, 2018   Time Seen: 9:32am Duration: 26 minutes Provider: Glennie Isle, Psy.D. Type of Session: Individual Therapy  Type of Contact: Face-to-face  Session Content: Sheri [name] is a 63 year old female presenting for a follow-up appointment to address the previously established treatment goal of decreasing emotional eating and focus on termination with this provider. The session was initiated with the administration of the PHQ-9 and GAD-7, as well as a brief check-in. Sheri [name] shared her husband's procedure went well, but discussed ongoing interpersonal stressors. She also shared she was recently experiencing pain on the right side of her face and will be having a root canal tomorrow. As such, this provider recommended she speak with Dr. Leafy Ro regarding eating after the root canal; Sheri [name] agreed. Consequent to ongoing stressors, Sheri [name] indicated eating [employer] and she continued engaging in emotional eating the following day. Since then she has resumed her meal plan, but expressed worry regarding her ability to continue being successful with her weight loss. Thus, this provider assisted in processing associated thoughts and feelings, and brief psychoeducation regarding healthy weight loss was provided. Regarding mindfulness, Sheri [name] indicated she practiced since the last appointment. Two handouts for additional mindfulness exercises were provided, and this provider also discussed the utilization of YouTube for mindfulness exercises. Sheri [name] was receptive to today's session as evidenced by openness to sharing, responsiveness to feedback, and willingness to continue engaging in learned skills.  Mental Status Examination: Sheri [name] on time for the appointment. She presented as appropriately dressed and groomed. Sheri [name] appeared her stated age and demonstrated adequate orientation to time, place, person, and purpose of the appointment. She also  demonstrated appropriate eye contact. No psychomotor abnormalities or behavioral peculiarities noted. Her mood was euthymic with congruent affect. Her thought processes were logical, linear, and goal-directed. No hallucinations, delusions, bizarre thinking or behavior reported or observed. Judgment, insight, and impulse control appeared to be grossly intact. There was no evidence of paraphasias (i.e., errors in speech, gross mispronunciations, and word substitutions), repetition deficits, or disturbances in volume or prosody (i.e., rhythm and intonation). There was no evidence of attention or memory impairments. Sheri [name] current suicidal and homicidal ideation, plan and intent.   Structured Assessment Results: The Patient Health Questionnaire-9 (PHQ-9) is a self-report measure that assesses symptoms and severity of depression over the course of the last two weeks. Sheri [name] a score of zero. Depression screen Sheri [name] 2/9 10/17/2018  Decreased Interest 0  Down, Depressed, Hopeless 0  PHQ - 2 Score 0  Altered sleeping 0  Tired, decreased energy 0  Change in appetite 0  Feeling bad or failure about yourself  0  Trouble concentrating 0  Moving slowly or fidgety/restless 0  Suicidal thoughts 0  PHQ-9 Score 0  Difficult doing work/chores -   The Generalized Anxiety Disorder-7 (GAD-7) is a brief self-report measure that assesses symptoms of anxiety over the course of the last two weeks. Sheri [name] a score of 4 suggesting minimal anxiety. GAD 7 : Generalized Anxiety Score 10/17/2018  Nervous, Anxious, on Edge 1  Control/stop worrying 0  Worry too much - different things 1  Trouble relaxing 1  Restless 0  Easily annoyed or irritable 1  Afraid - awful might happen 0  Total GAD 7 Score 4  Anxiety Difficulty Not difficult at all   Interventions:  Administration of PHQ-9 and GAD-7 for symptom monitoring Review of  content from the previous session Empathic reflections and  validation Processing thoughts and feelings Psychoeducation regarding mindfulness Discussed option for a referral for longer-term therapeutic services Brief chart review  Psychoeducation regarding healthy weight loss   DSM-5 Diagnosis: 296.32 (F33.1) Major Depressive Disorder, Recurrent Episode, Moderate, With Anxious Distress, Mild  Treatment Goal & Progress: During the initial appointment with this provider, the following treatment goal was established: decrease emotional eating. Sheri [name] has demonstrated progress in her goal as evidenced by increased awareness of hunger patterns and triggers for emotional eating. Despite recent emotional eating episodes, Sheri [name] resumed her meal plan and continues to demonstrate willingness to engage in learned skills.   Plan: Today was Sheri [name]'s last appointment with this provider. She declined referral for longer-term therapeutic services, as she feels she has the skills needed at this time. Nevertheless, Sheri [name] acknowledged understanding that she may request a referral in the future if needed.

## 2018-10-10 NOTE — Telephone Encounter (Signed)
Left message follow up call from visit  With [facility].

## 2018-10-17 ENCOUNTER — Ambulatory Visit (INDEPENDENT_AMBULATORY_CARE_PROVIDER_SITE_OTHER): Payer: 59 | Admitting: Family Medicine

## 2018-10-17 ENCOUNTER — Ambulatory Visit (INDEPENDENT_AMBULATORY_CARE_PROVIDER_SITE_OTHER): Payer: 59 | Admitting: Psychology

## 2018-10-17 ENCOUNTER — Encounter (INDEPENDENT_AMBULATORY_CARE_PROVIDER_SITE_OTHER): Payer: Self-pay | Admitting: Family Medicine

## 2018-10-17 VITALS — BP 106/71 | HR 79 | Temp 98.1°F | Ht 63.0 in | Wt 198.0 lb

## 2018-10-17 DIAGNOSIS — E559 Vitamin D deficiency, unspecified: Secondary | ICD-10-CM

## 2018-10-17 DIAGNOSIS — F331 Major depressive disorder, recurrent, moderate: Secondary | ICD-10-CM

## 2018-10-17 DIAGNOSIS — E1140 Type 2 diabetes mellitus with diabetic neuropathy, unspecified: Secondary | ICD-10-CM | POA: Diagnosis not present

## 2018-10-17 DIAGNOSIS — Z9189 Other specified personal risk factors, not elsewhere classified: Secondary | ICD-10-CM | POA: Diagnosis not present

## 2018-10-17 DIAGNOSIS — Z6835 Body mass index (BMI) 35.0-35.9, adult: Secondary | ICD-10-CM | POA: Diagnosis not present

## 2018-10-17 MED ORDER — METFORMIN HCL 500 MG PO TABS: 500.0000 mg | ORAL_TABLET | Freq: Every day | ORAL | 0 refills | Status: DC

## 2018-10-17 MED ORDER — VITAMIN D (ERGOCALCIFEROL) 1.25 MG (50000 UNIT) PO CAPS: 50000.0000 [IU] | ORAL_CAPSULE | ORAL | 0 refills | Status: DC

## 2018-10-17 NOTE — Progress Notes (Signed)
Office: [phone]  /  Fax: [phone]   HPI:   Chief Complaint: OBESITY Sheri [name] is here to discuss her progress with her obesity treatment plan. She is on the Category 3 plan and is following her eating plan approximately 85 % of the time. She states she is exercising 0 minutes 0 times per week. Sheri [name] is currently struggling with has had a lot of stressors with her husbands health and her own health issues. She has struggled to plan meals during this time.  Her weight is 198 lb (89.8 kg) today and has had a weight gain of 2 pounds over a period of 3 weeks since her last visit. She has lost 29 lbs since starting treatment with [name].  Pre-Diabetes Sheri [name] has a diagnosis of pre-diabetes based on her elevated Hgb A1c and was informed this puts her at greater risk of developing diabetes. She is stable on metformin currently and is struggling more with her diet lately. She continues to work on diet and exercise to decrease risk of diabetes. She denies nausea, vomiting, or hypoglycemia.  At risk for cardiovascular disease Sheri [name] is at a higher than average risk for cardiovascular disease due to diabetes and obesity. She currently denies any chest pain.  Vitamin D deficiency Sheri [name] has a diagnosis of vitamin D deficiency. She is currently stable on vit D, but is not yet at goal. She denies nausea, vomiting, or muscle weakness.  ASSESSMENT AND PLAN:  Type 2 diabetes mellitus with diabetic neuropathy, without long-term current use of insulin (Sheri [name]) - Plan: metFORMIN (GLUCOPHAGE) 500 MG tablet  Vitamin D deficiency - Plan: Vitamin D, Ergocalciferol, (DRISDOL) 1.25 MG (50000 UT) CAPS capsule  At risk for heart disease  Class 2 severe obesity with serious comorbidity and body mass index (BMI) of 35.0 to 35.9 in adult, unspecified obesity type (Sheri [name])  PLAN:  Pre-Diabetes Sheri [name] will continue to work on weight loss, exercise, and decreasing simple carbohydrates in her diet to help decrease the risk of diabetes.  She  was informed that eating too many simple carbohydrates or too many calories at one sitting increases the likelihood of GI side effects. Sheri [name] agreed to continue metformin 500mg  with breakfast qd #30 with no refills and a prescription was written today. Sheri [name] agreed to follow up with [name] as directed to monitor her progress in 4 weeks.  Cardiovascular risk counseling Sheri [name] was given extended (15 minutes) coronary artery disease prevention counseling today. She is 63 y.o. female and has risk factors for heart disease including diabetes and obesity. We discussed intensive lifestyle modifications today with an emphasis on specific weight loss instructions and strategies. Pt was also informed of the importance of increasing exercise and decreasing saturated fats to help prevent heart disease.  Vitamin D Deficiency Sheri [name] was informed that low vitamin D levels contributes to fatigue and are associated with obesity, breast, and colon cancer. She agrees to continue to take prescription Vit D @50 ,000 IU every week #4 with no refills and will follow up for routine testing of vitamin D, at least 2-3 times per year. She was informed of the risk of over-replacement of vitamin D and agrees to not increase her dose unless she discusses this with [name] first. Sheri [name] agrees to follow up as directed.  Obesity Sheri [name] is currently in the action stage of change. As such, her goal is to continue with weight loss efforts. She has agreed to follow the Category 3 plan. Sheri [name] has been instructed to work up to a goal of  150 minutes of combined cardio and strengthening exercise per week for weight loss and overall health benefits. We discussed the following Behavioral Modification Strategies today: increasing lean protein intake, decreasing simple carbohydrates, increasing vegetables, better snacking choices, and work on meal planning and easy cooking plans.  Sheri [name] has agreed to follow up with our clinic in 4 weeks. She was informed of the importance of  frequent follow up visits to maximize her success with intensive lifestyle modifications for her multiple health conditions.  ALLERGIES: No Known Allergies  MEDICATIONS: Current Outpatient Medications on File Prior to Visit  Medication Sig Dispense Refill  . ALPRAZolam (XANAX) 0.5 MG tablet TAKE 1 TABLET BY MOUTH TWICE DAILY AS NEEDED FOR ANXIETY OR SLEEP 45 tablet 5  . amitriptyline (ELAVIL) 25 MG tablet Take 3 tablets (75 mg total) by mouth at bedtime. For neuropathy 270 tablet 1  . atorvastatin (LIPITOR) 20 MG tablet TAKE 1 TABLET BY MOUTH DAILY. 90 tablet 3  . benzonatate (TESSALON) 200 MG capsule Take 1 capsule (200 mg total) by mouth 3 (three) times daily as needed for cough. 20 capsule 0  . furosemide (LASIX) 20 MG tablet TAKE 1 TABLET BY MOUTH ONCE DAILY AS NEEDED FOR FLUID RETENTION 30 tablet 3  . lisinopril-hydrochlorothiazide (PRINZIDE,ZESTORETIC) 20-25 MG tablet TAKE 1 TABLET BY MOUTH DAILY 90 tablet 1  . meloxicam (MOBIC) 15 MG tablet Take 1 tablet (15 mg total) by mouth daily. 90 tablet 1  . pantoprazole (PROTONIX) 40 MG tablet Take 40 mg by mouth daily.      No current facility-administered medications on file prior to visit.     PAST MEDICAL HISTORY: Past Medical History:  Diagnosis Date  . Abdominal pain, epigastric   . Anxiety   . Back pain   . Diabetes mellitus, type 2 ([place])   . Diverticulosis of colon without hemorrhage   . Duodenitis with hemorrhage   . Edema   . Fluid retention   . GERD (gastroesophageal reflux disease)   . Heart murmur   . Hyperlipidemia   . Hypertension   . Leg edema   . Mitral regurgitation   . Obesity, unspecified   . Osteopenia   . Panic disorder without agoraphobia   . Peripheral neuropathy   . Personal history of malignant neoplasm of cervix uteri    CIN-3 1989, normal since, gets pap smears annually  . Personal history of other genital system and obstetric disorders(V13.29)   . Prediabetes   . Rash and other nonspecific skin  eruption   . Reflux esophagitis   . Swallowing difficulty   . Unspecified hereditary and idiopathic peripheral neuropathy   . Unspecified sleep apnea    sleep study- [facility]  . Vitamin D deficiency     PAST SURGICAL HISTORY: Past Surgical History:  Procedure Laterality Date  . ABDOMINAL HYSTERECTOMY    . BREAST CYST ASPIRATION     right , benign  . COLONOSCOPY WITH PROPOFOL N/A 06/09/2018   Procedure: COLONOSCOPY WITH PROPOFOL;  Surgeon: Lollie Sails, MD;  Location: [facility] ENDOSCOPY;  Service: Endoscopy;  Laterality: N/A;  . DILATION AND CURETTAGE OF UTERUS    . ESOPHAGOGASTRODUODENOSCOPY N/A 06/09/2018   Procedure: ESOPHAGOGASTRODUODENOSCOPY (EGD);  Surgeon: Lollie Sails, MD;  Location: [facility] ENDOSCOPY;  Service: Endoscopy;  Laterality: N/A;  . RIGHT OOPHORECTOMY  2001   menopause, endometriosis s/p.  HRT  since- has not tolerated previous attempts to wean    SOCIAL HISTORY: Social History   Tobacco Use  . Smoking status:  Never Smoker  . Smokeless tobacco: Never Used  Substance Use Topics  . Alcohol use: Yes    Alcohol/week: 0.0 standard drinks    Comment: rare  . Drug use: No    FAMILY HISTORY: Family History  Problem Relation Age of Onset  . Osteopenia Mother   . Hypothyroidism Mother   . Diabetes Mother   . Hyperlipidemia Mother   . Hypertension Mother   . Obesity Mother   . Diabetes Father   . Alzheimer's disease Father   . Coronary artery disease Father   . Obesity Father   . Cancer Neg Hx        breast, ovarian, colon  . Breast cancer Neg Hx     ROS: Review of Systems  Constitutional: Negative for weight loss.  Cardiovascular: Negative for chest pain.  Gastrointestinal: Negative for nausea and vomiting.  Musculoskeletal:       Negative for muscle weakness.  Endo/Heme/Allergies:       Negative for hypoglycemia.    PHYSICAL EXAM: Blood pressure 106/71, pulse 79, temperature 98.1 F (36.7 C), temperature source Oral, height 5\' 3"  (1.6 m),  weight 198 lb (89.8 kg), SpO2 97 %. Body mass index is 35.07 kg/m. Physical Exam Vitals signs reviewed.  Constitutional:      Appearance: Normal appearance. She is obese.  Cardiovascular:     Rate and Rhythm: Normal rate.  Pulmonary:     Effort: Pulmonary effort is normal.  Musculoskeletal: Normal range of motion.  Skin:    General: Skin is warm and dry.  Neurological:     Mental Status: She is alert and oriented to person, place, and time.  Psychiatric:        Mood and Affect: Mood normal.        Behavior: Behavior normal.     RECENT LABS AND TESTS: BMET    Component Value Date/Time   NA 138 08/29/2018 0916   NA 139 04/18/2012 0931   K 4.0 08/29/2018 0916   K 4.1 04/18/2012 0931   CL 101 08/29/2018 0916   CL 106 04/18/2012 0931   CO2 19 (L) 08/29/2018 0916   CO2 23 04/18/2012 0931   GLUCOSE 102 (H) 08/29/2018 0916   GLUCOSE 160 (H) 02/15/2018 0905   GLUCOSE 105 (H) 04/18/2012 0931   BUN 18 08/29/2018 0916   BUN 13 04/18/2012 0931   CREATININE 0.70 08/29/2018 0916   CREATININE 0.63 04/18/2012 0931   CALCIUM 9.6 08/29/2018 0916   CALCIUM 8.9 04/18/2012 0931   GFRNONAA 93 08/29/2018 0916   GFRNONAA >60 04/18/2012 0931   GFRAA 107 08/29/2018 0916   GFRAA >60 04/18/2012 0931   Lab Results  Component Value Date   HGBA1C 5.8 (H) 08/29/2018   HGBA1C 6.4 (H) 05/30/2018   HGBA1C 6.6 (H) 02/15/2018   HGBA1C 6.0 08/10/2017   HGBA1C 6.1 02/11/2017   Lab Results  Component Value Date   INSULIN 11.8 08/29/2018   INSULIN 20.8 05/30/2018   CBC    Component Value Date/Time   WBC 6.6 05/30/2018 1248   WBC 5.1 05/11/2013 0928   RBC 5.00 05/30/2018 1248   RBC 4.62 05/11/2013 0928   HGB 14.4 05/30/2018 1248   HCT 43.8 05/30/2018 1248   PLT 301.0 05/11/2013 0928   MCV 88 05/30/2018 1248   MCH 28.8 05/30/2018 1248   MCHC 32.9 05/30/2018 1248   MCHC 34.3 05/11/2013 0928   RDW 11.9 (L) 05/30/2018 1248   LYMPHSABS 2.2 05/30/2018 1248   MONOABS 0.5 05/11/2013  0928    EOSABS 0.1 05/30/2018 1248   BASOSABS 0.0 05/30/2018 1248   Iron/TIBC/Ferritin/ %Sat No results found for: IRON, TIBC, FERRITIN, IRONPCTSAT Lipid Panel     Component Value Date/Time   CHOL 113 08/29/2018 0916   TRIG 100 08/29/2018 0916   HDL 36 (L) 08/29/2018 0916   CHOLHDL 4 02/15/2018 0905   VLDL 37.0 02/15/2018 0905   LDLCALC 57 08/29/2018 0916   LDLDIRECT 97.0 08/11/2016 0853   Hepatic Function Panel     Component Value Date/Time   PROT 6.9 08/29/2018 0916   PROT 7.5 04/18/2012 0931   ALBUMIN 4.6 08/29/2018 0916   ALBUMIN 4.0 04/18/2012 0931   AST 20 08/29/2018 0916   AST 26 04/18/2012 0931   ALT 24 08/29/2018 0916   ALT 26 04/18/2012 0931   ALKPHOS 72 08/29/2018 0916   ALKPHOS 55 04/18/2012 0931   BILITOT 0.8 08/29/2018 0916   BILITOT 0.8 04/18/2012 0931      Component Value Date/Time   TSH 2.940 05/30/2018 1248   TSH 1.02 05/11/2013 0928   Results for Joshua, Sheri [name] (MRN [id number]) as of 10/17/2018 12:43  Ref. Range 08/29/2018 09:16  Vitamin D, 25-Hydroxy Latest Ref Range: 30.0 - 100.0 ng/mL 63.9   OBESITY BEHAVIORAL INTERVENTION VISIT  Today's visit was # 8   Starting weight: 227 lbs Starting date: 05/30/18 Today's weight : Weight: 198 lb (89.8 kg)  Today's date: 10/17/2018 Total lbs lost to date: 39  ASK: We discussed the diagnosis of obesity with Sheri [name] today and Sheri [name] agreed to give [name] permission to discuss obesity behavioral modification therapy today.  ASSESS: Sheri [name] has the diagnosis of obesity and her BMI today is 35.0. Sheri [name] is in the action stage of change.   ADVISE: Sheri [name] was educated on the multiple health risks of obesity as well as the benefit of weight loss to improve her health. She was advised of the need for long term treatment and the importance of lifestyle modifications to improve her current health and to decrease her risk of future health problems.  AGREE: Multiple dietary modification options and treatment options were discussed  and Sheri [name] agreed to follow the recommendations documented in the above note.  ARRANGE: Sheri [name] was educated on the importance of frequent visits to treat obesity as outlined per CMS and USPSTF guidelines and agreed to schedule her next follow up appointment today.  I, Marcille Blanco, am acting as transcriptionist for Starlyn Skeans, MD  I have reviewed the above documentation for accuracy and completeness, and I agree with the above. -Dennard Nip, MD

## 2018-11-10 DIAGNOSIS — L820 Inflamed seborrheic keratosis: Secondary | ICD-10-CM | POA: Diagnosis not present

## 2018-11-10 DIAGNOSIS — D225 Melanocytic nevi of trunk: Secondary | ICD-10-CM | POA: Diagnosis not present

## 2018-11-10 DIAGNOSIS — D2271 Melanocytic nevi of right lower limb, including hip: Secondary | ICD-10-CM | POA: Diagnosis not present

## 2018-11-10 DIAGNOSIS — D2272 Melanocytic nevi of left lower limb, including hip: Secondary | ICD-10-CM | POA: Diagnosis not present

## 2018-11-10 DIAGNOSIS — D2262 Melanocytic nevi of left upper limb, including shoulder: Secondary | ICD-10-CM | POA: Diagnosis not present

## 2018-11-10 DIAGNOSIS — D2261 Melanocytic nevi of right upper limb, including shoulder: Secondary | ICD-10-CM | POA: Diagnosis not present

## 2018-11-10 DIAGNOSIS — L298 Other pruritus: Secondary | ICD-10-CM | POA: Diagnosis not present

## 2018-11-10 DIAGNOSIS — L821 Other seborrheic keratosis: Secondary | ICD-10-CM | POA: Diagnosis not present

## 2018-11-15 ENCOUNTER — Encounter (INDEPENDENT_AMBULATORY_CARE_PROVIDER_SITE_OTHER): Payer: Self-pay | Admitting: Family Medicine

## 2018-11-15 ENCOUNTER — Ambulatory Visit (INDEPENDENT_AMBULATORY_CARE_PROVIDER_SITE_OTHER): Payer: 59 | Admitting: Family Medicine

## 2018-11-15 VITALS — BP 104/69 | HR 89 | Temp 98.4°F | Ht 63.0 in | Wt 195.0 lb

## 2018-11-15 DIAGNOSIS — Z6834 Body mass index (BMI) 34.0-34.9, adult: Secondary | ICD-10-CM | POA: Diagnosis not present

## 2018-11-15 DIAGNOSIS — E559 Vitamin D deficiency, unspecified: Secondary | ICD-10-CM | POA: Diagnosis not present

## 2018-11-15 DIAGNOSIS — Z9189 Other specified personal risk factors, not elsewhere classified: Secondary | ICD-10-CM | POA: Diagnosis not present

## 2018-11-15 DIAGNOSIS — E1140 Type 2 diabetes mellitus with diabetic neuropathy, unspecified: Secondary | ICD-10-CM | POA: Diagnosis not present

## 2018-11-15 DIAGNOSIS — E669 Obesity, unspecified: Secondary | ICD-10-CM | POA: Diagnosis not present

## 2018-11-15 MED ORDER — VITAMIN D (ERGOCALCIFEROL) 1.25 MG (50000 UNIT) PO CAPS: 50000.0000 [IU] | ORAL_CAPSULE | ORAL | 0 refills | Status: DC

## 2018-11-15 MED ORDER — METFORMIN HCL 500 MG PO TABS: 500.0000 mg | ORAL_TABLET | Freq: Every day | ORAL | 0 refills | Status: DC

## 2018-11-15 NOTE — Progress Notes (Signed)
Office: [phone]  /  Fax: [phone]   HPI:   Chief Complaint: OBESITY Sheri [name] is here to discuss her progress with her obesity treatment plan. She is on the Category 3 plan and is following her eating plan approximately 85 % of the time. She states she is exercising 0 minutes 0 times per week. Sheri [name] continues to do well with weight loss on the Category 3 plan. She is weighing herself daily and finds that she eats more on days when she is disappointed with her weight.  Her weight is 195 lb (88.5 kg) today and has had a weight loss of 3 pounds over a period of 4 weeks since her last visit. She has lost 32 lbs since starting treatment with [name].  Pre-Diabetes Sheri [name] has a diagnosis of pre-diabetes based on her elevated Hgb A1c and was informed this puts her at greater risk of developing diabetes. She is stable on metformin currently and is doing well with diet and weight loss. Sheri [name] continues to work on diet and exercise to decrease risk of diabetes. She denies nausea, vomiting, or hypoglycemia.  Vitamin D deficiency Sheri [name] has a diagnosis of vitamin D deficiency. She is currently stable on vit D and she admits fatigue is improving. Sheri [name] denies nausea, vomiting, or muscle weakness.  At risk for osteopenia and osteoporosis Sheri [name] is at higher risk of osteopenia and osteoporosis due to vitamin D deficiency.   ASSESSMENT AND PLAN:  Type 2 diabetes mellitus with diabetic neuropathy, without long-term current use of insulin ([place]) - Plan: metFORMIN (GLUCOPHAGE) 500 MG tablet  Vitamin D deficiency - Plan: Vitamin D, Ergocalciferol, (DRISDOL) 1.25 MG (50000 UT) CAPS capsule  At risk for osteoporosis  Class 1 obesity with serious comorbidity and body mass index (BMI) of 34.0 to 34.9 in adult, unspecified obesity type  PLAN:  Pre-Diabetes Sheri [name] will continue to work on weight loss, exercise, and decreasing simple carbohydrates in her diet to help decrease the risk of diabetes. She was informed that eating too many  simple carbohydrates or too many calories at one sitting increases the likelihood of GI side effects. Sheri [name] agreed to continue metformin 500mg  qAM #30 with no refills and a prescription was written today. We will check labs in 1 month and Sheri [name] agreed to follow up with [name] as directed to monitor her progress in 3 to 4 weeks.  Vitamin D Deficiency Sheri [name] was informed that low vitamin D levels contributes to fatigue and are associated with obesity, breast, and colon cancer. Sheri [name] agrees to continue to take prescription Vit D @50 ,000 IU every week #4 with no refills and will follow up for routine testing of vitamin D, at least 2-3 times per year. She was informed of the risk of over-replacement of vitamin D and agrees to not increase her dose unless she discusses this with [name] first. We will check labs in 1 month and Sheri [name] agrees to follow up as directed.  At risk for osteopenia and osteoporosis Sheri [name] was given extended (15 minutes) osteoporosis prevention counseling today. Sheri [name] is at risk for osteopenia and osteoporosis due to her vitamin D deficiency. She was encouraged to take her vitamin D and follow her higher calcium diet and increase strengthening exercise to help strengthen her bones and decrease her risk of osteopenia and osteoporosis.  Obesity Sheri [name] is currently in the action stage of change. As such, her goal is to continue with weight loss efforts. She has agreed to follow the Category 3 plan. Sheri [name] has been instructed to  work up to a goal of 150 minutes of combined cardio and strengthening exercise per week for weight loss and overall health benefits. We discussed the following Behavioral Modification Strategies today: increasing lean protein intake, decreasing simple carbohydrates, work on meal planning and easy cooking plans, and emotional eating strategies. Sheri [name] was told to put the scale away on the top shelf of the closet and not to weigh in between visits as this is sabotaging her.  Sheri [name] has agreed to follow up  with our clinic in 3 to 4 weeks. She was informed of the importance of frequent follow up visits to maximize her success with intensive lifestyle modifications for her multiple health conditions.  ALLERGIES: No Known Allergies  MEDICATIONS: Current Outpatient Medications on File Prior to Visit  Medication Sig Dispense Refill  . ALPRAZolam (XANAX) 0.5 MG tablet TAKE 1 TABLET BY MOUTH TWICE DAILY AS NEEDED FOR ANXIETY OR SLEEP 45 tablet 5  . amitriptyline (ELAVIL) 25 MG tablet Take 3 tablets (75 mg total) by mouth at bedtime. For neuropathy 270 tablet 1  . atorvastatin (LIPITOR) 20 MG tablet TAKE 1 TABLET BY MOUTH DAILY. 90 tablet 3  . benzonatate (TESSALON) 200 MG capsule Take 1 capsule (200 mg total) by mouth 3 (three) times daily as needed for cough. 20 capsule 0  . furosemide (LASIX) 20 MG tablet TAKE 1 TABLET BY MOUTH ONCE DAILY AS NEEDED FOR FLUID RETENTION 30 tablet 3  . lisinopril-hydrochlorothiazide (PRINZIDE,ZESTORETIC) 20-25 MG tablet TAKE 1 TABLET BY MOUTH DAILY 90 tablet 1  . meloxicam (MOBIC) 15 MG tablet Take 1 tablet (15 mg total) by mouth daily. 90 tablet 1  . pantoprazole (PROTONIX) 40 MG tablet Take 40 mg by mouth daily.      No current facility-administered medications on file prior to visit.     PAST MEDICAL HISTORY: Past Medical History:  Diagnosis Date  . Abdominal pain, epigastric   . Anxiety   . Back pain   . Diabetes mellitus, type 2 ([place])   . Diverticulosis of colon without hemorrhage   . Duodenitis with hemorrhage   . Edema   . Fluid retention   . GERD (gastroesophageal reflux disease)   . Heart murmur   . Hyperlipidemia   . Hypertension   . Leg edema   . Mitral regurgitation   . Obesity, unspecified   . Osteopenia   . Panic disorder without agoraphobia   . Peripheral neuropathy   . Personal history of malignant neoplasm of cervix uteri    CIN-3 1989, normal since, gets pap smears annually  . Personal history of other genital system and obstetric  disorders(V13.29)   . Prediabetes   . Rash and other nonspecific skin eruption   . Reflux esophagitis   . Swallowing difficulty   . Unspecified hereditary and idiopathic peripheral neuropathy   . Unspecified sleep apnea    sleep study- [facility]  . Vitamin D deficiency     PAST SURGICAL HISTORY: Past Surgical History:  Procedure Laterality Date  . ABDOMINAL HYSTERECTOMY    . BREAST CYST ASPIRATION     right , benign  . COLONOSCOPY WITH PROPOFOL N/A 06/09/2018   Procedure: COLONOSCOPY WITH PROPOFOL;  Surgeon: Lollie Sails, MD;  Location: [facility] ENDOSCOPY;  Service: Endoscopy;  Laterality: N/A;  . DILATION AND CURETTAGE OF UTERUS    . ESOPHAGOGASTRODUODENOSCOPY N/A 06/09/2018   Procedure: ESOPHAGOGASTRODUODENOSCOPY (EGD);  Surgeon: Lollie Sails, MD;  Location: [facility] ENDOSCOPY;  Service: Endoscopy;  Laterality: N/A;  . RIGHT  OOPHORECTOMY  2001   menopause, endometriosis s/p.  HRT  since- has not tolerated previous attempts to wean    SOCIAL HISTORY: Social History   Tobacco Use  . Smoking status: Never Smoker  . Smokeless tobacco: Never Used  Substance Use Topics  . Alcohol use: Yes    Alcohol/week: 0.0 standard drinks    Comment: rare  . Drug use: No    FAMILY HISTORY: Family History  Problem Relation Age of Onset  . Osteopenia Mother   . Hypothyroidism Mother   . Diabetes Mother   . Hyperlipidemia Mother   . Hypertension Mother   . Obesity Mother   . Diabetes Father   . Alzheimer's disease Father   . Coronary artery disease Father   . Obesity Father   . Cancer Neg Hx        breast, ovarian, colon  . Breast cancer Neg Hx     ROS: Review of Systems  Constitutional: Positive for malaise/fatigue and weight loss.  Gastrointestinal: Negative for nausea and vomiting.  Musculoskeletal:       Negative for muscle weakness.  Endo/Heme/Allergies:       Negative for hypoglycemia.    PHYSICAL EXAM: Blood pressure 104/69, pulse 89, temperature 98.4 F (36.9 C),  temperature source Oral, height 5\' 3"  (1.6 m), weight 195 lb (88.5 kg), SpO2 99 %. Body mass index is 34.54 kg/m. Physical Exam Vitals signs reviewed.  Constitutional:      Appearance: Normal appearance. She is obese.  Cardiovascular:     Rate and Rhythm: Normal rate.  Pulmonary:     Effort: Pulmonary effort is normal.  Musculoskeletal: Normal range of motion.  Skin:    General: Skin is warm and dry.  Neurological:     Mental Status: She is alert and oriented to person, place, and time.  Psychiatric:        Mood and Affect: Mood normal.        Behavior: Behavior normal.     RECENT LABS AND TESTS: BMET    Component Value Date/Time   NA 138 08/29/2018 0916   NA 139 04/18/2012 0931   K 4.0 08/29/2018 0916   K 4.1 04/18/2012 0931   CL 101 08/29/2018 0916   CL 106 04/18/2012 0931   CO2 19 (L) 08/29/2018 0916   CO2 23 04/18/2012 0931   GLUCOSE 102 (H) 08/29/2018 0916   GLUCOSE 160 (H) 02/15/2018 0905   GLUCOSE 105 (H) 04/18/2012 0931   BUN 18 08/29/2018 0916   BUN 13 04/18/2012 0931   CREATININE 0.70 08/29/2018 0916   CREATININE 0.63 04/18/2012 0931   CALCIUM 9.6 08/29/2018 0916   CALCIUM 8.9 04/18/2012 0931   GFRNONAA 93 08/29/2018 0916   GFRNONAA >60 04/18/2012 0931   GFRAA 107 08/29/2018 0916   GFRAA >60 04/18/2012 0931   Lab Results  Component Value Date   HGBA1C 5.8 (H) 08/29/2018   HGBA1C 6.4 (H) 05/30/2018   HGBA1C 6.6 (H) 02/15/2018   HGBA1C 6.0 08/10/2017   HGBA1C 6.1 02/11/2017   Lab Results  Component Value Date   INSULIN 11.8 08/29/2018   INSULIN 20.8 05/30/2018   CBC    Component Value Date/Time   WBC 6.6 05/30/2018 1248   WBC 5.1 05/11/2013 0928   RBC 5.00 05/30/2018 1248   RBC 4.62 05/11/2013 0928   HGB 14.4 05/30/2018 1248   HCT 43.8 05/30/2018 1248   PLT 301.0 05/11/2013 0928   MCV 88 05/30/2018 1248   MCH 28.8 05/30/2018 1248  MCHC 32.9 05/30/2018 1248   MCHC 34.3 05/11/2013 0928   RDW 11.9 (L) 05/30/2018 1248   LYMPHSABS 2.2  05/30/2018 1248   MONOABS 0.5 05/11/2013 0928   EOSABS 0.1 05/30/2018 1248   BASOSABS 0.0 05/30/2018 1248   Iron/TIBC/Ferritin/ %Sat No results found for: IRON, TIBC, FERRITIN, IRONPCTSAT Lipid Panel     Component Value Date/Time   CHOL 113 08/29/2018 0916   TRIG 100 08/29/2018 0916   HDL 36 (L) 08/29/2018 0916   CHOLHDL 4 02/15/2018 0905   VLDL 37.0 02/15/2018 0905   LDLCALC 57 08/29/2018 0916   LDLDIRECT 97.0 08/11/2016 0853   Hepatic Function Panel     Component Value Date/Time   PROT 6.9 08/29/2018 0916   PROT 7.5 04/18/2012 0931   ALBUMIN 4.6 08/29/2018 0916   ALBUMIN 4.0 04/18/2012 0931   AST 20 08/29/2018 0916   AST 26 04/18/2012 0931   ALT 24 08/29/2018 0916   ALT 26 04/18/2012 0931   ALKPHOS 72 08/29/2018 0916   ALKPHOS 55 04/18/2012 0931   BILITOT 0.8 08/29/2018 0916   BILITOT 0.8 04/18/2012 0931      Component Value Date/Time   TSH 2.940 05/30/2018 1248   TSH 1.02 05/11/2013 0928   Results for Thier, Tonni ANN (MRN [id number]) as of 11/15/2018 09:40  Ref. Range 08/29/2018 09:16  Vitamin D, 25-Hydroxy Latest Ref Range: 30.0 - 100.0 ng/mL 63.9    OBESITY BEHAVIORAL INTERVENTION VISIT  Today's visit was # 9   Starting weight: 227 lbs Starting date: 05/30/18 Today's weight : Weight: 195 lb (88.5 kg)  Today's date: 11/15/2018 Total lbs lost to date: 32    11/15/2018  Height 5\' 3"  (1.6 m)  Weight 195 lb (88.5 kg)  BMI (Calculated) 34.55  BLOOD PRESSURE - SYSTOLIC 456  BLOOD PRESSURE - DIASTOLIC 69   Body Fat % 25.6 %  Total Body Water (lbs) 72.8 lbs    ASK: We discussed the diagnosis of obesity with Sheri [name] today and Sheri [name] agreed to give [name] permission to discuss obesity behavioral modification therapy today.  ASSESS: Sheri [name] has the diagnosis of obesity and her BMI today is 34.55. Sheri [name] is in the action stage of change.   ADVISE: Sheri [name] was educated on the multiple health risks of obesity as well as the benefit of weight loss to improve her health. She  was advised of the need for long term treatment and the importance of lifestyle modifications to improve her current health and to decrease her risk of future health problems.  AGREE: Multiple dietary modification options and treatment options were discussed and Sheri [name] agreed to follow the recommendations documented in the above note.  ARRANGE: Sheri [name] was educated on the importance of frequent visits to treat obesity as outlined per CMS and USPSTF guidelines and agreed to schedule her next follow up appointment today.  IMarcille Blanco, CMA, am acting as transcriptionist for Starlyn Skeans, MD  I have reviewed the above documentation for accuracy and completeness, and I agree with the above. -Dennard Nip, MD

## 2018-11-24 ENCOUNTER — Other Ambulatory Visit: Payer: Self-pay | Admitting: Internal Medicine

## 2018-12-13 ENCOUNTER — Ambulatory Visit (INDEPENDENT_AMBULATORY_CARE_PROVIDER_SITE_OTHER): Payer: Self-pay | Admitting: Family Medicine

## 2018-12-18 ENCOUNTER — Encounter (INDEPENDENT_AMBULATORY_CARE_PROVIDER_SITE_OTHER): Payer: Self-pay

## 2019-02-08 ENCOUNTER — Ambulatory Visit (INDEPENDENT_AMBULATORY_CARE_PROVIDER_SITE_OTHER): Payer: 59 | Admitting: Family Medicine

## 2019-02-08 ENCOUNTER — Other Ambulatory Visit: Payer: Self-pay

## 2019-02-08 ENCOUNTER — Encounter (INDEPENDENT_AMBULATORY_CARE_PROVIDER_SITE_OTHER): Payer: Self-pay | Admitting: Family Medicine

## 2019-02-08 DIAGNOSIS — Z6834 Body mass index (BMI) 34.0-34.9, adult: Secondary | ICD-10-CM | POA: Diagnosis not present

## 2019-02-08 DIAGNOSIS — E559 Vitamin D deficiency, unspecified: Secondary | ICD-10-CM

## 2019-02-08 DIAGNOSIS — E669 Obesity, unspecified: Secondary | ICD-10-CM

## 2019-02-08 DIAGNOSIS — E1140 Type 2 diabetes mellitus with diabetic neuropathy, unspecified: Secondary | ICD-10-CM | POA: Diagnosis not present

## 2019-02-08 MED ORDER — METFORMIN HCL 500 MG PO TABS: 500.0000 mg | ORAL_TABLET | Freq: Every day | ORAL | 0 refills | Status: DC

## 2019-02-08 MED ORDER — VITAMIN D (ERGOCALCIFEROL) 1.25 MG (50000 UNIT) PO CAPS: 50000.0000 [IU] | ORAL_CAPSULE | ORAL | 0 refills | Status: DC

## 2019-02-13 NOTE — Progress Notes (Signed)
Office: [phone]  /  Fax: [phone] TeleHealth Visit:  Aleen Sells Weitzman has verbally consented to this TeleHealth visit today. The patient is located at work, the provider is located at the [employer] [facility] office. The participants in this visit include the listed provider and patient. The visit was conducted today via [email or web address].  HPI:   Chief Complaint: OBESITY Sheri [name] is here to discuss her progress with her obesity treatment plan. She is on the  follow the Category 3 plan and is following her eating plan approximately  70% of the time. She states she is exercising by walking for 30-35 minutes 2-3 times per week. Oddie's last visit was 3 months ago before COVID-19. She thinks she has gained about 8 lbs since then. She has been stress eating especially at night.   We were unable to weigh the patient today for this TeleHealth visit. She feels as if she has gained weight since her last visit. She has lost 32 lbs since starting treatment with [name].  Pre-Diabetes Liliah has a diagnosis of prediabetes based on her elevated HgA1c and was informed this puts her at greater risk of developing diabetes. She is taking metformin currently and continues to work on diet and exercise to decrease risk of diabetes. She denies nausea or hypoglycemia. She reports noticing an increase in polyphagia when she was off Metformin.   Vitamin D deficiency Shaunie has a diagnosis of vitamin D deficiency. She is currently taking vit D and denies nausea, vomiting or muscle weakness. Her last vitamin D level was at goal.    ASSESSMENT AND PLAN:  Class 1 obesity with serious comorbidity and body mass index (BMI) of 34.0 to 34.9 in adult, unspecified obesity type  Vitamin D deficiency - Plan: Vitamin D, Ergocalciferol, (DRISDOL) 1.25 MG (50000 UT) CAPS capsule  Type 2 diabetes mellitus with diabetic neuropathy, without long-term current use of insulin ([place]) - Plan: metFORMIN (GLUCOPHAGE) 500 MG tablet  PLAN: Pre-Diabetes  Navya will continue to work on weight loss, exercise, and decreasing simple carbohydrates in her diet to help decrease the risk of diabetes. We dicussed metformin including benefits and risks. She was informed that eating too many simple carbohydrates or too many calories at one sitting increases the likelihood of GI side effects. Nyela agrees to continue taking Metformin  Breezie agreed to follow up with [name] as directed to monitor her progress.  Vitamin D Deficiency Madi was informed that low vitamin D levels contributes to fatigue and are associated with obesity, breast, and colon cancer. She agrees to continue to take prescription Vit D @50 ,000 IU every week #4 with no refills and will follow up for routine testing of vitamin D, at least 2-3 times per year. She was informed of the risk of over-replacement of vitamin D and agrees to not increase her dose unless she discusses this with [name] first. Agrees to follow up with our clinic as directed.   Obesity Lili is currently in the action stage of change. As such, her goal is to continue with weight loss efforts She has agreed to follow the Category 3 plan Verle has been instructed to work up to a goal of 150 minutes of combined cardio and strengthening exercise per week for weight loss and overall health benefits. We discussed the following Behavioral Modification Stratagies today: work on meal planning and easy cooking plans, emotional eating strategies and ways to avoid night time snacking   Delynn has agreed to follow up with our clinic in  2 weeks. She was informed of the importance of frequent follow up visits to maximize her success with intensive lifestyle modifications for her multiple health conditions.  ALLERGIES: No Known Allergies  MEDICATIONS: Current Outpatient Medications on File Prior to Visit  Medication Sig Dispense Refill  . ALPRAZolam (XANAX) 0.5 MG tablet TAKE 1 TABLET BY MOUTH TWICE DAILY AS NEEDED FOR ANXIETY OR SLEEP 45 tablet 5  . amitriptyline  (ELAVIL) 25 MG tablet TAKE 3 TABLETS (75 MG TOTAL) BY MOUTH AT BEDTIME FOR NEUROPATHY 270 tablet 1  . atorvastatin (LIPITOR) 20 MG tablet TAKE 1 TABLET BY MOUTH DAILY. 90 tablet 3  . benzonatate (TESSALON) 200 MG capsule Take 1 capsule (200 mg total) by mouth 3 (three) times daily as needed for cough. 20 capsule 0  . furosemide (LASIX) 20 MG tablet TAKE 1 TABLET BY MOUTH ONCE DAILY AS NEEDED FOR FLUID RETENTION 30 tablet 3  . lisinopril-hydrochlorothiazide (PRINZIDE,ZESTORETIC) 20-25 MG tablet TAKE 1 TABLET BY MOUTH DAILY 90 tablet 1  . meloxicam (MOBIC) 15 MG tablet Take 1 tablet (15 mg total) by mouth daily. 90 tablet 1  . pantoprazole (PROTONIX) 40 MG tablet Take 40 mg by mouth daily.      No current facility-administered medications on file prior to visit.     PAST MEDICAL HISTORY: Past Medical History:  Diagnosis Date  . Abdominal pain, epigastric   . Anxiety   . Back pain   . Diabetes mellitus, type 2 ([place])   . Diverticulosis of colon without hemorrhage   . Duodenitis with hemorrhage   . Edema   . Fluid retention   . GERD (gastroesophageal reflux disease)   . Heart murmur   . Hyperlipidemia   . Hypertension   . Leg edema   . Mitral regurgitation   . Obesity, unspecified   . Osteopenia   . Panic disorder without agoraphobia   . Peripheral neuropathy   . Personal history of malignant neoplasm of cervix uteri    CIN-3 1989, normal since, gets pap smears annually  . Personal history of other genital system and obstetric disorders(V13.29)   . Prediabetes   . Rash and other nonspecific skin eruption   . Reflux esophagitis   . Swallowing difficulty   . Unspecified hereditary and idiopathic peripheral neuropathy   . Unspecified sleep apnea    sleep study- [facility]  . Vitamin D deficiency     PAST SURGICAL HISTORY: Past Surgical History:  Procedure Laterality Date  . ABDOMINAL HYSTERECTOMY    . BREAST CYST ASPIRATION     right , benign  . COLONOSCOPY WITH PROPOFOL N/A  06/09/2018   Procedure: COLONOSCOPY WITH PROPOFOL;  Surgeon: Lollie Sails, MD;  Location: [facility] ENDOSCOPY;  Service: Endoscopy;  Laterality: N/A;  . DILATION AND CURETTAGE OF UTERUS    . ESOPHAGOGASTRODUODENOSCOPY N/A 06/09/2018   Procedure: ESOPHAGOGASTRODUODENOSCOPY (EGD);  Surgeon: Lollie Sails, MD;  Location: [facility] ENDOSCOPY;  Service: Endoscopy;  Laterality: N/A;  . RIGHT OOPHORECTOMY  2001   menopause, endometriosis s/p.  HRT  since- has not tolerated previous attempts to wean    SOCIAL HISTORY: Social History   Tobacco Use  . Smoking status: Never Smoker  . Smokeless tobacco: Never Used  Substance Use Topics  . Alcohol use: Yes    Alcohol/week: 0.0 standard drinks    Comment: rare  . Drug use: No    FAMILY HISTORY: Family History  Problem Relation Age of Onset  . Osteopenia Mother   . Hypothyroidism Mother   .  Diabetes Mother   . Hyperlipidemia Mother   . Hypertension Mother   . Obesity Mother   . Diabetes Father   . Alzheimer's disease Father   . Coronary artery disease Father   . Obesity Father   . Cancer Neg Hx        breast, ovarian, colon  . Breast cancer Neg Hx     ROS: Review of Systems  Gastrointestinal: Negative for nausea and vomiting.  Musculoskeletal:       Negative for muscle weakness  Endo/Heme/Allergies:       Negative for polyphagia  Negative for hypoglycemia    PHYSICAL EXAM: Pt in no acute distress  RECENT LABS AND TESTS: BMET    Component Value Date/Time   NA 138 08/29/2018 0916   NA 139 04/18/2012 0931   K 4.0 08/29/2018 0916   K 4.1 04/18/2012 0931   CL 101 08/29/2018 0916   CL 106 04/18/2012 0931   CO2 19 (L) 08/29/2018 0916   CO2 23 04/18/2012 0931   GLUCOSE 102 (H) 08/29/2018 0916   GLUCOSE 160 (H) 02/15/2018 0905   GLUCOSE 105 (H) 04/18/2012 0931   BUN 18 08/29/2018 0916   BUN 13 04/18/2012 0931   CREATININE 0.70 08/29/2018 0916   CREATININE 0.63 04/18/2012 0931   CALCIUM 9.6 08/29/2018 0916   CALCIUM 8.9  04/18/2012 0931   GFRNONAA 93 08/29/2018 0916   GFRNONAA >60 04/18/2012 0931   GFRAA 107 08/29/2018 0916   GFRAA >60 04/18/2012 0931   Lab Results  Component Value Date   HGBA1C 5.8 (H) 08/29/2018   HGBA1C 6.4 (H) 05/30/2018   HGBA1C 6.6 (H) 02/15/2018   HGBA1C 6.0 08/10/2017   HGBA1C 6.1 02/11/2017   Lab Results  Component Value Date   INSULIN 11.8 08/29/2018   INSULIN 20.8 05/30/2018   CBC    Component Value Date/Time   WBC 6.6 05/30/2018 1248   WBC 5.1 05/11/2013 0928   RBC 5.00 05/30/2018 1248   RBC 4.62 05/11/2013 0928   HGB 14.4 05/30/2018 1248   HCT 43.8 05/30/2018 1248   PLT 301.0 05/11/2013 0928   MCV 88 05/30/2018 1248   MCH 28.8 05/30/2018 1248   MCHC 32.9 05/30/2018 1248   MCHC 34.3 05/11/2013 0928   RDW 11.9 (L) 05/30/2018 1248   LYMPHSABS 2.2 05/30/2018 1248   MONOABS 0.5 05/11/2013 0928   EOSABS 0.1 05/30/2018 1248   BASOSABS 0.0 05/30/2018 1248   Iron/TIBC/Ferritin/ %Sat No results found for: IRON, TIBC, FERRITIN, IRONPCTSAT Lipid Panel     Component Value Date/Time   CHOL 113 08/29/2018 0916   TRIG 100 08/29/2018 0916   HDL 36 (L) 08/29/2018 0916   CHOLHDL 4 02/15/2018 0905   VLDL 37.0 02/15/2018 0905   LDLCALC 57 08/29/2018 0916   LDLDIRECT 97.0 08/11/2016 0853   Hepatic Function Panel     Component Value Date/Time   PROT 6.9 08/29/2018 0916   PROT 7.5 04/18/2012 0931   ALBUMIN 4.6 08/29/2018 0916   ALBUMIN 4.0 04/18/2012 0931   AST 20 08/29/2018 0916   AST 26 04/18/2012 0931   ALT 24 08/29/2018 0916   ALT 26 04/18/2012 0931   ALKPHOS 72 08/29/2018 0916   ALKPHOS 55 04/18/2012 0931   BILITOT 0.8 08/29/2018 0916   BILITOT 0.8 04/18/2012 0931      Component Value Date/Time   TSH 2.940 05/30/2018 1248   TSH 1.02 05/11/2013 0928     Ref. Range 08/29/2018 09:16  Vitamin D, 25-Hydroxy Latest Ref Range:  30.0 - 100.0 ng/mL 63.9     I, Renee Ramus, am acting as Location manager for Dennard Nip, MD  I have reviewed the  above documentation for accuracy and completeness, and I agree with the above. -Dennard Nip, MD

## 2019-02-23 ENCOUNTER — Ambulatory Visit (INDEPENDENT_AMBULATORY_CARE_PROVIDER_SITE_OTHER): Payer: 59 | Admitting: Internal Medicine

## 2019-02-23 ENCOUNTER — Encounter: Payer: Self-pay | Admitting: Internal Medicine

## 2019-02-23 ENCOUNTER — Other Ambulatory Visit: Payer: Self-pay

## 2019-02-23 DIAGNOSIS — E559 Vitamin D deficiency, unspecified: Secondary | ICD-10-CM

## 2019-02-23 DIAGNOSIS — Z7189 Other specified counseling: Secondary | ICD-10-CM

## 2019-02-23 DIAGNOSIS — E785 Hyperlipidemia, unspecified: Secondary | ICD-10-CM | POA: Diagnosis not present

## 2019-02-23 DIAGNOSIS — Z6839 Body mass index (BMI) 39.0-39.9, adult: Secondary | ICD-10-CM | POA: Diagnosis not present

## 2019-02-23 DIAGNOSIS — I10 Essential (primary) hypertension: Secondary | ICD-10-CM

## 2019-02-23 DIAGNOSIS — E1169 Type 2 diabetes mellitus with other specified complication: Secondary | ICD-10-CM

## 2019-02-23 DIAGNOSIS — E1142 Type 2 diabetes mellitus with diabetic polyneuropathy: Secondary | ICD-10-CM

## 2019-02-23 NOTE — Patient Instructions (Signed)
Try [employer]ola Bread : low carb,  Low cal,  Great taste,  Available at [employer] labs have been ordered   Ok to schedule CPE (NO PAP )  anytie this fall

## 2019-02-23 NOTE — Progress Notes (Signed)
Virtual Visit via [email or web address]  This visit type was conducted due to national recommendations for restrictions regarding the COVID-19 pandemic (e.g. social distancing).  This format is felt to be most appropriate for this patient at this time.  All issues noted in this document were discussed and addressed.  No physical exam was performed (except for noted visual exam findings with Video Visits).   I connected with@ on 02/23/19 at  8:00 AM EDT by a video enabled telemedicine application or telephone and verified that I am speaking with the correct person using two identifiers. Location patient: home Location provider: work or home office Persons participating in the virtual visit: patient, provider  I discussed the limitations, risks, security and privacy concerns of performing an evaluation and management service by telephone and the availability of in person appointments. I also discussed with the patient that there may be a patient responsible charge related to this service. The patient expressed understanding and agreed to proceed.  Reason for visit: follow up on type 2 DM with obesity, hyperlipidemia   HPI:  Obesity:  She has  lost 37 lbs since June 2019 but has gained 7 LBS since the COVID 19 epidemic due to STRESS EATING  Lack of avialble meal choices caused by COVID 19 supply issues.  Not exercising . 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  The patient has no signs or symptoms of COVID 19 infection (fever, cough, sore throat  or shortness of breath beyond what is typical for patient).  Patient denies contact with other persons with the above mentioned symptoms or with anyone confirmed to have COVID 19   follow up on Type 2 DM, obesity and hypertension.  She has been taking her medications as directed and  Is following a low glycemic index diet regularly but has  gained 7 few lbs .  Fasting sugars have been normal with fastings < 120 and post prandials < 160.   She is not exercsing regularly.  Denies hypoglyemic events.  Hypertension: patient checks blood pressure twice weekly at home.  Readings have been for the most part < 140/80 at rest . Patient is following a reduced salt diet most days and is taking medications as prescribed. Patient has been advised that evening administration of anti hypertensive medication  has been shown to reduce incidence of AMI and CVA.     ROS: See pertinent positives and negatives per HPI.  Past Medical History:  Diagnosis Date  . Abdominal pain, epigastric   . Anxiety   . Back pain   . Diabetes mellitus, type 2 ([place])   . Diverticulosis of colon without hemorrhage   . Duodenitis with hemorrhage   . Edema   . Fluid retention   . GERD (gastroesophageal reflux disease)   . Heart murmur   . Hyperlipidemia   . Hypertension   . Leg edema   . Mitral regurgitation   . Obesity, unspecified   . Osteopenia   . Panic disorder without agoraphobia   . Peripheral neuropathy   . Personal history of malignant neoplasm of cervix uteri    CIN-3 1989, normal since, gets pap smears annually  . Personal history of other genital system and obstetric disorders(V13.29)   . Prediabetes   . Rash and other nonspecific skin eruption   . Reflux esophagitis   . Swallowing difficulty   .  Unspecified hereditary and idiopathic peripheral neuropathy   . Unspecified sleep apnea    sleep study- [facility]  . Vitamin D deficiency     Past Surgical History:  Procedure Laterality Date  . ABDOMINAL HYSTERECTOMY    . BREAST CYST ASPIRATION     right , benign  . COLONOSCOPY WITH PROPOFOL N/A 06/09/2018   Procedure: COLONOSCOPY WITH PROPOFOL;  Surgeon: Lollie Sails, MD;  Location: [facility] ENDOSCOPY;  Service: Endoscopy;  Laterality: N/A;  . DILATION AND CURETTAGE OF UTERUS    . ESOPHAGOGASTRODUODENOSCOPY N/A 06/09/2018   Procedure:  ESOPHAGOGASTRODUODENOSCOPY (EGD);  Surgeon: Lollie Sails, MD;  Location: [facility] ENDOSCOPY;  Service: Endoscopy;  Laterality: N/A;  . RIGHT OOPHORECTOMY  2001   menopause, endometriosis s/p.  HRT  since- has not tolerated previous attempts to wean    Family History  Problem Relation Age of Onset  . Osteopenia Mother   . Hypothyroidism Mother   . Diabetes Mother   . Hyperlipidemia Mother   . Hypertension Mother   . Obesity Mother   . Diabetes Father   . Alzheimer's disease Father   . Coronary artery disease Father   . Obesity Father   . Cancer Neg Hx        breast, ovarian, colon  . Breast cancer Neg Hx     SOCIAL HX:  reports that she has never smoked. She has never used smokeless tobacco. She reports current alcohol use. She reports that she does not use drugs.   Current Outpatient Medications:  .  ALPRAZolam (XANAX) 0.5 MG tablet, TAKE 1 TABLET BY MOUTH TWICE DAILY AS NEEDED FOR ANXIETY OR SLEEP, Disp: 45 tablet, Rfl: 5 .  amitriptyline (ELAVIL) 25 MG tablet, TAKE 3 TABLETS (75 MG TOTAL) BY MOUTH AT BEDTIME FOR NEUROPATHY, Disp: 270 tablet, Rfl: 1 .  atorvastatin (LIPITOR) 20 MG tablet, TAKE 1 TABLET BY MOUTH DAILY., Disp: 90 tablet, Rfl: 3 .  benzonatate (TESSALON) 200 MG capsule, Take 1 capsule (200 mg total) by mouth 3 (three) times daily as needed for cough., Disp: 20 capsule, Rfl: 0 .  furosemide (LASIX) 20 MG tablet, TAKE 1 TABLET BY MOUTH ONCE DAILY AS NEEDED FOR FLUID RETENTION, Disp: 30 tablet, Rfl: 3 .  meloxicam (MOBIC) 15 MG tablet, Take 1 tablet (15 mg total) by mouth daily., Disp: 90 tablet, Rfl: 1 .  metFORMIN (GLUCOPHAGE) 500 MG tablet, Take 1 tablet (500 mg total) by mouth daily with breakfast., Disp: 30 tablet, Rfl: 0 .  pantoprazole (PROTONIX) 40 MG tablet, Take 40 mg by mouth daily. , Disp: , Rfl:  .  Vitamin D, Ergocalciferol, (DRISDOL) 1.25 MG (50000 UT) CAPS capsule, Take 1 capsule (50,000 Units total) by mouth every 7 (seven) days., Disp: 4 capsule, Rfl:  0 .  losartan-hydrochlorothiazide (HYZAAR) 50-12.5 MG tablet, Take 1 tablet by mouth at bedtime., Disp: 90 tablet, Rfl: 3  EXAM:  VITALS per patient if applicable:  GENERAL: alert, oriented, appears well and in no acute distress  HEENT: atraumatic, conjunttiva clear, no obvious abnormalities on inspection of external nose and ears  NECK: normal movements of the head and neck  LUNGS: on inspection no signs of respiratory distress, breathing rate appears normal, no obvious gross SOB, gasping or wheezing  CV: no obvious cyanosis  MS: moves all visible extremities without noticeable abnormality  PSYCH/NEURO: pleasant and cooperative, no obvious depression or anxiety, speech and thought processing grossly intact  ASSESSMENT AND PLAN:  Discussed the following assessment and plan:  Hyperlipidemia associated with  type 2 diabetes mellitus ([place]) - Plan: Lipid panel  Vitamin D deficiency - Plan: VITAMIN D 25 Hydroxy (Vit-D Deficiency, Fractures)  Type 2 DM with diabetic neuropathy affecting both sides of body (HCC) - Plan: Insulin, random, Comprehensive metabolic panel, Hemoglobin A1c  Essential hypertension, benign  Class 2 severe obesity due to excess calories with serious comorbidity and body mass index (BMI) of 39.0 to 39.9 in adult [facility])  Educated About Covid-19 Virus Infection  Type 2 DM with diabetic neuropathy affecting both sides of body Currently well-controlled on metformin .  hemoglobin A1c is at goal of less than 7.0 . Patient is reminded to schedule an annual eye exam and foot exam is normal today. Patient has no microalbuminuria. Patient is tolerating statin therapy for CAD risk reduction and on ACE/ARB for renal protection and hypertension   Lab Results  Component Value Date   HGBA1C 5.8 (H) 08/29/2018     Essential hypertension, benign Well controlled on current regimen. Renal function stable. I am making a decision to change patient's ACE Inhibitor to an ARB   based on increased reports of  angioedema.  I also advised patient to to take it at night instead of morning,  as recent studies have shown a reduction in incidence of heart attacks and strokes.   Obesity I have congratulated her in reduction of   BMI and encouraged  Continued weight loss with goal of 10% of body weigh over the next 6 months using a low glycemic index diet and regular exercise a minimum of 5 days per week.    Hyperlipidemia associated with type 2 diabetes mellitus Cumulative risk managed with low dose atorvastatin  Educated About Covid-19 Virus Infection Educated patient on the newly broadened list of signs and symptoms of COVID-19 infection and ways to avoid the viral infection including washing hands frequently with soap and water,  using hand sanitizer if unable to wash, avoiding touching face,  staying at home and limiting visitors,  and avoiding contact with people coming in and out of home.  Discussed the potential ineffectiveness of hand sanitizer if left in environments > 110 degrees (ie , the car).  Reminded patient to call office with questions/concerns.  The importance of social distancing was discussed today    I discussed the assessment and treatment plan with the patient. The patient was provided an opportunity to ask questions and all were answered. The patient agreed with the plan and demonstrated an understanding of the instructions.   The patient was advised to call back or seek an in-person evaluation if the symptoms worsen or if the condition fails to improve as anticipated.  I provided  25 minutes of non-face-to-face time during this encounter.   Crecencio Mc, MD

## 2019-02-25 ENCOUNTER — Telehealth: Payer: Self-pay | Admitting: Internal Medicine

## 2019-02-25 DIAGNOSIS — Z7189 Other specified counseling: Secondary | ICD-10-CM | POA: Insufficient documentation

## 2019-02-25 MED ORDER — LOSARTAN POTASSIUM-HCTZ 50-12.5 MG PO TABS: 1.0000 | ORAL_TABLET | Freq: Every day | ORAL | 3 refills | Status: DC

## 2019-02-25 NOTE — Assessment & Plan Note (Signed)
I have congratulated her in reduction of   BMI and encouraged  Continued weight loss with goal of 10% of body weigh over the next 6 months using a low glycemic index diet and regular exercise a minimum of 5 days per week.    

## 2019-02-25 NOTE — Assessment & Plan Note (Signed)
Currently well-controlled on metformin .  hemoglobin A1c is at goal of less than 7.0 . Patient is reminded to schedule an annual eye exam and foot exam is normal today. Patient has no microalbuminuria. Patient is tolerating statin therapy for CAD risk reduction and on ACE/ARB for renal protection and hypertension   Lab Results  Component Value Date   HGBA1C 5.8 (H) 08/29/2018

## 2019-02-25 NOTE — Assessment & Plan Note (Signed)

## 2019-02-25 NOTE — Assessment & Plan Note (Signed)
Well controlled on current regimen. Renal function stable. I am making a decision to change patient's ACE Inhibitor to an ARB  based on increased reports of  angioedema.  I also advised patient to to take it at night instead of morning,  as recent studies have shown a reduction in incidence of heart attacks and strokes.

## 2019-02-25 NOTE — Assessment & Plan Note (Signed)
Cumulative risk managed with low dose atorvastatin

## 2019-02-26 ENCOUNTER — Other Ambulatory Visit: Payer: Self-pay

## 2019-02-26 ENCOUNTER — Ambulatory Visit (INDEPENDENT_AMBULATORY_CARE_PROVIDER_SITE_OTHER): Payer: 59 | Admitting: Family Medicine

## 2019-02-26 ENCOUNTER — Encounter (INDEPENDENT_AMBULATORY_CARE_PROVIDER_SITE_OTHER): Payer: Self-pay | Admitting: Family Medicine

## 2019-02-26 DIAGNOSIS — E669 Obesity, unspecified: Secondary | ICD-10-CM

## 2019-02-26 DIAGNOSIS — Z6834 Body mass index (BMI) 34.0-34.9, adult: Secondary | ICD-10-CM | POA: Diagnosis not present

## 2019-02-26 DIAGNOSIS — E559 Vitamin D deficiency, unspecified: Secondary | ICD-10-CM

## 2019-02-26 DIAGNOSIS — E1140 Type 2 diabetes mellitus with diabetic neuropathy, unspecified: Secondary | ICD-10-CM | POA: Diagnosis not present

## 2019-02-27 ENCOUNTER — Other Ambulatory Visit: Payer: Self-pay | Admitting: Internal Medicine

## 2019-02-27 MED ORDER — METFORMIN HCL 500 MG PO TABS: 500.0000 mg | ORAL_TABLET | Freq: Every day | ORAL | 0 refills | Status: DC

## 2019-02-27 MED ORDER — VITAMIN D (ERGOCALCIFEROL) 1.25 MG (50000 UNIT) PO CAPS: 50000.0000 [IU] | ORAL_CAPSULE | ORAL | 0 refills | Status: DC

## 2019-02-27 NOTE — Progress Notes (Signed)
Office: [phone]  /  Fax: [phone] TeleHealth Visit:  Sheri [name] has verbally consented to this TeleHealth visit today. The patient is located at work, the provider is located at the [employer] [facility] office. The participants in this visit include the listed provider and patient and any and all parties involved. The visit was conducted today via FaceTime.  HPI:   Chief Complaint: OBESITY Sheri [name] is here to discuss her progress with her obesity treatment plan. She is on the Category 3 plan and is following her eating plan approximately 85 % of the time. She states she is exercising 0 minutes 0 times per week. Sheri [name] continues to do well with maintaining her weight. She is deviating more from the Category 3 plan, and the nutrition isn't bad, but her calories are increasing. We were unable to weigh the patient today for this TeleHealth visit. She feels as if she has maintained weight since her last visit. She has lost 32 lbs since starting treatment with [name].  Diabetes II Sheri [name] has a diagnosis of diabetes type II. Sheri [name] is stable on metformin and diet. She denies nausea, vomiting or any hypoglycemic episodes. Last A1c was at 5.8 She has been working on intensive lifestyle modifications including diet, exercise, and weight loss to help control her blood glucose levels.  Vitamin D deficiency Sheri [name] has a diagnosis of vitamin D deficiency. Her last vitamin D level was at goal. Sheri [name] is currently taking vit D and she denies nausea, vomiting or muscle weakness.  ASSESSMENT AND PLAN:  Type 2 diabetes mellitus with diabetic neuropathy, without long-term current use of insulin ([place]) - Plan: metFORMIN (GLUCOPHAGE) 500 MG tablet  Vitamin D deficiency - Plan: Vitamin D, Ergocalciferol, (DRISDOL) 1.25 MG (50000 UT) CAPS capsule  Class 1 obesity with serious comorbidity and body mass index (BMI) of 34.0 to 34.9 in adult, unspecified obesity type  PLAN:  Diabetes II Sheri [name] has been given extensive diabetes  education by myself today including ideal fasting and post-prandial blood glucose readings, individual ideal Hgb A1c goals and hypoglycemia prevention. We discussed the importance of good blood sugar control to decrease the likelihood of diabetic complications such as nephropathy, neuropathy, limb loss, blindness, coronary artery disease, and death. We discussed the importance of intensive lifestyle modification including diet, exercise and weight loss as the first line treatment for diabetes. Sheri [name] agrees to continue metformin 500 mg daily with breakfast #30 with no refills and follow up at the agreed upon time.  Vitamin D Deficiency Sheri [name] was informed that low vitamin D levels contributes to fatigue and are associated with obesity, breast, and colon cancer. She agrees to continue to take prescription Vit D @50 ,000 IU every week #4 with no refills and will follow up for routine testing of vitamin D, at least 2-3 times per year. She was informed of the risk of over-replacement of vitamin D and agrees to not increase her dose unless she discusses this with [name] first. Sheri [name] agrees to follow up as directed.  Obesity Sheri [name] is currently in the action stage of change. As such, her goal is to continue with weight loss efforts She has agreed to change to keeping a food journal with 1400 to 1500 calories and 90+ grams of protein daily Sheri [name] has been instructed to work up to a goal of 150 minutes of combined cardio and strengthening exercise per week for weight loss and overall health benefits. We discussed the following Behavioral Modification Strategies today: increasing lean protein intake and decreasing  simple carbohydrates   Sheri [name] has agreed to follow up with our clinic in 3 weeks. She was informed of the importance of frequent follow up visits to maximize her success with intensive lifestyle modifications for her multiple health conditions.  ALLERGIES: No Known Allergies  MEDICATIONS: Current Outpatient Medications on  File Prior to Visit  Medication Sig Dispense Refill  . ALPRAZolam (XANAX) 0.5 MG tablet TAKE 1 TABLET BY MOUTH TWICE DAILY AS NEEDED FOR ANXIETY OR SLEEP 45 tablet 5  . amitriptyline (ELAVIL) 25 MG tablet TAKE 3 TABLETS (75 MG TOTAL) BY MOUTH AT BEDTIME FOR NEUROPATHY 270 tablet 1  . atorvastatin (LIPITOR) 20 MG tablet TAKE 1 TABLET BY MOUTH DAILY. 90 tablet 3  . benzonatate (TESSALON) 200 MG capsule Take 1 capsule (200 mg total) by mouth 3 (three) times daily as needed for cough. 20 capsule 0  . furosemide (LASIX) 20 MG tablet TAKE 1 TABLET BY MOUTH ONCE DAILY AS NEEDED FOR FLUID RETENTION 30 tablet 3  . losartan-hydrochlorothiazide (HYZAAR) 50-12.5 MG tablet Take 1 tablet by mouth at bedtime. 90 tablet 3  . meloxicam (MOBIC) 15 MG tablet Take 1 tablet (15 mg total) by mouth daily. 90 tablet 1  . pantoprazole (PROTONIX) 40 MG tablet Take 40 mg by mouth daily.      No current facility-administered medications on file prior to visit.     PAST MEDICAL HISTORY: Past Medical History:  Diagnosis Date  . Abdominal pain, epigastric   . Anxiety   . Back pain   . Diabetes mellitus, type 2 ([place])   . Diverticulosis of colon without hemorrhage   . Duodenitis with hemorrhage   . Edema   . Fluid retention   . GERD (gastroesophageal reflux disease)   . Heart murmur   . Hyperlipidemia   . Hypertension   . Leg edema   . Mitral regurgitation   . Obesity, unspecified   . Osteopenia   . Panic disorder without agoraphobia   . Peripheral neuropathy   . Personal history of malignant neoplasm of cervix uteri    CIN-3 1989, normal since, gets pap smears annually  . Personal history of other genital system and obstetric disorders(V13.29)   . Prediabetes   . Rash and other nonspecific skin eruption   . Reflux esophagitis   . Swallowing difficulty   . Unspecified hereditary and idiopathic peripheral neuropathy   . Unspecified sleep apnea    sleep study- [facility]  . Vitamin D deficiency     PAST  SURGICAL HISTORY: Past Surgical History:  Procedure Laterality Date  . ABDOMINAL HYSTERECTOMY    . BREAST CYST ASPIRATION     right , benign  . COLONOSCOPY WITH PROPOFOL N/A 06/09/2018   Procedure: COLONOSCOPY WITH PROPOFOL;  Surgeon: Lollie Sails, MD;  Location: [facility] ENDOSCOPY;  Service: Endoscopy;  Laterality: N/A;  . DILATION AND CURETTAGE OF UTERUS    . ESOPHAGOGASTRODUODENOSCOPY N/A 06/09/2018   Procedure: ESOPHAGOGASTRODUODENOSCOPY (EGD);  Surgeon: Lollie Sails, MD;  Location: [facility] ENDOSCOPY;  Service: Endoscopy;  Laterality: N/A;  . RIGHT OOPHORECTOMY  2001   menopause, endometriosis s/p.  HRT  since- has not tolerated previous attempts to wean    SOCIAL HISTORY: Social History   Tobacco Use  . Smoking status: Never Smoker  . Smokeless tobacco: Never Used  Substance Use Topics  . Alcohol use: Yes    Alcohol/week: 0.0 standard drinks    Comment: rare  . Drug use: No    FAMILY HISTORY: Family History  Problem Relation Age of Onset  . Osteopenia Mother   . Hypothyroidism Mother   . Diabetes Mother   . Hyperlipidemia Mother   . Hypertension Mother   . Obesity Mother   . Diabetes Father   . Alzheimer's disease Father   . Coronary artery disease Father   . Obesity Father   . Cancer Neg Hx        breast, ovarian, colon  . Breast cancer Neg Hx     ROS: Review of Systems  Constitutional: Negative for weight loss.  Gastrointestinal: Negative for nausea and vomiting.  Musculoskeletal:       Negative for muscle weakness  Endo/Heme/Allergies:       Negative for hypoglycemia    PHYSICAL EXAM: Pt in no acute distress  RECENT LABS AND TESTS: BMET    Component Value Date/Time   NA 138 08/29/2018 0916   NA 139 04/18/2012 0931   K 4.0 08/29/2018 0916   K 4.1 04/18/2012 0931   CL 101 08/29/2018 0916   CL 106 04/18/2012 0931   CO2 19 (L) 08/29/2018 0916   CO2 23 04/18/2012 0931   GLUCOSE 102 (H) 08/29/2018 0916   GLUCOSE 160 (H) 02/15/2018 0905    GLUCOSE 105 (H) 04/18/2012 0931   BUN 18 08/29/2018 0916   BUN 13 04/18/2012 0931   CREATININE 0.70 08/29/2018 0916   CREATININE 0.63 04/18/2012 0931   CALCIUM 9.6 08/29/2018 0916   CALCIUM 8.9 04/18/2012 0931   GFRNONAA 93 08/29/2018 0916   GFRNONAA >60 04/18/2012 0931   GFRAA 107 08/29/2018 0916   GFRAA >60 04/18/2012 0931   Lab Results  Component Value Date   HGBA1C 5.8 (H) 08/29/2018   HGBA1C 6.4 (H) 05/30/2018   HGBA1C 6.6 (H) 02/15/2018   HGBA1C 6.0 08/10/2017   HGBA1C 6.1 02/11/2017   Lab Results  Component Value Date   INSULIN 11.8 08/29/2018   INSULIN 20.8 05/30/2018   CBC    Component Value Date/Time   WBC 6.6 05/30/2018 1248   WBC 5.1 05/11/2013 0928   RBC 5.00 05/30/2018 1248   RBC 4.62 05/11/2013 0928   HGB 14.4 05/30/2018 1248   HCT 43.8 05/30/2018 1248   PLT 301.0 05/11/2013 0928   MCV 88 05/30/2018 1248   MCH 28.8 05/30/2018 1248   MCHC 32.9 05/30/2018 1248   MCHC 34.3 05/11/2013 0928   RDW 11.9 (L) 05/30/2018 1248   LYMPHSABS 2.2 05/30/2018 1248   MONOABS 0.5 05/11/2013 0928   EOSABS 0.1 05/30/2018 1248   BASOSABS 0.0 05/30/2018 1248   Iron/TIBC/Ferritin/ %Sat No results found for: IRON, TIBC, FERRITIN, IRONPCTSAT Lipid Panel     Component Value Date/Time   CHOL 113 08/29/2018 0916   TRIG 100 08/29/2018 0916   HDL 36 (L) 08/29/2018 0916   CHOLHDL 4 02/15/2018 0905   VLDL 37.0 02/15/2018 0905   LDLCALC 57 08/29/2018 0916   LDLDIRECT 97.0 08/11/2016 0853   Hepatic Function Panel     Component Value Date/Time   PROT 6.9 08/29/2018 0916   PROT 7.5 04/18/2012 0931   ALBUMIN 4.6 08/29/2018 0916   ALBUMIN 4.0 04/18/2012 0931   AST 20 08/29/2018 0916   AST 26 04/18/2012 0931   ALT 24 08/29/2018 0916   ALT 26 04/18/2012 0931   ALKPHOS 72 08/29/2018 0916   ALKPHOS 55 04/18/2012 0931   BILITOT 0.8 08/29/2018 0916   BILITOT 0.8 04/18/2012 0931      Component Value Date/Time   TSH 2.940 05/30/2018 1248  TSH 1.02 05/11/2013 0928      Ref. Range 08/29/2018 09:16  Vitamin D, 25-Hydroxy Latest Ref Range: 30.0 - 100.0 ng/mL 63.9    I, Doreene Nest, am acting as Location manager for Dennard Nip, MD I have reviewed the above documentation for accuracy and completeness, and I agree with the above. -Dennard Nip, MD

## 2019-02-28 LAB — HM DIABETES FOOT EXAM

## 2019-03-02 ENCOUNTER — Other Ambulatory Visit (INDEPENDENT_AMBULATORY_CARE_PROVIDER_SITE_OTHER): Payer: 59

## 2019-03-02 ENCOUNTER — Other Ambulatory Visit: Payer: Self-pay

## 2019-03-02 DIAGNOSIS — E559 Vitamin D deficiency, unspecified: Secondary | ICD-10-CM | POA: Diagnosis not present

## 2019-03-02 DIAGNOSIS — E1169 Type 2 diabetes mellitus with other specified complication: Secondary | ICD-10-CM

## 2019-03-02 DIAGNOSIS — E1142 Type 2 diabetes mellitus with diabetic polyneuropathy: Secondary | ICD-10-CM | POA: Diagnosis not present

## 2019-03-02 DIAGNOSIS — E785 Hyperlipidemia, unspecified: Secondary | ICD-10-CM

## 2019-03-02 LAB — COMPREHENSIVE METABOLIC PANEL
ALT: 19 U/L (ref 0–35)
AST: 15 U/L (ref 0–37)
Albumin: 4.3 g/dL (ref 3.5–5.2)
Alkaline Phosphatase: 63 U/L (ref 39–117)
BUN: 16 mg/dL (ref 6–23)
CO2: 23 mEq/L (ref 19–32)
Calcium: 9.3 mg/dL (ref 8.4–10.5)
Chloride: 105 mEq/L (ref 96–112)
Creatinine, Ser: 0.64 mg/dL (ref 0.40–1.20)
GFR: 93.75 mL/min (ref >60.00)
Glucose, Bld: 101 mg/dL — ABNORMAL HIGH (ref 70–99)
Potassium: 4.3 mEq/L (ref 3.5–5.1)
Sodium: 138 mEq/L (ref 135–145)
Total Bilirubin: 0.8 mg/dL (ref 0.2–1.2)
Total Protein: 6.6 g/dL (ref 6.0–8.3)

## 2019-03-02 LAB — LIPID PANEL
Cholesterol: 154 mg/dL (ref 0–200)
HDL: 51.8 mg/dL (ref >39.00)
LDL Cholesterol: 80 mg/dL (ref 0–99)
NonHDL: 102.47
Total CHOL/HDL Ratio: 3
Triglycerides: 110 mg/dL (ref 0.0–149.0)
VLDL: 22 mg/dL (ref 0.0–40.0)

## 2019-03-02 LAB — VITAMIN D 25 HYDROXY (VIT D DEFICIENCY, FRACTURES): VITD: 44.38 ng/mL (ref 30.00–100.00)

## 2019-03-02 LAB — HEMOGLOBIN A1C: Hgb A1c MFr Bld: 6.2 % (ref 4.6–6.5)

## 2019-03-02 NOTE — Telephone Encounter (Signed)
Refilled: 10/04/2018 Last OV: 02/23/2019 Next OV: 06/28/2019

## 2019-03-05 ENCOUNTER — Telehealth (INDEPENDENT_AMBULATORY_CARE_PROVIDER_SITE_OTHER): Payer: Self-pay | Admitting: Family Medicine

## 2019-03-05 LAB — TIQ-MISC

## 2019-03-05 LAB — INSULIN, RANDOM

## 2019-03-05 NOTE — Telephone Encounter (Signed)
Labs were drawn at Dr. Lupita Dawn and are now in [place] for Dr. Leafy Ro to view

## 2019-03-05 NOTE — Telephone Encounter (Signed)
FYI

## 2019-03-09 ENCOUNTER — Encounter: Payer: Self-pay | Admitting: *Deleted

## 2019-03-19 ENCOUNTER — Ambulatory Visit (INDEPENDENT_AMBULATORY_CARE_PROVIDER_SITE_OTHER): Payer: 59 | Admitting: Family Medicine

## 2019-03-19 ENCOUNTER — Encounter (INDEPENDENT_AMBULATORY_CARE_PROVIDER_SITE_OTHER): Payer: Self-pay | Admitting: Family Medicine

## 2019-03-19 ENCOUNTER — Other Ambulatory Visit: Payer: Self-pay

## 2019-03-19 DIAGNOSIS — E1140 Type 2 diabetes mellitus with diabetic neuropathy, unspecified: Secondary | ICD-10-CM

## 2019-03-19 DIAGNOSIS — E669 Obesity, unspecified: Secondary | ICD-10-CM | POA: Diagnosis not present

## 2019-03-19 DIAGNOSIS — E559 Vitamin D deficiency, unspecified: Secondary | ICD-10-CM

## 2019-03-19 DIAGNOSIS — Z6834 Body mass index (BMI) 34.0-34.9, adult: Secondary | ICD-10-CM | POA: Diagnosis not present

## 2019-03-19 MED ORDER — VITAMIN D (ERGOCALCIFEROL) 1.25 MG (50000 UNIT) PO CAPS: 50000.0000 [IU] | ORAL_CAPSULE | ORAL | 0 refills | Status: DC

## 2019-03-19 MED ORDER — METFORMIN HCL 500 MG PO TABS: 500.0000 mg | ORAL_TABLET | Freq: Every day | ORAL | 0 refills | Status: DC

## 2019-03-20 NOTE — Progress Notes (Signed)
Office: [phone]  /  Fax: [phone] TeleHealth Visit:  Sheri [name] has verbally consented to this TeleHealth visit today. The patient is located at home, the provider is located at the [employer] [facility] office. The participants in this visit include the listed provider and patient. Sheri [name] was unable to use realtime audiovisual technology today and the telehealth visit was conducted via telephone.   HPI:   Chief Complaint: OBESITY Sheri [name] is here to discuss her progress with her obesity treatment plan. She is on the keep a food journal with 1400-1500 calories and 90+ grams of protein daily and is following her eating plan approximately 5 % of the time. She states she is walking for 30 minutes 3-4 times per week. Sheri [name] has been off track with weight loss efforts, and she has gained a significant amount of weight since COVID-19 and her weight is at 215 lbs. She states she is ready to get back on track.  We were unable to weigh the patient today for this TeleHealth visit. She feels as if she has gained weight since her last visit. She has lost 32 lbs since starting treatment with [name].  Diabetes II Sheri [name] has a diagnosis of diabetes type II. Leoni's last A1c was controlled at 6.2 on metformin, but it has increased from 5.8 before COVID-19 and she has gained weight. She denies nausea, vomiting, or hypoglycemia. She has been working on intensive lifestyle modifications including diet, exercise, and weight loss to help control her blood glucose levels.  Vitamin D Deficiency Sheri [name] has a diagnosis of vitamin D deficiency. She is stable on prescription Vit D, and last level was at goal. She denies nausea, vomiting or muscle weakness.  ASSESSMENT AND PLAN:  Vitamin D deficiency - Plan: Vitamin D, Ergocalciferol, (DRISDOL) 1.25 MG (50000 UT) CAPS capsule  Type 2 diabetes mellitus with diabetic neuropathy, without long-term current use of insulin (HCC) - Plan: metFORMIN (GLUCOPHAGE) 500 MG tablet  Class 1  obesity with serious comorbidity and body mass index (BMI) of 34.0 to 34.9 in adult, unspecified obesity type  PLAN:  Diabetes II Sheri [name] has been given extensive diabetes education by myself today including ideal fasting and post-prandial blood glucose readings, individual ideal Hgb A1c goals and hypoglycemia prevention. We discussed the importance of good blood sugar control to decrease the likelihood of diabetic complications such as nephropathy, neuropathy, limb loss, blindness, coronary artery disease, and death. We discussed the importance of intensive lifestyle modification including diet, exercise and weight loss as the first line treatment for diabetes. Sheri [name] agrees to continue taking metformin 500 mg q AM #30 and we will refill for 1 month. She is to get back to diet prescription. Sheri [name] agrees to follow up with our clinic in 2 weeks.  Vitamin D Deficiency Sheri [name] was informed that low vitamin D levels contributes to fatigue and are associated with obesity, breast, and colon cancer. Sheri [name] agrees to continue taking prescription Vit D 50,000 IU every week #4 and we will refill for 1 month. She will follow up for routine testing of vitamin D, at least 2-3 times per year. She was informed of the risk of over-replacement of vitamin D and agrees to not increase her dose unless she discusses this with [name] first. We will recheck labs at next visit. Sheri [name] agrees to follow up with our clinic in 2 weeks.  Obesity Sheri [name] is currently in the action stage of change. As such, her goal is to continue with weight loss efforts She has  agreed to change to follow a lower carbohydrate, vegetable and lean protein rich diet plan (sent via MyChart) Sheri [name] has been instructed to work up to a goal of 150 minutes of combined cardio and strengthening exercise per week for weight loss and overall health benefits. We discussed the following Behavioral Modification Strategies today: increasing vegetables, increase H20 intake, no skipping meals, and work  on meal planning and easy cooking plans  I spent > than 50% of the 25 minute visit on counseling as documented in the note.   Sheri [name] has agreed to follow up with our clinic in 2 weeks. She was informed of the importance of frequent follow up visits to maximize her success with intensive lifestyle modifications for her multiple health conditions.  ALLERGIES: No Known Allergies  MEDICATIONS: Current Outpatient Medications on File Prior to Visit  Medication Sig Dispense Refill  . ALPRAZolam (XANAX) 0.5 MG tablet TAKE 1 TABLET BY MOUTH TWICE DAILY AS NEEDED FOR ANXIETY OR SLEEP 45 tablet 5  . amitriptyline (ELAVIL) 25 MG tablet TAKE 3 TABLETS (75 MG TOTAL) BY MOUTH AT BEDTIME FOR NEUROPATHY 270 tablet 1  . atorvastatin (LIPITOR) 20 MG tablet TAKE 1 TABLET BY MOUTH DAILY. 90 tablet 3  . benzonatate (TESSALON) 200 MG capsule Take 1 capsule (200 mg total) by mouth 3 (three) times daily as needed for cough. 20 capsule 0  . furosemide (LASIX) 20 MG tablet TAKE 1 TABLET BY MOUTH ONCE DAILY AS NEEDED FOR FLUID RETENTION 30 tablet 3  . losartan-hydrochlorothiazide (HYZAAR) 50-12.5 MG tablet Take 1 tablet by mouth at bedtime. 90 tablet 3  . meloxicam (MOBIC) 15 MG tablet Take 1 tablet (15 mg total) by mouth daily. 90 tablet 1  . pantoprazole (PROTONIX) 40 MG tablet Take 40 mg by mouth daily.      No current facility-administered medications on file prior to visit.     PAST MEDICAL HISTORY: Past Medical History:  Diagnosis Date  . Abdominal pain, epigastric   . Anxiety   . Back pain   . Diabetes mellitus, type 2 ([place])   . Diverticulosis of colon without hemorrhage   . Duodenitis with hemorrhage   . Edema   . Fluid retention   . GERD (gastroesophageal reflux disease)   . Heart murmur   . Hyperlipidemia   . Hypertension   . Leg edema   . Mitral regurgitation   . Obesity, unspecified   . Osteopenia   . Panic disorder without agoraphobia   . Peripheral neuropathy   . Personal history of  malignant neoplasm of cervix uteri    CIN-3 1989, normal since, gets pap smears annually  . Personal history of other genital system and obstetric disorders(V13.29)   . Prediabetes   . Rash and other nonspecific skin eruption   . Reflux esophagitis   . Swallowing difficulty   . Unspecified hereditary and idiopathic peripheral neuropathy   . Unspecified sleep apnea    sleep study- [facility]  . Vitamin D deficiency     PAST SURGICAL HISTORY: Past Surgical History:  Procedure Laterality Date  . ABDOMINAL HYSTERECTOMY    . BREAST CYST ASPIRATION     right , benign  . COLONOSCOPY WITH PROPOFOL N/A 06/09/2018   Procedure: COLONOSCOPY WITH PROPOFOL;  Surgeon: Lollie Sails, MD;  Location: [facility] ENDOSCOPY;  Service: Endoscopy;  Laterality: N/A;  . DILATION AND CURETTAGE OF UTERUS    . ESOPHAGOGASTRODUODENOSCOPY N/A 06/09/2018   Procedure: ESOPHAGOGASTRODUODENOSCOPY (EGD);  Surgeon: Lollie Sails, MD;  Location:  [place] ENDOSCOPY;  Service: Endoscopy;  Laterality: N/A;  . RIGHT OOPHORECTOMY  2001   menopause, endometriosis s/p.  HRT  since- has not tolerated previous attempts to wean    SOCIAL HISTORY: Social History   Tobacco Use  . Smoking status: Never Smoker  . Smokeless tobacco: Never Used  Substance Use Topics  . Alcohol use: Yes    Alcohol/week: 0.0 standard drinks    Comment: rare  . Drug use: No    FAMILY HISTORY: Family History  Problem Relation Age of Onset  . Osteopenia Mother   . Hypothyroidism Mother   . Diabetes Mother   . Hyperlipidemia Mother   . Hypertension Mother   . Obesity Mother   . Diabetes Father   . Alzheimer's disease Father   . Coronary artery disease Father   . Obesity Father   . Cancer Neg Hx        breast, ovarian, colon  . Breast cancer Neg Hx     ROS: Review of Systems  Constitutional: Negative for weight loss.  Gastrointestinal: Negative for nausea and vomiting.  Musculoskeletal:       Negative muscle weakness   Endo/Heme/Allergies:       Negative hypoglycemia    PHYSICAL EXAM: Pt in no acute distress  RECENT LABS AND TESTS: BMET    Component Value Date/Time   NA 138 03/02/2019 1045   NA 138 08/29/2018 0916   NA 139 04/18/2012 0931   K 4.3 03/02/2019 1045   K 4.1 04/18/2012 0931   CL 105 03/02/2019 1045   CL 106 04/18/2012 0931   CO2 23 03/02/2019 1045   CO2 23 04/18/2012 0931   GLUCOSE 101 (H) 03/02/2019 1045   GLUCOSE 105 (H) 04/18/2012 0931   BUN 16 03/02/2019 1045   BUN 18 08/29/2018 0916   BUN 13 04/18/2012 0931   CREATININE 0.64 03/02/2019 1045   CREATININE 0.63 04/18/2012 0931   CALCIUM 9.3 03/02/2019 1045   CALCIUM 8.9 04/18/2012 0931   GFRNONAA 93 08/29/2018 0916   GFRNONAA >60 04/18/2012 0931   GFRAA 107 08/29/2018 0916   GFRAA >60 04/18/2012 0931   Lab Results  Component Value Date   HGBA1C 6.2 03/02/2019   HGBA1C 5.8 (H) 08/29/2018   HGBA1C 6.4 (H) 05/30/2018   HGBA1C 6.6 (H) 02/15/2018   HGBA1C 6.0 08/10/2017   Lab Results  Component Value Date   INSULIN 11.8 08/29/2018   INSULIN 20.8 05/30/2018   CBC    Component Value Date/Time   WBC 6.6 05/30/2018 1248   WBC 5.1 05/11/2013 0928   RBC 5.00 05/30/2018 1248   RBC 4.62 05/11/2013 0928   HGB 14.4 05/30/2018 1248   HCT 43.8 05/30/2018 1248   PLT 301.0 05/11/2013 0928   MCV 88 05/30/2018 1248   MCH 28.8 05/30/2018 1248   MCHC 32.9 05/30/2018 1248   MCHC 34.3 05/11/2013 0928   RDW 11.9 (L) 05/30/2018 1248   LYMPHSABS 2.2 05/30/2018 1248   MONOABS 0.5 05/11/2013 0928   EOSABS 0.1 05/30/2018 1248   BASOSABS 0.0 05/30/2018 1248   Iron/TIBC/Ferritin/ %Sat No results found for: IRON, TIBC, FERRITIN, IRONPCTSAT Lipid Panel     Component Value Date/Time   CHOL 154 03/02/2019 1045   CHOL 113 08/29/2018 0916   TRIG 110.0 03/02/2019 1045   HDL 51.80 03/02/2019 1045   HDL 36 (L) 08/29/2018 0916   CHOLHDL 3 03/02/2019 1045   VLDL 22.0 03/02/2019 1045   LDLCALC 80 03/02/2019 1045   LDLCALC 57  08/29/2018 0916   LDLDIRECT 97.0 08/11/2016 0853   Hepatic Function Panel     Component Value Date/Time   PROT 6.6 03/02/2019 1045   PROT 6.9 08/29/2018 0916   PROT 7.5 04/18/2012 0931   ALBUMIN 4.3 03/02/2019 1045   ALBUMIN 4.6 08/29/2018 0916   ALBUMIN 4.0 04/18/2012 0931   AST 15 03/02/2019 1045   AST 26 04/18/2012 0931   ALT 19 03/02/2019 1045   ALT 26 04/18/2012 0931   ALKPHOS 63 03/02/2019 1045   ALKPHOS 55 04/18/2012 0931   BILITOT 0.8 03/02/2019 1045   BILITOT 0.8 08/29/2018 0916   BILITOT 0.8 04/18/2012 0931      Component Value Date/Time   TSH 2.940 05/30/2018 1248   TSH 1.02 05/11/2013 0928      I, Trixie Dredge, am acting as transcriptionist for Dennard Nip, MD I have reviewed the above documentation for accuracy and completeness, and I agree with the above. -Dennard Nip, MD

## 2019-03-21 ENCOUNTER — Encounter (INDEPENDENT_AMBULATORY_CARE_PROVIDER_SITE_OTHER): Payer: Self-pay | Admitting: Family Medicine

## 2019-03-21 NOTE — Telephone Encounter (Signed)
FYI

## 2019-04-03 ENCOUNTER — Ambulatory Visit (INDEPENDENT_AMBULATORY_CARE_PROVIDER_SITE_OTHER): Payer: 59 | Admitting: Family Medicine

## 2019-04-03 ENCOUNTER — Other Ambulatory Visit: Payer: Self-pay

## 2019-04-03 ENCOUNTER — Encounter (INDEPENDENT_AMBULATORY_CARE_PROVIDER_SITE_OTHER): Payer: Self-pay | Admitting: Family Medicine

## 2019-04-03 VITALS — BP 126/86 | HR 97 | Temp 97.8°F | Ht 63.0 in | Wt 216.0 lb

## 2019-04-03 DIAGNOSIS — Z6838 Body mass index (BMI) 38.0-38.9, adult: Secondary | ICD-10-CM

## 2019-04-03 DIAGNOSIS — E559 Vitamin D deficiency, unspecified: Secondary | ICD-10-CM | POA: Diagnosis not present

## 2019-04-03 DIAGNOSIS — Z9189 Other specified personal risk factors, not elsewhere classified: Secondary | ICD-10-CM

## 2019-04-03 DIAGNOSIS — E1140 Type 2 diabetes mellitus with diabetic neuropathy, unspecified: Secondary | ICD-10-CM

## 2019-04-03 MED ORDER — VITAMIN D (ERGOCALCIFEROL) 1.25 MG (50000 UNIT) PO CAPS: 50000.0000 [IU] | ORAL_CAPSULE | ORAL | 0 refills | Status: DC

## 2019-04-03 MED ORDER — METFORMIN HCL 500 MG PO TABS: 500.0000 mg | ORAL_TABLET | Freq: Every day | ORAL | 0 refills | Status: DC

## 2019-04-04 NOTE — Progress Notes (Signed)
Office: [phone]  /  Fax: [phone]   HPI:   Chief Complaint: OBESITY Sheri [name] is here to discuss her progress with her obesity treatment plan. She is on the lower carbohydrate, vegetable and lean protein rich diet plan and is following her eating plan approximately 0 % of the time. She states she is exercising 0 minutes 0 times per week. Sheri [name] has gained 21 lbs in the last 4 months since her last in office visit. She has had increased celebration eating and stress eating, but she is ready to get back on track.  Her weight is 216 lb (98 kg) today and has gained 21 lbs since her last visit. She has lost 11 lbs since starting treatment with [name].  Pre-Diabetes Sheri [name] has a diagnosis of pre-diabetes based on her elevated Hgb A1c and was informed this puts her at greater risk of developing diabetes. She is stable on metformin and denies nausea, vomiting, or hypoglycemia. She has struggled with eating and weight gain, but she is ready to get back on track. She will continue to work on diet and exercise to decrease risk of diabetes.   At risk for diabetes Sheri [name] is at higher than average risk for developing diabetes due to her obesity and pre-diabetes. She currently denies polyuria or polydipsia.  Vitamin D Deficiency Sheri [name] has a diagnosis of vitamin D deficiency. She is stable on prescription Vit D and denies nausea, vomiting or muscle weakness.  ASSESSMENT AND PLAN:  Type 2 diabetes mellitus with diabetic neuropathy, without long-term current use of insulin ([place]) - Plan: metFORMIN (GLUCOPHAGE) 500 MG tablet  Vitamin D deficiency - Plan: Vitamin D, Ergocalciferol, (DRISDOL) 1.25 MG (50000 UT) CAPS capsule  At risk for diabetes mellitus  Class 2 severe obesity with serious comorbidity and body mass index (BMI) of 38.0 to 38.9 in adult, unspecified obesity type ([place])  PLAN:  Pre-Diabetes Sheri [name] will continue to work on weight loss, exercise, and decreasing simple carbohydrates in her diet to help decrease  the risk of diabetes. We dicussed metformin including benefits and risks. She was informed that eating too many simple carbohydrates or too many calories at one sitting increases the likelihood of GI side effects. [name] agrees to continue taking metformin 500 mg q AM #30 and we will refill for 1 month. Sheri [name] agrees to follow up with our clinic in 2 weeks as directed to monitor her progress.  Diabetes risk counseling Sheri [name] was given extended (15 minutes) diabetes prevention counseling today. She is 63 y.o. female and has risk factors for diabetes including obesity and pre-diabetes. We discussed intensive lifestyle modifications today with an emphasis on weight loss as well as increasing exercise and decreasing simple carbohydrates in her diet.  Vitamin D Deficiency Sheri [name] was informed that low vitamin D levels contributes to fatigue and are associated with obesity, breast, and colon cancer. Sheri [name] agrees to continue taking prescription Vit D 50,000 IU every week #4 and we will refill for 1 month. She will follow up for routine testing of vitamin D, at least 2-3 times per year. She was informed of the risk of over-replacement of vitamin D and agrees to not increase her dose unless she discusses this with [name] first. Sheri [name] agrees to follow up with our clinic in 2 weeks.  Obesity Sheri [name] is currently in the action stage of change. As such, her goal is to continue with weight loss efforts She has agreed to follow the Category 2 plan Sheri [name] has been instructed to work up to  a goal of 150 minutes of combined cardio and strengthening exercise per week for weight loss and overall health benefits. We discussed the following Behavioral Modification Strategies today: increasing lean protein intake, work on meal planning and easy cooking plans, keeping healthy foods in the home, and better snacking choices   Sheri [name] has agreed to follow up with our clinic in 2 weeks. She was informed of the importance of frequent follow up visits to maximize her  success with intensive lifestyle modifications for her multiple health conditions.  ALLERGIES: No Known Allergies  MEDICATIONS: Current Outpatient Medications on File Prior to Visit  Medication Sig Dispense Refill  . ALPRAZolam (XANAX) 0.5 MG tablet TAKE 1 TABLET BY MOUTH TWICE DAILY AS NEEDED FOR ANXIETY OR SLEEP 45 tablet 5  . amitriptyline (ELAVIL) 25 MG tablet TAKE 3 TABLETS (75 MG TOTAL) BY MOUTH AT BEDTIME FOR NEUROPATHY 270 tablet 1  . atorvastatin (LIPITOR) 20 MG tablet TAKE 1 TABLET BY MOUTH DAILY. 90 tablet 3  . furosemide (LASIX) 20 MG tablet TAKE 1 TABLET BY MOUTH ONCE DAILY AS NEEDED FOR FLUID RETENTION 30 tablet 3  . losartan-hydrochlorothiazide (HYZAAR) 50-12.5 MG tablet Take 1 tablet by mouth at bedtime. 90 tablet 3  . pantoprazole (PROTONIX) 40 MG tablet Take 40 mg by mouth daily.      No current facility-administered medications on file prior to visit.     PAST MEDICAL HISTORY: Past Medical History:  Diagnosis Date  . Abdominal pain, epigastric   . Anxiety   . Back pain   . Diabetes mellitus, type 2 ([place])   . Diverticulosis of colon without hemorrhage   . Duodenitis with hemorrhage   . Edema   . Fluid retention   . GERD (gastroesophageal reflux disease)   . Heart murmur   . Hyperlipidemia   . Hypertension   . Leg edema   . Mitral regurgitation   . Obesity, unspecified   . Osteopenia   . Panic disorder without agoraphobia   . Peripheral neuropathy   . Personal history of malignant neoplasm of cervix uteri    CIN-3 1989, normal since, gets pap smears annually  . Personal history of other genital system and obstetric disorders(V13.29)   . Prediabetes   . Rash and other nonspecific skin eruption   . Reflux esophagitis   . Swallowing difficulty   . Unspecified hereditary and idiopathic peripheral neuropathy   . Unspecified sleep apnea    sleep study- [facility]  . Vitamin D deficiency     PAST SURGICAL HISTORY: Past Surgical History:  Procedure Laterality  Date  . ABDOMINAL HYSTERECTOMY    . BREAST CYST ASPIRATION     right , benign  . COLONOSCOPY WITH PROPOFOL N/A 06/09/2018   Procedure: COLONOSCOPY WITH PROPOFOL;  Surgeon: Lollie Sails, MD;  Location: [facility] ENDOSCOPY;  Service: Endoscopy;  Laterality: N/A;  . DILATION AND CURETTAGE OF UTERUS    . ESOPHAGOGASTRODUODENOSCOPY N/A 06/09/2018   Procedure: ESOPHAGOGASTRODUODENOSCOPY (EGD);  Surgeon: Lollie Sails, MD;  Location: [facility] ENDOSCOPY;  Service: Endoscopy;  Laterality: N/A;  . RIGHT OOPHORECTOMY  2001   menopause, endometriosis s/p.  HRT  since- has not tolerated previous attempts to wean    SOCIAL HISTORY: Social History   Tobacco Use  . Smoking status: Never Smoker  . Smokeless tobacco: Never Used  Substance Use Topics  . Alcohol use: Yes    Alcohol/week: 0.0 standard drinks    Comment: rare  . Drug use: No    FAMILY HISTORY:  Family History  Problem Relation Age of Onset  . Osteopenia Mother   . Hypothyroidism Mother   . Diabetes Mother   . Hyperlipidemia Mother   . Hypertension Mother   . Obesity Mother   . Diabetes Father   . Alzheimer's disease Father   . Coronary artery disease Father   . Obesity Father   . Cancer Neg Hx        breast, ovarian, colon  . Breast cancer Neg Hx     ROS: Review of Systems  Constitutional: Negative for weight loss.  Gastrointestinal: Negative for nausea and vomiting.  Genitourinary: Negative for frequency.  Musculoskeletal:       Negative muscle weakness  Endo/Heme/Allergies: Negative for polydipsia.       Negative hypoglycemia    PHYSICAL EXAM: Blood pressure 126/86, pulse 97, temperature 97.8 F (36.6 C), temperature source Oral, height 5\' 3"  (1.6 m), weight 216 lb (98 kg), SpO2 97 %. Body mass index is 38.26 kg/m. Physical Exam Vitals signs reviewed.  Constitutional:      Appearance: Normal appearance. She is obese.  Cardiovascular:     Rate and Rhythm: Normal rate.     Pulses: Normal pulses.   Pulmonary:     Effort: Pulmonary effort is normal.     Breath sounds: Normal breath sounds.  Musculoskeletal: Normal range of motion.  Skin:    General: Skin is warm and dry.  Neurological:     Mental Status: She is alert and oriented to person, place, and time.  Psychiatric:        Mood and Affect: Mood normal.        Behavior: Behavior normal.     RECENT LABS AND TESTS: BMET    Component Value Date/Time   NA 138 03/02/2019 1045   NA 138 08/29/2018 0916   NA 139 04/18/2012 0931   K 4.3 03/02/2019 1045   K 4.1 04/18/2012 0931   CL 105 03/02/2019 1045   CL 106 04/18/2012 0931   CO2 23 03/02/2019 1045   CO2 23 04/18/2012 0931   GLUCOSE 101 (H) 03/02/2019 1045   GLUCOSE 105 (H) 04/18/2012 0931   BUN 16 03/02/2019 1045   BUN 18 08/29/2018 0916   BUN 13 04/18/2012 0931   CREATININE 0.64 03/02/2019 1045   CREATININE 0.63 04/18/2012 0931   CALCIUM 9.3 03/02/2019 1045   CALCIUM 8.9 04/18/2012 0931   GFRNONAA 93 08/29/2018 0916   GFRNONAA >60 04/18/2012 0931   GFRAA 107 08/29/2018 0916   GFRAA >60 04/18/2012 0931   Lab Results  Component Value Date   HGBA1C 6.2 03/02/2019   HGBA1C 5.8 (H) 08/29/2018   HGBA1C 6.4 (H) 05/30/2018   HGBA1C 6.6 (H) 02/15/2018   HGBA1C 6.0 08/10/2017   Lab Results  Component Value Date   INSULIN 11.8 08/29/2018   INSULIN 20.8 05/30/2018   CBC    Component Value Date/Time   WBC 6.6 05/30/2018 1248   WBC 5.1 05/11/2013 0928   RBC 5.00 05/30/2018 1248   RBC 4.62 05/11/2013 0928   HGB 14.4 05/30/2018 1248   HCT 43.8 05/30/2018 1248   PLT 301.0 05/11/2013 0928   MCV 88 05/30/2018 1248   MCH 28.8 05/30/2018 1248   MCHC 32.9 05/30/2018 1248   MCHC 34.3 05/11/2013 0928   RDW 11.9 (L) 05/30/2018 1248   LYMPHSABS 2.2 05/30/2018 1248   MONOABS 0.5 05/11/2013 0928   EOSABS 0.1 05/30/2018 1248   BASOSABS 0.0 05/30/2018 1248   Iron/TIBC/Ferritin/ %Sat No results  found for: IRON, TIBC, FERRITIN, IRONPCTSAT Lipid Panel     Component  Value Date/Time   CHOL 154 03/02/2019 1045   CHOL 113 08/29/2018 0916   TRIG 110.0 03/02/2019 1045   HDL 51.80 03/02/2019 1045   HDL 36 (L) 08/29/2018 0916   CHOLHDL 3 03/02/2019 1045   VLDL 22.0 03/02/2019 1045   LDLCALC 80 03/02/2019 1045   LDLCALC 57 08/29/2018 0916   LDLDIRECT 97.0 08/11/2016 0853   Hepatic Function Panel     Component Value Date/Time   PROT 6.6 03/02/2019 1045   PROT 6.9 08/29/2018 0916   PROT 7.5 04/18/2012 0931   ALBUMIN 4.3 03/02/2019 1045   ALBUMIN 4.6 08/29/2018 0916   ALBUMIN 4.0 04/18/2012 0931   AST 15 03/02/2019 1045   AST 26 04/18/2012 0931   ALT 19 03/02/2019 1045   ALT 26 04/18/2012 0931   ALKPHOS 63 03/02/2019 1045   ALKPHOS 55 04/18/2012 0931   BILITOT 0.8 03/02/2019 1045   BILITOT 0.8 08/29/2018 0916   BILITOT 0.8 04/18/2012 0931      Component Value Date/Time   TSH 2.940 05/30/2018 1248   TSH 1.02 05/11/2013 0928      OBESITY BEHAVIORAL INTERVENTION VISIT  Today's visit was # 13   Starting weight: 227 lbs Starting date: 05/30/18 Today's weight : 216 lbs  Today's date: 04/03/2019 Total lbs lost to date: 11    ASK: We discussed the diagnosis of obesity with Aleen Sells Burkett today and Emer agreed to give [name] permission to discuss obesity behavioral modification therapy today.  ASSESS: Elysse has the diagnosis of obesity and her BMI today is 38.27 Marishka is in the action stage of change   ADVISE: Angelic was educated on the multiple health risks of obesity as well as the benefit of weight loss to improve her health. She was advised of the need for long term treatment and the importance of lifestyle modifications to improve her current health and to decrease her risk of future health problems.  AGREE: Multiple dietary modification options and treatment options were discussed and  Theta agreed to follow the recommendations documented in the above note.  ARRANGE: Talya was educated on the importance of frequent visits to treat obesity as outlined  per CMS and USPSTF guidelines and agreed to schedule her next follow up appointment today.  I, Trixie Dredge, am acting as transcriptionist for Dennard Nip, MD I have reviewed the above documentation for accuracy and completeness, and I agree with the above. -Dennard Nip, MD

## 2019-04-17 ENCOUNTER — Ambulatory Visit (INDEPENDENT_AMBULATORY_CARE_PROVIDER_SITE_OTHER): Payer: 59 | Admitting: Family Medicine

## 2019-04-17 ENCOUNTER — Encounter (INDEPENDENT_AMBULATORY_CARE_PROVIDER_SITE_OTHER): Payer: Self-pay | Admitting: Family Medicine

## 2019-04-17 ENCOUNTER — Other Ambulatory Visit: Payer: Self-pay

## 2019-04-17 VITALS — BP 126/76 | HR 116 | Temp 98.0°F | Ht 63.0 in | Wt 206.0 lb

## 2019-04-17 DIAGNOSIS — K5909 Other constipation: Secondary | ICD-10-CM

## 2019-04-17 DIAGNOSIS — Z9189 Other specified personal risk factors, not elsewhere classified: Secondary | ICD-10-CM | POA: Diagnosis not present

## 2019-04-17 DIAGNOSIS — Z6836 Body mass index (BMI) 36.0-36.9, adult: Secondary | ICD-10-CM

## 2019-04-17 MED ORDER — POLYETHYLENE GLYCOL 3350 17 GM/SCOOP PO POWD: 17.0000 g | Freq: Every day | ORAL | 0 refills | Status: AC

## 2019-04-18 NOTE — Progress Notes (Signed)
Office: [phone]  /  Fax: [phone]   HPI:   Chief Complaint: OBESITY Sheri [name] is here to discuss her progress with her obesity treatment plan. She is following a lower carbohydrate, vegetable and lean protein rich diet plan and is following her eating plan approximately 99.5% of the time. She states she is walking 45 minutes 3 times per week. Sheri [name] has done very well with weight loss on the low carb eating plan. She noted feeling lightheaded 1 day after walking in the heat. She states hunger is controlled overall and she is very happy with her weight loss. Her weight is 206 lb (93.4 kg) today and has had a weight loss of 10 pounds over a period of 2 weeks since her last visit. She has lost 21 lbs since starting treatment with [name].  Constipation Sheri [name] notes decreased bowel movement frequency since starting on the low carb meal plan. She has increased her water intake and this has not improved with the use of Senokot.  At risk for diabetes Sheri [name] is at higher than average risk for developing diabetes due to her obesity. She currently denies polyuria or polydipsia.  ASSESSMENT AND PLAN:  Other constipation - Plan: polyethylene glycol powder (GLYCOLAX/MIRALAX) 17 GM/SCOOP powder  At risk for diabetes mellitus  Class 2 severe obesity with serious comorbidity and body mass index (BMI) of 36.0 to 36.9 in adult, unspecified obesity type ([place])  PLAN:  Constipation Sheri [name] will start OTC MiraLax 17 grams daily x30 days and will continue to increase her water intake. She agrees to follow-up with our clinic in 2 weeks to monitor her progress.  Diabetes risk counseling Sheri [name] was given extended (15 minutes) diabetes prevention counseling today. She is 63 y.o. female and has risk factors for diabetes including obesity. We discussed intensive lifestyle modifications today with an emphasis on weight loss as well as increasing exercise and decreasing simple carbohydrates in her diet.  Obesity Sheri [name] is currently in  the action stage of change. As such, her goal is to continue with weight loss efforts. She has agreed to follow a lower carbohydrate, vegetable and lean protein rich diet plan. Sheri [name] has been instructed to work up to a goal of 150 minutes of combined cardio and strengthening exercise per week for weight loss and overall health benefits. We discussed the following Behavioral Modification Strategies today: increase water intake.  Sheri [name] has agreed to follow-up with our clinic in 2 weeks. She was informed of the importance of frequent follow-up visits to maximize her success with intensive lifestyle modifications for her multiple health conditions.  ALLERGIES: No Known Allergies  MEDICATIONS: Current Outpatient Medications on File Prior to Visit  Medication Sig Dispense Refill   ALPRAZolam (XANAX) 0.5 MG tablet TAKE 1 TABLET BY MOUTH TWICE DAILY AS NEEDED FOR ANXIETY OR SLEEP 45 tablet 5   amitriptyline (ELAVIL) 25 MG tablet TAKE 3 TABLETS (75 MG TOTAL) BY MOUTH AT BEDTIME FOR NEUROPATHY 270 tablet 1   atorvastatin (LIPITOR) 20 MG tablet TAKE 1 TABLET BY MOUTH DAILY. 90 tablet 3   furosemide (LASIX) 20 MG tablet TAKE 1 TABLET BY MOUTH ONCE DAILY AS NEEDED FOR FLUID RETENTION 30 tablet 3   losartan-hydrochlorothiazide (HYZAAR) 50-12.5 MG tablet Take 1 tablet by mouth at bedtime. 90 tablet 3   metFORMIN (GLUCOPHAGE) 500 MG tablet Take 1 tablet (500 mg total) by mouth daily with breakfast. 30 tablet 0   pantoprazole (PROTONIX) 40 MG tablet Take 40 mg by mouth daily.  Vitamin D, Ergocalciferol, (DRISDOL) 1.25 MG (50000 UT) CAPS capsule Take 1 capsule (50,000 Units total) by mouth every 7 (seven) days. 4 capsule 0   No current facility-administered medications on file prior to visit.     PAST MEDICAL HISTORY: Past Medical History:  Diagnosis Date   Abdominal pain, epigastric    Anxiety    Back pain    Diabetes mellitus, type 2 (HCC)    Diverticulosis of colon without hemorrhage      Duodenitis with hemorrhage    Edema    Fluid retention    GERD (gastroesophageal reflux disease)    Heart murmur    Hyperlipidemia    Hypertension    Leg edema    Mitral regurgitation    Obesity, unspecified    Osteopenia    Panic disorder without agoraphobia    Peripheral neuropathy    Personal history of malignant neoplasm of cervix uteri    CIN-3 1989, normal since, gets pap smears annually   Personal history of other genital system and obstetric disorders(V13.29)    Prediabetes    Rash and other nonspecific skin eruption    Reflux esophagitis    Swallowing difficulty    Unspecified hereditary and idiopathic peripheral neuropathy    Unspecified sleep apnea    sleep study- [facility]   Vitamin D deficiency     PAST SURGICAL HISTORY: Past Surgical History:  Procedure Laterality Date   ABDOMINAL HYSTERECTOMY     BREAST CYST ASPIRATION     right , benign   COLONOSCOPY WITH PROPOFOL N/A 06/09/2018   Procedure: COLONOSCOPY WITH PROPOFOL;  Surgeon: Lollie Sails, MD;  Location: [facility] ENDOSCOPY;  Service: Endoscopy;  Laterality: N/A;   DILATION AND CURETTAGE OF UTERUS     ESOPHAGOGASTRODUODENOSCOPY N/A 06/09/2018   Procedure: ESOPHAGOGASTRODUODENOSCOPY (EGD);  Surgeon: Lollie Sails, MD;  Location: [facility] ENDOSCOPY;  Service: Endoscopy;  Laterality: N/A;   RIGHT OOPHORECTOMY  2001   menopause, endometriosis s/p.  HRT  since- has not tolerated previous attempts to wean    SOCIAL HISTORY: Social History   Tobacco Use   Smoking status: Never Smoker   Smokeless tobacco: Never Used  Substance Use Topics   Alcohol use: Yes    Alcohol/week: 0.0 standard drinks    Comment: rare   Drug use: No    FAMILY HISTORY: Family History  Problem Relation Age of Onset   Osteopenia Mother    Hypothyroidism Mother    Diabetes Mother    Hyperlipidemia Mother    Hypertension Mother    Obesity Mother    Diabetes Father    Alzheimer's disease  Father    Coronary artery disease Father    Obesity Father    Cancer Neg Hx        breast, ovarian, colon   Breast cancer Neg Hx     ROS: Review of Systems  Gastrointestinal: Positive for constipation.    PHYSICAL EXAM: Blood pressure 126/76, pulse (!) 116, temperature 98 F (36.7 C), temperature source Oral, height 5\' 3"  (1.6 m), weight 206 lb (93.4 kg), SpO2 96 %. Body mass index is 36.49 kg/m. Physical Exam Vitals signs reviewed.  Constitutional:      Appearance: Normal appearance. She is obese.  Cardiovascular:     Rate and Rhythm: Normal rate.     Pulses: Normal pulses.  Pulmonary:     Effort: Pulmonary effort is normal.     Breath sounds: Normal breath sounds.  Musculoskeletal: Normal range of motion.  Skin:  General: Skin is warm and dry.  Neurological:     Mental Status: She is alert and oriented to person, place, and time.  Psychiatric:        Behavior: Behavior normal.   RECENT LABS AND TESTS: BMET    Component Value Date/Time   NA 138 03/02/2019 1045   NA 138 08/29/2018 0916   NA 139 04/18/2012 0931   K 4.3 03/02/2019 1045   K 4.1 04/18/2012 0931   CL 105 03/02/2019 1045   CL 106 04/18/2012 0931   CO2 23 03/02/2019 1045   CO2 23 04/18/2012 0931   GLUCOSE 101 (H) 03/02/2019 1045   GLUCOSE 105 (H) 04/18/2012 0931   BUN 16 03/02/2019 1045   BUN 18 08/29/2018 0916   BUN 13 04/18/2012 0931   CREATININE 0.64 03/02/2019 1045   CREATININE 0.63 04/18/2012 0931   CALCIUM 9.3 03/02/2019 1045   CALCIUM 8.9 04/18/2012 0931   GFRNONAA 93 08/29/2018 0916   GFRNONAA >60 04/18/2012 0931   GFRAA 107 08/29/2018 0916   GFRAA >60 04/18/2012 0931   Lab Results  Component Value Date   HGBA1C 6.2 03/02/2019   HGBA1C 5.8 (H) 08/29/2018   HGBA1C 6.4 (H) 05/30/2018   HGBA1C 6.6 (H) 02/15/2018   HGBA1C 6.0 08/10/2017   Lab Results  Component Value Date   INSULIN 11.8 08/29/2018   INSULIN 20.8 05/30/2018   CBC    Component Value Date/Time   WBC 6.6  05/30/2018 1248   WBC 5.1 05/11/2013 0928   RBC 5.00 05/30/2018 1248   RBC 4.62 05/11/2013 0928   HGB 14.4 05/30/2018 1248   HCT 43.8 05/30/2018 1248   PLT 301.0 05/11/2013 0928   MCV 88 05/30/2018 1248   MCH 28.8 05/30/2018 1248   MCHC 32.9 05/30/2018 1248   MCHC 34.3 05/11/2013 0928   RDW 11.9 (L) 05/30/2018 1248   LYMPHSABS 2.2 05/30/2018 1248   MONOABS 0.5 05/11/2013 0928   EOSABS 0.1 05/30/2018 1248   BASOSABS 0.0 05/30/2018 1248   Iron/TIBC/Ferritin/ %Sat No results found for: IRON, TIBC, FERRITIN, IRONPCTSAT Lipid Panel     Component Value Date/Time   CHOL 154 03/02/2019 1045   CHOL 113 08/29/2018 0916   TRIG 110.0 03/02/2019 1045   HDL 51.80 03/02/2019 1045   HDL 36 (L) 08/29/2018 0916   CHOLHDL 3 03/02/2019 1045   VLDL 22.0 03/02/2019 1045   LDLCALC 80 03/02/2019 1045   LDLCALC 57 08/29/2018 0916   LDLDIRECT 97.0 08/11/2016 0853   Hepatic Function Panel     Component Value Date/Time   PROT 6.6 03/02/2019 1045   PROT 6.9 08/29/2018 0916   PROT 7.5 04/18/2012 0931   ALBUMIN 4.3 03/02/2019 1045   ALBUMIN 4.6 08/29/2018 0916   ALBUMIN 4.0 04/18/2012 0931   AST 15 03/02/2019 1045   AST 26 04/18/2012 0931   ALT 19 03/02/2019 1045   ALT 26 04/18/2012 0931   ALKPHOS 63 03/02/2019 1045   ALKPHOS 55 04/18/2012 0931   BILITOT 0.8 03/02/2019 1045   BILITOT 0.8 08/29/2018 0916   BILITOT 0.8 04/18/2012 0931      Component Value Date/Time   TSH 2.940 05/30/2018 1248   TSH 1.02 05/11/2013 0928   Results for Fadness, Sheri [name] (MRN [id number]) as of 04/18/2019 09:52  Ref. Range 08/29/2018 09:16  Vitamin D, 25-Hydroxy Latest Ref Range: 30.0 - 100.0 ng/mL 63.9   OBESITY BEHAVIORAL INTERVENTION VISIT  Today's visit was #14  Starting weight: 227 lbs Starting date: 05/30/2018 Today's weight: 206  lbs  Today's date: 04/17/2019 Total lbs lost to date: 21   04/17/2019  Height 5\' 3"  (1.6 m)  Weight 206 lb (93.4 kg)  BMI (Calculated) 36.5  BLOOD PRESSURE - SYSTOLIC  163  BLOOD PRESSURE - DIASTOLIC 76   Body Fat % 84.5 %  Total Body Water (lbs) 81.4 lbs   ASK: We discussed the diagnosis of obesity with Sheri [name] today and Sheri [name] agreed to give [name] permission to discuss obesity behavioral modification therapy today.  ASSESS: Sheri [name] has the diagnosis of obesity and her BMI today is 36.5. Mally is in the action stage of change.   ADVISE: Sheri [name] was educated on the multiple health risks of obesity as well as the benefit of weight loss to improve her health. She was advised of the need for long term treatment and the importance of lifestyle modifications to improve her current health and to decrease her risk of future health problems.  AGREE: Multiple dietary modification options and treatment options were discussed and  Sheri [name] agreed to follow the recommendations documented in the above note.  ARRANGE: Sheri [name] was educated on the importance of frequent visits to treat obesity as outlined per CMS and USPSTF guidelines and agreed to schedule her next follow up appointment today.  I, Michaelene Song, am acting as Location manager for Dennard Nip, MD  I have reviewed the above documentation for accuracy and completeness, and I agree with the above. -Dennard Nip, MD

## 2019-05-01 ENCOUNTER — Encounter (INDEPENDENT_AMBULATORY_CARE_PROVIDER_SITE_OTHER): Payer: Self-pay | Admitting: Family Medicine

## 2019-05-01 ENCOUNTER — Other Ambulatory Visit: Payer: Self-pay

## 2019-05-01 ENCOUNTER — Ambulatory Visit (INDEPENDENT_AMBULATORY_CARE_PROVIDER_SITE_OTHER): Payer: 59 | Admitting: Family Medicine

## 2019-05-01 VITALS — BP 113/79 | HR 132 | Temp 97.7°F | Ht 63.0 in | Wt 208.0 lb

## 2019-05-01 DIAGNOSIS — F3289 Other specified depressive episodes: Secondary | ICD-10-CM

## 2019-05-01 DIAGNOSIS — Z9189 Other specified personal risk factors, not elsewhere classified: Secondary | ICD-10-CM

## 2019-05-01 DIAGNOSIS — E559 Vitamin D deficiency, unspecified: Secondary | ICD-10-CM

## 2019-05-01 DIAGNOSIS — Z6836 Body mass index (BMI) 36.0-36.9, adult: Secondary | ICD-10-CM | POA: Diagnosis not present

## 2019-05-01 DIAGNOSIS — E1140 Type 2 diabetes mellitus with diabetic neuropathy, unspecified: Secondary | ICD-10-CM | POA: Diagnosis not present

## 2019-05-01 MED ORDER — METFORMIN HCL 500 MG PO TABS: 500.0000 mg | ORAL_TABLET | Freq: Every day | ORAL | 0 refills | Status: DC

## 2019-05-01 MED ORDER — VITAMIN D (ERGOCALCIFEROL) 1.25 MG (50000 UNIT) PO CAPS: 50000.0000 [IU] | ORAL_CAPSULE | ORAL | 0 refills | Status: DC

## 2019-05-01 MED ORDER — BUPROPION HCL ER (SR) 150 MG PO TB12: 150.0000 mg | ORAL_TABLET | Freq: Every day | ORAL | 0 refills | Status: DC

## 2019-05-02 ENCOUNTER — Other Ambulatory Visit (INDEPENDENT_AMBULATORY_CARE_PROVIDER_SITE_OTHER): Payer: Self-pay | Admitting: Family Medicine

## 2019-05-02 DIAGNOSIS — E1140 Type 2 diabetes mellitus with diabetic neuropathy, unspecified: Secondary | ICD-10-CM

## 2019-05-02 DIAGNOSIS — E559 Vitamin D deficiency, unspecified: Secondary | ICD-10-CM

## 2019-05-03 NOTE — Progress Notes (Signed)
Office: [phone]  /  Fax: [phone]   HPI:   Chief Complaint: OBESITY Sheri [name] is here to discuss her progress with her obesity treatment plan. She is on the Low Carb/Category 3 plan and is following her eating plan approximately 50% of the time. She states she is walking 45 minutes 3 times per week. Sheri [name] has been off track with diet due to increased stress recently. She is frustrated caring for her elderly mother and not having much time to meal prep. She thinks she will be able to do better during the next 2-3 weeks.  Her weight is 208 lb (94.3 kg) today and has had a weight gain of 2 lbs since her last visit. She has lost 19 lbs since starting treatment with [name].  Depression, Other Sheri [name] notes increased stress caring for her mother and dealing with COVID-19 isolation. She is frustrated with increased irritability and decreased sleep. She shows no sign of suicidal or homicidal ideations.  Depression screen [facility] 2/9 10/17/2018 09/26/2018 08/29/2018 08/03/2018 06/29/2018  Decreased Interest 0 0 0 0 0  Down, Depressed, Hopeless 0 0 0 0 0  PHQ - 2 Score 0 0 0 0 0  Altered sleeping 0 0 0 0 1  Tired, decreased energy 0 0 0 0 0  Change in appetite 0 0 0 0 0  Feeling bad or failure about yourself  0 0 0 0 0  Trouble concentrating 0 0 0 0 0  Moving slowly or fidgety/restless 0 0 0 0 0  Suicidal thoughts 0 0 0 0 0  PHQ-9 Score 0 0 0 0 1  Difficult doing work/chores - - - - -   Pre-Diabetes Sheri [name] has a diagnosis of prediabetes based on her elevated Hgb A1c and was informed this puts her at greater risk of developing diabetes. She has struggled with her eating plan more recently. She is stable on metformin currently and continues to work on diet and exercise to decrease risk of diabetes. She denies nausea, vomiting, or hypoglycemia.  At risk for diabetes Sheri [name] is at higher than average risk for developing diabetes due to her obesity. She currently denies polyuria or polydipsia.  Vitamin D deficiency Sheri [name]  has a diagnosis of Vitamin D deficiency, which is not yet at goal. She is currently stable on prescription Vit D and denies nausea, vomiting or muscle weakness.  ASSESSMENT AND PLAN:  Vitamin D deficiency - Plan: Vitamin D, Ergocalciferol, (DRISDOL) 1.25 MG (50000 UT) CAPS capsule  Type 2 diabetes mellitus with diabetic neuropathy, without long-term current use of insulin (HCC) - Plan: metFORMIN (GLUCOPHAGE) 500 MG tablet  Other depression - Plan: buPROPion (WELLBUTRIN SR) 150 MG 12 hr tablet  At risk for diabetes mellitus  Class 2 severe obesity with serious comorbidity and body mass index (BMI) of 36.0 to 36.9 in adult, unspecified obesity type ([place])  PLAN:  Depression, Other  We discussed behavior modification techniques today to help Sheri [name] deal with her emotional eating and depression. Sheri [name] will start Wellbutrin SR 150 mg #30 with 0 refills and agrees to follow-up with our clinic in 2-3 weeks.  Pre-Diabetes Sheri [name] will continue to work on weight loss, exercise, and decreasing simple carbohydrates in her diet to help decrease the risk of diabetes. We dicussed metformin including benefits and risks. She was informed that eating too many simple carbohydrates or too many calories at one sitting increases the likelihood of GI side effects. Sheri [name] was given a refill on her metformin 500 mg #30 with 0  refills and agrees to follow-up with our clinic in 2-3 weeks.  Diabetes risk counseling Sheri [name] was given extended (15 minutes) diabetes prevention counseling today. She is 63 y.o. female and has risk factors for diabetes including obesity. We discussed intensive lifestyle modifications today with an emphasis on weight loss as well as increasing exercise and decreasing simple carbohydrates in her diet.  Vitamin D Deficiency Sheri [name] was informed that low Vitamin D levels contributes to fatigue and are associated with obesity, breast, and colon cancer. She agrees to continue to take prescription Vit D @ 50,000 IU every  week #4 with 0 refills and will follow-up for routine testing of Vitamin D, at least 2-3 times per year. She was informed of the risk of over-replacement of Vitamin D and agrees to not increase her dose unless she discusses this with [name] first. Sheri [name] agrees to follow-up with our clinic in 2-3 weeks.  Obesity Sheri [name] is currently in the action stage of change. As such, her goal is to continue with weight loss efforts. She has agreed to follow the Category 3 plan. Sheri [name] has been instructed to work up to a goal of 150 minutes of combined cardio and strengthening exercise per week for weight loss and overall health benefits. We discussed the following Behavioral Modification Strategies today: increasing lean protein intake, emotional eating strategies, and family/coworker sabotage.  Sheri [name] has agreed to follow-up with our clinic in 2-3 weeks. She was informed of the importance of frequent follow-up visits to maximize her success with intensive lifestyle modifications for her multiple health conditions.  ALLERGIES: No Known Allergies  MEDICATIONS: Current Outpatient Medications on File Prior to Visit  Medication Sig Dispense Refill  . ALPRAZolam (XANAX) 0.5 MG tablet TAKE 1 TABLET BY MOUTH TWICE DAILY AS NEEDED FOR ANXIETY OR SLEEP 45 tablet 5  . amitriptyline (ELAVIL) 25 MG tablet TAKE 3 TABLETS (75 MG TOTAL) BY MOUTH AT BEDTIME FOR NEUROPATHY 270 tablet 1  . atorvastatin (LIPITOR) 20 MG tablet TAKE 1 TABLET BY MOUTH DAILY. 90 tablet 3  . furosemide (LASIX) 20 MG tablet TAKE 1 TABLET BY MOUTH ONCE DAILY AS NEEDED FOR FLUID RETENTION 30 tablet 3  . losartan-hydrochlorothiazide (HYZAAR) 50-12.5 MG tablet Take 1 tablet by mouth at bedtime. 90 tablet 3  . pantoprazole (PROTONIX) 40 MG tablet Take 40 mg by mouth daily.     . polyethylene glycol powder (GLYCOLAX/MIRALAX) 17 GM/SCOOP powder Take 17 g by mouth daily for 198 doses. 3350 g 0   No current facility-administered medications on file prior to visit.      PAST MEDICAL HISTORY: Past Medical History:  Diagnosis Date  . Abdominal pain, epigastric   . Anxiety   . Back pain   . Diabetes mellitus, type 2 ([place])   . Diverticulosis of colon without hemorrhage   . Duodenitis with hemorrhage   . Edema   . Fluid retention   . GERD (gastroesophageal reflux disease)   . Heart murmur   . Hyperlipidemia   . Hypertension   . Leg edema   . Mitral regurgitation   . Obesity, unspecified   . Osteopenia   . Panic disorder without agoraphobia   . Peripheral neuropathy   . Personal history of malignant neoplasm of cervix uteri    CIN-3 1989, normal since, gets pap smears annually  . Personal history of other genital system and obstetric disorders(V13.29)   . Prediabetes   . Rash and other nonspecific skin eruption   . Reflux esophagitis   . Swallowing  difficulty   . Unspecified hereditary and idiopathic peripheral neuropathy   . Unspecified sleep apnea    sleep study- [facility]  . Vitamin D deficiency     PAST SURGICAL HISTORY: Past Surgical History:  Procedure Laterality Date  . ABDOMINAL HYSTERECTOMY    . BREAST CYST ASPIRATION     right , benign  . COLONOSCOPY WITH PROPOFOL N/A 06/09/2018   Procedure: COLONOSCOPY WITH PROPOFOL;  Surgeon: Sheri Sails, MD;  Location: [facility] ENDOSCOPY;  Service: Endoscopy;  Laterality: N/A;  . DILATION AND CURETTAGE OF UTERUS    . ESOPHAGOGASTRODUODENOSCOPY N/A 06/09/2018   Procedure: ESOPHAGOGASTRODUODENOSCOPY (EGD);  Surgeon: Sheri Sails, MD;  Location: [facility] ENDOSCOPY;  Service: Endoscopy;  Laterality: N/A;  . RIGHT OOPHORECTOMY  2001   menopause, endometriosis s/p.  HRT  since- has not tolerated previous attempts to wean    SOCIAL HISTORY: Social History   Tobacco Use  . Smoking status: Never Smoker  . Smokeless tobacco: Never Used  Substance Use Topics  . Alcohol use: Yes    Alcohol/week: 0.0 standard drinks    Comment: rare  . Drug use: No    FAMILY HISTORY: Family History  Problem  Relation Age of Onset  . Osteopenia Mother   . Hypothyroidism Mother   . Diabetes Mother   . Hyperlipidemia Mother   . Hypertension Mother   . Obesity Mother   . Diabetes Father   . Alzheimer's disease Father   . Coronary artery disease Father   . Obesity Father   . Cancer Neg Hx        breast, ovarian, colon  . Breast cancer Neg Hx    ROS: Review of Systems  Gastrointestinal: Negative for nausea and vomiting.  Musculoskeletal:       Negative for muscle weakness.  Endo/Heme/Allergies:       Negative for hypoglycemia.  Psychiatric/Behavioral: Positive for depression. Negative for suicidal ideas.       Negative for homicidal ideas.   PHYSICAL EXAM: Blood pressure 113/79, pulse (!) 132, temperature 97.7 F (36.5 C), temperature source Oral, height 5\' 3"  (1.6 m), weight 208 lb (94.3 kg), SpO2 95 %. Body mass index is 36.85 kg/m. Physical Exam Vitals signs reviewed.  Constitutional:      Appearance: Normal appearance. She is obese.  Cardiovascular:     Rate and Rhythm: Normal rate.     Pulses: Normal pulses.  Pulmonary:     Effort: Pulmonary effort is normal.     Breath sounds: Normal breath sounds.  Musculoskeletal: Normal range of motion.  Skin:    General: Skin is warm and dry.  Neurological:     Mental Status: She is alert and oriented to person, place, and time.  Psychiatric:        Behavior: Behavior normal.   RECENT LABS AND TESTS: BMET    Component Value Date/Time   NA 138 03/02/2019 1045   NA 138 08/29/2018 0916   NA 139 04/18/2012 0931   K 4.3 03/02/2019 1045   K 4.1 04/18/2012 0931   CL 105 03/02/2019 1045   CL 106 04/18/2012 0931   CO2 23 03/02/2019 1045   CO2 23 04/18/2012 0931   GLUCOSE 101 (H) 03/02/2019 1045   GLUCOSE 105 (H) 04/18/2012 0931   BUN 16 03/02/2019 1045   BUN 18 08/29/2018 0916   BUN 13 04/18/2012 0931   CREATININE 0.64 03/02/2019 1045   CREATININE 0.63 04/18/2012 0931   CALCIUM 9.3 03/02/2019 1045   CALCIUM 8.9 04/18/2012  [place] 08/29/2018 0916   GFRNONAA >60 04/18/2012 0931   GFRAA 107 08/29/2018 0916   GFRAA >60 04/18/2012 0931   Lab Results  Component Value Date   HGBA1C 6.2 03/02/2019   HGBA1C 5.8 (H) 08/29/2018   HGBA1C 6.4 (H) 05/30/2018   HGBA1C 6.6 (H) 02/15/2018   HGBA1C 6.0 08/10/2017   Lab Results  Component Value Date   INSULIN 11.8 08/29/2018   INSULIN 20.8 05/30/2018   CBC    Component Value Date/Time   WBC 6.6 05/30/2018 1248   WBC 5.1 05/11/2013 0928   RBC 5.00 05/30/2018 1248   RBC 4.62 05/11/2013 0928   HGB 14.4 05/30/2018 1248   HCT 43.8 05/30/2018 1248   PLT 301.0 05/11/2013 0928   MCV 88 05/30/2018 1248   MCH 28.8 05/30/2018 1248   MCHC 32.9 05/30/2018 1248   MCHC 34.3 05/11/2013 0928   RDW 11.9 (L) 05/30/2018 1248   LYMPHSABS 2.2 05/30/2018 1248   MONOABS 0.5 05/11/2013 0928   EOSABS 0.1 05/30/2018 1248   BASOSABS 0.0 05/30/2018 1248   Iron/TIBC/Ferritin/ %Sat No results found for: IRON, TIBC, FERRITIN, IRONPCTSAT Lipid Panel     Component Value Date/Time   CHOL 154 03/02/2019 1045   CHOL 113 08/29/2018 0916   TRIG 110.0 03/02/2019 1045   HDL 51.80 03/02/2019 1045   HDL 36 (L) 08/29/2018 0916   CHOLHDL 3 03/02/2019 1045   VLDL 22.0 03/02/2019 1045   LDLCALC 80 03/02/2019 1045   LDLCALC 57 08/29/2018 0916   LDLDIRECT 97.0 08/11/2016 0853   Hepatic Function Panel     Component Value Date/Time   PROT 6.6 03/02/2019 1045   PROT 6.9 08/29/2018 0916   PROT 7.5 04/18/2012 0931   ALBUMIN 4.3 03/02/2019 1045   ALBUMIN 4.6 08/29/2018 0916   ALBUMIN 4.0 04/18/2012 0931   AST 15 03/02/2019 1045   AST 26 04/18/2012 0931   ALT 19 03/02/2019 1045   ALT 26 04/18/2012 0931   ALKPHOS 63 03/02/2019 1045   ALKPHOS 55 04/18/2012 0931   BILITOT 0.8 03/02/2019 1045   BILITOT 0.8 08/29/2018 0916   BILITOT 0.8 04/18/2012 0931      Component Value Date/Time   TSH 2.940 05/30/2018 1248   TSH 1.02 05/11/2013 0928   Results for Huynh, Kianah ANN (MRN  [id number]) as of 05/03/2019 10:01  Ref. Range 08/29/2018 09:16  Vitamin D, 25-Hydroxy Latest Ref Range: 30.0 - 100.0 ng/mL 63.9   OBESITY BEHAVIORAL INTERVENTION VISIT  Today's visit was #15  Starting weight: 227 lbs Starting date: 05/30/2018 Today's weight: 208 lbs  Today's date: 05/01/2019 Total lbs lost to date: 19    05/01/2019  Height 5\' 3"  (1.6 m)  Weight 208 lb (94.3 kg)  BMI (Calculated) 36.85  BLOOD PRESSURE - SYSTOLIC 176  BLOOD PRESSURE - DIASTOLIC 79   Body Fat % 16.0 %  Total Body Water (lbs) 80.4 lbs   ASK: We discussed the diagnosis of obesity with Sheri [name] today and Marji agreed to give [name] permission to discuss obesity behavioral modification therapy today.  ASSESS: Lakeeta has the diagnosis of obesity and her BMI today is 36.9. Mistina is in the action stage of change.   ADVISE: Randall was educated on the multiple health risks of obesity as well as the benefit of weight loss to improve her health. She was advised of the need for long term treatment and the importance of lifestyle modifications to improve her current health and to decrease her risk  of future health problems.  AGREE: Multiple dietary modification options and treatment options were discussed and  Xcaret agreed to follow the recommendations documented in the above note.  ARRANGE: Lan was educated on the importance of frequent visits to treat obesity as outlined per CMS and USPSTF guidelines and agreed to schedule her next follow up appointment today.  I, Michaelene Song, am acting as Location manager for Dennard Nip, MD I have reviewed the above documentation for accuracy and completeness, and I agree with the above. -Dennard Nip, MD

## 2019-05-18 ENCOUNTER — Other Ambulatory Visit: Payer: Self-pay

## 2019-05-18 ENCOUNTER — Encounter: Payer: Self-pay | Admitting: Internal Medicine

## 2019-05-18 ENCOUNTER — Ambulatory Visit (INDEPENDENT_AMBULATORY_CARE_PROVIDER_SITE_OTHER): Payer: 59 | Admitting: Internal Medicine

## 2019-05-18 DIAGNOSIS — Z20828 Contact with and (suspected) exposure to other viral communicable diseases: Secondary | ICD-10-CM

## 2019-05-18 DIAGNOSIS — Z20822 Contact with and (suspected) exposure to covid-19: Secondary | ICD-10-CM

## 2019-05-18 NOTE — Progress Notes (Signed)
Pt stated that last Friday afternoon she started feeling sick. Symptoms were dull headache, chills, fatigued, loss taste, white coating over tongue. Covid-19 test done on Monday and it came back negative. Pt stated that she is still having symptoms of headache(sharp shooting pain), fatigue, taste is starting to come back.

## 2019-05-20 DIAGNOSIS — Z20828 Contact with and (suspected) exposure to other viral communicable diseases: Secondary | ICD-10-CM | POA: Insufficient documentation

## 2019-05-20 DIAGNOSIS — Z8616 Personal history of COVID-19: Secondary | ICD-10-CM | POA: Insufficient documentation

## 2019-05-20 NOTE — Progress Notes (Addendum)
Virtual Visit via [email or web address]  This visit type was conducted due to national recommendations for restrictions regarding the COVID-19 pandemic (e.g. social distancing).  This format is felt to be most appropriate for this patient at this time.  All issues noted in this document were discussed and addressed.  No physical exam was performed (except for noted visual exam findings with Video Visits).   I connected with@ on 05/18/19 at 11:00 AM EDT by a video enabled telemedicine application  and verified that I am speaking with the correct person using two identifiers. Location patient: home Location provider: work or home office Persons participating in the virtual visit: patient, provider  I discussed the limitations, risks, security and privacy concerns of performing an evaluation and management service by telephone and the availability of in person appointments. I also discussed with the patient that there may be a patient responsible charge related to this service. The patient expressed understanding and agreed to proceed.  Reason for visit: COVID SYMPTOMS , known  Exposure   HPI:  63 yr old female with recent prolonged exposure to [place] with 7 day history of left sided headaches  , chills decreased sense of taste   Increased fatigue, anorexia and nausea for the past weeks.  Headaches became severe , accompanied by dizziness yesterday but is improved today . No fevers.  Today is the first day that he did not develop severe headache .  COVID TESTING WAS DONE 5 days ago and negative    ROS: See pertinent positives and negatives per HPI.  Past Medical History:  Diagnosis Date  . Abdominal pain, epigastric   . Anxiety   . Back pain   . Diabetes mellitus, type 2 ([place])   . Diverticulosis of colon without hemorrhage   . Duodenitis with hemorrhage   . Edema   . Fluid retention   . GERD (gastroesophageal reflux disease)   . Heart murmur   . Hyperlipidemia   .  Hypertension   . Leg edema   . Mitral regurgitation   . Obesity, unspecified   . Osteopenia   . Panic disorder without agoraphobia   . Peripheral neuropathy   . Personal history of malignant neoplasm of cervix uteri    CIN-3 1989, normal since, gets pap smears annually  . Personal history of other genital system and obstetric disorders(V13.29)   . Prediabetes   . Rash and other nonspecific skin eruption   . Reflux esophagitis   . Swallowing difficulty   . Unspecified hereditary and idiopathic peripheral neuropathy   . Unspecified sleep apnea    sleep study- [facility]  . Vitamin D deficiency     Past Surgical History:  Procedure Laterality Date  . ABDOMINAL HYSTERECTOMY    . BREAST CYST ASPIRATION     right , benign  . COLONOSCOPY WITH PROPOFOL N/A 06/09/2018   Procedure: COLONOSCOPY WITH PROPOFOL;  Surgeon: Lollie Sails, MD;  Location: [facility] ENDOSCOPY;  Service: Endoscopy;  Laterality: N/A;  . DILATION AND CURETTAGE OF UTERUS    . ESOPHAGOGASTRODUODENOSCOPY N/A 06/09/2018   Procedure: ESOPHAGOGASTRODUODENOSCOPY (EGD);  Surgeon: Lollie Sails, MD;  Location: [facility] ENDOSCOPY;  Service: Endoscopy;  Laterality: N/A;  . RIGHT OOPHORECTOMY  2001   menopause, endometriosis s/p.  HRT  since- has not tolerated previous attempts to wean    Family History  Problem Relation Age of Onset  . Osteopenia Mother   . Hypothyroidism Mother   . Diabetes Mother   . Hyperlipidemia Mother   .  Hypertension Mother   . Obesity Mother   . Diabetes Father   . Alzheimer's disease Father   . Coronary artery disease Father   . Obesity Father   . Cancer Neg Hx        breast, ovarian, colon  . Breast cancer Neg Hx     SOCIAL HX: reports that she has never smoked. She has never used smokeless tobacco. She reports current alcohol use. She reports that she does not use drugs.   Current Outpatient Medications:  .  ALPRAZolam (XANAX) 0.5 MG tablet, TAKE 1 TABLET BY MOUTH TWICE DAILY AS NEEDED FOR  ANXIETY OR SLEEP, Disp: 45 tablet, Rfl: 5 .  amitriptyline (ELAVIL) 25 MG tablet, TAKE 3 TABLETS (75 MG TOTAL) BY MOUTH AT BEDTIME FOR NEUROPATHY, Disp: 270 tablet, Rfl: 1 .  atorvastatin (LIPITOR) 20 MG tablet, TAKE 1 TABLET BY MOUTH DAILY., Disp: 90 tablet, Rfl: 3 .  buPROPion (WELLBUTRIN SR) 150 MG 12 hr tablet, Take 1 tablet (150 mg total) by mouth daily., Disp: 30 tablet, Rfl: 0 .  furosemide (LASIX) 20 MG tablet, TAKE 1 TABLET BY MOUTH ONCE DAILY AS NEEDED FOR FLUID RETENTION, Disp: 30 tablet, Rfl: 3 .  losartan-hydrochlorothiazide (HYZAAR) 50-12.5 MG tablet, Take 1 tablet by mouth at bedtime., Disp: 90 tablet, Rfl: 3 .  metFORMIN (GLUCOPHAGE) 500 MG tablet, Take 1 tablet (500 mg total) by mouth daily with breakfast., Disp: 30 tablet, Rfl: 0 .  pantoprazole (PROTONIX) 40 MG tablet, Take 40 mg by mouth daily. , Disp: , Rfl:  .  polyethylene glycol powder (GLYCOLAX/MIRALAX) 17 GM/SCOOP powder, Take 17 g by mouth daily for 198 doses., Disp: 3350 g, Rfl: 0 .  Vitamin D, Ergocalciferol, (DRISDOL) 1.25 MG (50000 UT) CAPS capsule, Take 1 capsule (50,000 Units total) by mouth every 7 (seven) days., Disp: 4 capsule, Rfl: 0  EXAM:  VITALS per patient if applicable:  GENERAL: alert, oriented, appears sick,  But in no acute distress  HEENT: atraumatic, conjunttiva clear, no obvious abnormalities on inspection of external nose and ears  NECK: normal movements of the head and neck  LUNGS: on inspection no signs of respiratory distress, breathing rate appears normal, no obvious gross SOB, gasping or wheezing  CV: no obvious cyanosis  MS: moves all visible extremities without noticeable abnormality  PSYCH/NEURO: pleasant and cooperative, no obvious depression or anxiety, speech and thought processing grossly intact  ASSESSMENT AND PLAN:  Discussed the following assessment and plan:  Close Exposure to Covid-19 Virus  Close Exposure to Covid-19 Virus Will treat as COVID despite negative swab.   She has been isolating at home.  rok note written     I discussed the assessment and treatment plan with the patient. The patient was provided an opportunity to ask questions and all were answered. The patient agreed with the plan and demonstrated an understanding of the instructions.   The patient was advised to call back or seek an in-person evaluation if the symptoms worsen or if the condition fails to improve as anticipated.  I provided 25 minutes of non-face-to-face time during this encounter.   Crecencio Mc, MD

## 2019-05-20 NOTE — Assessment & Plan Note (Signed)
Will treat as COVID despite negative swab.  She has been isolating at home.  rok note written

## 2019-05-22 ENCOUNTER — Ambulatory Visit (INDEPENDENT_AMBULATORY_CARE_PROVIDER_SITE_OTHER): Payer: 59 | Admitting: Family Medicine

## 2019-06-06 ENCOUNTER — Encounter (INDEPENDENT_AMBULATORY_CARE_PROVIDER_SITE_OTHER): Payer: Self-pay | Admitting: Family Medicine

## 2019-06-06 ENCOUNTER — Telehealth (INDEPENDENT_AMBULATORY_CARE_PROVIDER_SITE_OTHER): Payer: 59 | Admitting: Family Medicine

## 2019-06-06 ENCOUNTER — Other Ambulatory Visit: Payer: Self-pay

## 2019-06-06 DIAGNOSIS — F3289 Other specified depressive episodes: Secondary | ICD-10-CM

## 2019-06-06 DIAGNOSIS — Z6836 Body mass index (BMI) 36.0-36.9, adult: Secondary | ICD-10-CM | POA: Diagnosis not present

## 2019-06-06 DIAGNOSIS — E1140 Type 2 diabetes mellitus with diabetic neuropathy, unspecified: Secondary | ICD-10-CM

## 2019-06-06 DIAGNOSIS — E559 Vitamin D deficiency, unspecified: Secondary | ICD-10-CM | POA: Diagnosis not present

## 2019-06-06 MED ORDER — BUPROPION HCL ER (SR) 150 MG PO TB12: 150.0000 mg | ORAL_TABLET | Freq: Every day | ORAL | 0 refills | Status: DC

## 2019-06-06 MED ORDER — METFORMIN HCL 500 MG PO TABS: 500.0000 mg | ORAL_TABLET | Freq: Every day | ORAL | 0 refills | Status: DC

## 2019-06-06 MED ORDER — VITAMIN D (ERGOCALCIFEROL) 1.25 MG (50000 UNIT) PO CAPS: 50000.0000 [IU] | ORAL_CAPSULE | ORAL | 0 refills | Status: DC

## 2019-06-07 NOTE — Progress Notes (Signed)
Office: [phone]  /  Fax: [phone] TeleHealth Visit:  Sheri [name] has verbally consented to this TeleHealth visit today. The patient is located at home, the provider is located at the [employer] [facility] office. The participants in this visit include the listed provider and patient. Sheri [name] was unable to use realtime audiovisual technology today and the telehealth visit was conducted via telephone.   HPI:   Chief Complaint: OBESITY Sheri [name] is here to discuss her progress with her obesity treatment plan. She is on the  follow the Category 3 plan and is following her eating plan approximately 50 % of the time. She states she is exercising 0 minutes 0 times per week. Sheri [name] has had a very bad last 3 weeks with likely COVID-19 infection and multiple sick family members. She is feeling better now except for her fatigue and "foggy" feeling but is ready to start back on her eating plan.   We were unable to weigh the patient today for this TeleHealth visit. She feels as if she has gained weight since her last visit. She has lost 19 lbs since starting treatment with [name].  Diabetes II Sheri [name] has a diagnosis of diabetes type II. Theona states she is not checking BG at home, she has been sick lately but has started back now and denies any hypoglycemic episodes, nausea, and vomiting. Last A1c was 6.2.  She has been working on intensive lifestyle modifications including diet, exercise, and weight loss to help control her blood glucose levels.  Vitamin D deficiency Sheri [name] has a diagnosis of vitamin D deficiency. She is currently taking vit D and denies nausea, vomiting or muscle weakness. Last vitamin D level not yet at goal.   Depression with emotional eating behaviors Sheri [name] is struggling with emotional eating and using food for comfort to the extent that it is negatively impacting her health. She often snacks when she is not hungry. Sheri [name] sometimes feels she is out of control and then feels guilty that she made poor  food choices. She has been working on behavior modification techniques to help reduce her emotional eating and has been somewhat successful. She shows no sign of suicidal or homicidal ideations. She has had multiple significant health stressors for her and her family in the last 2-3 weeks. She has not concentrated on her eating but has had minimal comfort eating.   Depression screen Sheri [name] 2/9 10/17/2018 09/26/2018 08/29/2018 08/03/2018 06/29/2018  Decreased Interest 0 0 0 0 0  Down, Depressed, Hopeless 0 0 0 0 0  PHQ - 2 Score 0 0 0 0 0  Altered sleeping 0 0 0 0 1  Tired, decreased energy 0 0 0 0 0  Change in appetite 0 0 0 0 0  Feeling bad or failure about yourself  0 0 0 0 0  Trouble concentrating 0 0 0 0 0  Moving slowly or fidgety/restless 0 0 0 0 0  Suicidal thoughts 0 0 0 0 0  PHQ-9 Score 0 0 0 0 1  Difficult doing work/chores - - - - -     ASSESSMENT AND PLAN:  Other depression - Plan: buPROPion (WELLBUTRIN SR) 150 MG 12 hr tablet  PLAN:  Diabetes II Sheri [name] has been given extensive diabetes education by myself today including ideal fasting and post-prandial blood glucose readings, individual ideal HgA1c goals  and hypoglycemia prevention. We discussed the importance of good blood sugar control to decrease the likelihood of diabetic complications such as nephropathy, neuropathy, limb loss, blindness,  coronary artery disease, and death. We discussed the importance of intensive lifestyle modification including diet, exercise and weight loss as the first line treatment for diabetes. Sheri [name] agrees to continue her diabetes medication Metformin 500 mg daily #30 with no refills and will follow up at the agreed upon time.  Vitamin D Deficiency Sheri [name] was informed that low vitamin D levels contributes to fatigue and are associated with obesity, breast, and colon cancer. She agrees to continue to take prescription Vit D @50 ,000 IU every week #4 with no refills and will follow up for routine testing of vitamin  D, at least 2-3 times per year. She was informed of the risk of over-replacement of vitamin D and agrees to not increase her dose unless she discusses this with [name] first. Agrees to follow up with our clinic as directed.   Depression with Emotional Eating Behaviors We discussed behavior modification techniques today to help Sheri [name] deal with her emotional eating and depression. She has agreed to continue to take Wellbutrin SR 150 mg qd #30 with no refills and agreed to follow up as directed.  Obesity Sheri [name] is currently in the action stage of change. As such, her goal is to continue with weight loss efforts She has agreed to follow the Category 3 plan Sheri [name] has been instructed to work up to a goal of 150 minutes of combined cardio and strengthening exercise per week for weight loss and overall health benefits. We discussed the following Behavioral Modification Strategies today: emotional eating strategies and increase water intake.    Sheri [name] has agreed to follow up with our clinic in 2-3 weeks. She was informed of the importance of frequent follow up visits to maximize her success with intensive lifestyle modifications for her multiple health conditions.  ALLERGIES: No Known Allergies  MEDICATIONS: Current Outpatient Medications on File Prior to Visit  Medication Sig Dispense Refill   ALPRAZolam (XANAX) 0.5 MG tablet TAKE 1 TABLET BY MOUTH TWICE DAILY AS NEEDED FOR ANXIETY OR SLEEP 45 tablet 5   amitriptyline (ELAVIL) 25 MG tablet TAKE 3 TABLETS (75 MG TOTAL) BY MOUTH AT BEDTIME FOR NEUROPATHY 270 tablet 1   atorvastatin (LIPITOR) 20 MG tablet TAKE 1 TABLET BY MOUTH DAILY. 90 tablet 3   furosemide (LASIX) 20 MG tablet TAKE 1 TABLET BY MOUTH ONCE DAILY AS NEEDED FOR FLUID RETENTION 30 tablet 3   losartan-hydrochlorothiazide (HYZAAR) 50-12.5 MG tablet Take 1 tablet by mouth at bedtime. 90 tablet 3   pantoprazole (PROTONIX) 40 MG tablet Take 40 mg by mouth daily.      polyethylene glycol powder  (GLYCOLAX/MIRALAX) 17 GM/SCOOP powder Take 17 g by mouth daily for 198 doses. 3350 g 0   No current facility-administered medications on file prior to visit.     PAST MEDICAL HISTORY: Past Medical History:  Diagnosis Date   Abdominal pain, epigastric    Anxiety    Back pain    Diabetes mellitus, type 2 ([place])    Diverticulosis of colon without hemorrhage    Duodenitis with hemorrhage    Edema    Fluid retention    GERD (gastroesophageal reflux disease)    Heart murmur    Hyperlipidemia    Hypertension    Leg edema    Mitral regurgitation    Obesity, unspecified    Osteopenia    Panic disorder without agoraphobia    Peripheral neuropathy    Personal history of malignant neoplasm of cervix uteri    CIN-3 1989, normal since, gets pap smears  annually   Personal history of other genital system and obstetric disorders(V13.29)    Prediabetes    Rash and other nonspecific skin eruption    Reflux esophagitis    Swallowing difficulty    Unspecified hereditary and idiopathic peripheral neuropathy    Unspecified sleep apnea    sleep study- [facility]   Vitamin D deficiency     PAST SURGICAL HISTORY: Past Surgical History:  Procedure Laterality Date   ABDOMINAL HYSTERECTOMY     BREAST CYST ASPIRATION     right , benign   COLONOSCOPY WITH PROPOFOL N/A 06/09/2018   Procedure: COLONOSCOPY WITH PROPOFOL;  Surgeon: Lollie Sails, MD;  Location: [facility] ENDOSCOPY;  Service: Endoscopy;  Laterality: N/A;   DILATION AND CURETTAGE OF UTERUS     ESOPHAGOGASTRODUODENOSCOPY N/A 06/09/2018   Procedure: ESOPHAGOGASTRODUODENOSCOPY (EGD);  Surgeon: Lollie Sails, MD;  Location: [name] ENDOSCOPY;  Service: Endoscopy;  Laterality: N/A;   RIGHT OOPHORECTOMY  2001   menopause, endometriosis s/p.  HRT  since- has not tolerated previous attempts to wean    SOCIAL HISTORY: Social History   Tobacco Use   Smoking status: Never Smoker   Smokeless tobacco: Never Used    Substance Use Topics   Alcohol use: Yes    Alcohol/week: 0.0 standard drinks    Comment: rare   Drug use: No    FAMILY HISTORY: Family History  Problem Relation Age of Onset   Osteopenia Mother    Hypothyroidism Mother    Diabetes Mother    Hyperlipidemia Mother    Hypertension Mother    Obesity Mother    Diabetes Father    Alzheimer's disease Father    Coronary artery disease Father    Obesity Father    Cancer Neg Hx        breast, ovarian, colon   Breast cancer Neg Hx     ROS: Review of Systems  Gastrointestinal: Negative for nausea and vomiting.  Musculoskeletal:       Negative for muscle weakness  Endo/Heme/Allergies:       Negative for hypoglycemia   Psychiatric/Behavioral: Positive for depression. Negative for suicidal ideas.    PHYSICAL EXAM: Pt in no acute distress  RECENT LABS AND TESTS: BMET    Component Value Date/Time   NA 138 03/02/2019 1045   NA 138 08/29/2018 0916   NA 139 04/18/2012 0931   K 4.3 03/02/2019 1045   K 4.1 04/18/2012 0931   CL 105 03/02/2019 1045   CL 106 04/18/2012 0931   CO2 23 03/02/2019 1045   CO2 23 04/18/2012 0931   GLUCOSE 101 (H) 03/02/2019 1045   GLUCOSE 105 (H) 04/18/2012 0931   BUN 16 03/02/2019 1045   BUN 18 08/29/2018 0916   BUN 13 04/18/2012 0931   CREATININE 0.64 03/02/2019 1045   CREATININE 0.63 04/18/2012 0931   CALCIUM 9.3 03/02/2019 1045   CALCIUM 8.9 04/18/2012 0931   GFRNONAA 93 08/29/2018 0916   GFRNONAA >60 04/18/2012 0931   GFRAA 107 08/29/2018 0916   GFRAA >60 04/18/2012 0931   Lab Results  Component Value Date   HGBA1C 6.2 03/02/2019   HGBA1C 5.8 (H) 08/29/2018   HGBA1C 6.4 (H) 05/30/2018   HGBA1C 6.6 (H) 02/15/2018   HGBA1C 6.0 08/10/2017   Lab Results  Component Value Date   INSULIN 11.8 08/29/2018   INSULIN 20.8 05/30/2018   CBC    Component Value Date/Time   WBC 6.6 05/30/2018 1248   WBC 5.1 05/11/2013 0928   RBC 5.00  05/30/2018 1248   RBC 4.62 05/11/2013 0928    HGB 14.4 05/30/2018 1248   HCT 43.8 05/30/2018 1248   PLT 301.0 05/11/2013 0928   MCV 88 05/30/2018 1248   MCH 28.8 05/30/2018 1248   MCHC 32.9 05/30/2018 1248   MCHC 34.3 05/11/2013 0928   RDW 11.9 (L) 05/30/2018 1248   LYMPHSABS 2.2 05/30/2018 1248   MONOABS 0.5 05/11/2013 0928   EOSABS 0.1 05/30/2018 1248   BASOSABS 0.0 05/30/2018 1248   Iron/TIBC/Ferritin/ %Sat No results found for: IRON, TIBC, FERRITIN, IRONPCTSAT Lipid Panel     Component Value Date/Time   CHOL 154 03/02/2019 1045   CHOL 113 08/29/2018 0916   TRIG 110.0 03/02/2019 1045   HDL 51.80 03/02/2019 1045   HDL 36 (L) 08/29/2018 0916   CHOLHDL 3 03/02/2019 1045   VLDL 22.0 03/02/2019 1045   LDLCALC 80 03/02/2019 1045   LDLCALC 57 08/29/2018 0916   LDLDIRECT 97.0 08/11/2016 0853   Hepatic Function Panel     Component Value Date/Time   PROT 6.6 03/02/2019 1045   PROT 6.9 08/29/2018 0916   PROT 7.5 04/18/2012 0931   ALBUMIN 4.3 03/02/2019 1045   ALBUMIN 4.6 08/29/2018 0916   ALBUMIN 4.0 04/18/2012 0931   AST 15 03/02/2019 1045   AST 26 04/18/2012 0931   ALT 19 03/02/2019 1045   ALT 26 04/18/2012 0931   ALKPHOS 63 03/02/2019 1045   ALKPHOS 55 04/18/2012 0931   BILITOT 0.8 03/02/2019 1045   BILITOT 0.8 08/29/2018 0916   BILITOT 0.8 04/18/2012 0931      Component Value Date/Time   TSH 2.940 05/30/2018 1248   TSH 1.02 05/11/2013 0928     Ref. Range 08/29/2018 09:16  Vitamin D, 25-Hydroxy Latest Ref Range: 30.0 - 100.0 ng/mL 63.9     I, Renee Ramus, am acting as Location manager for Dennard Nip, MD  I have reviewed the above documentation for accuracy and completeness, and I agree with the above. -Dennard Nip, MD

## 2019-06-11 ENCOUNTER — Other Ambulatory Visit: Payer: Self-pay | Admitting: Internal Medicine

## 2019-06-11 DIAGNOSIS — Z1231 Encounter for screening mammogram for malignant neoplasm of breast: Secondary | ICD-10-CM

## 2019-06-18 ENCOUNTER — Other Ambulatory Visit: Payer: Self-pay | Admitting: Internal Medicine

## 2019-06-25 ENCOUNTER — Other Ambulatory Visit: Payer: Self-pay

## 2019-06-25 ENCOUNTER — Ambulatory Visit (INDEPENDENT_AMBULATORY_CARE_PROVIDER_SITE_OTHER): Payer: 59 | Admitting: Family Medicine

## 2019-06-25 VITALS — BP 120/82 | HR 98 | Temp 98.0°F | Ht 63.0 in | Wt 220.0 lb

## 2019-06-25 DIAGNOSIS — E1140 Type 2 diabetes mellitus with diabetic neuropathy, unspecified: Secondary | ICD-10-CM | POA: Diagnosis not present

## 2019-06-25 DIAGNOSIS — E559 Vitamin D deficiency, unspecified: Secondary | ICD-10-CM

## 2019-06-25 DIAGNOSIS — Z6839 Body mass index (BMI) 39.0-39.9, adult: Secondary | ICD-10-CM

## 2019-06-25 DIAGNOSIS — F3289 Other specified depressive episodes: Secondary | ICD-10-CM

## 2019-06-25 DIAGNOSIS — R5383 Other fatigue: Secondary | ICD-10-CM

## 2019-06-25 DIAGNOSIS — Z9189 Other specified personal risk factors, not elsewhere classified: Secondary | ICD-10-CM

## 2019-06-25 MED ORDER — METFORMIN HCL 500 MG PO TABS: 500.0000 mg | ORAL_TABLET | Freq: Every day | ORAL | 0 refills | Status: DC

## 2019-06-25 MED ORDER — VITAMIN D (ERGOCALCIFEROL) 1.25 MG (50000 UNIT) PO CAPS: 50000.0000 [IU] | ORAL_CAPSULE | ORAL | 0 refills | Status: DC

## 2019-06-25 MED ORDER — BUPROPION HCL ER (SR) 150 MG PO TB12: 150.0000 mg | ORAL_TABLET | Freq: Every day | ORAL | 0 refills | Status: DC

## 2019-06-26 LAB — COMPREHENSIVE METABOLIC PANEL
ALT: 22 IU/L (ref 0–32)
AST: 17 IU/L (ref 0–40)
Albumin/Globulin Ratio: 2.1 (ref 1.2–2.2)
Albumin: 4.2 g/dL (ref 3.8–4.8)
Alkaline Phosphatase: 78 IU/L (ref 39–117)
BUN/Creatinine Ratio: 29 — ABNORMAL HIGH (ref 12–28)
BUN: 20 mg/dL (ref 8–27)
Bilirubin Total: 0.5 mg/dL (ref 0.0–1.2)
CO2: 23 mmol/L (ref 20–29)
Calcium: 9.1 mg/dL (ref 8.7–10.3)
Chloride: 106 mmol/L (ref 96–106)
Creatinine, Ser: 0.68 mg/dL (ref 0.57–1.00)
GFR calc Af Amer: 108 mL/min/{1.73_m2} (ref >59)
GFR calc non Af Amer: 93 mL/min/{1.73_m2} (ref >59)
Globulin, Total: 2 g/dL (ref 1.5–4.5)
Glucose: 100 mg/dL — ABNORMAL HIGH (ref 65–99)
Potassium: 4.2 mmol/L (ref 3.5–5.2)
Sodium: 141 mmol/L (ref 134–144)
Total Protein: 6.2 g/dL (ref 6.0–8.5)

## 2019-06-26 LAB — VITAMIN D 25 HYDROXY (VIT D DEFICIENCY, FRACTURES): Vit D, 25-Hydroxy: 35.1 ng/mL (ref 30.0–100.0)

## 2019-06-26 LAB — T4, FREE: Free T4: 1.05 ng/dL (ref 0.82–1.77)

## 2019-06-26 LAB — INSULIN, RANDOM: INSULIN: 16.9 u[IU]/mL (ref 2.6–24.9)

## 2019-06-26 LAB — HEMOGLOBIN A1C
Est. average glucose Bld gHb Est-mCnc: 126 mg/dL
Hgb A1c MFr Bld: 6 % — ABNORMAL HIGH (ref 4.8–5.6)

## 2019-06-26 LAB — T3: T3, Total: 97 ng/dL (ref 71–180)

## 2019-06-26 LAB — TSH: TSH: 2.44 u[IU]/mL (ref 0.450–4.500)

## 2019-06-26 NOTE — Progress Notes (Signed)
Office: [phone]  /  Fax: [phone]   HPI:   Chief Complaint: OBESITY Sheri [name] is here to discuss her progress with her obesity treatment plan. She is on the Category 3 plan and is following her eating plan approximately 50 % of the time. She states she is exercising 0 minutes 0 times per week. Sheri [name] had increased stress due to taking care of her mom at home and she is feeling isolated at work. Sheri [name] still feels tired. She was ill recently and was told she likely had COVID. She snacks a lot on simple carbs at work. She packs her lunch but then she goes to the cafeteria and buys food instead Her weight is 220 lb (99.8 kg) today and has had a weight gain of 12 pounds since her last in-office visit. She has lost 7 lbs since starting treatment with [name].  Sheri [name] is not feeling well, due to caring for her mom and husband.   Diabetes II with neuropathy Sheri [name] has a diagnosis of diabetes type II and she is well controlled. Sheri [name] does not check her blood sugars at home and she denies hypoglycemia. She has been working on intensive lifestyle modifications including diet, exercise, and weight loss to help control her blood glucose levels. Sheri [name] admits to carb cravings.  Vitamin D deficiency Sheri [name] has a diagnosis of vitamin D deficiency. She is currently taking vit D. Her last vitamin D level was at 44 on 03/02/19. She admits Sheri and denies nausea, vomiting or muscle weakness.  At risk for osteopenia and osteoporosis Sheri [name] is at higher risk of osteopenia and osteoporosis due to vitamin D deficiency.   Depression with emotional eating behaviors Sheri [name] has increased stress. She has been off Bupropion for a few weeks. She feels isolated at work, because everyone is working at home. She has seen Sheri [name] in the past. Sheri [name] struggles with emotional eating and using food for comfort to the extent that it is negatively impacting her health. She often snacks when she is not hungry. Sheri [name] sometimes feels she is out of control and  then feels guilty that she made poor food choices. She has been working on behavior modification techniques to help reduce her emotional eating and has been somewhat successful. She shows no sign of suicidal or homicidal ideations.  ASSESSMENT AND PLAN:  Type 2 diabetes mellitus with diabetic neuropathy, without long-term current use of insulin ([place]) - Plan: metFORMIN (GLUCOPHAGE) 500 MG tablet, Comprehensive metabolic panel, Hemoglobin A1c, Insulin, random  Vitamin D deficiency - Plan: Vitamin D, Ergocalciferol, (DRISDOL) 1.25 MG (50000 UT) CAPS capsule, VITAMIN D 25 Hydroxy (Vit-D Deficiency, Fractures)  Other Sheri - Plan: T3, T4, free, TSH  Other depression - with emotional eating  - Plan: buPROPion (WELLBUTRIN SR) 150 MG 12 hr tablet  At risk for osteoporosis  Class 2 severe obesity due to excess calories with serious comorbidity and body mass index (BMI) of 39.0 to 39.9 in adult [facility])  PLAN:  Sheri Sheri [name] was informed that her Sheri may be related to obesity, depression or many other causes. We will check thyroid panel and in the meanwhile Sheri [name] has agreed to continue working on diet, exercise and weight loss to help with Sheri.   Diabetes II with neuropathy Sheri [name] has been given extensive diabetes education by myself today including ideal fasting and post-prandial blood glucose readings, individual ideal Hgb A1c goals and hypoglycemia prevention. We discussed the importance of good blood sugar control to decrease the likelihood of diabetic complications  such as nephropathy, neuropathy, limb loss, blindness, coronary artery disease, and death. We discussed the importance of intensive lifestyle modification including diet, exercise and weight loss as the first line treatment for diabetes. We will check A1c, insulin level, and CMET today. Sheri [name] agrees to continue metformin 500 mg qAM #30 with no refills and follow up at the agreed upon time.  Vitamin D Deficiency Sheri [name] was informed that low  vitamin D levels contributes to Sheri and are associated with obesity, breast, and colon cancer. Sheri [name] agrees to continue to take prescription Vit D @50 ,000 IU every week #5 with no refills and she will follow up for routine testing of vitamin D, at least 2-3 times per year. She was informed of the risk of over-replacement of vitamin D and agrees to not increase her dose unless she discusses this with [name] first. We will check vitamin D level today and Sheri [name] agreed to follow up as directed.  At risk for osteopenia and osteoporosis Sheri [name] was given extended  (15 minutes) osteoporosis prevention counseling today. Sheri [name] is at risk for osteopenia and osteoporosis due to her vitamin D deficiency. She was encouraged to take her vitamin D and follow her higher calcium diet and increase strengthening exercise to help strengthen her bones and decrease her risk of osteopenia and osteoporosis.  Depression with Emotional Eating Behaviors We discussed behavior modification techniques today to help Sheri [name] deal with her emotional eating and depression. She has continue to take Bupropion SR 150 mg qAM #30 with no refills and call EAP (employee assistance program) for counseling. Sheri [name] agrees to follow up as directed.  Obesity Sheri [name] is currently in the action stage of change. As such, her goal is to continue with weight loss efforts She has agreed to follow the Category 3 plan Sheri [name] will walk at lunchtime for weight loss and overall health benefits. We discussed the following Behavioral Modification Strategies today: planning for success, better snacking choices, increasing lean protein intake, decreasing simple carbohydrates  and avoiding temptations  Sheri [name] will remove junk food from her office and she will take healthy snacks to work instead.  Farin has agreed to follow up with our clinic in 2 weeks. She was informed of the importance of frequent follow up visits to maximize her success with intensive lifestyle modifications for her multiple  health conditions.  ALLERGIES: No Known Allergies  MEDICATIONS: Current Outpatient Medications on File Prior to Visit  Medication Sig Dispense Refill  . ALPRAZolam (XANAX) 0.5 MG tablet TAKE 1 TABLET BY MOUTH TWICE DAILY AS NEEDED FOR ANXIETY OR SLEEP 45 tablet 5  . amitriptyline (ELAVIL) 25 MG tablet TAKE 3 TABLETS (75 MG TOTAL) BY MOUTH AT BEDTIME FOR NEUROPATHY 270 tablet 1  . atorvastatin (LIPITOR) 20 MG tablet TAKE 1 TABLET BY MOUTH DAILY. 90 tablet 3  . furosemide (LASIX) 20 MG tablet TAKE 1 TABLET BY MOUTH ONCE DAILY AS NEEDED FOR FLUID RETENTION 30 tablet 3  . losartan-hydrochlorothiazide (HYZAAR) 50-12.5 MG tablet Take 1 tablet by mouth at bedtime. 90 tablet 3  . pantoprazole (PROTONIX) 40 MG tablet Take 40 mg by mouth daily.     . polyethylene glycol powder (GLYCOLAX/MIRALAX) 17 GM/SCOOP powder Take 17 g by mouth daily for 198 doses. 3350 g 0   No current facility-administered medications on file prior to visit.     PAST MEDICAL HISTORY: Past Medical History:  Diagnosis Date  . Abdominal pain, epigastric   . Anxiety   . Back pain   . Diabetes mellitus, type  2 ([place])   . Diverticulosis of colon without hemorrhage   . Duodenitis with hemorrhage   . Edema   . Fluid retention   . GERD (gastroesophageal reflux disease)   . Heart murmur   . Hyperlipidemia   . Hypertension   . Leg edema   . Mitral regurgitation   . Obesity, unspecified   . Osteopenia   . Panic disorder without agoraphobia   . Peripheral neuropathy   . Personal history of malignant neoplasm of cervix uteri    CIN-3 1989, normal since, gets pap smears annually  . Personal history of other genital system and obstetric disorders(V13.29)   . Prediabetes   . Rash and other nonspecific skin eruption   . Reflux esophagitis   . Swallowing difficulty   . Unspecified hereditary and idiopathic peripheral neuropathy   . Unspecified sleep apnea    sleep study- [facility]  . Vitamin D deficiency     PAST SURGICAL  HISTORY: Past Surgical History:  Procedure Laterality Date  . ABDOMINAL HYSTERECTOMY    . BREAST CYST ASPIRATION     right , benign  . COLONOSCOPY WITH PROPOFOL N/A 06/09/2018   Procedure: COLONOSCOPY WITH PROPOFOL;  Surgeon: Lollie Sails, MD;  Location: [facility] ENDOSCOPY;  Service: Endoscopy;  Laterality: N/A;  . DILATION AND CURETTAGE OF UTERUS    . ESOPHAGOGASTRODUODENOSCOPY N/A 06/09/2018   Procedure: ESOPHAGOGASTRODUODENOSCOPY (EGD);  Surgeon: Lollie Sails, MD;  Location: [facility] ENDOSCOPY;  Service: Endoscopy;  Laterality: N/A;  . RIGHT OOPHORECTOMY  2001   menopause, endometriosis s/p.  HRT  since- has not tolerated previous attempts to wean    SOCIAL HISTORY: Social History   Tobacco Use  . Smoking status: Never Smoker  . Smokeless tobacco: Never Used  Substance Use Topics  . Alcohol use: Yes    Alcohol/week: 0.0 standard drinks    Comment: rare  . Drug use: No    FAMILY HISTORY: Family History  Problem Relation Age of Onset  . Osteopenia Mother   . Hypothyroidism Mother   . Diabetes Mother   . Hyperlipidemia Mother   . Hypertension Mother   . Obesity Mother   . Diabetes Father   . Alzheimer's disease Father   . Coronary artery disease Father   . Obesity Father   . Cancer Neg Hx        breast, ovarian, colon  . Breast cancer Neg Hx     ROS: Review of Systems  Constitutional: Positive for malaise/Sheri. Negative for weight loss.  Gastrointestinal: Negative for nausea and vomiting.  Musculoskeletal:       Negative for muscle weakness  Endo/Heme/Allergies:       Negative for hypoglycemia Positive for carb cravings  Psychiatric/Behavioral: Positive for depression. Negative for suicidal ideas.       Positive for Stress    PHYSICAL EXAM: Blood pressure 120/82, pulse 98, temperature 98 F (36.7 C), temperature source Oral, height 5\' 3"  (1.6 m), weight 220 lb (99.8 kg), SpO2 98 %. Body mass index is 38.97 kg/m. Physical Exam Vitals signs  reviewed.  Constitutional:      Appearance: Normal appearance. She is well-developed. She is obese.  Cardiovascular:     Rate and Rhythm: Normal rate.  Pulmonary:     Effort: Pulmonary effort is normal.  Musculoskeletal: Normal range of motion.  Skin:    General: Skin is warm and dry.  Neurological:     Mental Status: She is alert and oriented to person, place, and time.  Psychiatric:  Mood and Affect: Mood normal.        Behavior: Behavior normal.        Thought Content: Thought content does not include homicidal or suicidal ideation.     RECENT LABS AND TESTS: BMET    Component Value Date/Time   NA 141 06/25/2019 0835   NA 139 04/18/2012 0931   K 4.2 06/25/2019 0835   K 4.1 04/18/2012 0931   CL 106 06/25/2019 0835   CL 106 04/18/2012 0931   CO2 23 06/25/2019 0835   CO2 23 04/18/2012 0931   GLUCOSE 100 (H) 06/25/2019 0835   GLUCOSE 101 (H) 03/02/2019 1045   GLUCOSE 105 (H) 04/18/2012 0931   BUN 20 06/25/2019 0835   BUN 13 04/18/2012 0931   CREATININE 0.68 06/25/2019 0835   CREATININE 0.63 04/18/2012 0931   CALCIUM 9.1 06/25/2019 0835   CALCIUM 8.9 04/18/2012 0931   GFRNONAA 93 06/25/2019 0835   GFRNONAA >60 04/18/2012 0931   GFRAA 108 06/25/2019 0835   GFRAA >60 04/18/2012 0931   Lab Results  Component Value Date   HGBA1C 6.0 (H) 06/25/2019   HGBA1C 6.2 03/02/2019   HGBA1C 5.8 (H) 08/29/2018   HGBA1C 6.4 (H) 05/30/2018   HGBA1C 6.6 (H) 02/15/2018   Lab Results  Component Value Date   INSULIN 16.9 06/25/2019   INSULIN 11.8 08/29/2018   INSULIN 20.8 05/30/2018   CBC    Component Value Date/Time   WBC 6.6 05/30/2018 1248   WBC 5.1 05/11/2013 0928   RBC 5.00 05/30/2018 1248   RBC 4.62 05/11/2013 0928   HGB 14.4 05/30/2018 1248   HCT 43.8 05/30/2018 1248   PLT 301.0 05/11/2013 0928   MCV 88 05/30/2018 1248   MCH 28.8 05/30/2018 1248   MCHC 32.9 05/30/2018 1248   MCHC 34.3 05/11/2013 0928   RDW 11.9 (L) 05/30/2018 1248   LYMPHSABS 2.2  05/30/2018 1248   MONOABS 0.5 05/11/2013 0928   EOSABS 0.1 05/30/2018 1248   BASOSABS 0.0 05/30/2018 1248   Iron/TIBC/Ferritin/ %Sat No results found for: IRON, TIBC, FERRITIN, IRONPCTSAT Lipid Panel     Component Value Date/Time   CHOL 154 03/02/2019 1045   CHOL 113 08/29/2018 0916   TRIG 110.0 03/02/2019 1045   HDL 51.80 03/02/2019 1045   HDL 36 (L) 08/29/2018 0916   CHOLHDL 3 03/02/2019 1045   VLDL 22.0 03/02/2019 1045   LDLCALC 80 03/02/2019 1045   LDLCALC 57 08/29/2018 0916   LDLDIRECT 97.0 08/11/2016 0853   Hepatic Function Panel     Component Value Date/Time   PROT 6.2 06/25/2019 0835   PROT 7.5 04/18/2012 0931   ALBUMIN 4.2 06/25/2019 0835   ALBUMIN 4.0 04/18/2012 0931   AST 17 06/25/2019 0835   AST 26 04/18/2012 0931   ALT 22 06/25/2019 0835   ALT 26 04/18/2012 0931   ALKPHOS 78 06/25/2019 0835   ALKPHOS 55 04/18/2012 0931   BILITOT 0.5 06/25/2019 0835   BILITOT 0.8 04/18/2012 0931      Component Value Date/Time   TSH 2.440 06/25/2019 0835   TSH 2.940 05/30/2018 1248   TSH 1.02 05/11/2013 0928     Ref. Range 03/02/2019 10:45  VITD Latest Ref Range: 30.00 - 100.00 ng/mL 44.38    OBESITY BEHAVIORAL INTERVENTION VISIT  Today's visit was # 17   Starting weight: 227 lbs Starting date: 05/30/2018 Today's weight : 220 lbs Today's date: 06/25/2019 Total lbs lost to date: 7    06/25/2019  Height 5\' 3"  (1.6 m)  Weight 220 lb (99.8 kg)  BMI (Calculated) 38.98  BLOOD PRESSURE - SYSTOLIC [id number]  BLOOD PRESSURE - DIASTOLIC 82   Body Fat % 123XX123 %  Total Body Water (lbs) 83.6 lbs    ASK: We discussed the diagnosis of obesity with Aleen Sells [place] today and Clarene agreed to give [name] permission to discuss obesity behavioral modification therapy today.  ASSESS: Brystol has the diagnosis of obesity and her BMI today is 38.98 Lyne is in the action stage of change   ADVISE: Sariya was educated on the multiple health risks of obesity as well as the benefit of weight loss to  improve her health. She was advised of the need for long term treatment and the importance of lifestyle modifications to improve her current health and to decrease her risk of future health problems.  AGREE: Multiple dietary modification options and treatment options were discussed and  Crystale agreed to follow the recommendations documented in the above note.  ARRANGE: Mikailah was educated on the importance of frequent visits to treat obesity as outlined per CMS and USPSTF guidelines and agreed to schedule her next follow up appointment today.  I, Doreene Nest, am acting as transcriptionist for [employer], FNP-C  I have reviewed the above documentation for accuracy and completeness, and I agree with the above.  - Dugan Vanhoesen, FNP-C.

## 2019-06-27 ENCOUNTER — Encounter (INDEPENDENT_AMBULATORY_CARE_PROVIDER_SITE_OTHER): Payer: Self-pay | Admitting: Family Medicine

## 2019-06-27 DIAGNOSIS — F32A Depression, unspecified: Secondary | ICD-10-CM | POA: Insufficient documentation

## 2019-06-27 DIAGNOSIS — F329 Major depressive disorder, single episode, unspecified: Secondary | ICD-10-CM | POA: Insufficient documentation

## 2019-06-28 ENCOUNTER — Other Ambulatory Visit: Payer: Self-pay

## 2019-06-28 ENCOUNTER — Ambulatory Visit: Payer: 59 | Admitting: Internal Medicine

## 2019-06-28 ENCOUNTER — Encounter: Payer: Self-pay | Admitting: Internal Medicine

## 2019-06-28 DIAGNOSIS — F5104 Psychophysiologic insomnia: Secondary | ICD-10-CM

## 2019-06-28 DIAGNOSIS — E1140 Type 2 diabetes mellitus with diabetic neuropathy, unspecified: Secondary | ICD-10-CM

## 2019-06-28 DIAGNOSIS — F3289 Other specified depressive episodes: Secondary | ICD-10-CM | POA: Diagnosis not present

## 2019-06-28 DIAGNOSIS — I10 Essential (primary) hypertension: Secondary | ICD-10-CM | POA: Diagnosis not present

## 2019-06-28 MED ORDER — PANTOPRAZOLE SODIUM 40 MG PO TBEC: 40.0000 mg | DELAYED_RELEASE_TABLET | Freq: Every day | ORAL | 3 refills | Status: DC
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
## 2019-06-28 NOTE — Patient Instructions (Signed)
Increase the amitriptyline to 75 mg at bedtime for the neuropathy  Return in one month for your annual exam

## 2019-06-28 NOTE — Assessment & Plan Note (Signed)
Well controlled on current regimen. Renal function has been stable, no changes today.

## 2019-06-30 ENCOUNTER — Encounter: Payer: Self-pay | Admitting: Internal Medicine
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
## 2019-06-30 NOTE — Assessment & Plan Note (Addendum)
Currently well-controlled on metformin .  hemoglobin A1c is at goal of less than 7.0 . Patient is reminded to schedule an annual eye exam and foot exam is normal today. Patient has no microalbuminuria. Patient is tolerating statin therapy for CAD risk reduction and on ACE/ARB for renal protection and hypertension .  Her neuropathy is becoming more troublesome, advised to increase her dose of Elavil to 75 mg daily   Lab Results  Component Value Date   HGBA1C 6.0 (H) 06/25/2019

## 2019-07-12 DIAGNOSIS — K2270 Barrett's esophagus without dysplasia: Secondary | ICD-10-CM | POA: Diagnosis not present

## 2019-07-16 ENCOUNTER — Ambulatory Visit (INDEPENDENT_AMBULATORY_CARE_PROVIDER_SITE_OTHER): Payer: 59 | Admitting: Family Medicine

## 2019-07-17 ENCOUNTER — Ambulatory Visit
Admission: RE | Admit: 2019-07-17 | Discharge: 2019-07-17 | Disposition: A | Discharge status: Home / Self Care | Payer: 59 | Source: Ambulatory Visit | Attending: Internal Medicine | Admitting: Internal Medicine

## 2019-07-17 DIAGNOSIS — Z1231 Encounter for screening mammogram for malignant neoplasm of breast: Secondary | ICD-10-CM | POA: Diagnosis not present

## 2019-08-07 ENCOUNTER — Other Ambulatory Visit: Payer: Self-pay | Admitting: Internal Medicine

## 2019-08-07 NOTE — Telephone Encounter (Signed)
Refilled: 03/03/2019 Last OV: 06/28/2019 Next OV: 08/15/2019

## 2019-08-13 ENCOUNTER — Other Ambulatory Visit: Payer: Self-pay

## 2019-08-15 ENCOUNTER — Ambulatory Visit (INDEPENDENT_AMBULATORY_CARE_PROVIDER_SITE_OTHER): Payer: 59 | Admitting: Internal Medicine

## 2019-08-15 ENCOUNTER — Other Ambulatory Visit: Payer: Self-pay

## 2019-08-15 ENCOUNTER — Encounter: Payer: Self-pay | Admitting: Internal Medicine

## 2019-08-15 VITALS — BP 120/82 | HR 110 | Temp 97.4°F | Resp 15 | Ht 63.0 in | Wt 227.6 lb

## 2019-08-15 DIAGNOSIS — E1140 Type 2 diabetes mellitus with diabetic neuropathy, unspecified: Secondary | ICD-10-CM | POA: Diagnosis not present

## 2019-08-15 DIAGNOSIS — Z0000 Encounter for general adult medical examination without abnormal findings: Secondary | ICD-10-CM

## 2019-08-15 MED ORDER — METHOCARBAMOL 500 MG PO TABS: 500.0000 mg | ORAL_TABLET | Freq: Four times a day (QID) | ORAL | 3 refills | Status: DC

## 2019-08-15 NOTE — Patient Instructions (Signed)

## 2019-08-15 NOTE — Progress Notes (Signed)
Patient ID: Sheri [name], female    DOB: 04-23-1956  Age: 63 y.o. MRN: [id number]  The patient is here for annual preventive  examination and management of other chronic and acute problems.  This visit occurred during the SARS-CoV-2 public health emergency.  Safety protocols were in place, including screening questions prior to the visit, additional usage of staff PPE, and extensive cleaning of exam room while observing appropriate contact time as indicated for disinfecting solutions.    mammogram oct 27   Sept 2019 colonoscopy   The risk factors are reflected in the social history.  The roster of all physicians providing medical care to patient - is listed in the Snapshot section of the chart.  Activities of daily living:  The patient is 100% independent in all ADLs: dressing, toileting, feeding as well as independent mobility  Home safety : The patient has smoke detectors in the home. They wear seatbelts.  There are no firearms at home. There is no violence in the home.   There is no risks for hepatitis, STDs or HIV. There is no   history of blood transfusion. They have no travel history to infectious disease endemic areas of the world.  The patient has seen their dentist in the last six month. They have seen their eye doctor in the last year. .  They do not  have excessive sun exposure. Discussed the need for sun protection: hats, long sleeves and use of sunscreen if there is significant sun exposure.   Diet: the importance of a healthy diet is discussed. They do have a healthy diet.  The benefits of regular aerobic exercise were discussed. She has not been exercising or walking regularly.   Depression screen: there are no signs or vegative symptoms of depression- irritability, change in appetite, anhedonia, sadness/tearfullness.   The following portions of the patient's history were reviewed and updated as appropriate: allergies, current medications, past family history, past  medical history,  past surgical history, past social history  and problem list.  Visual acuity was not assessed per patient preference since she has regular follow up with her ophthalmologist. Hearing and body mass index were assessed and reviewed.   During the course of the visit the patient was educated and counseled about appropriate screening and preventive services including : fall prevention , diabetes screening, nutrition counseling, colorectal cancer screening, and recommended immunizations.    CC: There were no encounter diagnoses.  Stress.  Mother fell at home and broke several toes,  Had to use a walker,  Then [name] was admitted to hospital  with CHF   Left sided headache  Location  frontal. Has been recurrent not as severe as the one a few weeks ago. jaw feels tense   History [place] has a past medical history of Abdominal pain, epigastric, Anxiety, Back pain, Diabetes mellitus, type 2 (HCC), Diverticulosis of colon without hemorrhage, Duodenitis with hemorrhage, Edema, Fluid retention, GERD (gastroesophageal reflux disease), Heart murmur, Hyperlipidemia, Hypertension, Leg edema, Mitral regurgitation, Obesity, unspecified, Osteopenia, Panic disorder without agoraphobia, Peripheral neuropathy, Personal history of malignant neoplasm of cervix uteri, Personal history of other genital system and obstetric disorders(V13.29), Prediabetes, Rash and other nonspecific skin eruption, Reflux esophagitis, Swallowing difficulty, Unspecified hereditary and idiopathic peripheral neuropathy, Unspecified sleep apnea, and Vitamin D deficiency.   She has a past surgical history that includes Right oophorectomy (2001); Breast cyst aspiration; Dilation and curettage of uterus; Abdominal hysterectomy; Esophagogastroduodenoscopy (N/A, 06/09/2018); and Colonoscopy with propofol (N/A, 06/09/2018).   Her family history  includes Alzheimer's disease in her father; Coronary artery disease in her father; Diabetes in her  father and mother; Hyperlipidemia in her mother; Hypertension in her mother; Hypothyroidism in her mother; Obesity in her father and mother; Osteopenia in her mother.She reports that she has never smoked. She has never used smokeless tobacco. She reports current alcohol use. She reports that she does not use drugs.  Outpatient Medications Prior to Visit  Medication Sig Dispense Refill  . ALPRAZolam (XANAX) 0.5 MG tablet TAKE 1 TABLET BY MOUTH TWICE DAILY AS NEEDED FOR ANXIETY OR SLEEP 45 tablet 5  . amitriptyline (ELAVIL) 25 MG tablet TAKE 3 TABLETS (75 MG TOTAL) BY MOUTH AT BEDTIME FOR NEUROPATHY 270 tablet 1  . atorvastatin (LIPITOR) 20 MG tablet TAKE 1 TABLET BY MOUTH DAILY. 90 tablet 3  . furosemide (LASIX) 20 MG tablet TAKE 1 TABLET BY MOUTH ONCE DAILY AS NEEDED FOR FLUID RETENTION 30 tablet 3  . losartan-hydrochlorothiazide (HYZAAR) 50-12.5 MG tablet Take 1 tablet by mouth at bedtime. 90 tablet 3  . pantoprazole (PROTONIX) 40 MG tablet Take 1 tablet (40 mg total) by mouth daily. 90 tablet 3  . polyethylene glycol powder (GLYCOLAX/MIRALAX) 17 GM/SCOOP powder Take 17 g by mouth daily for 198 doses. 3350 g 0  . buPROPion (WELLBUTRIN SR) 150 MG 12 hr tablet Take 1 tablet (150 mg total) by mouth daily. (Patient not taking: Reported on 08/15/2019) 30 tablet 0  . metFORMIN (GLUCOPHAGE) 500 MG tablet Take 1 tablet (500 mg total) by mouth daily with breakfast. (Patient not taking: Reported on 08/15/2019) 30 tablet 0  . Vitamin D, Ergocalciferol, (DRISDOL) 1.25 MG (50000 UT) CAPS capsule Take 1 capsule (50,000 Units total) by mouth every 7 (seven) days. (Patient not taking: Reported on 08/15/2019) 5 capsule 0   No facility-administered medications prior to visit.     Review of Systems   Patient denies headache, fevers, malaise, unintentional weight loss, skin rash, eye pain, sinus congestion and sinus pain, sore throat, dysphagia,  hemoptysis , cough, dyspnea, wheezing, chest pain, palpitations,  orthopnea, edema, abdominal pain, nausea, melena, diarrhea, constipation, flank pain, dysuria, hematuria, urinary  Frequency, nocturia, numbness, tingling, seizures,  Focal weakness, Loss of consciousness,  Tremor, insomnia, depression, anxiety, and suicidal ideation.      Objective:  BP 120/82 (BP Location: Left Arm, Patient Position: Sitting, Cuff Size: Normal)   Pulse (!) 110   Temp (!) 97.4 F (36.3 C) (Temporal)   Resp 15   Ht 5\' 3"  (1.6 m)   Wt 227 lb 9.6 oz (103.2 kg)   SpO2 99%   BMI 40.32 kg/m   Physical Exam   General appearance: alert, cooperative and appears stated age Head: Normocephalic, without obvious abnormality, atraumatic Eyes: conjunctivae/corneas clear. PERRL, EOM's intact. Fundi benign. Ears: normal TM's and external ear canals both ears Nose: Nares normal. Septum midline. Mucosa normal. No drainage or sinus tenderness. Throat: lips, mucosa, and tongue normal; teeth and gums normal Neck: no adenopathy, no carotid bruit, no JVD, supple, symmetrical, trachea midline and thyroid not enlarged, symmetric, no tenderness/mass/nodules Lungs: clear to auscultation bilaterally Breasts: normal appearance, no masses or tenderness Heart: regular rate and rhythm, S1, S2 normal, no murmur, click, rub or gallop Abdomen: soft, non-tender; bowel sounds normal; no masses,  no organomegaly Extremities: extremities normal, atraumatic, no cyanosis or edema Pulses: 2+ and symmetric Skin: Skin color, texture, turgor normal. No rashes or lesions Neurologic: Alert and oriented X 3, normal strength and tone. Normal symmetric reflexes. Normal coordination and  gait.      Assessment & Plan:   Problem List Items Addressed This Visit    None      I am having Arva A. Arif maintain her atorvastatin, amitriptyline, losartan-hydrochlorothiazide, polyethylene glycol powder, furosemide, metFORMIN, buPROPion, Vitamin D (Ergocalciferol), pantoprazole, and ALPRAZolam.  No orders of the  defined types were placed in this encounter.   There are no discontinued medications.  Follow-up: No follow-ups on file.   [name], MD

## 2019-08-18 NOTE — Assessment & Plan Note (Signed)

## 2019-09-04 ENCOUNTER — Other Ambulatory Visit: Payer: Self-pay | Admitting: Internal Medicine

## 2019-09-21 LAB — HM DIABETES EYE EXAM

## 2019-10-31 ENCOUNTER — Other Ambulatory Visit: Payer: Self-pay | Admitting: Internal Medicine

## 2019-11-01 DIAGNOSIS — H5213 Myopia, bilateral: Secondary | ICD-10-CM | POA: Diagnosis not present

## 2020-01-17 ENCOUNTER — Other Ambulatory Visit: Payer: Self-pay

## 2020-01-17 ENCOUNTER — Ambulatory Visit (INDEPENDENT_AMBULATORY_CARE_PROVIDER_SITE_OTHER): Payer: 59

## 2020-01-17 ENCOUNTER — Encounter: Payer: Self-pay | Admitting: Podiatry

## 2020-01-17 ENCOUNTER — Ambulatory Visit: Payer: 59 | Admitting: Podiatry

## 2020-01-17 VITALS — Temp 98.0°F

## 2020-01-17 DIAGNOSIS — M79671 Pain in right foot: Secondary | ICD-10-CM | POA: Diagnosis not present

## 2020-01-17 DIAGNOSIS — M722 Plantar fascial fibromatosis: Secondary | ICD-10-CM

## 2020-01-18 ENCOUNTER — Encounter: Payer: Self-pay | Admitting: Podiatry

## 2020-01-18 NOTE — Progress Notes (Signed)
Subjective:  Patient ID: Sheri [name], female    DOB: [birth year],  MRN: [id number]  Chief Complaint  Patient presents with  . Foot Pain    Patient presents today for right heel pain x 2-3 weeks.  She states "its the inside of my heel and it feels like pins and needles sticking me.  It throbs at night when I lay down"  She has been taking Ibuprofen and using frozen water bottle for relief    64 y.o. female presents with the above complaint.  Patient presents with right heel pain that has been going about 2 to 3 weeks as progressing on worse.  Patient states in the medial side of the heel.  She has pins-and-needles type of pain.  She is experienced post static dyskinesia type of symptoms with worse in the morning.  She states that she is taken ibuprofen and frozen ice water and splinting it with a night splint that she received from a friend.  She denies any other acute complaints.  She was last seen here about 5 years ago.  She denies any other acute complaints.  She has never seen anyone for plantar fasciitis prior to seeing me.  Pain scale 7 out of 10.   Review of Systems: Negative except as noted in the HPI. Denies N/V/F/Ch.  Past Medical History:  Diagnosis Date  . Abdominal pain, epigastric   . Anxiety   . Back pain   . Diabetes mellitus, type 2 (HCC)   . Diverticulosis of colon without hemorrhage   . Duodenitis with hemorrhage   . Edema   . Fluid retention   . GERD (gastroesophageal reflux disease)   . Heart murmur   . Hyperlipidemia   . Hypertension   . Leg edema   . Mitral regurgitation   . Obesity, unspecified   . Osteopenia   . Panic disorder without agoraphobia   . Peripheral neuropathy   . Personal history of malignant neoplasm of cervix uteri    CIN-3 1989, normal since, gets pap smears annually  . Personal history of other genital system and obstetric disorders(V13.29)   . Prediabetes   . Rash and other nonspecific skin eruption   . Reflux esophagitis   .  Swallowing difficulty   . Unspecified hereditary and idiopathic peripheral neuropathy   . Unspecified sleep apnea    sleep study- [facility]  . Vitamin D deficiency     Current Outpatient Medications:  .  ALPRAZolam (XANAX) 0.5 MG tablet, TAKE 1 TABLET BY MOUTH TWICE DAILY AS NEEDED FOR ANXIETY OR SLEEP, Disp: 45 tablet, Rfl: 5 .  amitriptyline (ELAVIL) 25 MG tablet, TAKE 3 TABLETS BY MOUTH AT BEDTIME FOR NEUROPATHY, Disp: 270 tablet, Rfl: 1 .  atorvastatin (LIPITOR) 20 MG tablet, TAKE 1 TABLET BY MOUTH DAILY., Disp: 90 tablet, Rfl: 3 .  furosemide (LASIX) 20 MG tablet, TAKE 1 TABLET BY MOUTH ONCE DAILY AS NEEDED FOR FLUID RETENTION, Disp: 30 tablet, Rfl: 3 .  hydrochlorothiazide (MICROZIDE) 12.5 MG capsule, Take 12.5 mg by mouth daily., Disp: , Rfl:  .  losartan-hydrochlorothiazide (HYZAAR) 50-12.5 MG tablet, Take 1 tablet by mouth at bedtime., Disp: 90 tablet, Rfl: 3 .  methocarbamol (ROBAXIN) 500 MG tablet, Take 1 tablet (500 mg total) by mouth 4 (four) times daily., Disp: 30 tablet, Rfl: 3 .  pantoprazole (PROTONIX) 40 MG tablet, Take 1 tablet (40 mg total) by mouth daily., Disp: 90 tablet, Rfl: 3 .  Vitamin D, Ergocalciferol, (DRISDOL) 1.25 MG (50000 UT) CAPS  capsule, Take 1 capsule (50,000 Units total) by mouth every 7 (seven) days. (Patient not taking: Reported on 08/15/2019), Disp: 5 capsule, Rfl: 0  Social History   Tobacco Use  Smoking Status Never Smoker  Smokeless Tobacco Never Used    No Known Allergies Objective:   Vitals:   01/17/20 1431  Temp: 98 F (36.7 C)   There is no height or weight on file to calculate BMI. Constitutional Well developed. Well nourished.  Vascular Dorsalis pedis pulses palpable bilaterally. Posterior tibial pulses palpable bilaterally. Capillary refill normal to all digits.  No cyanosis or clubbing noted. Pedal hair growth normal.  Neurologic Normal speech. Oriented to person, place, and time. Epicritic sensation to light touch grossly  present bilaterally.  Dermatologic Nails well groomed and normal in appearance. No open wounds. No skin lesions.  Orthopedic: Normal joint ROM without pain or crepitus bilaterally. No visible deformities. Tender to palpation at the calcaneal tuber right. No pain with calcaneal squeeze right. Ankle ROM diminished range of motion right. Silfverskiold Test: positive right.   Radiographs: Taken and reviewed. No acute fractures or dislocations. No evidence of stress fracture.  Plantar heel spur present. Posterior heel spur present.   Assessment:   1. Plantar fasciitis of right foot   2. Pain of right heel    Plan:  Patient was evaluated and treated and all questions answered.  Plantar Fasciitis, right - XR reviewed as above.  - Educated on icing and stretching. Instructions given.  - Injection delivered to the plantar fascia as below. - DME: Plantar Fascial Brace - Pharmacologic management: None  Procedure: Injection Tendon/Ligament Location: Right plantar fascia at the glabrous junction; medial approach. Skin Prep: alcohol Injectate: 0.5 cc 0.5% marcaine plain, 0.5 cc of 1% Lidocaine, 0.5 cc kenalog 10. Disposition: Patient tolerated procedure well. Injection site dressed with a band-aid.  No follow-ups on file.

## 2020-02-06 ENCOUNTER — Other Ambulatory Visit: Payer: Self-pay | Admitting: Internal Medicine

## 2020-02-06 DIAGNOSIS — E1140 Type 2 diabetes mellitus with diabetic neuropathy, unspecified: Secondary | ICD-10-CM

## 2020-02-06 DIAGNOSIS — E1169 Type 2 diabetes mellitus with other specified complication: Secondary | ICD-10-CM

## 2020-02-06 DIAGNOSIS — E785 Hyperlipidemia, unspecified: Secondary | ICD-10-CM

## 2020-02-06 NOTE — Telephone Encounter (Signed)
Refill request for xanax, last seen 08-15-19, last filled 08-08-19.  Please advise.

## 2020-02-08 DIAGNOSIS — D2272 Melanocytic nevi of left lower limb, including hip: Secondary | ICD-10-CM | POA: Diagnosis not present

## 2020-02-08 DIAGNOSIS — D2262 Melanocytic nevi of left upper limb, including shoulder: Secondary | ICD-10-CM | POA: Diagnosis not present

## 2020-02-08 DIAGNOSIS — D2261 Melanocytic nevi of right upper limb, including shoulder: Secondary | ICD-10-CM | POA: Diagnosis not present

## 2020-02-08 DIAGNOSIS — D225 Melanocytic nevi of trunk: Secondary | ICD-10-CM | POA: Diagnosis not present

## 2020-02-08 DIAGNOSIS — D2271 Melanocytic nevi of right lower limb, including hip: Secondary | ICD-10-CM | POA: Diagnosis not present

## 2020-02-08 DIAGNOSIS — L821 Other seborrheic keratosis: Secondary | ICD-10-CM | POA: Diagnosis not present

## 2020-02-14 ENCOUNTER — Other Ambulatory Visit: Payer: Self-pay

## 2020-02-14 ENCOUNTER — Ambulatory Visit: Payer: 59 | Admitting: Podiatry

## 2020-02-14 DIAGNOSIS — M722 Plantar fascial fibromatosis: Secondary | ICD-10-CM | POA: Diagnosis not present

## 2020-02-14 DIAGNOSIS — M79671 Pain in right foot: Secondary | ICD-10-CM | POA: Diagnosis not present

## 2020-02-14 DIAGNOSIS — Q666 Other congenital valgus deformities of feet: Secondary | ICD-10-CM | POA: Diagnosis not present

## 2020-02-15 ENCOUNTER — Other Ambulatory Visit: Payer: Self-pay

## 2020-02-19 ENCOUNTER — Ambulatory Visit: Payer: 59 | Admitting: Internal Medicine

## 2020-02-19 ENCOUNTER — Encounter: Payer: Self-pay | Admitting: Internal Medicine

## 2020-02-19 ENCOUNTER — Other Ambulatory Visit: Payer: Self-pay

## 2020-02-19 DIAGNOSIS — F3289 Other specified depressive episodes: Secondary | ICD-10-CM

## 2020-02-19 DIAGNOSIS — E1169 Type 2 diabetes mellitus with other specified complication: Secondary | ICD-10-CM | POA: Diagnosis not present

## 2020-02-19 DIAGNOSIS — I10 Essential (primary) hypertension: Secondary | ICD-10-CM | POA: Diagnosis not present

## 2020-02-19 DIAGNOSIS — Z6839 Body mass index (BMI) 39.0-39.9, adult: Secondary | ICD-10-CM | POA: Diagnosis not present

## 2020-02-19 DIAGNOSIS — E1140 Type 2 diabetes mellitus with diabetic neuropathy, unspecified: Secondary | ICD-10-CM

## 2020-02-19 DIAGNOSIS — E785 Hyperlipidemia, unspecified: Secondary | ICD-10-CM | POA: Diagnosis not present
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
## 2020-02-19 NOTE — Assessment & Plan Note (Signed)
I have congratulated her in reduction of   BMI and encouraged  Continued weight loss with adherence to [place]

## 2020-02-19 NOTE — Patient Instructions (Addendum)
KEEP DOING WHAT YOU ARE DOING!  IT'S WORKING  If your blood pressure drops below 110/70,  You can stop the hctz
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
## 2020-02-19 NOTE — Assessment & Plan Note (Signed)
Well controlled on current regimen. Renal function is due.  no changes today.  Lab Results  Component Value Date   CREATININE 0.68 06/25/2019   Lab Results  Component Value Date   NA 141 06/25/2019   K 4.2 06/25/2019   CL 106 06/25/2019   CO2 23 06/25/2019
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
## 2020-02-20 ENCOUNTER — Encounter: Payer: Self-pay | Admitting: Podiatry

## 2020-02-20 LAB — COMPREHENSIVE METABOLIC PANEL
ALT: 28 U/L (ref 0–35)
AST: 21 U/L (ref 0–37)
Albumin: 5 g/dL (ref 3.5–5.2)
Alkaline Phosphatase: 71 U/L (ref 39–117)
BUN: 18 mg/dL (ref 6–23)
CO2: 25 mEq/L (ref 19–32)
Calcium: 10.3 mg/dL (ref 8.4–10.5)
Chloride: 102 mEq/L (ref 96–112)
Creatinine, Ser: 0.64 mg/dL (ref 0.40–1.20)
GFR: 93.46 mL/min (ref >60.00)
Glucose, Bld: 114 mg/dL — ABNORMAL HIGH (ref 70–99)
Potassium: 3.9 mEq/L (ref 3.5–5.1)
Sodium: 136 mEq/L (ref 135–145)
Total Bilirubin: 1.3 mg/dL — ABNORMAL HIGH (ref 0.2–1.2)
Total Protein: 7.4 g/dL (ref 6.0–8.3)

## 2020-02-20 LAB — LIPID PANEL
Cholesterol: 122 mg/dL (ref 0–200)
HDL: 39.3 mg/dL (ref >39.00)
LDL Cholesterol: 58 mg/dL (ref 0–99)
NonHDL: 83.12
Total CHOL/HDL Ratio: 3
Triglycerides: 124 mg/dL (ref 0.0–149.0)
VLDL: 24.8 mg/dL (ref 0.0–40.0)

## 2020-02-20 LAB — MICROALBUMIN / CREATININE URINE RATIO
Creatinine,U: 25.2 mg/dL
Microalb Creat Ratio: 2.8 mg/g (ref 0.0–30.0)
Microalb, Ur: <0.7 mg/dL (ref 0.0–1.9)

## 2020-02-20 LAB — HEMOGLOBIN A1C: Hgb A1c MFr Bld: 6 % (ref 4.6–6.5)

## 2020-02-20 NOTE — Progress Notes (Signed)
Subjective:  Patient ID: Sheri [name], female    DOB: 04/24/1956,  MRN: [id number]  Chief Complaint  Patient presents with  . Foot Pain    64 y.o. female presents with the above complaint.  Patient presents with a follow-up of right plantar fasciitis.  Patient still in pain but there has been a minor improvement.  Patient states that she cannot wear flat shoes and has bought new ones.  Pain scale 6 out of 10.  The injection helped temporarily.  Patient lost the plantar fascial brace.  She would like to discuss if another injection is needed as opposed to long-term management of plantar fasciitis.  She denies any other acute complaints.   Review of Systems: Negative except as noted in the HPI. Denies N/V/F/Ch.  Past Medical History:  Diagnosis Date  . Abdominal pain, epigastric   . Anxiety   . Back pain   . Diabetes mellitus, type 2 (HCC)   . Diverticulosis of colon without hemorrhage   . Duodenitis with hemorrhage   . Edema   . Fluid retention   . GERD (gastroesophageal reflux disease)   . Heart murmur   . Hyperlipidemia   . Hypertension   . Leg edema   . Mitral regurgitation   . Obesity, unspecified   . Osteopenia   . Panic disorder without agoraphobia   . Peripheral neuropathy   . Personal history of malignant neoplasm of cervix uteri    CIN-3 1989, normal since, gets pap smears annually  . Personal history of other genital system and obstetric disorders(V13.29)   . Prediabetes   . Rash and other nonspecific skin eruption   . Reflux esophagitis   . Swallowing difficulty   . Unspecified hereditary and idiopathic peripheral neuropathy   . Unspecified sleep apnea    sleep study- [facility]  . Vitamin D deficiency     Current Outpatient Medications:  .  ALPRAZolam (XANAX) 0.5 MG tablet, TAKE 1 TABLET BY MOUTH TWICE DAILY AS NEEDED FOR ANXIETY OR SLEEP, Disp: 45 tablet, Rfl: 0 .  amitriptyline (ELAVIL) 25 MG tablet, TAKE 3 TABLETS BY MOUTH AT BEDTIME FOR NEUROPATHY, Disp:  270 tablet, Rfl: 1 .  atorvastatin (LIPITOR) 20 MG tablet, TAKE 1 TABLET BY MOUTH DAILY., Disp: 90 tablet, Rfl: 3 .  furosemide (LASIX) 20 MG tablet, TAKE 1 TABLET BY MOUTH ONCE DAILY AS NEEDED FOR FLUID RETENTION, Disp: 30 tablet, Rfl: 3 .  hydrochlorothiazide (MICROZIDE) 12.5 MG capsule, Take 12.5 mg by mouth daily., Disp: , Rfl:  .  losartan-hydrochlorothiazide (HYZAAR) 50-12.5 MG tablet, Take 1 tablet by mouth at bedtime., Disp: 90 tablet, Rfl: 3 .  methocarbamol (ROBAXIN) 500 MG tablet, Take 1 tablet (500 mg total) by mouth 4 (four) times daily., Disp: 30 tablet, Rfl: 3 .  pantoprazole (PROTONIX) 40 MG tablet, Take 1 tablet (40 mg total) by mouth daily., Disp: 90 tablet, Rfl: 3  Social History   Tobacco Use  Smoking Status Never Smoker  Smokeless Tobacco Never Used    No Known Allergies Objective:   There were no vitals filed for this visit. There is no height or weight on file to calculate BMI. Constitutional Well developed. Well nourished.  Vascular Dorsalis pedis pulses palpable bilaterally. Posterior tibial pulses palpable bilaterally. Capillary refill normal to all digits.  No cyanosis or clubbing noted. Pedal hair growth normal.  Neurologic Normal speech. Oriented to person, place, and time. Epicritic sensation to light touch grossly present bilaterally.  Dermatologic Nails well groomed and normal in  appearance. No open wounds. No skin lesions.  Orthopedic: Normal joint ROM without pain or crepitus bilaterally. No visible deformities. Tender to palpation at the calcaneal tuber right. No pain with calcaneal squeeze right. Ankle ROM diminished range of motion right. Silfverskiold Test: positive right.   Radiographs: Taken and reviewed. No acute fractures or dislocations. No evidence of stress fracture.  Plantar heel spur present. Posterior heel spur present.   Assessment:   1. Plantar fasciitis of right foot   2. Pain of right heel   3. Pes planovalgus     Plan:  Patient was evaluated and treated and all questions answered.  Plantar Fasciitis, right - XR reviewed as above.  - Educated on icing and stretching. Instructions given.  -Second injection delivered to the plantar fascia as below. - DME: Second plantar fascial brace was dispensed as patient lost the previous one. - Pharmacologic management: None  Pes planus semiflexible -I explained the patient the etiology of pes planus deformity and various treatment options were extensively discussed.  I believe patient will benefit from control of hindfoot motion as well as support the arch of the foot.  I believe she will benefit from custom-made orthotics to help adjust that especially relationship of plantar fasciitis. -Patient will be scheduled see rick for custom-made orthotics   Procedure: Injection Tendon/Ligament Location: Right plantar fascia at the glabrous junction; medial approach. Skin Prep: alcohol Injectate: 0.5 cc 0.5% marcaine plain, 0.5 cc of 1% Lidocaine, 0.5 cc kenalog 10. Disposition: Patient tolerated procedure well. Injection site dressed with a band-aid.  No follow-ups on file.

## 2020-03-10 ENCOUNTER — Other Ambulatory Visit: Payer: Self-pay | Admitting: Internal Medicine

## 2020-03-10 NOTE — Telephone Encounter (Signed)
Refilled: 02/06/2020 Last OV: 02/19/2020 Next OV: 06/23/2020

## 2020-03-13 ENCOUNTER — Ambulatory Visit: Payer: 59 | Admitting: Podiatry

## 2020-03-19 ENCOUNTER — Other Ambulatory Visit: Payer: 59 | Admitting: Orthotics

## 2020-06-19 ENCOUNTER — Other Ambulatory Visit: Payer: Self-pay

## 2020-06-23 ENCOUNTER — Other Ambulatory Visit: Payer: Self-pay

## 2020-06-23 ENCOUNTER — Encounter: Payer: Self-pay | Admitting: Internal Medicine

## 2020-06-23 ENCOUNTER — Ambulatory Visit (INDEPENDENT_AMBULATORY_CARE_PROVIDER_SITE_OTHER): Payer: 59 | Admitting: Internal Medicine

## 2020-06-23 ENCOUNTER — Other Ambulatory Visit (HOSPITAL_COMMUNITY)
Admission: RE | Admit: 2020-06-23 | Discharge: 2020-06-23 | Disposition: A | Discharge status: Home / Self Care | Payer: 59 | Source: Ambulatory Visit | Attending: Internal Medicine | Admitting: Internal Medicine

## 2020-06-23 VITALS — BP 128/82 | HR 95 | Temp 98.6°F | Resp 15 | Ht 63.0 in | Wt 187.2 lb

## 2020-06-23 DIAGNOSIS — K2100 Gastro-esophageal reflux disease with esophagitis, without bleeding: Secondary | ICD-10-CM

## 2020-06-23 DIAGNOSIS — Z124 Encounter for screening for malignant neoplasm of cervix: Secondary | ICD-10-CM | POA: Diagnosis not present

## 2020-06-23 DIAGNOSIS — E669 Obesity, unspecified: Secondary | ICD-10-CM

## 2020-06-23 DIAGNOSIS — E785 Hyperlipidemia, unspecified: Secondary | ICD-10-CM

## 2020-06-23 DIAGNOSIS — Z0000 Encounter for general adult medical examination without abnormal findings: Secondary | ICD-10-CM | POA: Diagnosis not present

## 2020-06-23 DIAGNOSIS — I10 Essential (primary) hypertension: Secondary | ICD-10-CM | POA: Diagnosis not present

## 2020-06-23 DIAGNOSIS — E1140 Type 2 diabetes mellitus with diabetic neuropathy, unspecified: Secondary | ICD-10-CM | POA: Diagnosis not present

## 2020-06-23 DIAGNOSIS — E119 Type 2 diabetes mellitus without complications: Secondary | ICD-10-CM

## 2020-06-23 DIAGNOSIS — Z23 Encounter for immunization: Secondary | ICD-10-CM | POA: Diagnosis not present

## 2020-06-23 DIAGNOSIS — I152 Hypertension secondary to endocrine disorders: Secondary | ICD-10-CM

## 2020-06-23 DIAGNOSIS — E1169 Type 2 diabetes mellitus with other specified complication: Secondary | ICD-10-CM

## 2020-06-23 DIAGNOSIS — E1159 Type 2 diabetes mellitus with other circulatory complications: Secondary | ICD-10-CM

## 2020-06-23 NOTE — Patient Instructions (Signed)
Congratulations on losing < 20% of your body weight! You do not need blood pressure medication any more!    Regarding your weight goals:  Your first goal is to get your weight under  170  (BMI< 30)  Your final goal is < 140 lbs (BMI<25)   Health Maintenance for Postmenopausal Women Menopause is a normal process in which your ability to get pregnant comes to an end. This process happens slowly over many months or years, usually between the ages of 24 and 8. Menopause is complete when you have missed your menstrual periods for 12 months. It is important to talk with your health care provider about some of the most common conditions that affect women after menopause (postmenopausal women). These include heart disease, cancer, and bone loss (osteoporosis). Adopting a healthy lifestyle and getting preventive care can help to promote your health and wellness. The actions you take can also lower your chances of developing some of these common conditions. What should I know about menopause? During menopause, you may get a number of symptoms, such as:  Hot flashes. These can be moderate or severe.  Night sweats.  Decrease in sex drive.  Mood swings.  Headaches.  Tiredness.  Irritability.  Memory problems.  Insomnia. Choosing to treat or not to treat these symptoms is a decision that you make with your health care provider. Do I need hormone replacement therapy?  Hormone replacement therapy is effective in treating symptoms that are caused by menopause, such as hot flashes and night sweats.  Hormone replacement carries certain risks, especially as you become older. If you are thinking about using estrogen or estrogen with progestin, discuss the benefits and risks with your health care provider. What is my risk for heart disease and stroke? The risk of heart disease, heart attack, and stroke increases as you age. One of the causes may be a change in the body's hormones during menopause.  This can affect how your body uses dietary fats, triglycerides, and cholesterol. Heart attack and stroke are medical emergencies. There are many things that you can do to help prevent heart disease and stroke. Watch your blood pressure  High blood pressure causes heart disease and increases the risk of stroke. This is more likely to develop in people who have high blood pressure readings, are of African descent, or are overweight.  Have your blood pressure checked: ? Every 3-5 years if you are 5-73 years of age. ? Every year if you are 72 years old or older. Eat a healthy diet   Eat a diet that includes plenty of vegetables, fruits, low-fat dairy products, and lean protein.  Do not eat a lot of foods that are high in solid fats, added sugars, or sodium. Get regular exercise Get regular exercise. This is one of the most important things you can do for your health. Most adults should:  Try to exercise for at least 150 minutes each week. The exercise should increase your heart rate and make you sweat (moderate-intensity exercise).  Try to do strengthening exercises at least twice each week. Do these in addition to the moderate-intensity exercise.  Spend less time sitting. Even light physical activity can be beneficial. Other tips  Work with your health care provider to achieve or maintain a healthy weight.  Do not use any products that contain nicotine or tobacco, such as cigarettes, e-cigarettes, and chewing tobacco. If you need help quitting, ask your health care provider.  Know your numbers. Ask your health care  provider to check your cholesterol and your blood sugar (glucose). Continue to have your blood tested as directed by your health care provider. Do I need screening for cancer? Depending on your health history and family history, you may need to have cancer screening at different stages of your life. This may include screening for:  Breast cancer.  Cervical cancer.  Lung  cancer.  Colorectal cancer. What is my risk for osteoporosis? After menopause, you may be at increased risk for osteoporosis. Osteoporosis is a condition in which bone destruction happens more quickly than new bone creation. To help prevent osteoporosis or the bone fractures that can happen because of osteoporosis, you may take the following actions:  If you are 3-31 years old, get at least 1,000 mg of calcium and at least 600 mg of vitamin D per day.  If you are older than age 36 but younger than age 45, get at least 1,200 mg of calcium and at least 600 mg of vitamin D per day.  If you are older than age 8, get at least 1,200 mg of calcium and at least 800 mg of vitamin D per day. Smoking and drinking excessive alcohol increase the risk of osteoporosis. Eat foods that are rich in calcium and vitamin D, and do weight-bearing exercises several times each week as directed by your health care provider. How does menopause affect my mental health? Depression may occur at any age, but it is more common as you become older. Common symptoms of depression include:  Low or sad mood.  Changes in sleep patterns.  Changes in appetite or eating patterns.  Feeling an overall lack of motivation or enjoyment of activities that you previously enjoyed.  Frequent crying spells. Talk with your health care provider if you think that you are experiencing depression. General instructions See your health care provider for regular wellness exams and vaccines. This may include:  Scheduling regular health, dental, and eye exams.  Getting and maintaining your vaccines. These include: ? Influenza vaccine. Get this vaccine each year before the flu season begins. ? Pneumonia vaccine. ? Shingles vaccine. ? Tetanus, diphtheria, and pertussis (Tdap) booster vaccine. Your health care provider may also recommend other immunizations. Tell your health care provider if you have ever been abused or do not feel safe at  home. Summary  Menopause is a normal process in which your ability to get pregnant comes to an end.  This condition causes hot flashes, night sweats, decreased interest in sex, mood swings, headaches, or lack of sleep.  Treatment for this condition may include hormone replacement therapy.  Take actions to keep yourself healthy, including exercising regularly, eating a healthy diet, watching your weight, and checking your blood pressure and blood sugar levels.  Get screened for cancer and depression. Make sure that you are up to date with all your vaccines. This information is not intended to replace advice given to you by your health care provider. Make sure you discuss any questions you have with your health care provider. Document Revised: 08/30/2018 Document Reviewed: 08/30/2018 Elsevier Patient Education  2020 [employer].

## 2020-06-23 NOTE — Progress Notes (Signed)
Patient ID: Sheri [name], female    DOB: 10-05-1955  Age: 64 y.o. MRN: [id number]  The patient is here for annual  wellness examination and management of other chronic and acute problems.  This visit occurred during the SARS-CoV-2 public health emergency.  Safety protocols were in place, including screening questions prior to the visit, additional usage of staff PPE, and extensive cleaning of exam room while observing appropriate contact time as indicated for disinfecting solutions.      The risk factors are reflected in the social history.  The roster of all physicians providing medical care to patient - is listed in the Snapshot section of the chart.  Activities of daily living:  The patient is 100% independent in all ADLs: dressing, toileting, feeding as well as independent mobility  Home safety : The patient has smoke detectors in the home. They wear seatbelts.  There are no firearms at home. There is no violence in the home.   There is no risks for hepatitis, STDs or HIV. There is no   history of blood transfusion. They have no travel history to infectious disease endemic areas of the world.  The patient has seen their dentist in the last six month. They have seen their eye doctor in the last year. They admit to slight hearing difficulty with regard to whispered voices and some television programs.  They have deferred audiologic testing in the last year.  They do not  have excessive sun exposure. Discussed the need for sun protection: hats, long sleeves and use of sunscreen if there is significant sun exposure.   Diet: the importance of a healthy diet is discussed. They do have a healthy diet.  The benefits of regular aerobic exercise were discussed. She walks 4 times per week ,  20 minutes.   Depression screen: there are no signs or vegative symptoms of depression- irritability, change in appetite, anhedonia, sadness/tearfullness.  Cognitive assessment: the patient manages all their  financial and personal affairs and is actively engaged. They could relate day,date,year and events; recalled 2/3 objects at 3 minutes; performed clock-face test normally.  The following portions of the patient's history were reviewed and updated as appropriate: allergies, current medications, past family history, past medical history,  past surgical history, past social history  and problem list.  Visual acuity was not assessed per patient preference since she has regular follow up with her ophthalmologist. Hearing and body mass index were assessed and reviewed.   During the course of the visit the patient was educated and counseled about appropriate screening and preventive services including : fall prevention , diabetes screening, nutrition counseling, colorectal cancer screening, and recommended immunizations.    CC: The primary encounter diagnosis was Type 2 diabetes mellitus with diabetic neuropathy, without long-term current use of insulin (HCC). Diagnoses of Essential hypertension, benign, Hyperlipidemia associated with type 2 diabetes mellitus (HCC), Cervical cancer screening, Need for vaccine for Td (tetanus-diphtheria), Gastroesophageal reflux disease with esophagitis without hemorrhage, Obesity, diabetes, and hypertension syndrome (HCC), and Encounter for preventive health examination were also pertinent to this visit.  Type 2 DM with Obesity:  Has been adhering to the [employer] diet sice  May 4 .   47 lb loss since then .  Still losing!   She feels great; her neuropathy symptoms of burning feet have RESOLVED  Since she is eating less sugars .  GERD symptoms have resolved as well.  Sponsored/coached by Lerry Paterson   History Sheri [name] has a past medical history of  Abdominal pain, epigastric, Anxiety, Back pain, Diabetes mellitus, type 2 (HCC), Diverticulosis of colon without hemorrhage, Duodenitis with hemorrhage, Edema, Fluid retention, GERD (gastroesophageal reflux disease), Heart murmur,  Hyperlipidemia, Hypertension, Leg edema, Mitral regurgitation, Obesity, unspecified, Osteopenia, Panic disorder without agoraphobia, Peripheral neuropathy, Personal history of malignant neoplasm of cervix uteri, Personal history of other genital system and obstetric disorders(V13.29), Prediabetes, Rash and other nonspecific skin eruption, Reflux esophagitis, Swallowing difficulty, Unspecified hereditary and idiopathic peripheral neuropathy, Unspecified sleep apnea, and Vitamin D deficiency.   She has a past surgical history that includes Right oophorectomy (2001); Breast cyst aspiration; Dilation and curettage of uterus; Abdominal hysterectomy; Esophagogastroduodenoscopy (N/A, 06/09/2018); and Colonoscopy with propofol (N/A, 06/09/2018).   Her family history includes Alzheimer's disease in her father; Coronary artery disease in her father; Diabetes in her father and mother; Hyperlipidemia in her mother; Hypertension in her mother; Hypothyroidism in her mother; Obesity in her father and mother; Osteopenia in her mother.She reports that she has never smoked. She has never used smokeless tobacco. She reports current alcohol use. She reports that she does not use drugs.  Outpatient Medications Prior to Visit  Medication Sig Dispense Refill  . ALPRAZolam (XANAX) 0.5 MG tablet TAKE 1 TABLET BY MOUTH TWICE DAILY AS NEEDED FOR ANXIETY OR SLEEP 45 tablet 5  . amitriptyline (ELAVIL) 25 MG tablet TAKE 3 TABLETS BY MOUTH AT BEDTIME FOR NEUROPATHY (Patient not taking: Reported on 06/23/2020) 270 tablet 1  . atorvastatin (LIPITOR) 20 MG tablet TAKE 1 TABLET BY MOUTH DAILY. (Patient not taking: Reported on 06/23/2020) 90 tablet 3  . furosemide (LASIX) 20 MG tablet TAKE 1 TABLET BY MOUTH ONCE DAILY AS NEEDED FOR FLUID RETENTION (Patient not taking: Reported on 06/23/2020) 30 tablet 3  . methocarbamol (ROBAXIN) 500 MG tablet Take 1 tablet (500 mg total) by mouth 4 (four) times daily. (Patient not taking: Reported on  06/23/2020) 30 tablet 3  . hydrochlorothiazide (MICROZIDE) 12.5 MG capsule Take 12.5 mg by mouth daily. (Patient not taking: Reported on 06/23/2020)    . losartan-hydrochlorothiazide (HYZAAR) 50-12.5 MG tablet Take 1 tablet by mouth at bedtime. (Patient not taking: Reported on 06/23/2020) 90 tablet 3  . pantoprazole (PROTONIX) 40 MG tablet Take 1 tablet (40 mg total) by mouth daily. (Patient not taking: Reported on 06/23/2020) 90 tablet 3   No facility-administered medications prior to visit.    Review of Systems   Patient denies headache, fevers, malaise, unintentional weight loss, skin rash, eye pain, sinus congestion and sinus pain, sore throat, dysphagia,  hemoptysis , cough, dyspnea, wheezing, chest pain, palpitations, orthopnea, edema, abdominal pain, nausea, melena, diarrhea, constipation, flank pain, dysuria, hematuria, urinary  Frequency, nocturia, numbness, tingling, seizures,  Focal weakness, Loss of consciousness,  Tremor, insomnia, depression, anxiety, and suicidal ideation.      Objective:  BP 128/82 (BP Location: Left Arm, Patient Position: Sitting, Cuff Size: Large)   Pulse 95   Temp 98.6 F (37 C) (Oral)   Resp 15   Ht 5\' 3"  (1.6 m)   Wt 187 lb 3.2 oz (84.9 kg)   SpO2 97%   BMI 33.16 kg/m   Physical Exam  General Appearance:    Alert, cooperative, no distress, appears stated age  Head:    Normocephalic, without obvious abnormality, atraumatic  Eyes:    PERRL, conjunctiva/corneas clear, EOM's intact, fundi    benign, both eyes  Ears:    Normal TM's and external ear canals, both ears  Nose:   Nares normal, septum midline, mucosa normal, no  drainage    or sinus tenderness  Throat:   Lips, mucosa, and tongue normal; teeth and gums normal  Neck:   Supple, symmetrical, trachea midline, no adenopathy;    thyroid:  no enlargement/tenderness/nodules; no carotid   bruit or JVD  Back:     Symmetric, no curvature, ROM normal, no CVA tenderness  Lungs:     Clear to  auscultation bilaterally, respirations unlabored  Chest Wall:    No tenderness or deformity   Heart:    Regular rate and rhythm, S1 and S2 normal, no murmur, rub   or gallop  Breast Exam:    No tenderness, masses, or nipple abnormality  Abdomen:     Soft, non-tender, bowel sounds active all four quadrants,    no masses, no organomegaly  Genitalia:    Pelvic: cervix normal in appearance, external genitalia normal, no adnexal masses or tenderness, no cervical motion tenderness, rectovaginal septum normal, uterus normal size, shape, and consistency and vagina normal without discharge  Extremities:   Extremities normal, atraumatic, no cyanosis or edema  Pulses:   2+ and symmetric all extremities  Skin:   Skin color, texture, turgor normal, no rashes or lesions  Lymph nodes:   Cervical, supraclavicular, and axillary nodes normal  Neurologic:   CNII-XII intact, normal strength, sensation and reflexes    throughout      Assessment & Plan:   Problem List Items Addressed This Visit      Unprioritized   Essential hypertension, benign   Relevant Orders   Comprehensive metabolic panel (Completed)   Encounter for preventive health examination    age appropriate education and counseling updated, referrals for preventative services and immunizations addressed, dietary and smoking counseling addressed, most recent labs reviewed.  I have personally reviewed and have noted:  1) the patient's medical and social history 2) The pt's use of alcohol, tobacco, and illicit drugs 3) The patient's current medications and supplements 4) Functional ability including ADL's, fall risk, home safety risk, hearing and visual impairment 5) Diet and physical activities 6) Evidence for depression or mood disorder 7) The patient's height, weight, and BMI have been recorded in the chart  I have made referrals, and provided counseling and education based on review of the above      Hyperlipidemia associated with  type 2 diabetes mellitus (HCC)    Managed previously with atorvastatin 20 mg.  Current lipid panel is untreated.   Lab Results  Component Value Date   CHOL 191 06/23/2020   HDL 41.50 06/23/2020   LDLCALC 126 (H) 06/23/2020   LDLDIRECT 97.0 08/11/2016   TRIG 116.0 06/23/2020   CHOLHDL 5 06/23/2020         Relevant Orders   Lipid panel (Completed)   Obesity, diabetes, and hypertension syndrome (HCC)    I have congratulated her in reduction of   BMI and  encouraged  Continued weight loss  using a low glycemic index diet and regular exercise a minimum of 5 days per week.  She has been able to suspend BP medications as a result of 20% body weight loss        Reflux esophagitis    Resolved with weight loss and careful diet.       Type 2 diabetes mellitus with diabetic neuropathy, without long-term current use of insulin (HCC) - Primary    Neuropathy has resolved with weight loss. A1c is now in normal/prediabetes range on diet alone.   Lab Results  Component Value Date  HGBA1C 5.7 06/23/2020   Lab Results  Component Value Date   LABMICR 14.9 05/30/2018   MICROALBUR <0.7 06/23/2020   MICROALBUR <0.7 02/19/2020           Relevant Orders   Hemoglobin A1c (Completed)   Comprehensive metabolic panel (Completed)   Microalbumin / creatinine urine ratio (Completed)    Other Visit Diagnoses    Cervical cancer screening       Relevant Orders   Cytology - PAP( [facility])   Need for vaccine for Td (tetanus-diphtheria)       Relevant Orders   Td : Tetanus/diphtheria >7yo Preservative  free (Completed)      I have discontinued Sheri [name]'s losartan-hydrochlorothiazide, pantoprazole, and hydrochlorothiazide. I am also having her maintain her furosemide, methocarbamol, atorvastatin, amitriptyline, and ALPRAZolam.  No orders of the defined types were placed in this encounter.   Medications Discontinued During This Encounter  Medication Reason  .  losartan-hydrochlorothiazide (HYZAAR) 50-12.5 MG tablet   . hydrochlorothiazide (MICROZIDE) 12.5 MG capsule   . pantoprazole (PROTONIX) 40 MG tablet     Follow-up: Return in about 6 months (around 12/22/2020).   [name], MD

## 2020-06-24 LAB — LIPID PANEL
Cholesterol: 191 mg/dL (ref 0–200)
HDL: 41.5 mg/dL (ref >39.00)
LDL Cholesterol: 126 mg/dL — ABNORMAL HIGH (ref 0–99)
NonHDL: 149.57
Total CHOL/HDL Ratio: 5
Triglycerides: 116 mg/dL (ref 0.0–149.0)
VLDL: 23.2 mg/dL (ref 0.0–40.0)

## 2020-06-24 LAB — COMPREHENSIVE METABOLIC PANEL
ALT: 14 U/L (ref 0–35)
AST: 17 U/L (ref 0–37)
Albumin: 4.6 g/dL (ref 3.5–5.2)
Alkaline Phosphatase: 65 U/L (ref 39–117)
BUN: 17 mg/dL (ref 6–23)
CO2: 25 mEq/L (ref 19–32)
Calcium: 9.9 mg/dL (ref 8.4–10.5)
Chloride: 104 mEq/L (ref 96–112)
Creatinine, Ser: 0.61 mg/dL (ref 0.40–1.20)
GFR: 95.65 mL/min (ref >60.00)
Glucose, Bld: 82 mg/dL (ref 70–99)
Potassium: 4.2 mEq/L (ref 3.5–5.1)
Sodium: 139 mEq/L (ref 135–145)
Total Bilirubin: 1 mg/dL (ref 0.2–1.2)
Total Protein: 7.2 g/dL (ref 6.0–8.3)

## 2020-06-24 LAB — MICROALBUMIN / CREATININE URINE RATIO
Creatinine,U: 45.3 mg/dL
Microalb Creat Ratio: 1.5 mg/g (ref 0.0–30.0)
Microalb, Ur: <0.7 mg/dL (ref 0.0–1.9)

## 2020-06-24 LAB — HEMOGLOBIN A1C: Hgb A1c MFr Bld: 5.7 % (ref 4.6–6.5)

## 2020-06-24 NOTE — Assessment & Plan Note (Signed)
Neuropathy has resolved with weight loss. A1c is now in normal/prediabetes range on diet alone.   Lab Results  Component Value Date   HGBA1C 5.7 06/23/2020   Lab Results  Component Value Date   LABMICR 14.9 05/30/2018   MICROALBUR <0.7 06/23/2020   MICROALBUR <0.7 02/19/2020

## 2020-06-24 NOTE — Assessment & Plan Note (Signed)
Managed previously with atorvastatin 20 mg.  Current lipid panel is untreated.   Lab Results  Component Value Date   CHOL 191 06/23/2020   HDL 41.50 06/23/2020   LDLCALC 126 (H) 06/23/2020   LDLDIRECT 97.0 08/11/2016   TRIG 116.0 06/23/2020   CHOLHDL 5 06/23/2020

## 2020-06-24 NOTE — Assessment & Plan Note (Signed)
Resolved with weight loss and careful diet.

## 2020-06-24 NOTE — Assessment & Plan Note (Signed)

## 2020-06-24 NOTE — Assessment & Plan Note (Addendum)
I have congratulated her in reduction of   BMI and  encouraged  Continued weight loss  using a low glycemic index diet and regular exercise a minimum of 5 days per week.  She has been able to suspend BP medications as a result of 20% body weight loss

## 2020-06-25 LAB — CYTOLOGY - PAP
Comment: NEGATIVE
Diagnosis: NEGATIVE
High risk HPV: NEGATIVE

## 2020-06-27 ENCOUNTER — Other Ambulatory Visit: Payer: Self-pay | Admitting: Internal Medicine

## 2020-06-27 DIAGNOSIS — Z1231 Encounter for screening mammogram for malignant neoplasm of breast: Secondary | ICD-10-CM

## 2020-07-28 ENCOUNTER — Other Ambulatory Visit: Payer: Self-pay | Admitting: Internal Medicine

## 2020-07-30 ENCOUNTER — Other Ambulatory Visit: Payer: Self-pay

## 2020-07-30 ENCOUNTER — Ambulatory Visit
Admission: RE | Admit: 2020-07-30 | Discharge: 2020-07-30 | Disposition: A | Discharge status: Home / Self Care | Payer: 59 | Source: Ambulatory Visit | Attending: Internal Medicine | Admitting: Internal Medicine

## 2020-07-30 DIAGNOSIS — Z1231 Encounter for screening mammogram for malignant neoplasm of breast: Secondary | ICD-10-CM | POA: Diagnosis not present

## 2020-10-14 ENCOUNTER — Other Ambulatory Visit: Payer: Self-pay | Admitting: Internal Medicine

## 2020-10-14 NOTE — Telephone Encounter (Signed)
RX Refill:xanax Last Seen:06-23-20 Last ordered:03-10-20

## 2020-10-15 ENCOUNTER — Other Ambulatory Visit: Payer: Self-pay | Admitting: Internal Medicine

## 2020-10-26 ENCOUNTER — Other Ambulatory Visit: Payer: Self-pay | Admitting: Student

## 2020-10-26 DIAGNOSIS — M5416 Radiculopathy, lumbar region: Secondary | ICD-10-CM | POA: Diagnosis not present

## 2020-11-11 DIAGNOSIS — M5416 Radiculopathy, lumbar region: Secondary | ICD-10-CM | POA: Diagnosis not present

## 2020-11-21 ENCOUNTER — Other Ambulatory Visit: Payer: Self-pay | Admitting: Orthopedic Surgery

## 2020-11-21 DIAGNOSIS — M5416 Radiculopathy, lumbar region: Secondary | ICD-10-CM | POA: Diagnosis not present

## 2020-12-17 ENCOUNTER — Other Ambulatory Visit: Payer: Self-pay

## 2020-12-22 ENCOUNTER — Other Ambulatory Visit: Payer: Self-pay

## 2020-12-22 ENCOUNTER — Ambulatory Visit: Payer: 59 | Admitting: Internal Medicine

## 2020-12-22 ENCOUNTER — Encounter: Payer: Self-pay | Admitting: Internal Medicine

## 2020-12-22 VITALS — BP 126/92 | HR 87 | Temp 98.1°F | Resp 16 | Ht 63.0 in | Wt 219.8 lb

## 2020-12-22 DIAGNOSIS — E669 Obesity, unspecified: Secondary | ICD-10-CM

## 2020-12-22 DIAGNOSIS — F320 Major depressive disorder, single episode, mild: Secondary | ICD-10-CM | POA: Diagnosis not present

## 2020-12-22 DIAGNOSIS — E1169 Type 2 diabetes mellitus with other specified complication: Secondary | ICD-10-CM | POA: Diagnosis not present

## 2020-12-22 DIAGNOSIS — Z8616 Personal history of COVID-19: Secondary | ICD-10-CM

## 2020-12-22 DIAGNOSIS — E1159 Type 2 diabetes mellitus with other circulatory complications: Secondary | ICD-10-CM | POA: Diagnosis not present

## 2020-12-22 DIAGNOSIS — F329 Major depressive disorder, single episode, unspecified: Secondary | ICD-10-CM

## 2020-12-22 DIAGNOSIS — E785 Hyperlipidemia, unspecified: Secondary | ICD-10-CM

## 2020-12-22 DIAGNOSIS — I152 Hypertension secondary to endocrine disorders: Secondary | ICD-10-CM

## 2020-12-22 LAB — HEMOGLOBIN A1C: Hgb A1c MFr Bld: 5.8 % (ref 4.6–6.5)

## 2020-12-22 LAB — LIPID PANEL
Cholesterol: 230 mg/dL — ABNORMAL HIGH (ref 0–200)
HDL: 54.2 mg/dL (ref >39.00)
LDL Cholesterol: 137 mg/dL — ABNORMAL HIGH (ref 0–99)
NonHDL: 176.16
Total CHOL/HDL Ratio: 4
Triglycerides: 195 mg/dL — ABNORMAL HIGH (ref 0.0–149.0)
VLDL: 39 mg/dL (ref 0.0–40.0)

## 2020-12-22 LAB — TSH: TSH: 1.36 u[IU]/mL (ref 0.35–4.50)

## 2020-12-22 LAB — COMPREHENSIVE METABOLIC PANEL
ALT: 19 U/L (ref 0–35)
AST: 17 U/L (ref 0–37)
Albumin: 4.3 g/dL (ref 3.5–5.2)
Alkaline Phosphatase: 77 U/L (ref 39–117)
BUN: 16 mg/dL (ref 6–23)
CO2: 24 mEq/L (ref 19–32)
Calcium: 9.6 mg/dL (ref 8.4–10.5)
Chloride: 105 mEq/L (ref 96–112)
Creatinine, Ser: 0.56 mg/dL (ref 0.40–1.20)
GFR: 96.24 mL/min (ref >60.00)
Glucose, Bld: 106 mg/dL — ABNORMAL HIGH (ref 70–99)
Potassium: 4.1 mEq/L (ref 3.5–5.1)
Sodium: 138 mEq/L (ref 135–145)
Total Bilirubin: 1.3 mg/dL — ABNORMAL HIGH (ref 0.2–1.2)
Total Protein: 6.9 g/dL (ref 6.0–8.3)

## 2020-12-22 MED ORDER — ESCITALOPRAM OXALATE 10 MG PO TABS
10.0000 mg | ORAL_TABLET | Freq: Every day | ORAL | 2 refills | Status: DC
  Filled 2020-12-22: qty 30, 30d supply, fill #0
  Filled 2021-02-02: qty 30, 30d supply, fill #1
  Filled 2021-03-11: qty 30, 30d supply, fill #2

## 2020-12-22 MED ORDER — METHOCARBAMOL 500 MG PO TABS
500.0000 mg | ORAL_TABLET | Freq: Three times a day (TID) | ORAL | 3 refills | Status: DC
  Filled 2020-12-22: qty 90, 30d supply, fill #0
  Filled 2021-02-27: qty 90, 30d supply, fill #1
  Filled 2021-03-25: qty 90, 30d supply, fill #2

## 2020-12-22 MED ORDER — MELOXICAM 7.5 MG PO TABS
ORAL_TABLET | Freq: Every day | ORAL | 5 refills | Status: AC
  Filled 2020-12-22: qty 30, 30d supply, fill #0
  Filled 2021-02-02: qty 30, 30d supply, fill #1
  Filled 2021-08-07: qty 30, 30d supply, fill #2

## 2020-12-22 MED ORDER — PANTOPRAZOLE SODIUM 40 MG PO TBEC
40.0000 mg | DELAYED_RELEASE_TABLET | Freq: Every day | ORAL | 3 refills | Status: DC
  Filled 2020-12-22: qty 30, 30d supply, fill #0
  Filled 2021-03-25: qty 30, 30d supply, fill #1

## 2020-12-22 MED ORDER — AMITRIPTYLINE HCL 25 MG PO TABS
ORAL_TABLET | ORAL | 1 refills | Status: DC
  Filled 2020-12-22: qty 270, 90d supply, fill #0
  Filled 2021-03-25: qty 270, 90d supply, fill #1

## 2020-12-22 MED ORDER — GABAPENTIN 300 MG PO CAPS
ORAL_CAPSULE | Freq: Every day | ORAL | 3 refills | Status: DC
  Filled 2020-12-22: qty 30, 30d supply, fill #0
  Filled 2021-02-02: qty 30, 30d supply, fill #1
  Filled 2021-03-25: qty 30, 30d supply, fill #2

## 2020-12-22 MED FILL — Alprazolam Tab 0.5 MG: ORAL | 23 days supply | Qty: 45 | Fill #0 | Status: AC

## 2020-12-22 NOTE — Assessment & Plan Note (Signed)
Occurred in early Feb 2022,  Contacted through husband [employer].  Sense of altered taste remains

## 2020-12-22 NOTE — Progress Notes (Signed)
Subjective:  Patient ID: Sheri [name], female    DOB: 10-30-1955  Age: 65 y.o. MRN: [id number]  CC: The primary encounter diagnosis was Hyperlipidemia associated with type 2 diabetes mellitus (HCC). Diagnoses of Obesity, diabetes, and hypertension syndrome (HCC), Reactive depression, History of COVID-19, and Current mild episode of major depressive disorder without prior episode [facility]) were also pertinent to this visit.  HPI Sheri [name] presents for follow up on type 2 DM. GAD and obesity  This visit occurred during the SARS-CoV-2 public health emergency.  Safety protocols were in place, including screening questions prior to the visit, additional usage of staff PPE, and extensive cleaning of exam room while observing appropriate contact time as indicated for disinfecting solutions.   Sheri [name] is a 65 yr old woman with Type 2 DM complicated by obesity who presents for follow up.  She has a positive screen for depression today.  Since her last visit she has gained 32 lbs due to COVID induced dysgeusia. Her disabled husband Sheri [name] became infected by his morning coffee friends n early February and infected patient and patient's mother. Her mother was hospitalized and has not fully recovered.  She is 65 years old and has been living with patient for the last 10 years  After a vertebral fracture ended her ability to live independently .  Persistent tailbone pain:  She was treated by [facility] I nlate February for coccygeal bruise  that occurred after falling off of a chair that slid as she  was trying to mount it to stand on it.  She fell directly onto her tailbone .  Her pain was severe and she was evaluated with  X rays of pelvis were ruled out fracture  Followed by an MRI  lumbar spine due to persistent tingling and radicular pain on right side. She was treated with a course of prednisone which transiently helped with the sciatica /tingling mostly.  PT was ordered but not done because she cited   caregiver responsibilities for mother  And husband as obstacles to getting the therapy she needed.   She states that her pain is tolerable during the day while working from home in a seated position , but is worse at night and she has run out of robaxin, meloxicam and gabapentin.    Positive depression screen. Patient cites the stress of caring for a hypercritcal unappreciative ailing mother and a lazy husband while working full time and being in constant pain as stressors.  She is resentful of the fact that neither family member tried to shoulder more responsibility when she was injured.  Her mother is verbally insulting and critical of her cooking.  [name] fired the home health aide that was responsible for mother's care because the aide ignored patient's  request to limit her activity to her mother's room and found recurrent evidence that she has been in patient's bedroom. Patient struggles to help mother in the bathroom.   Obesity:  Lost 52 lbs on [employer],  Unfortunately Has gained 32 lbs since last visit because  Since her COVID ifection "nothng tastes right except peanut butter and Graham crackers". ; she has been eating peanut butter "by the jar"  And not exercising.    She appears miserable.  She is considering retirement in September due to her constant fatigue and workload. Her mother is very critical and unappreciative of her efforts.     Outpatient Medications Prior to Visit  Medication Sig Dispense Refill  . ALPRAZolam Prudy Feeler)  0.5 MG tablet TAKE 1 TABLET BY MOUTH TWICE DAILY AS NEEDED FOR ANXIETY OR SLEEP 45 tablet 2  . furosemide (LASIX) 20 MG tablet TAKE 1 TABLET BY MOUTH ONCE DAILY AS NEEDED FOR FLUID RETENTION 30 tablet 3  . amitriptyline (ELAVIL) 25 MG tablet TAKE 3 TABLETS (75MG ) BY MOUTH AT BEDTIME FOR NEUROPATHY 270 tablet 1  . atorvastatin (LIPITOR) 20 MG tablet TAKE 1 TABLET BY MOUTH DAILY. (Patient not taking: No sig reported) 90 tablet 3  . gabapentin (NEURONTIN) 300 MG capsule  TAKE 1 CAPSULE BY MOUTH ONCE DAILY (Patient not taking: Reported on 12/22/2020) 30 capsule 0  . meloxicam (MOBIC) 7.5 MG tablet TAKE 1 TABLET BY MOUTH ONCE DAILY (Patient not taking: Reported on 12/22/2020) 30 tablet 0  . methocarbamol (ROBAXIN) 500 MG tablet Take 1 tablet (500 mg total) by mouth 4 (four) times daily. (Patient not taking: Reported on 12/22/2020) 30 tablet 3  . traMADol (ULTRAM) 50 MG tablet TAKE 1 TABLET EVERY 6-8 HOURS BY MOUTH AS NEEDED FOR 5 DAYS. (Patient not taking: Reported on 12/22/2020) 20 tablet 0   No facility-administered medications prior to visit.    Review of Systems;  Patient denies headache, fevers, malaise, unintentional weight loss, skin rash, eye pain, sinus congestion and sinus pain, sore throat, dysphagia,  hemoptysis , cough, dyspnea, wheezing, chest pain, palpitations, orthopnea, edema, abdominal pain, nausea, melena, diarrhea, constipation, flank pain, dysuria, hematuria, urinary  Frequency, nocturia, numbness, tingling, seizures,  Focal weakness, Loss of consciousness,  Tremor, insomnia, depression, anxiety, and suicidal ideation.      Objective:  BP (!) 126/92 (BP Location: Left Arm, Patient Position: Sitting, Cuff Size: Large)   Pulse 87   Temp 98.1 F (36.7 C) (Oral)   Resp 16   Ht 5\' 3"  (1.6 m)   Wt 219 lb 12.8 oz (99.7 kg)   SpO2 98%   BMI 38.94 kg/m   BP Readings from Last 3 Encounters:  12/22/20 (!) 126/92  06/23/20 128/82  02/19/20 118/78    Wt Readings from Last 3 Encounters:  12/22/20 219 lb 12.8 oz (99.7 kg)  06/23/20 187 lb 3.2 oz (84.9 kg)  02/19/20 215 lb 9.6 oz (97.8 kg)    General appearance: alert, cooperative and appears stated age Ears: normal TM's and external ear canals both ears Throat: lips, mucosa, and tongue normal; teeth and gums normal Neck: no adenopathy, no carotid bruit, supple, symmetrical, trachea midline and thyroid not enlarged, symmetric, no tenderness/mass/nodules Back: symmetric, no curvature. ROM normal.  No CVA tenderness. Lungs: clear to auscultation bilaterally Heart: regular rate and rhythm, S1, S2 normal, no murmur, click, rub or gallop Abdomen: soft, non-tender; bowel sounds normal; no masses,  no organomegaly Pulses: 2+ and symmetric Skin: Skin color, texture, turgor normal. No rashes or lesions Lymph nodes: Cervical, supraclavicular, and axillary nodes normal. Psych: affect flat, makes good eye contact. No fidgeting,  Did not smile once during the 40 minute encounter .  Denies suicidal thoughts   Lab Results  Component Value Date   HGBA1C 5.8 12/22/2020   HGBA1C 5.7 06/23/2020   HGBA1C 6.0 02/19/2020    Lab Results  Component Value Date   CREATININE 0.56 12/22/2020   CREATININE 0.61 06/23/2020   CREATININE 0.64 02/19/2020    Lab Results  Component Value Date   WBC 6.6 05/30/2018   HGB 14.4 05/30/2018   HCT 43.8 05/30/2018   PLT 301.0 05/11/2013   GLUCOSE 106 (H) 12/22/2020   CHOL 230 (H) 12/22/2020   TRIG  195.0 (H) 12/22/2020   HDL 54.20 12/22/2020   LDLDIRECT 97.0 08/11/2016   LDLCALC 137 (H) 12/22/2020   ALT 19 12/22/2020   AST 17 12/22/2020   NA 138 12/22/2020   K 4.1 12/22/2020   CL 105 12/22/2020   CREATININE 0.56 12/22/2020   BUN 16 12/22/2020   CO2 24 12/22/2020   TSH 1.36 12/22/2020   HGBA1C 5.8 12/22/2020   MICROALBUR <0.7 06/23/2020    MM 3D SCREEN BREAST BILATERAL  Result Date: 08/01/2020 CLINICAL DATA:  Screening. EXAM: DIGITAL SCREENING BILATERAL MAMMOGRAM WITH TOMO AND CAD COMPARISON:  Previous exam(s). ACR Breast Density Category d: The breast tissue is extremely dense, which lowers the sensitivity of mammography FINDINGS: There are no findings suspicious for malignancy. Images were processed with CAD. IMPRESSION: No mammographic evidence of malignancy. A result letter of this screening mammogram will be mailed directly to the patient. RECOMMENDATION: Screening mammogram in one year. (Code:SM-B-01Y) BI-RADS CATEGORY  1: Negative. Electronically  Signed   By: Edwin Cap M.D.   On: 08/01/2020 10:57    Assessment & Plan:   Problem List Items Addressed This Visit      Unprioritized   RESOLVED: Depression    With weight gain and victimization .  counselling given about her need to set boundaries with family members .  Since working from home has developed feelings of paranoia /anxiety when she leaves home .  Will start trial of lexapro and RTC  one month       Relevant Medications   amitriptyline (ELAVIL) 25 MG tablet   escitalopram (LEXAPRO) 10 MG tablet   History of COVID-19    Occurred in early Feb 2022,  Contacted through husband [place].  Sense of altered taste remains      Hyperlipidemia associated with type 2 diabetes mellitus (HCC) - Primary   Relevant Orders   TSH (Completed)   Lipid panel (Completed)   Major depressive disorder, single episode    Aggravated by weight regain, increased workload at home caring for two unappreciative family members,  And persisent coccygeal pain since her fall in February.  A total of 40 minutes was spent with patient more than half of which was spent in counseling patient on her current situation, her  need to  set boundaries with her family members and stick to them .  addig wellbutrin to celexa today.  Follow up one month       Relevant Medications   amitriptyline (ELAVIL) 25 MG tablet   escitalopram (LEXAPRO) 10 MG tablet   Obesity, diabetes, and hypertension syndrome (HCC)    Glycemic control is fine,  But Weight regain a problem.   She does not have increased appetite;  Her sense of taste has been altered by COVID infection and she is eating calorically dense peanut butter to the exclusion of all other foods. She is also depressed and unmotivated to change.  Labs today,  Return in one month after wellbutrin  start.   Lab Results  Component Value Date   HGBA1C 5.8 12/22/2020   Lab Results  Component Value Date   LABMICR 14.9 05/30/2018   MICROALBUR <0.7 06/23/2020    MICROALBUR <0.7 02/19/2020           Relevant Orders   Hemoglobin A1c (Completed)   Comprehensive metabolic panel (Completed)      I have discontinued Joany A. Picinich's traMADol. I have also changed her methocarbamol. Additionally, I am having her start on escitalopram and pantoprazole. Lastly, I am  having her maintain her furosemide, atorvastatin, ALPRAZolam, gabapentin, meloxicam, and amitriptyline.  Meds ordered this encounter  Medications  . gabapentin (NEURONTIN) 300 MG capsule    Sig: TAKE 1 CAPSULE BY MOUTH ONCE DAILY    Dispense:  30 capsule    Refill:  3  . meloxicam (MOBIC) 7.5 MG tablet    Sig: TAKE 1 TABLET BY MOUTH ONCE DAILY    Dispense:  30 tablet    Refill:  5  . methocarbamol (ROBAXIN) 500 MG tablet    Sig: Take 1 tablet (500 mg total) by mouth 3 (three) times daily.    Dispense:  90 tablet    Refill:  3  . amitriptyline (ELAVIL) 25 MG tablet    Sig: TAKE 3 TABLETS (75MG ) BY MOUTH AT BEDTIME FOR NEUROPATHY    Dispense:  270 tablet    Refill:  1  . escitalopram (LEXAPRO) 10 MG tablet    Sig: Take 1 tablet (10 mg total) by mouth daily with breakfast.    Dispense:  30 tablet    Refill:  2  . pantoprazole (PROTONIX) 40 MG tablet    Sig: Take 1 tablet (40 mg total) by mouth daily.    Dispense:  30 tablet    Refill:  3    Medications Discontinued During This Encounter  Medication Reason  . traMADol (ULTRAM) 50 MG tablet   . methocarbamol (ROBAXIN) 500 MG tablet Reorder  . meloxicam (MOBIC) 7.5 MG tablet Reorder  . gabapentin (NEURONTIN) 300 MG capsule Reorder  . amitriptyline (ELAVIL) 25 MG tablet Reorder    Follow-up: Return in about 4 weeks (around 01/19/2021).   [name], MD

## 2020-12-22 NOTE — Assessment & Plan Note (Signed)
With weight gain and victimization .  counselling given about her need to set boundaries with family members .  Since working from home has developed feelings of paranoia /anxiety when she leaves home .  Will start trial of lexapro and RTC  one month

## 2020-12-22 NOTE — Patient Instructions (Signed)
You are clinically depressed because you have devoted your life to 2 people who DO NOT APPRECIATE YOU.    IF YOU DO NOT SET BOUNDARIES WITH YOUR MOTHER AND BILLY ,  THERE WILL BE NO CHANGE IN YOUR LIFE. "TAKERS" DO NOT CHANGE.  "GIVERS" MUST DECIDE TO LIMIT THE EXTENT OF THEIR GIVING   YOUR MOTHER'S MEDICARE INSURANCE WILL PAY FOR A HOME HEALTH AIDE TO PROVIDE A BATH 3 TIMES PER WEEK    I am starting you on Lexapro .  Start with 1/2 tablet daily for breakfast for one week , then increase to full tablet  Starting you on protonix for your stomach  STOP BUYING PEANUT BUTTER.  TRY THE QUEST PEANUT BUTTER BARS.    I want you to spend 30 minutes outside ALONE  Daily for prayer time  RETURN IN ONE MONTH

## 2020-12-23 NOTE — Assessment & Plan Note (Signed)
Glycemic control is fine,  But Weight regain a problem.   She does not have increased appetite;  Her sense of taste has been altered by COVID infection and she is eating calorically dense peanut butter to the exclusion of all other foods. She is also depressed and unmotivated to change.  Labs today,  Return in one month after wellbutrin  start.   Lab Results  Component Value Date   HGBA1C 5.8 12/22/2020   Lab Results  Component Value Date   LABMICR 14.9 05/30/2018   MICROALBUR <0.7 06/23/2020   MICROALBUR <0.7 02/19/2020

## 2020-12-23 NOTE — Assessment & Plan Note (Signed)
Aggravated by weight regain, increased workload at home caring for two unappreciative family members,  And persisent coccygeal pain since her fall in February.  A total of 40 minutes was spent with patient more than half of which was spent in counseling patient on her current situation, her  need to  set boundaries with her family members and stick to them .  addig wellbutrin to celexa today.  Follow up one month

## 2021-01-19 ENCOUNTER — Ambulatory Visit: Payer: 59 | Admitting: Internal Medicine

## 2021-01-19 ENCOUNTER — Other Ambulatory Visit: Payer: Self-pay

## 2021-01-19 ENCOUNTER — Encounter: Payer: Self-pay | Admitting: Internal Medicine

## 2021-01-19 VITALS — BP 124/76 | HR 91 | Temp 98.3°F | Resp 15 | Ht 63.0 in | Wt 224.0 lb

## 2021-01-19 DIAGNOSIS — E669 Obesity, unspecified: Secondary | ICD-10-CM | POA: Diagnosis not present

## 2021-01-19 DIAGNOSIS — E1159 Type 2 diabetes mellitus with other circulatory complications: Secondary | ICD-10-CM | POA: Diagnosis not present

## 2021-01-19 DIAGNOSIS — R17 Unspecified jaundice: Secondary | ICD-10-CM

## 2021-01-19 DIAGNOSIS — F320 Major depressive disorder, single episode, mild: Secondary | ICD-10-CM

## 2021-01-19 DIAGNOSIS — I152 Hypertension secondary to endocrine disorders: Secondary | ICD-10-CM

## 2021-01-19 DIAGNOSIS — E1169 Type 2 diabetes mellitus with other specified complication: Secondary | ICD-10-CM | POA: Diagnosis not present

## 2021-01-19 LAB — HEPATIC FUNCTION PANEL
ALT: 25 U/L (ref 0–35)
AST: 20 U/L (ref 0–37)
Albumin: 4.2 g/dL (ref 3.5–5.2)
Alkaline Phosphatase: 76 U/L (ref 39–117)
Bilirubin, Direct: 0.1 mg/dL (ref 0.0–0.3)
Total Bilirubin: 0.6 mg/dL (ref 0.2–1.2)
Total Protein: 6.4 g/dL (ref 6.0–8.3)

## 2021-01-19 MED ORDER — BUPROPION HCL ER (XL) 150 MG PO TB24
150.0000 mg | ORAL_TABLET | Freq: Every day | ORAL | 2 refills | Status: DC
  Filled 2021-01-19: qty 30, 30d supply, fill #0
  Filled 2021-02-27: qty 30, 30d supply, fill #1
  Filled 2021-03-25: qty 30, 30d supply, fill #2

## 2021-01-19 NOTE — Progress Notes (Signed)
Subjective:  Patient ID: Sheri [name], female    DOB: January 12, 1956  Age: 65 y.o. MRN: [id number]  CC: The primary encounter diagnosis was Elevated bilirubin. Diagnoses of Current mild episode of major depressive disorder without prior episode (HCC) and Obesity, diabetes, and hypertension syndrome (HCC) were also pertinent to this visit.  HPI Sheri [name] presents for one month follow up on initiation of treatment for depression and weight gain with  lexapro added   This visit occurred during the SARS-CoV-2 public health emergency.  Safety protocols were in place, including screening questions prior to the visit, additional usage of staff PPE, and extensive cleaning of exam room while observing appropriate contact time as indicated for disinfecting solutions.   Feeling somewhat better since she has been standing  up to husband and mother recently and forcing them to be less dependent on her.  Her sister has been helping more and experienced first hand the ungratefulness and rudeness of her mother when she took her home for a weekend to give Althea Charon a break.  Both mother and husband  continue to treat her like a servant and appear to have no concerns for her welfare other than when it interferes with getting their own needs met.   Sheri [name]'s last encounter with psychologist was trou [facility] 10 yrs ago,   Didn't like the counsellor so she never returned after the first visit.  Currently she is receptive to the idea of returning as she recognizes that talk therapy would be helpful.  .  Willing to return   Financial stress: her Husband's medications (Ozempic, Eliquis and [place]) are costing her $1000 or more per month .       Outpatient Medications Prior to Visit  Medication Sig Dispense Refill  . ALPRAZolam (XANAX) 0.5 MG tablet TAKE 1 TABLET BY MOUTH TWICE DAILY AS NEEDED FOR ANXIETY OR SLEEP 45 tablet 2  . amitriptyline (ELAVIL) 25 MG tablet TAKE 3 TABLETS (75MG ) BY MOUTH AT BEDTIME FOR NEUROPATHY  270 tablet 1  . atorvastatin (LIPITOR) 20 MG tablet TAKE 1 TABLET BY MOUTH DAILY. 90 tablet 3  . escitalopram (LEXAPRO) 10 MG tablet Take 1 tablet (10 mg total) by mouth daily with breakfast. 30 tablet 2  . furosemide (LASIX) 20 MG tablet TAKE 1 TABLET BY MOUTH ONCE DAILY AS NEEDED FOR FLUID RETENTION 30 tablet 3  . gabapentin (NEURONTIN) 300 MG capsule TAKE 1 CAPSULE BY MOUTH ONCE DAILY 30 capsule 3  . meloxicam (MOBIC) 7.5 MG tablet TAKE 1 TABLET BY MOUTH ONCE DAILY 30 tablet 5  . methocarbamol (ROBAXIN) 500 MG tablet Take 1 tablet (500 mg total) by mouth 3 (three) times daily. 90 tablet 3  . pantoprazole (PROTONIX) 40 MG tablet Take 1 tablet (40 mg total) by mouth daily. 30 tablet 3   No facility-administered medications prior to visit.    Review of Systems;  Patient denies headache, fevers, malaise, unintentional weight loss, skin rash, eye pain, sinus congestion and sinus pain, sore throat, dysphagia,  hemoptysis , cough, dyspnea, wheezing, chest pain, palpitations, orthopnea, edema, abdominal pain, nausea, melena, diarrhea, constipation, flank pain, dysuria, hematuria, urinary  Frequency, nocturia, numbness, tingling, seizures,  Focal weakness, Loss of consciousness,  Tremor, insomnia, depression, anxiety, and suicidal ideation.      Objective:  BP 124/76 (BP Location: Left Arm, Patient Position: Sitting, Cuff Size: Normal)   Pulse 91   Temp 98.3 F (36.8 C) (Oral)   Resp 15   Ht 5\' 3"  (1.6 m)  Wt 224 lb (101.6 kg)   SpO2 96%   BMI 39.68 kg/m   BP Readings from Last 3 Encounters:  01/19/21 124/76  12/22/20 (!) 126/92  06/23/20 128/82    Wt Readings from Last 3 Encounters:  01/19/21 224 lb (101.6 kg)  12/22/20 219 lb 12.8 oz (99.7 kg)  06/23/20 187 lb 3.2 oz (84.9 kg)    General appearance: alert, cooperative and appears stated age Ears: normal TM's and external ear canals both ears Throat: lips, mucosa, and tongue normal; teeth and gums normal Neck: no adenopathy,  no carotid bruit, supple, symmetrical, trachea midline and thyroid not enlarged, symmetric, no tenderness/mass/nodules Back: symmetric, no curvature. ROM normal. No CVA tenderness. Lungs: clear to auscultation bilaterally Heart: regular rate and rhythm, S1, S2 normal, no murmur, click, rub or gallop Abdomen: soft, non-tender; bowel sounds normal; no masses,  no organomegaly Pulses: 2+ and symmetric Skin: Skin color, texture, turgor normal. No rashes or lesions Lymph nodes: Cervical, supraclavicular, and axillary nodes normal.  Lab Results  Component Value Date   HGBA1C 5.8 12/22/2020   HGBA1C 5.7 06/23/2020   HGBA1C 6.0 02/19/2020    Lab Results  Component Value Date   CREATININE 0.56 12/22/2020   CREATININE 0.61 06/23/2020   CREATININE 0.64 02/19/2020    Lab Results  Component Value Date   WBC 6.6 05/30/2018   HGB 14.4 05/30/2018   HCT 43.8 05/30/2018   PLT 301.0 05/11/2013   GLUCOSE 106 (H) 12/22/2020   CHOL 230 (H) 12/22/2020   TRIG 195.0 (H) 12/22/2020   HDL 54.20 12/22/2020   LDLDIRECT 97.0 08/11/2016   LDLCALC 137 (H) 12/22/2020   ALT 25 01/19/2021   AST 20 01/19/2021   NA 138 12/22/2020   K 4.1 12/22/2020   CL 105 12/22/2020   CREATININE 0.56 12/22/2020   BUN 16 12/22/2020   CO2 24 12/22/2020   TSH 1.36 12/22/2020   HGBA1C 5.8 12/22/2020   MICROALBUR <0.7 06/23/2020    MM 3D SCREEN BREAST BILATERAL  Result Date: 08/01/2020 CLINICAL DATA:  Screening. EXAM: DIGITAL SCREENING BILATERAL MAMMOGRAM WITH TOMO AND CAD COMPARISON:  Previous exam(s). ACR Breast Density Category d: The breast tissue is extremely dense, which lowers the sensitivity of mammography FINDINGS: There are no findings suspicious for malignancy. Images were processed with CAD. IMPRESSION: No mammographic evidence of malignancy. A result letter of this screening mammogram will be mailed directly to the patient. RECOMMENDATION: Screening mammogram in one year. (Code:SM-B-01Y) BI-RADS CATEGORY  1:  Negative. Electronically Signed   By: Edwin Cap M.D.   On: 08/01/2020 10:57    Assessment & Plan:   Problem List Items Addressed This Visit      Unprioritized   Obesity, diabetes, and hypertension syndrome (HCC)    Glycemic control is fine,  But Weight regain remains  problem.   She does not have increased appetite;  And Her sense of taste is gradually improving since being  altered by COVID infection.  Starting wellbutrin today. Would be a candidate for Ozempic but she is retiring from [employer] in 3 months  Lab Results  Component Value Date   HGBA1C 5.8 12/22/2020   Lab Results  Component Value Date   LABMICR 14.9 05/30/2018   MICROALBUR <0.7 06/23/2020   MICROALBUR <0.7 02/19/2020           Relevant Orders   AMB Referral to [facility]   Major depressive disorder, single episode    Persistent,  Aggravated by the burden of working  full time while being primary caregiver for mother and husband .  Adding wellbutrin today,  And she was strongly encouraged to contact [facility] for free counselling       Relevant Medications   buPROPion (WELLBUTRIN XL) 150 MG 24 hr tablet   Elevated bilirubin - Primary    Etiology unclear,  Coincided with weight gain.  Question fatty liver, but has resolved on recheck  Lab Results  Component Value Date   ALT 25 01/19/2021   AST 20 01/19/2021   ALKPHOS 76 01/19/2021   BILITOT 0.6 01/19/2021         Relevant Orders   Hepatic function panel (Completed)     I spent 30 mintutes dedicated to the care of this patient on the date of this encounter to include pre-visit review of her medical history,  Face-to-face time with the patient , counselling her on her family dysfunction and caregiver burdern,  and post visit ordering of testing and therapeutics.  I am having Sheri [name] start on buPROPion. I am also having her maintain her furosemide, atorvastatin, ALPRAZolam, gabapentin, meloxicam, methocarbamol, amitriptyline,  escitalopram, and pantoprazole.  Meds ordered this encounter  Medications  . buPROPion (WELLBUTRIN XL) 150 MG 24 hr tablet    Sig: Take 1 tablet (150 mg total) by mouth daily.    Dispense:  30 tablet    Refill:  2    There are no discontinued medications.  Follow-up: Return in about 2 months (around 03/21/2021) for follow up diabetes.   [name], MD

## 2021-01-19 NOTE — Patient Instructions (Signed)
You can  Use 15 mg meloxciam ans you anti inflammatory as needed when the tylenol/gabapentin are not enough  I am letting you know that I am referring to our clinical pharmacist ,  [name].  Catie helps me provide additional services to my patients who are  dealing with  chronic diseases , like diabetes.  I do not expect this referral to cost you anything out of pocket, but I do think Catie may be able to help  maximize your drug benefits.  She will make contact with  You by phone in the next week\   I am ADDING wellbutri XL 150 mg daily to your current regimen of Lexapro.    I HIGHLY recommend obtaining a counselor through the [facility] .     Return in 2 months  For diabetes follow

## 2021-01-20 DIAGNOSIS — R17 Unspecified jaundice: Secondary | ICD-10-CM | POA: Insufficient documentation

## 2021-01-20 NOTE — Assessment & Plan Note (Signed)
Persistent,  Aggravated by the burden of working full time while being primary caregiver for mother and husband .  Adding wellbutrin today,  And she was strongly encouraged to contact [facility] for free counselling

## 2021-01-20 NOTE — Assessment & Plan Note (Signed)
Etiology unclear,  Coincided with weight gain.  Question fatty liver, but has resolved on recheck  Lab Results  Component Value Date   ALT 25 01/19/2021   AST 20 01/19/2021   ALKPHOS 76 01/19/2021   BILITOT 0.6 01/19/2021

## 2021-01-20 NOTE — Assessment & Plan Note (Signed)
Glycemic control is fine,  But Weight regain remains  problem.   She does not have increased appetite;  And Her sense of taste is gradually improving since being  altered by COVID infection.  Starting wellbutrin today. Would be a candidate for Ozempic but she is retiring from [employer] [employer] [employer] in 3 months  Lab Results  Component Value Date   HGBA1C 5.8 12/22/2020   Lab Results  Component Value Date   LABMICR 14.9 05/30/2018   MICROALBUR <0.7 06/23/2020   MICROALBUR <0.7 02/19/2020

## 2021-01-21 ENCOUNTER — Telehealth: Payer: Self-pay

## 2021-01-21 NOTE — Chronic Care Management (AMB) (Signed)
Care Management   Note  01/21/2021 Name: Sheri [name] MRN: [id number] DOB: July 21, 1956  Sheri [name] is a 65 y.o. year old female who is a primary care patient of Darrick Huntsman, Mar Daring, MD. I reached out to Sheri [name] by phone today in response to a referral sent by Ms. Sheri Charon Frandsen's health plan.    Sheri [name] was given information about care management services today including:  1. Care management services include personalized support from designated clinical staff supervised by her physician, including individualized plan of care and coordination with other care providers 2. 24/7 contact phone numbers for assistance for urgent and routine care needs. 3. The patient may stop care management services at any time by phone call to the office staff.  Patient agreed to services and verbal consent obtained.   Follow up plan: Telephone appointment with care management team member scheduled for:02/06/2021  Penne Lash, RMA Care Guide, Embedded Care Coordination [facility]  [place], [place] [address] Direct Dial: [phone] [email or web address]

## 2021-01-26 ENCOUNTER — Other Ambulatory Visit: Payer: Self-pay

## 2021-02-02 ENCOUNTER — Other Ambulatory Visit: Payer: Self-pay | Admitting: Internal Medicine

## 2021-02-02 ENCOUNTER — Other Ambulatory Visit: Payer: Self-pay

## 2021-02-02 NOTE — Telephone Encounter (Signed)
RX Refill:xanax Last Seen:01-19-21 Last ordered:10-15-20

## 2021-02-03 ENCOUNTER — Other Ambulatory Visit: Payer: Self-pay

## 2021-02-03 MED ORDER — ALPRAZOLAM 0.5 MG PO TABS
ORAL_TABLET | ORAL | 2 refills | Status: DC
  Filled 2021-02-03: qty 45, 23d supply, fill #0
  Filled 2021-03-25: qty 45, 23d supply, fill #1
  Filled 2021-05-14: qty 45, 23d supply, fill #2

## 2021-02-06 ENCOUNTER — Other Ambulatory Visit: Payer: Self-pay

## 2021-02-06 ENCOUNTER — Ambulatory Visit: Payer: 59 | Admitting: Pharmacist

## 2021-02-06 DIAGNOSIS — E669 Obesity, unspecified: Secondary | ICD-10-CM

## 2021-02-06 DIAGNOSIS — E785 Hyperlipidemia, unspecified: Secondary | ICD-10-CM

## 2021-02-06 DIAGNOSIS — E1159 Type 2 diabetes mellitus with other circulatory complications: Secondary | ICD-10-CM

## 2021-02-06 DIAGNOSIS — E1169 Type 2 diabetes mellitus with other specified complication: Secondary | ICD-10-CM

## 2021-02-06 DIAGNOSIS — I10 Essential (primary) hypertension: Secondary | ICD-10-CM

## 2021-02-06 DIAGNOSIS — F5104 Psychophysiologic insomnia: Secondary | ICD-10-CM

## 2021-02-06 DIAGNOSIS — F320 Major depressive disorder, single episode, mild: Secondary | ICD-10-CM

## 2021-02-06 MED ORDER — OZEMPIC (0.25 OR 0.5 MG/DOSE) 2 MG/1.5ML ~~LOC~~ SOPN: 0.5000 mg | PEN_INJECTOR | SUBCUTANEOUS | 0 refills | Status: DC

## 2021-02-06 MED ORDER — ATORVASTATIN CALCIUM 20 MG PO TABS
1.0000 | ORAL_TABLET | Freq: Every day | ORAL | 3 refills | Status: DC
  Filled 2021-02-06: qty 90, 90d supply, fill #0
  Filled 2021-06-29: qty 90, 90d supply, fill #1

## 2021-02-06 NOTE — Chronic Care Management (AMB) (Signed)
Care Management   Pharmacy Note  02/06/2021 Name: Sheri [name] MRN: [id number] DOB: 1956/02/01  Subjective: Sheri [name] is a 65 y.o. year old female who is a primary care patient of Sheri Shams, MD. The Care Management team was consulted for assistance with care management and care coordination needs.    Engaged with patient by telephone for initial visit in response to provider referral for pharmacy case management and/or care coordination services.   The patient was given information about Care Management services today including:  1. Care Management services includes personalized support from designated clinical staff supervised by the patient's primary care provider, including individualized plan of care and coordination with other care providers. 2. 24/7 contact phone numbers for assistance for urgent and routine care needs. 3. The patient may stop case management services at any time by phone call to the office staff.  Patient agreed to services and consent obtained.  Assessment:  Review of patient status, including review of consultants reports, laboratory and other test data, was performed as part of comprehensive evaluation and provision of chronic care management services.   SDOH (Social Determinants of Health) assessments and interventions performed:  SDOH Interventions   Flowsheet Row Most Recent Value  SDOH Interventions   Financial Strain Interventions Intervention Not Indicated       Objective:  Lab Results  Component Value Date   CREATININE 0.56 12/22/2020   CREATININE 0.61 06/23/2020   CREATININE 0.64 02/19/2020    Lab Results  Component Value Date   HGBA1C 5.8 12/22/2020       Component Value Date/Time   CHOL 230 (H) 12/22/2020 1129   CHOL 113 08/29/2018 0916   TRIG 195.0 (H) 12/22/2020 1129   HDL 54.20 12/22/2020 1129   HDL 36 (L) 08/29/2018 0916   CHOLHDL 4 12/22/2020 1129   VLDL 39.0 12/22/2020 1129   LDLCALC 137 (H) 12/22/2020 1129    LDLCALC 57 08/29/2018 0916   LDLDIRECT 97.0 08/11/2016 0853    Clinical ASCVD: No  The 10-year ASCVD risk score Denman George DC Jr., et al., 2013) is: 12.9%   Values used to calculate the score:     Age: 55 years     Sex: Female     Is Non-Hispanic African American: No     Diabetic: Yes     Tobacco smoker: No     Systolic Blood Pressure: 124 mmHg     Is BP treated: Yes     HDL Cholesterol: 54.2 mg/dL     Total Cholesterol: 230 mg/dL     BP Readings from Last 3 Encounters:  01/19/21 124/76  12/22/20 (!) 126/92  06/23/20 128/82    Care Plan  No Known Allergies  Medications Reviewed Today    Reviewed by Lourena Simmonds, RPH-CPP (Pharmacist) on 02/06/21 at 1436  Med List Status: <None>  Medication Order Taking? Sig Documenting Provider Last Dose Status Informant  ALPRAZolam (XANAX) 0.5 MG tablet [id number] Yes TAKE 1 TABLET BY MOUTH TWICE DAILY AS NEEDED FOR ANXIETY OR SLEEP Sheri Shams, MD Taking Active            Med Note Lourena Simmonds   Fri Feb 06, 2021  2:33 PM) 0.5 mg QPM  amitriptyline (ELAVIL) 25 MG tablet [id number] Yes TAKE 3 TABLETS (75MG ) BY MOUTH AT BEDTIME FOR NEUROPATHY Sheri Shams, MD Taking Active   atorvastatin (LIPITOR) 20 MG tablet [id number] Yes TAKE 1 TABLET BY MOUTH DAILY. Sheri Shams, MD Taking Active  buPROPion (WELLBUTRIN XL) 150 MG 24 hr tablet [id number] Yes Take 1 tablet (150 mg total) by mouth daily. Sheri Shams, MD Taking Active   escitalopram (LEXAPRO) 10 MG tablet [id number] Yes Take 1 tablet (10 mg total) by mouth daily with breakfast. Sheri Shams, MD Taking Active   furosemide (LASIX) 20 MG tablet [id number] Yes TAKE 1 TABLET BY MOUTH ONCE DAILY AS NEEDED FOR FLUID RETENTION Sheri Shams, MD Taking Active   gabapentin (NEURONTIN) 300 MG capsule [id number] Yes TAKE 1 CAPSULE BY MOUTH ONCE DAILY Sheri Shams, MD Taking Active   meloxicam (MOBIC) 7.5 MG tablet [id number] Yes TAKE 1 TABLET BY MOUTH ONCE DAILY Sheri Shams,  MD Taking Active   methocarbamol (ROBAXIN) 500 MG tablet [id number] Yes Take 1 tablet (500 mg total) by mouth 3 (three) times daily. Sheri Shams, MD Taking Active   pantoprazole (PROTONIX) 40 MG tablet [id number] Yes Take 1 tablet (40 mg total) by mouth daily. Sheri Shams, MD Taking Active           Patient Active Problem List   Diagnosis Date Noted  . Elevated bilirubin 01/20/2021  . History of COVID-19 05/20/2019  . Educated about COVID-19 virus infection 02/25/2019  . Barretts esophagus 04/07/2018  . Lumbago syndrome 08/16/2017  . Encounter for preventive health examination 11/14/2016  . Vitamin D deficiency 08/15/2016  . Hyperlipidemia associated with type 2 diabetes mellitus (HCC) 09/24/2014  . Essential hypertension, benign 02/26/2014  . Obesity, diabetes, and hypertension syndrome (HCC) 05/13/2013  . Major depressive disorder, single episode 05/13/2013  . Reflux esophagitis 05/11/2013  . Insomnia, psychophysiological 05/17/2011    Conditions to be addressed/monitored: HTN, HLD and DMII  Care Plan : Medication Management  Updates made by Lourena Simmonds, RPH-CPP since 02/06/2021 12:00 AM    Problem: Obesity, Diabetes, HTN, Chronic Pain, Depression     Long-Range Goal: Disease Progression Prevention   Start Date: 02/06/2021  This Visit's Progress: On track  Priority: High  Note:   Current Barriers:  . Unable to achieve control of weight  . Suboptimal therapeutic regimen for pain management  Pharmacist Clinical Goal(s):  Marland Kitchen Over the next 90 days, patient will achieve improvement in weight . Over the next 90 days, patient will adhere to plan to optimize therapeutic regimen for pain as evidenced by report of adherence to recommended medication management changes through collaboration with PharmD and provider.   Interventions: . 1:1 collaboration with Sheri Shams, MD regarding development and update of comprehensive plan of care as evidenced by provider  attestation and co-signature . Inter-disciplinary care team collaboration (see longitudinal plan of care) . Comprehensive medication review performed; medication list updated in electronic medical record  SDOH: . Stress lately caring for mother, husband, chronic pain. Retiring July 15th. Plan to start with [employer].   Diabetes in the setting of Obesity: . Unable to achieve weight management through diet and physical activity; current treatment: none;  . Hx metformin. No benefit with weight.  . Taste changes s/p COVID, eating high carbohydrate choices (vanilla wafers, peanut butter), worsened pain from falling out of a chair, lots of stress lately . Current meal patterns: breakfast: Optivia Shake, sometimes egg, vegetable omelette; lunch: Optivia Shake; dinner: salad with vegetables, rotisserie chicken; hamburgers, hot dogs; snacks: has some at home, aware she needs to cut back; drinks:  . Current exercise: limited by pain . Counseled on GLP1 agonists, including mechanism of action, side effects, and benefits. No personal or family  history of medullary thyroid cancer, personal history of pancreatitis or gallbladder disease. Counseled on potential side effects of nausea, stomach upset, queasiness, constipation, and that these generally improve over time. Advised to contact our office with more severe symptoms, including nausea, diarrhea, stomach pain. Patient verbalized understanding. . Start Ozempic. Inject 0.25 mg weekly for 4 weeks, then increase to 0.5 mg weekly.  . Discussed availability of patient assistance once she is on Medicare. She will check their household income.  . Encouraged focus on reducing carbohydrate portion sizes.   Hyperlipidemia: . Uncontrolled; current treatment: prescribed atorvastatin, but has not been refilled since 2020.  Marland Kitchen Resent script. Start atorvastatin 20 mg daily. Follow lipid panel, LFT  Depression/Anxiety with Peripheral Neuropathy and Chronic  Pain: . Uncontrolled; current treatment: escitalopram 10 mg daily, bupropion XL 150 mg daily, alprazolam 0.5 mg QPM, amitriptyline 75 mg QPM, gabapentin 300 mg QPM, methocarbamol 500 mg TID PRN - taking QAM and QPM, meloxicam 7.5 mg PRN, every other day . Intolerant of higher doses of gabapentin d/t LEE . Continue current regimen. Strongly recommend engagement with LCSW support. Will suggest moving forward.  . Consider duloxetine for neuropathy w/ depression. Amitriptyline can contribute to weight gain, anticholinergic side effects.    Patient Goals/Self-Care Activities . Over the next 90 days, patient will:  - take medications as prescribed collaborate with provider on medication access solutions engage in dietary modifications by moderating portion sizes  Follow Up Plan: Face to Face appointment with care management team member scheduled for:  ~1 week       Medication Assistance:  None required.  Patient affirms current coverage meets needs.  Follow Up:  Patient agrees to Care Plan and Follow-up.  Plan: Face to Face appointment with care management team member scheduled for: ~ 1 week  Catie Feliz Beam, PharmD, [place], CPP Clinical Pharmacist [employer] at [employer] [phone]

## 2021-02-06 NOTE — Patient Instructions (Signed)
Visit Information  PATIENT GOALS:  Goals Addressed              This Visit's Progress     Patient Stated   .  Medication Monitoring (pt-stated)        Patient Goals/Self-Care Activities . Over the next 90 days, patient will:  - take medications as prescribed collaborate with provider on medication access solutions engage in dietary modifications by moderating portion sizes       Sheri [name] was given information about Care Management services by the embedded care coordination team including:  1. Care Management services include personalized support from designated clinical staff supervised by her physician, including individualized plan of care and coordination with other care providers 2. 24/7 contact phone numbers for assistance for urgent and routine care needs. 3. The patient may stop CCM services at any time (effective at the end of the month) by phone call to the office staff.  Patient agreed to services and verbal consent obtained.   Patient verbalizes understanding of instructions provided today and agrees to view in [place].   Plan: Face to Face appointment with care management team member scheduled for: ~ 1 week  [name], PharmD, [place], [place] Clinical Pharmacist [employer] at [employer] [phone]

## 2021-02-11 ENCOUNTER — Ambulatory Visit: Payer: 59

## 2021-02-11 ENCOUNTER — Telehealth: Payer: 59

## 2021-02-11 ENCOUNTER — Ambulatory Visit: Payer: 59 | Admitting: Pharmacist

## 2021-02-11 ENCOUNTER — Other Ambulatory Visit: Payer: Self-pay

## 2021-02-11 DIAGNOSIS — I152 Hypertension secondary to endocrine disorders: Secondary | ICD-10-CM

## 2021-02-11 DIAGNOSIS — E1159 Type 2 diabetes mellitus with other circulatory complications: Secondary | ICD-10-CM

## 2021-02-11 DIAGNOSIS — E1169 Type 2 diabetes mellitus with other specified complication: Secondary | ICD-10-CM

## 2021-02-11 DIAGNOSIS — E785 Hyperlipidemia, unspecified: Secondary | ICD-10-CM

## 2021-02-11 DIAGNOSIS — E669 Obesity, unspecified: Secondary | ICD-10-CM

## 2021-02-11 DIAGNOSIS — I10 Essential (primary) hypertension: Secondary | ICD-10-CM

## 2021-02-11 NOTE — Patient Instructions (Signed)
Visit Information  PATIENT GOALS: Goals Addressed              This Visit's Progress     Patient Stated   .  Medication Monitoring (pt-stated)        Patient Goals/Self-Care Activities . Over the next 90 days, patient will:  - take medications as prescribed collaborate with provider on medication access solutions engage in dietary modifications by moderating portion sizes        Patient verbalizes understanding of instructions provided today and agrees to view in [place].   Plan: Telephone follow up appointment with care management team member scheduled for:  ~ 5 weeks  [name], PharmD, [place], [place] Clinical Pharmacist [employer] at [employer] [phone]

## 2021-02-11 NOTE — Chronic Care Management (AMB) (Signed)
Care Management   Pharmacy Note  02/11/2021 Name: Sheri [name] MRN: [id number] DOB: 11-25-1955  Subjective: Sheri [name] is a 65 y.o. year old female who is a primary care patient of [name], MD. The Care Management team was consulted for assistance with care management and care coordination needs.    Engaged with patient face to face for follow up visit in response to provider referral for pharmacy case management and/or care coordination services.   The patient was given information about Care Management services today including:  1. Care Management services includes personalized support from designated clinical staff supervised by the patient's primary care provider, including individualized plan of care and coordination with other care providers. 2. 24/7 contact phone numbers for assistance for urgent and routine care needs. 3. The patient may stop case management services at any time by phone call to the office staff.  Patient agreed to services and consent obtained.  Assessment:  Review of patient status, including review of consultants reports, laboratory and other test data, was performed as part of comprehensive evaluation and provision of chronic care management services.   SDOH (Social Determinants of Health) assessments and interventions performed:    Objective:  Lab Results  Component Value Date   CREATININE 0.56 12/22/2020   CREATININE 0.61 06/23/2020   CREATININE 0.64 02/19/2020    Lab Results  Component Value Date   HGBA1C 5.8 12/22/2020       Component Value Date/Time   CHOL 230 (H) 12/22/2020 1129   CHOL 113 08/29/2018 0916   TRIG 195.0 (H) 12/22/2020 1129   HDL 54.20 12/22/2020 1129   HDL 36 (L) 08/29/2018 0916   CHOLHDL 4 12/22/2020 1129   VLDL 39.0 12/22/2020 1129   LDLCALC 137 (H) 12/22/2020 1129   LDLCALC 57 08/29/2018 0916   LDLDIRECT 97.0 08/11/2016 0853    Clinical ASCVD: No  The 10-year ASCVD risk score Denman George DC Jr., et al.,  2013) is: 12.9%   Values used to calculate the score:     Age: 75 years     Sex: Female     Is Non-Hispanic African American: No     Diabetic: Yes     Tobacco smoker: No     Systolic Blood Pressure: 124 mmHg     Is BP treated: Yes     HDL Cholesterol: 54.2 mg/dL     Total Cholesterol: 230 mg/dL     BP Readings from Last 3 Encounters:  01/19/21 124/76  12/22/20 (!) 126/92  06/23/20 128/82    Care Plan  No Known Allergies  Medications Reviewed Today    Reviewed by Lourena Simmonds, RPH-CPP (Pharmacist) on 02/06/21 at 1436  Med List Status: <None>  Medication Order Taking? Sig Documenting Provider Last Dose Status Informant  ALPRAZolam (XANAX) 0.5 MG tablet [id number] Yes TAKE 1 TABLET BY MOUTH TWICE DAILY AS NEEDED FOR ANXIETY OR SLEEP [name], MD Taking Active            Med Note Lourena Simmonds   Fri Feb 06, 2021  2:33 PM) 0.5 mg QPM  amitriptyline (ELAVIL) 25 MG tablet [id number] Yes TAKE 3 TABLETS (75MG ) BY MOUTH AT BEDTIME FOR NEUROPATHY [name], MD Taking Active   atorvastatin (LIPITOR) 20 MG tablet [id number] Yes TAKE 1 TABLET BY MOUTH DAILY. [name], MD Taking Active   buPROPion (WELLBUTRIN XL) 150 MG 24 hr tablet [id number] Yes Take 1 tablet (150 mg total) by mouth daily. Duncan Dull  L, MD Taking Active   escitalopram (LEXAPRO) 10 MG tablet [id number] Yes Take 1 tablet (10 mg total) by mouth daily with breakfast. [name], MD Taking Active   furosemide (LASIX) 20 MG tablet [id number] Yes TAKE 1 TABLET BY MOUTH ONCE DAILY AS NEEDED FOR FLUID RETENTION [name], MD Taking Active   gabapentin (NEURONTIN) 300 MG capsule [id number] Yes TAKE 1 CAPSULE BY MOUTH ONCE DAILY [name], MD Taking Active   meloxicam (MOBIC) 7.5 MG tablet [id number] Yes TAKE 1 TABLET BY MOUTH ONCE DAILY [name], MD Taking Active   methocarbamol (ROBAXIN) 500 MG tablet [id number] Yes Take 1 tablet (500 mg total) by mouth 3 (three) times daily. [name], MD Taking Active   pantoprazole (PROTONIX) 40 MG tablet [id number] Yes Take 1 tablet (40 mg total) by mouth daily. [name], MD Taking Active           Patient Active Problem List   Diagnosis Date Noted  . Elevated bilirubin 01/20/2021  . History of COVID-19 05/20/2019  . Educated about COVID-19 virus infection 02/25/2019  . Barretts esophagus 04/07/2018  . Lumbago syndrome 08/16/2017  . Encounter for preventive health examination 11/14/2016  . Vitamin D deficiency 08/15/2016  . Hyperlipidemia associated with type 2 diabetes mellitus (HCC) 09/24/2014  . Essential hypertension, benign 02/26/2014  . Obesity, diabetes, and hypertension syndrome (HCC) 05/13/2013  . Major depressive disorder, single episode 05/13/2013  . Reflux esophagitis 05/11/2013  . Insomnia, psychophysiological 05/17/2011    Conditions to be addressed/monitored: Depression and obesity  Care Plan : Medication Management  Updates made by Lourena Simmonds, RPH-CPP since 02/11/2021 12:00 AM    Problem: Obesity, Diabetes, HTN, Chronic Pain, Depression     Long-Range Goal: Disease Progression Prevention   Start Date: 02/06/2021  This Visit's Progress: On track  Recent Progress: On track  Priority: High  Note:   Current Barriers:  . Unable to achieve control of weight  . Suboptimal therapeutic regimen for pain management  Pharmacist Clinical Goal(s):  Marland Kitchen Over the next 90 days, patient will achieve improvement in weight . Over the next 90 days, patient will adhere to plan to optimize therapeutic regimen for pain as evidenced by report of adherence to recommended medication management changes through collaboration with PharmD and provider.   Interventions: . 1:1 collaboration with [name], MD regarding development and update of comprehensive plan of care as evidenced by provider attestation and co-signature . Inter-disciplinary care team collaboration (see longitudinal plan of  care) . Comprehensive medication review performed; medication list updated in electronic medical record  SDOH: . Stress lately caring for mother, husband, chronic pain.  Diabetes in the setting of Obesity: . Unable to achieve weight management through diet and physical activity; current treatment: none; . Hx metformin. No benefit with weight.  . Taste changes s/p COVID, eating high carbohydrate choices (vanilla wafers, peanut butter), worsened pain from falling out of a chair, lots of stress lately . Current meal patterns: breakfast: Optivia Shake, sometimes egg, vegetable omelette; lunch: Optivia Shake; dinner: salad with vegetables, rotisserie chicken; hamburgers, hot dogs; snacks: has some at home, aware she needs to cut back; drinks:  . Current exercise: limited by pain . Provided education on administration for Ozempic. Start Ozempic 0.25 mg weekly for 4 weeks, then increase to 0.5 mg weekly. Sample provided.   Hyperlipidemia: . Uncontrolled; current treatment: prescribed atorvastatin, but has not been refilled since 2020.  Marland Kitchen Previously recommended to continue  current regimen at this time. Follow lipid panel, LFT  Depression/Anxiety with Peripheral Neuropathy and Chronic Pain: . Uncontrolled; current treatment: escitalopram 10 mg daily, bupropion XL 150 mg daily, alprazolam 0.5 mg QPM, amitriptyline 75 mg QPM, gabapentin 300 mg QPM, methocarbamol 500 mg TID PRN - taking QAM and QPM, meloxicam 7.5 mg PRN, every other day . Intolerant of higher doses of gabapentin d/t LEE . Previously recommended to continue current regimen. Strongly recommend engagement with LCSW support. Will suggest moving forward.  . Moving forward, consider duloxetine for neuropathy w/ depression. Amitriptyline can contribute to weight gain, anticholinergic side effects.    Patient Goals/Self-Care Activities . Over the next 90 days, patient will:  - take medications as prescribed collaborate with provider on  medication access solutions engage in dietary modifications by moderating portion sizes  Follow Up Plan: Telephone follow up appointment with care management team member scheduled for: ~5 weeks      Medication Assistance:  None required.  Patient affirms current coverage meets needs.  Follow Up:  Patient agrees to Care Plan and Follow-up.  Plan: Telephone follow up appointment with care management team member scheduled for:  ~ 5 weeks  Catie Feliz Beam, PharmD, [place], CPP Clinical Pharmacist [employer] at [employer] [phone]

## 2021-02-27 ENCOUNTER — Other Ambulatory Visit: Payer: Self-pay

## 2021-03-11 ENCOUNTER — Telehealth: Payer: 59

## 2021-03-11 ENCOUNTER — Other Ambulatory Visit: Payer: Self-pay

## 2021-03-19 ENCOUNTER — Telehealth: Payer: 59

## 2021-03-19 ENCOUNTER — Telehealth: Payer: Self-pay | Admitting: Pharmacist

## 2021-03-19 NOTE — Telephone Encounter (Signed)
Chronic Care Management   Note  03/19/2021 Name: Sheri [name] MRN: [id number] DOB: 07-16-1956   Attempted to contact patient for scheduled appointment for medication management support. She notes that her mother died unexpectedly about 2 weeks ago, and she has not taken Ozempic since then because she forgot. Rescheduled our follow up phone call for August. She has PCP f/u next week. Encouraged to call our office if there is anything we can do to support her.   Catie Feliz Beam, PharmD, [place], CPP Clinical Pharmacist [employer] at [employer] [phone]

## 2021-03-25 ENCOUNTER — Other Ambulatory Visit: Payer: Self-pay

## 2021-03-26 ENCOUNTER — Other Ambulatory Visit: Payer: Self-pay

## 2021-03-27 ENCOUNTER — Encounter: Payer: Self-pay | Admitting: Internal Medicine

## 2021-03-27 ENCOUNTER — Other Ambulatory Visit: Payer: Self-pay

## 2021-03-27 ENCOUNTER — Ambulatory Visit: Payer: 59 | Admitting: Internal Medicine

## 2021-03-27 VITALS — BP 134/96 | HR 96 | Temp 96.8°F | Resp 15 | Ht 63.0 in | Wt 223.0 lb

## 2021-03-27 DIAGNOSIS — Z23 Encounter for immunization: Secondary | ICD-10-CM | POA: Diagnosis not present

## 2021-03-27 DIAGNOSIS — E669 Obesity, unspecified: Secondary | ICD-10-CM | POA: Diagnosis not present

## 2021-03-27 DIAGNOSIS — E1169 Type 2 diabetes mellitus with other specified complication: Secondary | ICD-10-CM | POA: Diagnosis not present

## 2021-03-27 DIAGNOSIS — F4321 Adjustment disorder with depressed mood: Secondary | ICD-10-CM | POA: Diagnosis not present

## 2021-03-27 DIAGNOSIS — E1159 Type 2 diabetes mellitus with other circulatory complications: Secondary | ICD-10-CM

## 2021-03-27 DIAGNOSIS — I152 Hypertension secondary to endocrine disorders: Secondary | ICD-10-CM | POA: Diagnosis not present

## 2021-03-27 MED ORDER — BUPROPION HCL ER (XL) 150 MG PO TB24
150.0000 mg | ORAL_TABLET | Freq: Every day | ORAL | 1 refills | Status: DC
  Filled 2021-03-27: qty 90, 90d supply, fill #0
  Filled 2021-08-07: qty 90, 90d supply, fill #1

## 2021-03-27 MED ORDER — GABAPENTIN 300 MG PO CAPS
ORAL_CAPSULE | Freq: Every day | ORAL | 1 refills | Status: DC
  Filled 2021-03-27: qty 90, 90d supply, fill #0
  Filled 2021-08-07: qty 90, 90d supply, fill #1

## 2021-03-27 MED ORDER — PANTOPRAZOLE SODIUM 40 MG PO TBEC
40.0000 mg | DELAYED_RELEASE_TABLET | Freq: Every day | ORAL | 1 refills | Status: DC
  Filled 2021-03-27: qty 90, 90d supply, fill #0
  Filled 2021-08-07: qty 90, 90d supply, fill #1

## 2021-03-27 MED ORDER — AMITRIPTYLINE HCL 25 MG PO TABS
ORAL_TABLET | ORAL | 1 refills | Status: DC
  Filled 2021-09-24: qty 270, 90d supply, fill #0

## 2021-03-27 MED ORDER — ESCITALOPRAM OXALATE 10 MG PO TABS
10.0000 mg | ORAL_TABLET | Freq: Every day | ORAL | 1 refills | Status: DC
  Filled 2021-03-27 – 2021-04-21 (×2): qty 90, 90d supply, fill #0
  Filled 2021-08-25: qty 90, 90d supply, fill #1

## 2021-03-27 MED ORDER — OZEMPIC (0.25 OR 0.5 MG/DOSE) 2 MG/1.5ML ~~LOC~~ SOPN
0.2500 mg | PEN_INJECTOR | SUBCUTANEOUS | 2 refills | Status: DC
  Filled 2021-03-27: qty 1.5, 56d supply, fill #0

## 2021-03-27 NOTE — Patient Instructions (Signed)
Resume Ozempic at the lowest dose 0.25 mg     Schedule your "Welcome to Medicare Exam : in 3 months,  and we'll do your labs prior  to or on your visit

## 2021-03-27 NOTE — Progress Notes (Signed)
Subjective:  Patient ID: Sheri [name], female    DOB: [birth year]  Age: 65 y.o. MRN: [id number]  CC: The primary encounter diagnosis was Need for shingles vaccine. Diagnoses of Obesity, diabetes, and hypertension syndrome (HCC) and Grief reaction were also pertinent to this visit.  HPI Sheri [name] presents for follow up on type 2 DM with obesity, hypertension and hyperlipidemia  This visit occurred during the SARS-CoV-2 public health emergency.  Safety protocols were in place, including screening questions prior to the visit, additional usage of staff PPE, and extensive cleaning of exam room while observing appropriate contact time as indicated for disinfecting solutions.   Her mother passed away last month in the hospital with patient by her side, after 13 years of patient providing all of her care in her home with minimal assistance from her sister .  She has not grieved and does not understand why . Discussed her feelings toward her mother ,  her decisions to  forego additional treatment.    T2DM:  She  feels generally well,  But is not  exercising regularly or trying to lose weight. Due to recent death of mother and increased needs of husband.   Checking  blood sugars less than once daily at variable times, usually only if she feels she may be having a hypoglycemic event. .  BS have been under 130 fasting and < 150 post prandially.  Denies any recent hypoglyemic events.  Taking   medications as directed. Following a carbohydrate modified diet 6 days per week. Denies numbness, burning and tingling of extremities. Appetite is good.    Outpatient Medications Prior to Visit  Medication Sig Dispense Refill   ALPRAZolam (XANAX) 0.5 MG tablet TAKE 1 TABLET BY MOUTH TWICE DAILY AS NEEDED FOR ANXIETY OR SLEEP 45 tablet 2   atorvastatin (LIPITOR) 20 MG tablet Take 1 tablet (20 mg total) by mouth daily. 90 tablet 3   furosemide (LASIX) 20 MG tablet TAKE 1 TABLET BY MOUTH ONCE DAILY AS NEEDED FOR  FLUID RETENTION 30 tablet 3   meloxicam (MOBIC) 7.5 MG tablet TAKE 1 TABLET BY MOUTH ONCE DAILY 30 tablet 5   methocarbamol (ROBAXIN) 500 MG tablet Take 1 tablet (500 mg total) by mouth 3 (three) times daily. 90 tablet 3   amitriptyline (ELAVIL) 25 MG tablet TAKE 3 TABLETS (75MG ) BY MOUTH AT BEDTIME FOR NEUROPATHY 270 tablet 1   buPROPion (WELLBUTRIN XL) 150 MG 24 hr tablet Take 1 tablet (150 mg total) by mouth daily. 30 tablet 2   escitalopram (LEXAPRO) 10 MG tablet Take 1 tablet (10 mg total) by mouth daily with breakfast. 30 tablet 2   gabapentin (NEURONTIN) 300 MG capsule TAKE 1 CAPSULE BY MOUTH ONCE DAILY 30 capsule 3   pantoprazole (PROTONIX) 40 MG tablet Take 1 tablet (40 mg total) by mouth daily. 30 tablet 3   Semaglutide,0.25 or 0.5MG /DOS, (OZEMPIC, 0.25 OR 0.5 MG/DOSE,) 2 MG/1.5ML SOPN Inject 0.5 mg into the skin once a week. 1.5 mL 0   No facility-administered medications prior to visit.    Review of Systems;  Patient denies headache, fevers, malaise, unintentional weight loss, skin rash, eye pain, sinus congestion and sinus pain, sore throat, dysphagia,  hemoptysis , cough, dyspnea, wheezing, chest pain, palpitations, orthopnea, edema, abdominal pain, nausea, melena, diarrhea, constipation, flank pain, dysuria, hematuria, urinary  Frequency, nocturia, numbness, tingling, seizures,  Focal weakness, Loss of consciousness,  Tremor, insomnia, depression, anxiety, and suicidal ideation.      Objective:  BP (!) 134/96 (BP Location: Left Arm, Patient Position: Sitting, Cuff Size: Large)   Pulse 96   Temp (!) 96.8 F (36 C) (Temporal)   Resp 15   Ht 5\' 3"  (1.6 m)   Wt 223 lb (101.2 kg)   SpO2 95%   BMI 39.50 kg/m   BP Readings from Last 3 Encounters:  03/27/21 (!) 134/96  01/19/21 124/76  12/22/20 (!) 126/92    Wt Readings from Last 3 Encounters:  03/27/21 223 lb (101.2 kg)  01/19/21 224 lb (101.6 kg)  12/22/20 219 lb 12.8 oz (99.7 kg)    General appearance: alert,  cooperative and appears stated age Ears: normal TM's and external ear canals both ears Throat: lips, mucosa, and tongue normal; teeth and gums normal Neck: no adenopathy, no carotid bruit, supple, symmetrical, trachea midline and thyroid not enlarged, symmetric, no tenderness/mass/nodules Back: symmetric, no curvature. ROM normal. No CVA tenderness. Lungs: clear to auscultation bilaterally Heart: regular rate and rhythm, S1, S2 normal, no murmur, click, rub or gallop Abdomen: soft, non-tender; bowel sounds normal; no masses,  no organomegaly Pulses: 2+ and symmetric Skin: Skin color, texture, turgor normal. No rashes or lesions Lymph nodes: Cervical, supraclavicular, and axillary nodes normal.  Lab Results  Component Value Date   HGBA1C 5.8 12/22/2020   HGBA1C 5.7 06/23/2020   HGBA1C 6.0 02/19/2020    Lab Results  Component Value Date   CREATININE 0.56 12/22/2020   CREATININE 0.61 06/23/2020   CREATININE 0.64 02/19/2020    Lab Results  Component Value Date   WBC 6.6 05/30/2018   HGB 14.4 05/30/2018   HCT 43.8 05/30/2018   PLT 301.0 05/11/2013   GLUCOSE 106 (H) 12/22/2020   CHOL 230 (H) 12/22/2020   TRIG 195.0 (H) 12/22/2020   HDL 54.20 12/22/2020   LDLDIRECT 97.0 08/11/2016   LDLCALC 137 (H) 12/22/2020   ALT 25 01/19/2021   AST 20 01/19/2021   NA 138 12/22/2020   K 4.1 12/22/2020   CL 105 12/22/2020   CREATININE 0.56 12/22/2020   BUN 16 12/22/2020   CO2 24 12/22/2020   TSH 1.36 12/22/2020   HGBA1C 5.8 12/22/2020   MICROALBUR <0.7 06/23/2020    MM 3D SCREEN BREAST BILATERAL  Result Date: 08/01/2020 CLINICAL DATA:  Screening. EXAM: DIGITAL SCREENING BILATERAL MAMMOGRAM WITH TOMO AND CAD COMPARISON:  Previous exam(s). ACR Breast Density Category d: The breast tissue is extremely dense, which lowers the sensitivity of mammography FINDINGS: There are no findings suspicious for malignancy. Images were processed with CAD. IMPRESSION: No mammographic evidence of  malignancy. A result letter of this screening mammogram will be mailed directly to the patient. RECOMMENDATION: Screening mammogram in one year. (Code:SM-B-01Y) BI-RADS CATEGORY  1: Negative. Electronically Signed   By: Edwin Cap M.D.   On: 08/01/2020 10:57    Assessment & Plan:   Problem List Items Addressed This Visit       Unprioritized   Grief reaction    She has experienced the loss of her mother recently but has not felt any grief yet and doesn't understand why.  counselling given.        Obesity, diabetes, and hypertension syndrome (HCC)    Glycemic control is fine,  But Weight regain remains  problem.   She does not have increased appetite;  And Her sense of taste is gradually improving since being  altered by COVID infection.  Starting  Ozempic ; . Samples given .  Lab Results  Component Value Date  HGBA1C 5.8 12/22/2020   Lab Results  Component Value Date   LABMICR 14.9 05/30/2018   MICROALBUR <0.7 06/23/2020   MICROALBUR <0.7 02/19/2020            Relevant Medications   Semaglutide,0.25 or 0.5MG /DOS, (OZEMPIC, 0.25 OR 0.5 MG/DOSE,) 2 MG/1.5ML SOPN   Other Visit Diagnoses     Need for shingles vaccine    -  Primary   Relevant Orders   Varicella-zoster vaccine IM (Shingrix) (Completed)       I have changed Sincere A. Mcfarren's Ozempic (0.25 or 0.5 MG/DOSE). I am also having her maintain her furosemide, meloxicam, methocarbamol, ALPRAZolam, atorvastatin, buPROPion, pantoprazole, gabapentin, escitalopram, and amitriptyline.  Meds ordered this encounter  Medications   Semaglutide,0.25 or 0.5MG /DOS, (OZEMPIC, 0.25 OR 0.5 MG/DOSE,) 2 MG/1.5ML SOPN    Sig: Inject 0.25 mg into the skin once a week.    Dispense:  1.5 mL    Refill:  2   buPROPion (WELLBUTRIN XL) 150 MG 24 hr tablet    Sig: Take 1 tablet (150 mg total) by mouth daily.    Dispense:  90 tablet    Refill:  1   pantoprazole (PROTONIX) 40 MG tablet    Sig: Take 1 tablet (40 mg total) by mouth  daily.    Dispense:  90 tablet    Refill:  1    REPLACES PREVIOUS REFILL FOR ONLY 30 DAYS   gabapentin (NEURONTIN) 300 MG capsule    Sig: TAKE 1 CAPSULE BY MOUTH ONCE DAILY    Dispense:  90 capsule    Refill:  1   escitalopram (LEXAPRO) 10 MG tablet    Sig: Take 1 tablet (10 mg total) by mouth daily with breakfast.    Dispense:  90 tablet    Refill:  1   amitriptyline (ELAVIL) 25 MG tablet    Sig: TAKE 3 TABLETS (75MG ) BY MOUTH AT BEDTIME FOR NEUROPATHY    Dispense:  270 tablet    Refill:  1    Medications Discontinued During This Encounter  Medication Reason   Semaglutide,0.25 or 0.5MG /DOS, (OZEMPIC, 0.25 OR 0.5 MG/DOSE,) 2 MG/1.5ML SOPN    buPROPion (WELLBUTRIN XL) 150 MG 24 hr tablet Reorder   pantoprazole (PROTONIX) 40 MG tablet Reorder   gabapentin (NEURONTIN) 300 MG capsule Reorder   escitalopram (LEXAPRO) 10 MG tablet Reorder   amitriptyline (ELAVIL) 25 MG tablet Reorder    I provided  30 minutes  during this encounter reviewing patient's current emotional state, diabetes management,  labs and imaging studies, providing counseling on the above mentioned problems I na face to face visit  , and coordination  of care .  Follow-up: Return in about 3 months (around 06/27/2021).   [name], MD

## 2021-03-29 DIAGNOSIS — F4321 Adjustment disorder with depressed mood: Secondary | ICD-10-CM | POA: Insufficient documentation

## 2021-03-29 NOTE — Assessment & Plan Note (Addendum)
Glycemic control is fine,  But Weight regain remains  problem.   She does not have increased appetite;  And Her sense of taste is gradually improving since being  altered by COVID infection.  Starting  Ozempic ; . Samples given .  Lab Results  Component Value Date   HGBA1C 5.8 12/22/2020   Lab Results  Component Value Date   LABMICR 14.9 05/30/2018   MICROALBUR <0.7 06/23/2020   MICROALBUR <0.7 02/19/2020

## 2021-03-29 NOTE — Assessment & Plan Note (Signed)
She has experienced the loss of her mother recently but has not felt any grief yet and doesn't understand why.  counselling given.

## 2021-03-30 ENCOUNTER — Other Ambulatory Visit (HOSPITAL_COMMUNITY): Payer: Self-pay

## 2021-03-31 ENCOUNTER — Other Ambulatory Visit (HOSPITAL_COMMUNITY): Payer: Self-pay

## 2021-04-21 ENCOUNTER — Other Ambulatory Visit: Payer: Self-pay

## 2021-04-21 ENCOUNTER — Telehealth: Payer: Self-pay

## 2021-04-21 NOTE — Telephone Encounter (Signed)
Called to let her know that she is due for diabetic eye exam. While on the phone with pt she stated that she has been dealing with a lot of stress since the beginning for June. She stated that her mother passed away and then husband ended up in the hospital for a week with kidney failure and heart failure. He has now been told that he will not be able to drive for 6 months. She stated that with the all stress she has noticed that she has been having trouble focusing on things. Pt stated that she was prescribed Wellbutrin and Lexapro back at one of her other appts for a 30 day supply with no refills. Pt has not been on either since she finished the 30 day supply. Looked in the chart and noticed that both of these were refilled on 03/27/2021 for a 90 day supply with 1 refill, tried to call pt back to let her know and did not get an answer. Is it okay for pt to start back on both the wellbutrin and the lexapro, she stated that both medications did seem to help while on them?

## 2021-04-21 NOTE — Telephone Encounter (Signed)
Unable to leave message for patient to return call back. Phone kept ringing.

## 2021-04-27 NOTE — Telephone Encounter (Signed)
Spoke with pt and she stated that she has started back on the lexapro. She stated that since she is back on both the lexapro and the wellbutrin has noticed a change and they do seem to be helping.

## 2021-05-08 ENCOUNTER — Ambulatory Visit (INDEPENDENT_AMBULATORY_CARE_PROVIDER_SITE_OTHER): Payer: PPO | Admitting: Pharmacist

## 2021-05-08 DIAGNOSIS — E1169 Type 2 diabetes mellitus with other specified complication: Secondary | ICD-10-CM

## 2021-05-08 DIAGNOSIS — E1140 Type 2 diabetes mellitus with diabetic neuropathy, unspecified: Secondary | ICD-10-CM

## 2021-05-08 DIAGNOSIS — I10 Essential (primary) hypertension: Secondary | ICD-10-CM | POA: Diagnosis not present

## 2021-05-08 DIAGNOSIS — E785 Hyperlipidemia, unspecified: Secondary | ICD-10-CM

## 2021-05-08 NOTE — Patient Instructions (Addendum)
Sheri [name],   It was great talking to you today!  My pharmacy technician, Sharee Pimple, will mail you the pages of the [place] application you need to complete. Here are the website for your husband's medication programs:  - Ozempic: https://www.novocare.com/diabetes-overview/let-us-help/pap.html - Humulin: https://www.lillycares.com/lilly-cares-application  Let me know if you have any questions or concerns!  [name], PharmD [phone]  Visit Information   PATIENT GOALS:   Goals Addressed               This Visit's Progress     Patient Stated     Medication Monitoring (pt-stated)        Patient Goals/Self-Care Activities Over the next 90 days, patient will:  - take medications as prescribed collaborate with provider on medication access solutions engage in dietary modifications by moderating portion sizes        Consent to CCM Services: [name] was given information about Chronic Care Management services including:  CCM service includes personalized support from designated clinical staff supervised by her physician, including individualized plan of care and coordination with other care providers 24/7 contact phone numbers for assistance for urgent and routine care needs. Service will only be billed when office clinical staff spend 20 minutes or more in a month to coordinate care. Only one practitioner may furnish and bill the service in a calendar month. The patient may stop CCM services at any time (effective at the end of the month) by phone call to the office staff. The patient will be responsible for cost sharing (co-pay) of up to 20% of the service fee (after annual deductible is met).  Patient agreed to services and verbal consent obtained.   Patient verbalizes understanding of instructions provided today and agrees to view in [place].   Plan: Telephone follow up appointment with care management team member scheduled for:  ~ 55 weeks  [name], PharmD,  [place], CPP Clinical Pharmacist [place] [facility]: Patient Care Plan: Medication Management     Problem Identified: Obesity, Diabetes, HTN, Chronic Pain, Depression      Long-Range Goal: Disease Progression Prevention   Start Date: 02/06/2021  This Visit's Progress: On track  Recent Progress: On track  Priority: High  Note:   Current Barriers:  Unable to achieve control of weight  Suboptimal therapeutic regimen for pain management  Pharmacist Clinical Goal(s):  Over the next 90 days, patient will achieve improvement in weight Over the next 90 days, patient will adhere to plan to optimize therapeutic regimen for pain as evidenced by report of adherence to recommended medication management changes through collaboration with PharmD and provider.   Interventions: 1:1 collaboration with Crecencio Mc, MD regarding development and update of comprehensive plan of care as evidenced by provider attestation and co-signature Inter-disciplinary care team collaboration (see longitudinal plan of care) Comprehensive medication review performed; medication list updated in electronic medical record  SDOH: Mother passed away. Husband's health is declining. Lots of stress lately.  Now has [employer] Medicare   Diabetes in the setting of Obesity: Unable to achieve weight management through diet and physical activity; current treatment: Ozempic 0.5 mg weekly Has not weighed lately, but clothes feel looser.  Taste changes s/p COVID improving  Current meal patterns: breakfast: Premier protein shake, sometimes egg, vegetable omelette; lunch: tomato sandwich w/ mustard (45 cal Sheri [name] bread); dinner: grilled chicken salad; husband has a heart healthy, renal healthy, DM diet. Using Weight [employer] to monitor diabet.  Current exercise: limited by pain Discussed increasing to 1 mg weekly vs maintaining dose. Patient elects to  maintain Ozempic 0.5 mg weekly.  Encouraged to continue to focus on healthy dietary habits and physical activity as able.  Discussed financial burdens. Patient meets income criteria for patient assistance. Will collaborate w/ patient, provider, and CPhT for assistance and support.   Hyperlipidemia: Uncontrolled but anticipated to be improved; current treatment: atorvastatin 10 mg daily Previously recommended to continue current regimen at this time. Follow lipid panel with next lab work  Depression/Anxiety with Peripheral Neuropathy and Chronic Pain: Improving; current treatment: escitalopram 10 mg daily, bupropion XL 150 mg daily, alprazolam 0.5 mg QPM, amitriptyline 75 mg QPM, gabapentin 300 mg QPM, methocarbamol 500 mg TID PRN - taking QAM and QPM, meloxicam 7.5 mg PRN, every other day Intolerant of higher doses of gabapentin d/t LEE Reports she restarted escitalopram and bupropion and is feeling better.  Monitor for concerns w/ sedation as patient agents with use of amitriptyline, gabapentin, and alprazolam QPM Monitor for concerns w/ serotonin syndrome with use of amitriptyline, bupropion, and escitalopram if additional serotonergic agents are added.  Patient Goals/Self-Care Activities Over the next 90 days, patient will:  - take medications as prescribed collaborate with provider on medication access solutions engage in dietary modifications by moderating portion sizes  Follow Up Plan: Telephone follow up appointment with care management team member scheduled for: ~ 12 weeks

## 2021-05-08 NOTE — Chronic Care Management (AMB) (Signed)
Chronic Care Management Pharmacy Note  05/08/2021 Name:  Sheri [name] MRN:  [id number] DOB:  02/13/1956   Subjective: Sheri [name] is an 65 y.o. year old female who is a primary patient of Darrick Huntsman, Mar Daring, MD.  The CCM team was consulted for assistance with disease management and care coordination needs.    Engaged with patient by telephone for follow up visit in response to provider referral for pharmacy case management and/or care coordination services.   Consent to Services:  The patient was given the following information about Chronic Care Management services today, agreed to services, and gave verbal consent: 1. CCM service includes personalized support from designated clinical staff supervised by the primary care provider, including individualized plan of care and coordination with other care providers 2. 24/7 contact phone numbers for assistance for urgent and routine care needs. 3. Service will only be billed when office clinical staff spend 20 minutes or more in a month to coordinate care. 4. Only one practitioner may furnish and bill the service in a calendar month. 5.The patient may stop CCM services at any time (effective at the end of the month) by phone call to the office staff. 6. The patient will be responsible for cost sharing (co-pay) of up to 20% of the service fee (after annual deductible is met). Patient agreed to services and consent obtained.  Patient Care Team: [name], MD as PCP - General (Internal Medicine) Lourena Simmonds, RPH-CPP (Pharmacist)   Objective:  Lab Results  Component Value Date   CREATININE 0.56 12/22/2020   CREATININE 0.61 06/23/2020   CREATININE 0.64 02/19/2020    Lab Results  Component Value Date   HGBA1C 5.8 12/22/2020   Last diabetic Eye exam:  Lab Results  Component Value Date/Time   HMDIABEYEEXA No Retinopathy 09/21/2019 12:00 AM    Last diabetic Foot exam:  Lab Results  Component Value Date/Time    HMDIABFOOTEX no retinopathy 02/28/2019 12:00 AM        Component Value Date/Time   CHOL 230 (H) 12/22/2020 1129   CHOL 113 08/29/2018 0916   TRIG 195.0 (H) 12/22/2020 1129   HDL 54.20 12/22/2020 1129   HDL 36 (L) 08/29/2018 0916   CHOLHDL 4 12/22/2020 1129   VLDL 39.0 12/22/2020 1129   LDLCALC 137 (H) 12/22/2020 1129   LDLCALC 57 08/29/2018 0916   LDLDIRECT 97.0 08/11/2016 0853    Hepatic Function Latest Ref Rng & Units 01/19/2021 12/22/2020 06/23/2020  Total Protein 6.0 - 8.3 g/dL 6.4 6.9 7.2  Albumin 3.5 - 5.2 g/dL 4.2 4.3 4.6  AST 0 - 37 U/L 20 17 17   ALT 0 - 35 U/L 25 19 14   Alk Phosphatase 39 - 117 U/L 76 77 65  Total Bilirubin 0.2 - 1.2 mg/dL 0.6 9.5(G) 1.0  Bilirubin, Direct 0.0 - 0.3 mg/dL 0.1 - -    Lab Results  Component Value Date/Time   TSH 1.36 12/22/2020 11:29 AM   TSH 2.440 06/25/2019 08:35 AM   FREET4 1.05 06/25/2019 08:35 AM   FREET4 1.10 05/30/2018 12:48 PM    CBC Latest Ref Rng & Units 05/30/2018 05/11/2013  WBC 3.4 - 10.8 x10E3/uL 6.6 5.1  Hemoglobin 11.1 - 15.9 g/dL 38.7 56.4  Hematocrit 33.2 - 46.6 % 43.8 40.0  Platelets 150.0 - 400.0 K/uL - 301.0    Lab Results  Component Value Date/Time   VD25OH 35.1 06/25/2019 08:35 AM   VD25OH 44.38 03/02/2019 10:45 AM   VD25OH 63.9  08/29/2018 09:16 AM   VD25OH 19.18 (L) 02/11/2017 08:55 AM    Clinical ASCVD: No  The 10-year ASCVD risk score Denman George DC Jr., et al., 2013) is: 16.4%   Values used to calculate the score:     Age: 69 years     Sex: Female     Is Non-Hispanic African American: No     Diabetic: Yes     Tobacco smoker: No     Systolic Blood Pressure: 134 mmHg     Is BP treated: Yes     HDL Cholesterol: 54.2 mg/dL     Total Cholesterol: 230 mg/dL      Social History   Tobacco Use  Smoking Status Never  Smokeless Tobacco Never   BP Readings from Last 3 Encounters:  03/27/21 (!) 134/96  01/19/21 124/76  12/22/20 (!) 126/92   Pulse Readings from Last 3 Encounters:  03/27/21 96   01/19/21 91  12/22/20 87   Wt Readings from Last 3 Encounters:  03/27/21 223 lb (101.2 kg)  01/19/21 224 lb (101.6 kg)  12/22/20 219 lb 12.8 oz (99.7 kg)    Assessment: Review of patient past medical history, allergies, medications, health status, including review of consultants reports, laboratory and other test data, was performed as part of comprehensive evaluation and provision of chronic care management services.   SDOH:  (Social Determinants of Health) assessments and interventions performed:  SDOH Interventions    Flowsheet Row Most Recent Value  SDOH Interventions   Financial Strain Interventions Other (Comment)  [manufacturer assistance]       CCM Care Plan  No Known Allergies  Medications Reviewed Today     Reviewed by Lourena Simmonds, RPH-CPP (Pharmacist) on 05/08/21 at 1306  Med List Status: <None>   Medication Order Taking? Sig Documenting Provider Last Dose Status Informant  ALPRAZolam (XANAX) 0.5 MG tablet [id number] Yes TAKE 1 TABLET BY MOUTH TWICE DAILY AS NEEDED FOR ANXIETY OR SLEEP [name], MD Taking Active            Med Note Lourena Simmonds   Fri Feb 06, 2021  2:33 PM) 0.5 mg QPM  amitriptyline (ELAVIL) 25 MG tablet [id number] Yes TAKE 3 TABLETS (75MG ) BY MOUTH AT BEDTIME FOR NEUROPATHY [name], MD Taking Active   atorvastatin (LIPITOR) 20 MG tablet [id number] Yes Take 1 tablet (20 mg total) by mouth daily. [name], MD Taking Active   buPROPion (WELLBUTRIN XL) 150 MG 24 hr tablet [id number] Yes Take 1 tablet (150 mg total) by mouth daily. [name], MD Taking Active   escitalopram (LEXAPRO) 10 MG tablet [id number] Yes Take 1 tablet (10 mg total) by mouth daily with breakfast. [name], MD Taking Active   furosemide (LASIX) 20 MG tablet [id number] Yes TAKE 1 TABLET BY MOUTH ONCE DAILY AS NEEDED FOR FLUID RETENTION [name], MD Taking Active   gabapentin (NEURONTIN) 300 MG capsule [id number] Yes TAKE 1 CAPSULE BY  MOUTH ONCE DAILY [name], MD Taking Active   meloxicam (MOBIC) 7.5 MG tablet [id number] Yes TAKE 1 TABLET BY MOUTH ONCE DAILY [name], MD Taking Active   methocarbamol (ROBAXIN) 500 MG tablet [id number] No Take 1 tablet (500 mg total) by mouth 3 (three) times daily.  Patient not taking: Reported on 05/08/2021   [name], MD Not Taking Active   pantoprazole (PROTONIX) 40 MG tablet [id number] Yes Take 1 tablet (40 mg total) by mouth daily. [name],  MD Taking Active   Semaglutide,0.25 or 0.5MG /DOS, (OZEMPIC, 0.25 OR 0.5 MG/DOSE,) 2 MG/1.5ML SOPN [id number] Yes Inject 0.25 mg into the skin once a week. [name], MD Taking Active             Patient Active Problem List   Diagnosis Date Noted   Grief reaction 03/29/2021   Elevated bilirubin 01/20/2021   History of COVID-19 05/20/2019   Educated about COVID-19 virus infection 02/25/2019   Barretts esophagus 04/07/2018   Lumbago syndrome 08/16/2017   Encounter for preventive health examination 11/14/2016   Vitamin D deficiency 08/15/2016   Hyperlipidemia associated with type 2 diabetes mellitus (HCC) 09/24/2014   Essential hypertension, benign 02/26/2014   Obesity, diabetes, and hypertension syndrome (HCC) 05/13/2013   Major depressive disorder, single episode 05/13/2013   Reflux esophagitis 05/11/2013   Insomnia, psychophysiological 05/17/2011    Immunization History  Administered Date(s) Administered   Influenza Split 05/29/2013, 06/03/2014   Influenza-Unspecified 06/27/2015, 06/25/2016, 06/25/2017, 06/25/2018, 06/21/2019   PFIZER(Purple Top)SARS-COV-2 Vaccination 09/11/2019, 10/02/2019, 07/15/2020   Pneumococcal Polysaccharide-23 09/23/2014   Td 06/23/2020   Tdap 05/29/2010   Zoster Recombinat (Shingrix) 03/27/2021    Conditions to be addressed/monitored: HTN, HLD, and DMII  Care Plan : Medication Management  Updates made by Lourena Simmonds, RPH-CPP since 05/08/2021 12:00 AM      Problem: Obesity, Diabetes, HTN, Chronic Pain, Depression      Long-Range Goal: Disease Progression Prevention   Start Date: 02/06/2021  This Visit's Progress: On track  Recent Progress: On track  Priority: High  Note:   Current Barriers:  Unable to achieve control of weight  Suboptimal therapeutic regimen for pain management  Pharmacist Clinical Goal(s):  Over the next 90 days, patient will achieve improvement in weight Over the next 90 days, patient will adhere to plan to optimize therapeutic regimen for pain as evidenced by report of adherence to recommended medication management changes through collaboration with PharmD and provider.   Interventions: 1:1 collaboration with [name], MD regarding development and update of comprehensive plan of care as evidenced by provider attestation and co-signature Inter-disciplinary care team collaboration (see longitudinal plan of care) Comprehensive medication review performed; medication list updated in electronic medical record  SDOH: Mother passed away. Husband's health is declining. Lots of stress lately.  Now has [employer] Medicare   Diabetes in the setting of Obesity: Unable to achieve weight management through diet and physical activity; current treatment: Ozempic 0.5 mg weekly Has not weighed lately, but clothes feel looser.  Taste changes s/p COVID improving  Current meal patterns: breakfast: Premier protein shake, sometimes egg, vegetable omelette; lunch: tomato sandwich w/ mustard (45 cal Terrill Mohr bread); dinner: grilled chicken salad; husband has a heart healthy, renal healthy, DM diet. Using Weight Watchers to monitor diabet.  Current exercise: limited by pain Discussed increasing to 1 mg weekly vs maintaining dose. Patient elects to maintain Ozempic 0.5 mg weekly.  Encouraged to continue to focus on healthy dietary habits and physical activity as able.  Discussed financial burdens. Patient meets income  criteria for patient assistance. Will collaborate w/ patient, provider, and CPhT for assistance and support.   Hyperlipidemia: Uncontrolled but anticipated to be improved; current treatment: atorvastatin 10 mg daily Previously recommended to continue current regimen at this time. Follow lipid panel with next lab work  Depression/Anxiety with Peripheral Neuropathy and Chronic Pain: Improving; current treatment: escitalopram 10 mg daily, bupropion XL 150 mg daily, alprazolam 0.5 mg QPM, amitriptyline 75 mg QPM, gabapentin  300 mg QPM, methocarbamol 500 mg TID PRN - taking QAM and QPM, meloxicam 7.5 mg PRN, every other day Intolerant of higher doses of gabapentin d/t LEE Reports she restarted escitalopram and bupropion and is feeling better.  Monitor for concerns w/ sedation as patient agents with use of amitriptyline, gabapentin, and alprazolam QPM Monitor for concerns w/ serotonin syndrome with use of amitriptyline, bupropion, and escitalopram if additional serotonergic agents are added.  Patient Goals/Self-Care Activities Over the next 90 days, patient will:  - take medications as prescribed collaborate with provider on medication access solutions engage in dietary modifications by moderating portion sizes  Follow Up Plan: Telephone follow up appointment with care management team member scheduled for: ~ 12 weeks      Medication Assistance: Application for Ozempic  medication assistance program. in process.  Anticipated assistance start date TBD.  See plan of care for additional detail.  Patient's preferred pharmacy is:  [facility] [address] [place] [address] Phone: [phone] Fax: [phone]  Follow Up:  Patient agrees to Care Plan and Follow-up.  Plan: Telephone follow up appointment with care management team member scheduled for:  ~ 12 weeks  Catie Feliz Beam, PharmD, [place], CPP Clinical Pharmacist [employer] [facility] [employer] [phone]

## 2021-05-12 ENCOUNTER — Telehealth: Payer: Self-pay | Admitting: Pharmacy Technician

## 2021-05-12 DIAGNOSIS — Z596 Low income: Secondary | ICD-10-CM

## 2021-05-12 NOTE — Progress Notes (Signed)
[facility])                                            [place] Team                                                                         Medication Assistance Referral  Referral From: Milroy Catie T.   Medication/Company: Ozempic / Novo Nordisk Patient application portion:  Education officer, museum portion:  N/A Embedded pharmacist had signed in clinic  to Dr. Deborra Medina Provider address/fax verified via: Office website    Sheri [name], [place]  [phone]

## 2021-05-14 ENCOUNTER — Other Ambulatory Visit: Payer: Self-pay

## 2021-05-20 ENCOUNTER — Telehealth: Payer: Self-pay | Admitting: Pharmacist

## 2021-05-20 NOTE — Telephone Encounter (Signed)
[employer] Medicare is not showing in the system as active for medical benefits, through pharmacy benefits are showing as active. Spoke with Systems analyst, they requested a picture of her insurance card. Called patient. She notes she will try to upload a picture via [place], but if unable to do so, will come by the office tomorrow to have her new card scanned into the system.

## 2021-05-21 NOTE — Telephone Encounter (Signed)
Insurance updated.

## 2021-06-29 ENCOUNTER — Other Ambulatory Visit: Payer: Self-pay

## 2021-06-29 ENCOUNTER — Encounter: Payer: Self-pay | Admitting: Internal Medicine

## 2021-06-29 ENCOUNTER — Ambulatory Visit (INDEPENDENT_AMBULATORY_CARE_PROVIDER_SITE_OTHER): Payer: PPO | Admitting: Internal Medicine

## 2021-06-29 VITALS — BP 120/88 | HR 92 | Temp 96.9°F | Ht 63.0 in | Wt 226.0 lb

## 2021-06-29 DIAGNOSIS — E785 Hyperlipidemia, unspecified: Secondary | ICD-10-CM

## 2021-06-29 DIAGNOSIS — E538 Deficiency of other specified B group vitamins: Secondary | ICD-10-CM

## 2021-06-29 DIAGNOSIS — E1169 Type 2 diabetes mellitus with other specified complication: Secondary | ICD-10-CM

## 2021-06-29 DIAGNOSIS — R9431 Abnormal electrocardiogram [ECG] [EKG]: Secondary | ICD-10-CM | POA: Insufficient documentation

## 2021-06-29 DIAGNOSIS — Z780 Asymptomatic menopausal state: Secondary | ICD-10-CM

## 2021-06-29 DIAGNOSIS — E1159 Type 2 diabetes mellitus with other circulatory complications: Secondary | ICD-10-CM

## 2021-06-29 DIAGNOSIS — Z1231 Encounter for screening mammogram for malignant neoplasm of breast: Secondary | ICD-10-CM

## 2021-06-29 DIAGNOSIS — Z13820 Encounter for screening for osteoporosis: Secondary | ICD-10-CM | POA: Diagnosis not present

## 2021-06-29 DIAGNOSIS — E559 Vitamin D deficiency, unspecified: Secondary | ICD-10-CM

## 2021-06-29 DIAGNOSIS — E669 Obesity, unspecified: Secondary | ICD-10-CM

## 2021-06-29 DIAGNOSIS — Z0000 Encounter for general adult medical examination without abnormal findings: Secondary | ICD-10-CM

## 2021-06-29 DIAGNOSIS — R5383 Other fatigue: Secondary | ICD-10-CM

## 2021-06-29 DIAGNOSIS — I152 Hypertension secondary to endocrine disorders: Secondary | ICD-10-CM

## 2021-06-29 DIAGNOSIS — Z23 Encounter for immunization: Secondary | ICD-10-CM

## 2021-06-29 DIAGNOSIS — I10 Essential (primary) hypertension: Secondary | ICD-10-CM | POA: Diagnosis not present

## 2021-06-29 LAB — CBC WITH DIFFERENTIAL/PLATELET
Basophils Absolute: 0 10*3/uL (ref 0.0–0.1)
Basophils Relative: 0.7 % (ref 0.0–3.0)
Eosinophils Absolute: 0.1 10*3/uL (ref 0.0–0.7)
Eosinophils Relative: 2.2 % (ref 0.0–5.0)
HCT: 42.9 % (ref 36.0–46.0)
Hemoglobin: 14.5 g/dL (ref 12.0–15.0)
Lymphocytes Relative: 28.9 % (ref 12.0–46.0)
Lymphs Abs: 1.7 10*3/uL (ref 0.7–4.0)
MCHC: 33.8 g/dL (ref 30.0–36.0)
MCV: 85.9 fl (ref 78.0–100.0)
Monocytes Absolute: 0.5 10*3/uL (ref 0.1–1.0)
Monocytes Relative: 8.1 % (ref 3.0–12.0)
Neutro Abs: 3.4 10*3/uL (ref 1.4–7.7)
Neutrophils Relative %: 60.1 % (ref 43.0–77.0)
Platelets: 267 10*3/uL (ref 150.0–400.0)
RBC: 5 Mil/uL (ref 3.87–5.11)
RDW: 12.6 % (ref 11.5–15.5)
WBC: 5.7 10*3/uL (ref 4.0–10.5)

## 2021-06-29 LAB — VITAMIN D 25 HYDROXY (VIT D DEFICIENCY, FRACTURES): VITD: 21.39 ng/mL — ABNORMAL LOW (ref 30.00–100.00)

## 2021-06-29 LAB — COMPREHENSIVE METABOLIC PANEL
ALT: 17 U/L (ref 0–35)
AST: 16 U/L (ref 0–37)
Albumin: 4.4 g/dL (ref 3.5–5.2)
Alkaline Phosphatase: 86 U/L (ref 39–117)
BUN: 16 mg/dL (ref 6–23)
CO2: 24 mEq/L (ref 19–32)
Calcium: 9.3 mg/dL (ref 8.4–10.5)
Chloride: 104 mEq/L (ref 96–112)
Creatinine, Ser: 0.64 mg/dL (ref 0.40–1.20)
GFR: 92.85 mL/min (ref >60.00)
Glucose, Bld: 101 mg/dL — ABNORMAL HIGH (ref 70–99)
Potassium: 4.2 mEq/L (ref 3.5–5.1)
Sodium: 136 mEq/L (ref 135–145)
Total Bilirubin: 1.1 mg/dL (ref 0.2–1.2)
Total Protein: 7 g/dL (ref 6.0–8.3)

## 2021-06-29 LAB — LIPID PANEL
Cholesterol: 223 mg/dL — ABNORMAL HIGH (ref 0–200)
HDL: 44.1 mg/dL (ref >39.00)
NonHDL: 179.07
Total CHOL/HDL Ratio: 5
Triglycerides: 238 mg/dL — ABNORMAL HIGH (ref 0.0–149.0)
VLDL: 47.6 mg/dL — ABNORMAL HIGH (ref 0.0–40.0)

## 2021-06-29 LAB — B12 AND FOLATE PANEL
Folate: 8.7 ng/mL (ref >5.9)
Vitamin B-12: 272 pg/mL (ref 211–911)

## 2021-06-29 LAB — LDL CHOLESTEROL, DIRECT: Direct LDL: 151 mg/dL

## 2021-06-29 LAB — HEMOGLOBIN A1C: Hgb A1c MFr Bld: 6.2 % (ref 4.6–6.5)

## 2021-06-29 MED ORDER — TIZANIDINE HCL 4 MG PO TABS
4.0000 mg | ORAL_TABLET | Freq: Four times a day (QID) | ORAL | 3 refills | Status: DC | PRN
  Filled 2021-06-29: qty 30, 8d supply, fill #0

## 2021-06-29 MED ORDER — RYBELSUS 3 MG PO TABS
3.0000 mg | ORAL_TABLET | Freq: Every day | ORAL | 2 refills | Status: DC
  Filled 2021-06-29: qty 30, 30d supply, fill #0
  Filled 2021-08-07: qty 30, 30d supply, fill #1

## 2021-06-29 MED ORDER — ALPRAZOLAM 0.5 MG PO TABS
ORAL_TABLET | ORAL | 2 refills | Status: DC
  Filled 2021-06-29: qty 45, 23d supply, fill #0
  Filled 2021-08-07: qty 45, 23d supply, fill #1
  Filled 2021-09-24: qty 45, 23d supply, fill #2

## 2021-06-29 MED ORDER — ZOSTER VAC RECOMB ADJUVANTED 50 MCG/0.5ML IM SUSR: 0.5000 mL | Freq: Once | INTRAMUSCULAR | 1 refills | Status: AC

## 2021-06-29 NOTE — Patient Instructions (Addendum)
You need a "diabetic eye exam" once a year by your eye doctor and he needs to  send this to [name]  annually  I want you to try taking Rybelsus for diabetes and weight management.  The starting dose is 3 mg daily  it is the same medication that is in Ozempic but it's in pill form  Tizanidine is your new muscle relaxer  Start your walking program!

## 2021-06-29 NOTE — Progress Notes (Signed)
The patient is here for the Welcome to  Medicare  preventive visit .  This visit occurred during the SARS-CoV-2 public health emergency.  Safety protocols were in place, including screening questions prior to the visit, additional usage of staff PPE, and extensive cleaning of exam room while observing appropriate contact time as indicated for disinfecting solutions.    Sheri [name] is a 65 yr old female who  has a past medical history of Abdominal pain, epigastric, Anxiety, Back pain, Diabetes mellitus, type 2 ([place]), Diverticulosis of colon without hemorrhage, Duodenitis with hemorrhage, Edema, Fluid retention, GERD (gastroesophageal reflux disease), Heart murmur, Hyperlipidemia, Hypertension, Leg edema, Mitral regurgitation, Obesity, unspecified, Osteopenia, Panic disorder without agoraphobia, Peripheral neuropathy, Personal history of malignant neoplasm of cervix uteri, Personal history of other genital system and obstetric disorders(V13.29), Prediabetes, Rash and other nonspecific skin eruption, Reflux esophagitis, Swallowing difficulty, Unspecified hereditary and idiopathic peripheral neuropathy, Unspecified sleep apnea, and Vitamin D deficiency.    reports that she has never smoked. She has never used smokeless tobacco. She reports current alcohol use. She reports that she does not use drugs.   The roster of all physicians providing medical care to patient : Patient Care Team: Crecencio Mc, MD as PCP - General (Internal Medicine) De Hollingshead, RPH-CPP (Pharmacist)  Activities of daily living:  The patient is 100% independent in all ADLs: dressing, toileting, feeding as well as independent mobility Fall risk was assessed by direct patient evaluation of patient's balance, gait and ability to risk from a chair and from a kneeling position. Home safety : The patient has smoke detectors in the home. They wear seatbelts.  There are no firearms at home. There is no violence in the home.  Patient has  seen their eye doctor in the last year.   Visual acuity was assessed today  and was 20/20 with correction lenses. Patient denies hearing difficulty with regard to whispered voices and some television programs and has  deferred audiologic testing in the last year.   There is no risks for hepatitis, STDs or HIV. There is no   history of blood transfusion. They have no travel history to infectious disease endemic areas of the world.  The patient has seen their dentist in the last six month.  They do not  have excessive sun exposure. Discussed the need for sun protection: hats, long sleeves and use of sunscreen if there is significant sun exposure.   Diet: the importance of a healthy diet is discussed. They do have a healthy diet.  The benefits of regular aerobic exercise were discussed. Patient walks 4 times per week ,  a minimum of 20 minutes.   Depression screen:   Depression screen [facility] 2/9 06/29/2021 03/27/2021 01/19/2021 12/22/2020 02/19/2020  Decreased Interest 0 1 2 2  0  Down, Depressed, Hopeless 0 - 2 2 0  PHQ - 2 Score 0 1 4 4  0  Altered sleeping 0 0 0 2 0  Tired, decreased energy 0 1 2 3  0  Change in appetite 0 1 1 3  0  Feeling bad or failure about yourself  0 0 1 2 0  Trouble concentrating 0 0 1 2 0  Moving slowly or fidgety/restless 0 0 0 0 0  Suicidal thoughts 0 0 0 0 0  PHQ-9 Score 0 3 9 16  0  Difficult doing work/chores Not difficult at all Not difficult at all Somewhat difficult Somewhat difficult Not difficult at all  Some recent data might be hidden  Cognitive assessment: the patient manages all their financial and personal affairs and is actively engaged. They could relate day,date,year and events; recalled 2/3 objects at 3 minutes; performed clock-face test normally.  The following portions of the patient's history were reviewed and updated as appropriate: allergies, current medications, past family history, past medical history,  past surgical history, past social history   and problem list.  During the course of the visit the patient was educated and counseled about appropriate screening and preventive services including : fall prevention , diabetes screening, nutrition counseling, colorectal cancer screening, and recommended immunizations   Immunization History  Administered Date(s) Administered   Fluad Quad(high Dose 65+) 06/29/2021   Influenza Split 05/29/2013, 06/03/2014   Influenza-Unspecified 06/27/2015, 06/25/2016, 06/25/2017, 06/25/2018, 06/21/2019   PFIZER(Purple Top)SARS-COV-2 Vaccination 09/11/2019, 10/02/2019, 07/15/2020   PNEUMOCOCCAL CONJUGATE-20 06/29/2021   Pneumococcal Polysaccharide-23 09/23/2014   Td 06/23/2020   Tdap 05/29/2010   Zoster Recombinat (Shingrix) 03/27/2021  HMLISTPATIENTFRIENDLY@ Health Maintenance Due  Topic Date Due   OPHTHALMOLOGY EXAM  09/20/2020   COVID-19 Vaccine (4 - Booster for [place] series) 11/15/2020   DEXA SCAN  Never done   URINE MICROALBUMIN  06/23/2021  .  Last skin cancer screening :   Cc: 1) diabetes and obesity:  did not tolerate ozempic trial due to skin irritation . Did not toelrate metformin due to GI side effects  (used in 2020 by Dr Leafy Ro)  personal best was 195 lbs in Dec 2020.  Exercisecapacity/inclination   limited  by Joint pain in left elbow (history of fracture),  anklesin , increased caregiver responsibiities since her retirement from [employer] in July 15.  Event though her mother passed away prior to last visit ,  her husband has had 2 7 day hospitalizations,  first  in July, then again in September  for complications fro mhis diabetes Discussed alternative Rybelsus in oral form.    Vital Signs: BP 120/88 (BP Location: Left Arm, Patient Position: Sitting, Cuff Size: Large)   Pulse 92   Temp (!) 96.9 F (36.1 C) (Temporal)   Ht 5\' 3"  (1.6 m)   Wt 226 lb (102.5 kg)   SpO2 97%   BMI 40.03 kg/m    Exam: General appearance: alert, cooperative and appears stated age Head: Normocephalic,  without obvious abnormality, atraumatic Eyes: conjunctivae/corneas clear. PERRL, EOM's intact. Fundi benign. Ears: normal TM's and external ear canals both ears Nose: Nares normal. Septum midline. Mucosa normal. No drainage or sinus tenderness. Throat: lips, mucosa, and tongue normal; teeth and gums normal Neck: no adenopathy, no carotid bruit, no JVD, supple, symmetrical, trachea midline and thyroid not enlarged, symmetric, no tenderness/mass/nodules Lungs: clear to auscultation bilaterally Breasts: normal appearance, no masses or tenderness Heart: regular rate and rhythm, S1, S2 normal, no murmur, click, rub or gallop Abdomen: soft, non-tender; bowel sounds normal; no masses,  no organomegaly Extremities: extremities normal, atraumatic, no cyanosis or edema Pulses: 2+ and symmetric Skin: Skin color, texture, turgor normal. No rashes or lesions Neurologic: Alert and oriented X 3, normal strength and tone. Normal symmetric reflexes. Normal coordination and gait.     End of Life Discussion and Planning   During the course of the visit , End of Life objectives were discussed at length.  Patient does not have a living will in place or a healthcare power of attorney.  Patient  was given printed information about advance directives and encouraged to return after discussing with their family.  Review of Opioid Prescriptions    Patient does  not have a current opioid prescription Patient's risk factors for opioid use disorder was reviewed  Treatment of pain using non-opioid alternatives was reviewed  and encouraged   Patient risk for potential substance abuse was assessed and addressed with counselling.     Assessment and Plan:  Obesity, diabetes, and hypertension syndrome ([place]) We have had difficulty obtaining her eye exam reports from [facility] .  She has monocular contact lenses  (left eye for reading,  Right eye for distance).  Told she had a small cataract  20/200 at last visit with  [place] [facility]   Abnormal electrocardiogram (ECG) (EKG) I have ordered and reviewed a 12 lead EKG and find that there are no acute changes and patient is in sinus rhythm.  The previously seen inferior lead Q waves have been present since 2013  a nd were evaluated by Dr Fletcher Anon at that times    Encounter for preventive health examination age appropriate education and counseling updated, referrals for preventative services and immunizations addressed, dietary and smoking counseling addressed, most recent labs reviewed.  I have personally reviewed and have noted:   1) the patient's medical and social history 2) The pt's use of alcohol, tobacco, and illicit drugs 3) The patient's current medications and supplements 4) Functional ability including ADL's, fall risk, home safety risk, hearing and visual impairment 5) Diet and physical activities 6) Evidence for depression or mood disorder 7) The patient's height, weight, and BMI have been recorded in the chart   I have made referrals, and provided counseling and education based on review of the above  Essential hypertension, benign Managed with diuretic only.  Lab Results  Component Value Date   CREATININE 0.64 06/29/2021   Lab Results  Component Value Date   NA 136 06/29/2021   K 4.2 06/29/2021   CL 104 06/29/2021   CO2 24 06/29/2021    Lab Results  Component Value Date   LABMICR 14.9 05/30/2018   MICROALBUR <0.7 06/23/2020   MICROALBUR <0.7 02/19/2020       Hyperlipidemia associated with type 2 diabetes mellitus Glycemic control is fine,  But Weight regain remains  problem.   She does not have increased appetite;  And Her sense of taste is gradually improving since being  altered by COVID infection. Did not tolerate Ozempic .  Trying Rybelsus. Encouraged to prioritize time to  Exercise   Lab Results  Component Value Date   HGBA1C 6.2 06/29/2021   Lab Results  Component Value Date   LABMICR 14.9 05/30/2018    MICROALBUR <0.7 06/23/2020   MICROALBUR <0.7 02/19/2020     Updated Medication List Outpatient Encounter Medications as of 06/29/2021  Medication Sig   amitriptyline (ELAVIL) 25 MG tablet TAKE 3 TABLETS (75MG ) BY MOUTH AT BEDTIME FOR NEUROPATHY   atorvastatin (LIPITOR) 20 MG tablet Take 1 tablet (20 mg total) by mouth daily.   buPROPion (WELLBUTRIN XL) 150 MG 24 hr tablet Take 1 tablet (150 mg total) by mouth daily.   escitalopram (LEXAPRO) 10 MG tablet Take 1 tablet (10 mg total) by mouth daily with breakfast.   furosemide (LASIX) 20 MG tablet TAKE 1 TABLET BY MOUTH ONCE DAILY AS NEEDED FOR FLUID RETENTION   gabapentin (NEURONTIN) 300 MG capsule TAKE 1 CAPSULE BY MOUTH ONCE DAILY   meloxicam (MOBIC) 7.5 MG tablet TAKE 1 TABLET BY MOUTH ONCE DAILY   pantoprazole (PROTONIX) 40 MG tablet Take 1 tablet (40 mg total) by mouth daily.   Semaglutide (RYBELSUS)  3 MG TABS Take 3 mg by mouth daily.   tiZANidine (ZANAFLEX) 4 MG tablet Take 1 tablet (4 mg total) by mouth every 6 (six) hours as needed for muscle spasms.   [EXPIRED] Zoster Vaccine Adjuvanted [facility]) injection Inject 0.5 mLs into the muscle once for 1 dose.   [DISCONTINUED] ALPRAZolam (XANAX) 0.5 MG tablet TAKE 1 TABLET BY MOUTH TWICE DAILY AS NEEDED FOR ANXIETY OR SLEEP   [DISCONTINUED] methocarbamol (ROBAXIN) 500 MG tablet Take 1 tablet (500 mg total) by mouth 3 (three) times daily.   ALPRAZolam (XANAX) 0.5 MG tablet TAKE 1 TABLET BY MOUTH TWICE DAILY AS NEEDED FOR ANXIETY OR SLEEP   [DISCONTINUED] Semaglutide,0.25 or 0.5MG /DOS, (OZEMPIC, 0.25 OR 0.5 MG/DOSE,) 2 MG/1.5ML SOPN Inject 0.25 mg into the skin once a week. (Patient not taking: Reported on 06/29/2021)   No facility-administered encounter medications on file as of 06/29/2021.

## 2021-06-29 NOTE — Assessment & Plan Note (Addendum)
We have had difficulty obtaining her eye exam reports from [facility] .  She has monocular contact lenses  (left eye for reading,  Right eye for distance).  Told she had a small cataract  20/200 at last visit with [place] and [facility]

## 2021-06-29 NOTE — Assessment & Plan Note (Addendum)
I have ordered and reviewed a 12 lead EKG and find that there are no acute changes and patient is in sinus rhythm.  The previously seen inferior lead Q waves have been present since 2013  a nd were evaluated by Dr Fletcher Anon at that times

## 2021-06-30 LAB — IRON,TIBC AND FERRITIN PANEL
%SAT: 43 % (calc) (ref 16–45)
Ferritin: 246 ng/mL (ref 16–288)
Iron: 114 ug/dL (ref 45–160)
TIBC: 268 mcg/dL (calc) (ref 250–450)

## 2021-06-30 NOTE — Assessment & Plan Note (Signed)

## 2021-06-30 NOTE — Assessment & Plan Note (Addendum)
Glycemic control is fine,  But Weight regain remains  problem.   She does not have increased appetite;  And Her sense of taste is gradually improving since being  altered by COVID infection. Did not tolerate Ozempic .  Trying Rybelsus. Encouraged to prioritize time to  Exercise increasing atorvastatin  to 40 mg   Lab Results  Component Value Date   HGBA1C 6.2 06/29/2021   Lab Results  Component Value Date   LABMICR 14.9 05/30/2018   MICROALBUR <0.7 06/23/2020   MICROALBUR <0.7 02/19/2020      Lab Results  Component Value Date   CHOL 223 (H) 06/29/2021   HDL 44.10 06/29/2021   LDLCALC 137 (H) 12/22/2020   LDLDIRECT 151.0 06/29/2021   TRIG 238.0 (H) 06/29/2021   CHOLHDL 5 06/29/2021

## 2021-06-30 NOTE — Assessment & Plan Note (Signed)
Managed with diuretic only.  Lab Results  Component Value Date   CREATININE 0.64 06/29/2021   Lab Results  Component Value Date   NA 136 06/29/2021   K 4.2 06/29/2021   CL 104 06/29/2021   CO2 24 06/29/2021    Lab Results  Component Value Date   LABMICR 14.9 05/30/2018   MICROALBUR <0.7 06/23/2020   MICROALBUR <0.7 02/19/2020

## 2021-07-01 DIAGNOSIS — E538 Deficiency of other specified B group vitamins: Secondary | ICD-10-CM | POA: Insufficient documentation

## 2021-07-01 MED ORDER — ERGOCALCIFEROL 1.25 MG (50000 UT) PO CAPS
50000.0000 [IU] | ORAL_CAPSULE | ORAL | 0 refills | Status: DC
  Filled 2021-07-01: qty 12, 84d supply, fill #0

## 2021-07-01 MED ORDER — ATORVASTATIN CALCIUM 40 MG PO TABS
40.0000 mg | ORAL_TABLET | Freq: Every day | ORAL | 1 refills | Status: DC
  Filled 2021-07-01: qty 90, 90d supply, fill #0
  Filled 2021-09-24: qty 90, 90d supply, fill #1

## 2021-07-01 NOTE — Assessment & Plan Note (Signed)
IM supplementation advised.   Lab Results  Component Value Date   VITAMINB12 272 06/29/2021

## 2021-07-01 NOTE — Addendum Note (Signed)
Addended by: Crecencio Mc on: 07/01/2021 09:39 PM   Modules accepted: Orders

## 2021-07-01 NOTE — Assessment & Plan Note (Signed)
Recurrent,   Drisdol 50K weekly x 12

## 2021-07-02 ENCOUNTER — Other Ambulatory Visit: Payer: Self-pay

## 2021-07-02 ENCOUNTER — Telehealth: Payer: Self-pay

## 2021-07-02 NOTE — Telephone Encounter (Signed)
LMTCB. Need to let pt know that the urine protein was not done and see if she can come back in this month to have done.

## 2021-07-07 ENCOUNTER — Ambulatory Visit (INDEPENDENT_AMBULATORY_CARE_PROVIDER_SITE_OTHER): Payer: PPO

## 2021-07-07 ENCOUNTER — Other Ambulatory Visit: Payer: Self-pay

## 2021-07-07 DIAGNOSIS — E538 Deficiency of other specified B group vitamins: Secondary | ICD-10-CM | POA: Diagnosis not present

## 2021-07-07 MED ORDER — CYANOCOBALAMIN 1000 MCG/ML IJ SOLN
1000.0000 ug | Freq: Once | INTRAMUSCULAR | Status: AC
  Administered 2021-07-07: 1000 ug via INTRAMUSCULAR

## 2021-07-07 NOTE — Progress Notes (Signed)
Patient presented for B 12 injection to right deltoid, patient voiced no concerns nor showed any signs of distress during injection. 

## 2021-07-14 ENCOUNTER — Ambulatory Visit (INDEPENDENT_AMBULATORY_CARE_PROVIDER_SITE_OTHER): Payer: PPO

## 2021-07-14 ENCOUNTER — Other Ambulatory Visit: Payer: Self-pay

## 2021-07-14 DIAGNOSIS — E1169 Type 2 diabetes mellitus with other specified complication: Secondary | ICD-10-CM | POA: Diagnosis not present

## 2021-07-14 DIAGNOSIS — E669 Obesity, unspecified: Secondary | ICD-10-CM | POA: Diagnosis not present

## 2021-07-14 DIAGNOSIS — E1159 Type 2 diabetes mellitus with other circulatory complications: Secondary | ICD-10-CM | POA: Diagnosis not present

## 2021-07-14 DIAGNOSIS — I152 Hypertension secondary to endocrine disorders: Secondary | ICD-10-CM | POA: Diagnosis not present

## 2021-07-14 DIAGNOSIS — E538 Deficiency of other specified B group vitamins: Secondary | ICD-10-CM

## 2021-07-14 LAB — MICROALBUMIN / CREATININE URINE RATIO
Creatinine,U: 212 mg/dL
Microalb Creat Ratio: 0.7 mg/g (ref 0.0–30.0)
Microalb, Ur: 1.5 mg/dL (ref 0.0–1.9)

## 2021-07-14 MED ORDER — CYANOCOBALAMIN 1000 MCG/ML IJ SOLN
1000.0000 ug | Freq: Once | INTRAMUSCULAR | Status: AC
  Administered 2021-07-14: 1000 ug via INTRAMUSCULAR

## 2021-07-14 NOTE — Progress Notes (Signed)
Patient presented for B 12 injection to right deltoid, patient voiced no concerns nor showed any signs of distress during injection. 

## 2021-07-16 ENCOUNTER — Other Ambulatory Visit (HOSPITAL_COMMUNITY): Payer: Self-pay

## 2021-07-21 ENCOUNTER — Other Ambulatory Visit: Payer: Self-pay

## 2021-07-21 ENCOUNTER — Ambulatory Visit (INDEPENDENT_AMBULATORY_CARE_PROVIDER_SITE_OTHER): Payer: PPO

## 2021-07-21 DIAGNOSIS — E538 Deficiency of other specified B group vitamins: Secondary | ICD-10-CM

## 2021-07-21 MED ORDER — CYANOCOBALAMIN 1000 MCG/ML IJ SOLN
1000.0000 ug | Freq: Once | INTRAMUSCULAR | Status: AC
  Administered 2021-07-21: 1000 ug via INTRAMUSCULAR

## 2021-07-21 NOTE — Progress Notes (Addendum)
Per orders of Dr. Derrel Nip, injection of #3 of 4 weekly B12 1000 mcg/ml given by Pilar Grammes, CMA in Right Deltoid. Patient tolerated injection well.

## 2021-07-28 ENCOUNTER — Other Ambulatory Visit: Payer: Self-pay

## 2021-07-28 ENCOUNTER — Ambulatory Visit (INDEPENDENT_AMBULATORY_CARE_PROVIDER_SITE_OTHER): Payer: PPO

## 2021-07-28 DIAGNOSIS — E538 Deficiency of other specified B group vitamins: Secondary | ICD-10-CM

## 2021-07-28 MED ORDER — CYANOCOBALAMIN 1000 MCG/ML IJ SOLN
1000.0000 ug | Freq: Once | INTRAMUSCULAR | Status: AC
  Administered 2021-07-28: 1000 ug via INTRAMUSCULAR

## 2021-07-28 NOTE — Progress Notes (Signed)
Patient presented for B 12 injection to right deltoid, patient voiced no concerns nor showed any signs of distress during injection. 

## 2021-07-29 DIAGNOSIS — E119 Type 2 diabetes mellitus without complications: Secondary | ICD-10-CM | POA: Diagnosis not present

## 2021-07-29 LAB — HM DIABETES EYE EXAM

## 2021-07-30 LAB — HM DIABETES EYE EXAM

## 2021-08-04 ENCOUNTER — Other Ambulatory Visit: Payer: Self-pay

## 2021-08-04 ENCOUNTER — Ambulatory Visit
Admission: RE | Admit: 2021-08-04 | Discharge: 2021-08-04 | Disposition: A | Discharge status: Home / Self Care | Payer: PPO | Source: Ambulatory Visit | Attending: Internal Medicine | Admitting: Internal Medicine

## 2021-08-04 DIAGNOSIS — M85852 Other specified disorders of bone density and structure, left thigh: Secondary | ICD-10-CM | POA: Diagnosis not present

## 2021-08-04 DIAGNOSIS — Z13820 Encounter for screening for osteoporosis: Secondary | ICD-10-CM | POA: Diagnosis not present

## 2021-08-04 DIAGNOSIS — Z1231 Encounter for screening mammogram for malignant neoplasm of breast: Secondary | ICD-10-CM | POA: Insufficient documentation

## 2021-08-04 DIAGNOSIS — Z780 Asymptomatic menopausal state: Secondary | ICD-10-CM | POA: Diagnosis not present

## 2021-08-04 DIAGNOSIS — M8580 Other specified disorders of bone density and structure, unspecified site: Secondary | ICD-10-CM | POA: Insufficient documentation

## 2021-08-06 DIAGNOSIS — Z780 Asymptomatic menopausal state: Secondary | ICD-10-CM | POA: Insufficient documentation

## 2021-08-07 ENCOUNTER — Other Ambulatory Visit: Payer: Self-pay

## 2021-08-07 ENCOUNTER — Telehealth: Payer: Self-pay | Admitting: Pharmacist

## 2021-08-07 ENCOUNTER — Telehealth: Payer: Self-pay

## 2021-08-07 NOTE — Telephone Encounter (Signed)
Chronic Care Management   Note  08/07/2021 Name: Sheri [name] MRN: [id number] DOB: 02/19/1956   Attempted to contact patient for scheduled appointment for medication management support. Left HIPAA compliant message for patient to return my call at their convenience.    Plan: - If I do not hear back from the patient by end of business today, will collaborate with Care Guide to outreach to schedule follow up with me   Catie Feliz Beam, PharmD, [place], CPP Clinical Pharmacist [employer] at [employer] [phone]

## 2021-08-07 NOTE — Telephone Encounter (Signed)
Appointment has been rescheduled.

## 2021-08-18 ENCOUNTER — Ambulatory Visit: Payer: Self-pay | Admitting: Pharmacist

## 2021-08-18 ENCOUNTER — Other Ambulatory Visit: Payer: Self-pay

## 2021-08-18 DIAGNOSIS — I152 Hypertension secondary to endocrine disorders: Secondary | ICD-10-CM

## 2021-08-18 DIAGNOSIS — E1169 Type 2 diabetes mellitus with other specified complication: Secondary | ICD-10-CM

## 2021-08-18 DIAGNOSIS — E785 Hyperlipidemia, unspecified: Secondary | ICD-10-CM

## 2021-08-18 DIAGNOSIS — E559 Vitamin D deficiency, unspecified: Secondary | ICD-10-CM

## 2021-08-18 DIAGNOSIS — E669 Obesity, unspecified: Secondary | ICD-10-CM

## 2021-08-18 MED ORDER — RYBELSUS 7 MG PO TABS
7.0000 mg | ORAL_TABLET | Freq: Every day | ORAL | 2 refills | Status: DC
  Filled 2021-08-18: qty 30, 30d supply, fill #0

## 2021-08-18 NOTE — Patient Instructions (Signed)
Keira,   Increase Rybelsus to 7 mg daily. Make sure you are taking this first thing in the morning on an empty stomach and waiting 30 minutes before breakfast or other medications.   Overall, we recommend a total of 1200 mg of calcium and [phone] units of Vitamin D daily for women with osteopenia. You get at least 1 serving of dairy daily, so you should target a supplement with ~ 600 mg of calcium and [phone] units of Vitamin D daily. Ideally, I would advise you to purchase "calcium citrate" when you need a new bottle instead of a formulation with "calcium carbonate", as citrate is better absorbed in patients on concurrent acid reflux medications.   Let us know if you have any questions or concerns. Take care!  [name], PharmD  Visit Information  Following are the goals we discussed today:  Patient Goals/Self-Care Activities Over the next 90 days, patient will:  - take medications as prescribed collaborate with provider on medication access solutions engage in dietary modifications by moderating portion sizes        Plan: Telephone follow up appointment with care management team member scheduled for:  6 weeks   [name], PharmD, [place], CPP Clinical Pharmacist [place] at [facility] [phone]   Please call the care guide team at [phone] if you need to cancel or reschedule your appointment.   Patient verbalizes understanding of instructions provided today and agrees to view in [place].

## 2021-08-18 NOTE — Chronic Care Management (AMB) (Signed)
Chronic Care Management CCM Pharmacy Note  08/18/2021 Name:  Sheri [name] MRN:  [id number] DOB:  1956-04-28  Summary: - Tolerating Rybelsus 3 mg daily.   Recommendations/Changes made from today's visit: - Increase Rybelsus to 7 mg daily.   Subjective: Sheri [name] is an 65 y.o. year old female who is a primary patient of Tullo, Mar Daring, MD.  The CCM team was consulted for assistance with disease management and care coordination needs.    Engaged with patient by telephone for follow up visit for pharmacy case management and/or care coordination services.   Objective:  Medications Reviewed Today     Reviewed by Lourena Simmonds, RPH-CPP (Pharmacist) on 08/18/21 at 0955  Med List Status: <None>   Medication Order Taking? Sig Documenting Provider Last Dose Status Informant  ALPRAZolam (XANAX) 0.5 MG tablet [id number] Yes TAKE 1 TABLET BY MOUTH TWICE DAILY AS NEEDED FOR ANXIETY OR SLEEP [name], MD Taking Active   amitriptyline (ELAVIL) 25 MG tablet [id number] Yes TAKE 3 TABLETS (75MG ) BY MOUTH AT BEDTIME FOR NEUROPATHY [name], MD Taking Active   atorvastatin (LIPITOR) 40 MG tablet [id number] Yes Take 1 tablet (40 mg total) by mouth daily. [name], MD Taking Active   buPROPion (WELLBUTRIN XL) 150 MG 24 hr tablet [id number] Yes Take 1 tablet (150 mg total) by mouth daily. [name], MD Taking Active   Calcium Carb-Cholecalciferol 600-20 MG-MCG TABS [id number] Yes Take 1 tablet by mouth daily. [provider] Taking Active   ergocalciferol (VITAMIN D2) 1.25 MG (50000 UT) capsule [id number] Yes Take 1 capsule (50,000 Units total) by mouth once a week. [name], MD Taking Active   escitalopram (LEXAPRO) 10 MG tablet [id number] Yes Take 1 tablet (10 mg total) by mouth daily with breakfast. [name], MD Taking Active   furosemide (LASIX) 20 MG tablet [id number] Yes TAKE 1 TABLET BY MOUTH ONCE DAILY AS NEEDED FOR FLUID RETENTION [name], MD Taking Active   gabapentin (NEURONTIN) 300 MG capsule [id number] Yes TAKE 1 CAPSULE BY MOUTH ONCE DAILY [name], MD Taking Active   meloxicam (MOBIC) 7.5 MG tablet [id number] Yes TAKE 1 TABLET BY MOUTH ONCE DAILY [name], MD Taking Active   pantoprazole (PROTONIX) 40 MG tablet [id number] Yes Take 1 tablet (40 mg total) by mouth daily. [name], MD Taking Active   Semaglutide [facility]) 3 MG TABS [id number] Yes Take 3 mg by mouth daily. [name], MD Taking Active   tiZANidine (ZANAFLEX) 4 MG tablet [id number] Yes Take 1 tablet (4 mg total) by mouth every 6 (six) hours as needed for muscle spasms. [name], MD Taking Active             Pertinent Labs:   Lab Results  Component Value Date   HGBA1C 6.2 06/29/2021   Lab Results  Component Value Date   CHOL 223 (H) 06/29/2021   HDL 44.10 06/29/2021   LDLCALC 137 (H) 12/22/2020   LDLDIRECT 151.0 06/29/2021   TRIG 238.0 (H) 06/29/2021   CHOLHDL 5 06/29/2021   Lab Results  Component Value Date   CREATININE 0.64 06/29/2021   BUN 16 06/29/2021   NA 136 06/29/2021   K 4.2 06/29/2021   CL 104 06/29/2021   CO2 24 06/29/2021    SDOH:  (Social Determinants of Health) assessments and interventions performed:  SDOH Interventions    Flowsheet Row Most Recent Value  SDOH Interventions  Financial Strain Interventions Intervention Not Indicated       CCM Care Plan  Review of patient past medical history, allergies, medications, health status, including review of consultants reports, laboratory and other test data, was performed as part of comprehensive evaluation and provision of chronic care management services.   Care Plan : Medication Management  Updates made by Lourena Simmonds, RPH-CPP since 08/18/2021 12:00 AM     Problem: Obesity, Diabetes, HTN, Chronic Pain, Depression      Long-Range Goal: Disease Progression Prevention   Start Date: 02/06/2021  Recent Progress: On track   Priority: High  Note:   Current Barriers:  Unable to achieve control of weight  Suboptimal therapeutic regimen for pain management  Pharmacist Clinical Goal(s):  Over the next 90 days, patient will achieve improvement in weight Over the next 90 days, patient will adhere to plan to optimize therapeutic regimen for pain as evidenced by report of adherence to recommended medication management changes through collaboration with PharmD and provider.   Interventions: 1:1 collaboration with [name], MD regarding development and update of comprehensive plan of care as evidenced by provider attestation and co-signature Inter-disciplinary care team collaboration (see longitudinal plan of care) Comprehensive medication review performed; medication list updated in electronic medical record   Diabetes in the setting of Obesity: Unable to achieve weight management through diet and physical activity; current treatment: Rybelsus 3 mg daily  Hx Ozempic - burning/irritation at injection site Has not weighed at home since starting Rybelsus. Clothes do seem to fit a little looser. Baseline weight: 226 lbs Current meal patterns: breakfast: Premier protein shake, sometimes egg, vegetable omelette; lunch: tomato sandwich w/ mustard (45 cal Terrill Mohr bread); dinner: grilled chicken salad; husband has a heart healthy, renal healthy, DM diet. Using Weight [employer]chers to monitor diet Current exercise: limited by pain Recommend to increase Rybelsus to 7 mg daily. Script sent to pharmacy. Counseled on risk of increased GI side effects.  Can pursue patient assistance moving forward, once stable dose is achieved.   Hyperlipidemia: Uncontrolled but anticipated to be improved; current treatment: atorvastatin 40 mg daily Previously recommended to continue current regimen at this time. Follow lipid panel in ~ 3 months  Depression/Anxiety with Peripheral Neuropathy and Chronic Pain: Improving; current treatment:  escitalopram 10 mg daily, bupropion XL 150 mg daily, alprazolam 0.5 mg QPM, amitriptyline 75 mg QPM, gabapentin 300 mg QPM, methocarbamol 500 mg TID PRN - taking QAM and QPM, meloxicam 7.5 mg PRN, every other day Intolerant of higher doses of gabapentin d/t LEE Monitor for concerns w/ sedation as patient agents with use of amitriptyline, gabapentin, and alprazolam QPM Monitor for concerns w/ serotonin syndrome with use of amitriptyline, bupropion, and escitalopram if additional serotonergic agents are added.  Osteopenia:   Supplementation: has a few weeks remaining of Vitamin D 50,000 units. Discussed recent DEXA results. Discussed daily calcium intake of ~ 1200 mg and Vitamin D [phone] units. Patient gets at least 1 dairy serving daily. Advised calcium citrate (due to concurrent PPI) 600 mg + Vitamin D [phone] units daily. She has a bottle of Caltrate of her mother's that she will take. Advised to switch from carbonate to citrate moving forward.   Patient Goals/Self-Care Activities Over the next 90 days, patient will:  - take medications as prescribed collaborate with provider on medication access solutions engage in dietary modifications by moderating portion sizes      Plan: Telephone follow up appointment with care management team member scheduled for:  6 weeks  Catie Feliz Beam, PharmD, [place], CPP Clinical Pharmacist [employer] at [employer] [phone]

## 2021-08-25 ENCOUNTER — Other Ambulatory Visit: Payer: Self-pay

## 2021-08-28 ENCOUNTER — Other Ambulatory Visit: Payer: Self-pay

## 2021-08-28 ENCOUNTER — Ambulatory Visit (INDEPENDENT_AMBULATORY_CARE_PROVIDER_SITE_OTHER): Payer: PPO

## 2021-08-28 DIAGNOSIS — E538 Deficiency of other specified B group vitamins: Secondary | ICD-10-CM

## 2021-08-28 MED ORDER — CYANOCOBALAMIN 1000 MCG/ML IJ SOLN
1000.0000 ug | Freq: Once | INTRAMUSCULAR | Status: AC
Start: 2021-08-28 — End: 2021-08-28
  Administered 2021-08-28: 1000 ug via INTRAMUSCULAR

## 2021-08-28 NOTE — Progress Notes (Signed)
Patient presented for B 12 injection to right deltoid, patient voiced no concerns nor showed any signs of distress during injection. 

## 2021-09-24 ENCOUNTER — Other Ambulatory Visit: Payer: Self-pay

## 2021-09-29 ENCOUNTER — Other Ambulatory Visit: Payer: Self-pay

## 2021-09-29 ENCOUNTER — Telehealth: Payer: Self-pay | Admitting: Pharmacist

## 2021-09-29 ENCOUNTER — Ambulatory Visit (INDEPENDENT_AMBULATORY_CARE_PROVIDER_SITE_OTHER): Payer: PPO

## 2021-09-29 ENCOUNTER — Ambulatory Visit (INDEPENDENT_AMBULATORY_CARE_PROVIDER_SITE_OTHER): Payer: PPO | Admitting: Pharmacist

## 2021-09-29 VITALS — Wt 214.0 lb

## 2021-09-29 DIAGNOSIS — E1169 Type 2 diabetes mellitus with other specified complication: Secondary | ICD-10-CM

## 2021-09-29 DIAGNOSIS — I10 Essential (primary) hypertension: Secondary | ICD-10-CM

## 2021-09-29 DIAGNOSIS — E559 Vitamin D deficiency, unspecified: Secondary | ICD-10-CM

## 2021-09-29 DIAGNOSIS — E538 Deficiency of other specified B group vitamins: Secondary | ICD-10-CM

## 2021-09-29 DIAGNOSIS — E669 Obesity, unspecified: Secondary | ICD-10-CM

## 2021-09-29 DIAGNOSIS — I152 Hypertension secondary to endocrine disorders: Secondary | ICD-10-CM

## 2021-09-29 MED ORDER — RYBELSUS 14 MG PO TABS: 14.0000 mg | ORAL_TABLET | Freq: Every day | ORAL | 1 refills | Status: DC

## 2021-09-29 MED ORDER — CYANOCOBALAMIN 1000 MCG/ML IJ SOLN
1000.0000 ug | Freq: Once | INTRAMUSCULAR | Status: AC
  Administered 2021-09-29: 1000 ug via INTRAMUSCULAR

## 2021-09-29 NOTE — Telephone Encounter (Signed)
**Note Sheri-Identified via Obfuscation** Medication Samples have been labeled and logged for the patient.  Drug name: Rybelsus       Strength: 14 mg        Qty: 2  LOT: [id number]  Exp.Date: 01/17/2022  Dosing instructions: Take 1 tablet by mouth daily, first thing in the morning on an empty stomach and wait 30 minutes before other medications or food.   The patient has been instructed regarding the correct time, dose, and frequency of taking this medication, including desired effects and most common side effects.   Sheri [name] 11:55 AM 09/29/2021

## 2021-09-29 NOTE — Patient Instructions (Signed)
Mykael,   Increase Rybelsus to 14 mg daily. We will apply for assistance from the manufacturer.   We recommend you get the updated bivalent COVID-19 booster, at least 2 months after any prior doses. You may consider delaying a booster dose by 3 months from a prior episode of COVID-19 per the CDC.   We recommend the Shingrix (shingles) vaccine series for all over age 66. It should have a $0 copay on all Medicare plans this year. You can pursue this without a prescription at your local pharmacy, or feel free to call our [place] at [facility] at [phone].  Take care!  [name], PharmD  Visit Information  Following are the goals we discussed today:  Over the next 90 days, patient will:  - take medications as prescribed collaborate with provider on medication access solutions engage in dietary modifications by moderating portion sizes        Plan: Telephone follow up appointment with care management team member scheduled for:  6 weeks   [name], PharmD, [place], CPP Clinical Pharmacist [place] at [facility] [phone]     Please call the care guide team at [phone] if you need to cancel or reschedule your appointment.   Patient verbalizes understanding of instructions provided today and agrees to view in [place].

## 2021-09-29 NOTE — Chronic Care Management (AMB) (Signed)
Chronic Care Management CCM Pharmacy Note  09/29/2021 Name:  Sheri [name] MRN:  [id number] DOB:  24-Jun-1956  Summary: - Tolerating regimen wel  Recommendations/Changes made from today's visit: - Increase Rybelsus to 14 mg daily. Pursuing patient assistance - Fasting labs scheduled and ordered - Discussed vaccines  Subjective: Sheri [name] is an 66 y.o. year old female who is a primary patient of Tullo, Mar Daring, MD.  The CCM team was consulted for assistance with disease management and care coordination needs.    Engaged with patient by telephone for follow up visit for pharmacy case management and/or care coordination services.   Objective:  Medications Reviewed Today     Reviewed by Lourena Simmonds, RPH-CPP (Pharmacist) on 08/18/21 at 0955  Med List Status: <None>   Medication Order Taking? Sig Documenting Provider Last Dose Status Informant  ALPRAZolam (XANAX) 0.5 MG tablet [id number] Yes TAKE 1 TABLET BY MOUTH TWICE DAILY AS NEEDED FOR ANXIETY OR SLEEP [name], MD Taking Active   amitriptyline (ELAVIL) 25 MG tablet [id number] Yes TAKE 3 TABLETS (75MG ) BY MOUTH AT BEDTIME FOR NEUROPATHY [name], MD Taking Active   atorvastatin (LIPITOR) 40 MG tablet [id number] Yes Take 1 tablet (40 mg total) by mouth daily. [name], MD Taking Active   buPROPion (WELLBUTRIN XL) 150 MG 24 hr tablet [id number] Yes Take 1 tablet (150 mg total) by mouth daily. [name], MD Taking Active   Calcium Carb-Cholecalciferol 600-20 MG-MCG TABS [id number] Yes Take 1 tablet by mouth daily. [provider] Taking Active   ergocalciferol (VITAMIN D2) 1.25 MG (50000 UT) capsule [id number] Yes Take 1 capsule (50,000 Units total) by mouth once a week. [name], MD Taking Active   escitalopram (LEXAPRO) 10 MG tablet [id number] Yes Take 1 tablet (10 mg total) by mouth daily with breakfast. [name], MD Taking Active   furosemide (LASIX) 20 MG tablet [id number]  Yes TAKE 1 TABLET BY MOUTH ONCE DAILY AS NEEDED FOR FLUID RETENTION [name], MD Taking Active   gabapentin (NEURONTIN) 300 MG capsule [id number] Yes TAKE 1 CAPSULE BY MOUTH ONCE DAILY [name], MD Taking Active   meloxicam (MOBIC) 7.5 MG tablet [id number] Yes TAKE 1 TABLET BY MOUTH ONCE DAILY [name], MD Taking Active   pantoprazole (PROTONIX) 40 MG tablet [id number] Yes Take 1 tablet (40 mg total) by mouth daily. [name], MD Taking Active   Semaglutide [facility]) 3 MG TABS [id number] Yes Take 3 mg by mouth daily. [name], MD Taking Active   tiZANidine (ZANAFLEX) 4 MG tablet [id number] Yes Take 1 tablet (4 mg total) by mouth every 6 (six) hours as needed for muscle spasms. [name], MD Taking Active             Pertinent Labs:   Lab Results  Component Value Date   HGBA1C 6.2 06/29/2021   Lab Results  Component Value Date   CHOL 223 (H) 06/29/2021   HDL 44.10 06/29/2021   LDLCALC 137 (H) 12/22/2020   LDLDIRECT 151.0 06/29/2021   TRIG 238.0 (H) 06/29/2021   CHOLHDL 5 06/29/2021   Lab Results  Component Value Date   CREATININE 0.64 06/29/2021   BUN 16 06/29/2021   NA 136 06/29/2021   K 4.2 06/29/2021   CL 104 06/29/2021   CO2 24 06/29/2021    SDOH:  (Social Determinants of Health) assessments and interventions performed:  SDOH Interventions    Flowsheet  Row Most Recent Value  SDOH Interventions   Financial Strain Interventions Other (Comment)  [manufacturer assistance]       CCM Care Plan  Review of patient past medical history, allergies, medications, health status, including review of consultants reports, laboratory and other test data, was performed as part of comprehensive evaluation and provision of chronic care management services.   Care Plan : Medication Management  Updates made by Lourena Simmonds, RPH-CPP since 09/29/2021 12:00 AM     Problem: Obesity, Diabetes, HTN, Chronic Pain, Depression      Long-Range  Goal: Disease Progression Prevention   Start Date: 02/06/2021  Recent Progress: On track  Priority: High  Note:   Current Barriers:  Unable to achieve control of weight  Suboptimal therapeutic regimen for pain management  Pharmacist Clinical Goal(s):  Over the next 90 days, patient will achieve improvement in weight Over the next 90 days, patient will adhere to plan to optimize therapeutic regimen for pain as evidenced by report of adherence to recommended medication management changes through collaboration with PharmD and provider.   Interventions: 1:1 collaboration with [name], MD regarding development and update of comprehensive plan of care as evidenced by provider attestation and co-signature Inter-disciplinary care team collaboration (see longitudinal plan of care) Comprehensive medication review performed; medication list updated in electronic medical record  Health Maintenance   Yearly diabetic eye exam: up to date Yearly diabetic foot exam: up to date Urine microalbumin: up to date Yearly influenza vaccination: up to date Td/Tdap vaccination: up to date Pneumonia vaccination: up to date COVID vaccinations: due - recommended to pursue Shingrix vaccinations: due for second dose - recommended to pursue Colonoscopy: up to date Bone density scan: up to date Mammogram: up to date  Diabetes in the setting of Obesity: Unable to achieve weight management through diet and physical activity; current treatment: Rybelsus 7 mg daily Hx Ozempic - burning/irritation at injection site Hx metformin - no  Baseline weight: 226 lbs; most recent weight: 214 lbs Current meal patterns: breakfast: Using Weight Watchers to monitor diet Current exercise: limited by pain Recommend to increase Rybelsus to 14 mg daily. Concerned about cost. Discussed income, she qualifies for patient assistance. Will provide Rybelsus sample to tide patient over until PAP can be completed.    Hyperlipidemia: Uncontrolled but anticipated to be improved; current treatment: atorvastatin 40 mg daily (increased ~ 3 months ago) Denies any side effects since dose increase. Denies nonadherence prior to last lipid panel.  Recommended to continue current regimen at this time. Follow lipid panel in ~ 3 months with PCP visit   Depression/Anxiety with Peripheral Neuropathy and Chronic Pain: Improving; current treatment: escitalopram 10 mg daily, bupropion XL 150 mg daily, alprazolam 0.5 mg QPM, amitriptyline 75 mg QPM, gabapentin 300 mg QPM, methocarbamol 500 mg TID PRN - taking QAM and QPM, meloxicam 7.5 mg PRN, every other day Intolerant of higher doses of gabapentin d/t LEE Monitor for concerns w/ sedation as patient agents with use of amitriptyline, gabapentin, and alprazolam QPM Monitor for concerns w/ serotonin syndrome with use of amitriptyline, bupropion, and escitalopram if additional serotonergic agents are added.  Osteopenia:   Calcium intake of ~ 1200 mg and Vitamin D [phone] units. Advised to switch from carbonate to citrate moving forward.   Patient Goals/Self-Care Activities Over the next 90 days, patient will:  - take medications as prescribed collaborate with provider on medication access solutions engage in dietary modifications by moderating portion sizes  Plan: Telephone follow up appointment with care management team member scheduled for:  6 weeks  Catie Feliz Beam, PharmD, [place], CPP Clinical Pharmacist [place] at [employer] [phone]

## 2021-09-29 NOTE — Progress Notes (Signed)
Pt presented today for b12 injection. Left Deltoid. Pt tolerated injection well.

## 2021-10-05 ENCOUNTER — Telehealth: Payer: Self-pay | Admitting: Internal Medicine

## 2021-10-05 NOTE — Telephone Encounter (Signed)
Disregard incomplete note. See note on 09/29/2020

## 2021-10-05 NOTE — Telephone Encounter (Signed)
Patient called, she has not received a 1099 or anything yet to show proof of retirement income. She does have copies of hers and husband's SSI. She has on the [facility] retirement website, but does not have a printer.   Encouraged to bring copies of SSI and we will submit with that. We will work on providing proof of retirement income if needed.

## 2021-10-05 NOTE — Telephone Encounter (Signed)
Patient called and would like Catie to call her about her

## 2021-10-05 NOTE — Telephone Encounter (Signed)
Patient called and would like Catie to call her about the assistants paper work on the [place].

## 2021-10-13 ENCOUNTER — Telehealth: Payer: Self-pay | Admitting: Pharmacy Technician

## 2021-10-13 DIAGNOSIS — Z596 Low income: Secondary | ICD-10-CM

## 2021-10-13 NOTE — Progress Notes (Signed)
[place] [facility])                                            [place] Team    10/13/2021  Sheri [name] Sep 05, 1956 [id number]                                      Medication Assistance Referral  Referral From: [facility] Embedded RPh Catie T.   Medication/Company: Rybelsus / [employer] Patient application portion: Gaffer portion: Interoffice Mailed to Dr. Derrel Nip Provider address/fax verified via: Office website  Received both patient and provider portion(s) of patient assistance application(s) for Rybelsus. Faxed completed application and required documents into [employer].  [name] P. [place], [place]  [phone]

## 2021-10-20 DIAGNOSIS — E1159 Type 2 diabetes mellitus with other circulatory complications: Secondary | ICD-10-CM | POA: Diagnosis not present

## 2021-10-20 DIAGNOSIS — E669 Obesity, unspecified: Secondary | ICD-10-CM

## 2021-10-20 DIAGNOSIS — E1169 Type 2 diabetes mellitus with other specified complication: Secondary | ICD-10-CM

## 2021-10-20 DIAGNOSIS — I152 Hypertension secondary to endocrine disorders: Secondary | ICD-10-CM | POA: Diagnosis not present

## 2021-10-20 DIAGNOSIS — I10 Essential (primary) hypertension: Secondary | ICD-10-CM

## 2021-10-20 DIAGNOSIS — E785 Hyperlipidemia, unspecified: Secondary | ICD-10-CM

## 2021-10-29 ENCOUNTER — Telehealth: Payer: Self-pay | Admitting: Pharmacy Technician

## 2021-10-29 DIAGNOSIS — Z596 Low income: Secondary | ICD-10-CM

## 2021-10-29 NOTE — Progress Notes (Signed)
[place] [facility])                                            [place] Team    10/29/2021  [place] 03/08/1956 [id number]  Care coordination call placed to [place] in regard to Rybelsus application.  Spoke to [place] who informs patient is APPROVED 10/21/2021-09/19/2022. She informs medication should ship by 11/15/21 with delivery to the provider's office.  Greenlee Ancheta P. [place], [place]  [phone]

## 2021-10-30 ENCOUNTER — Other Ambulatory Visit: Payer: Self-pay

## 2021-11-02 ENCOUNTER — Other Ambulatory Visit: Payer: Self-pay

## 2021-11-02 ENCOUNTER — Ambulatory Visit (INDEPENDENT_AMBULATORY_CARE_PROVIDER_SITE_OTHER): Payer: PPO

## 2021-11-02 DIAGNOSIS — E538 Deficiency of other specified B group vitamins: Secondary | ICD-10-CM | POA: Diagnosis not present

## 2021-11-02 MED ORDER — CYANOCOBALAMIN 1000 MCG/ML IJ SOLN
1000.0000 ug | INTRAMUSCULAR | Status: AC
  Administered 2021-11-02 – 2022-12-13 (×3): 1000 ug via INTRAMUSCULAR

## 2021-11-02 NOTE — Progress Notes (Signed)
After obtaining consent, and per orders of Dr. Derrel Nip, injection of b12 given by Ivin Booty in right arm patient tollerated well

## 2021-11-11 ENCOUNTER — Other Ambulatory Visit: Payer: Self-pay | Admitting: Internal Medicine

## 2021-11-11 MED ORDER — ALPRAZOLAM 0.5 MG PO TABS
ORAL_TABLET | ORAL | 2 refills | Status: DC
Start: 2021-11-11 — End: 2022-03-25

## 2021-11-11 MED ORDER — GABAPENTIN 300 MG PO CAPS: ORAL_CAPSULE | Freq: Every day | ORAL | 1 refills | Status: DC

## 2021-11-11 NOTE — Telephone Encounter (Signed)
Refilled: 06/29/2021 Last OV: 06/29/2021 Next OV: 01/05/2022

## 2021-11-11 NOTE — Telephone Encounter (Signed)
Patient has changed pharmacy to [facility]. She needs the following refills, ALPRAZolam (XANAX) 0.5 MG tablet, gabapentin (NEURONTIN) 300 MG capsule.

## 2021-11-13 ENCOUNTER — Ambulatory Visit (INDEPENDENT_AMBULATORY_CARE_PROVIDER_SITE_OTHER): Payer: PPO | Admitting: Pharmacist

## 2021-11-13 ENCOUNTER — Other Ambulatory Visit: Payer: Self-pay | Admitting: Internal Medicine

## 2021-11-13 ENCOUNTER — Telehealth: Payer: Self-pay | Admitting: Pharmacist

## 2021-11-13 VITALS — Wt 214.0 lb

## 2021-11-13 DIAGNOSIS — E1169 Type 2 diabetes mellitus with other specified complication: Secondary | ICD-10-CM

## 2021-11-13 DIAGNOSIS — R1310 Dysphagia, unspecified: Secondary | ICD-10-CM

## 2021-11-13 DIAGNOSIS — I152 Hypertension secondary to endocrine disorders: Secondary | ICD-10-CM

## 2021-11-13 DIAGNOSIS — K22719 Barrett's esophagus with dysplasia, unspecified: Secondary | ICD-10-CM

## 2021-11-13 DIAGNOSIS — I10 Essential (primary) hypertension: Secondary | ICD-10-CM

## 2021-11-13 DIAGNOSIS — E1159 Type 2 diabetes mellitus with other circulatory complications: Secondary | ICD-10-CM

## 2021-11-13 DIAGNOSIS — E785 Hyperlipidemia, unspecified: Secondary | ICD-10-CM

## 2021-11-13 MED ORDER — ATORVASTATIN CALCIUM 40 MG PO TABS: 40.0000 mg | ORAL_TABLET | Freq: Every day | ORAL | 1 refills | Status: DC

## 2021-11-13 NOTE — Telephone Encounter (Signed)
Reports she received a letter from [place] that she was due for repeat endoscopy for Barrett's esophagus (last done 05/2018, recommended to repeat in 2022), however, she has been wanting to transfer care to specialists within the [facility] system. Requests referral to [facility]. Also reports when she eats beef or big pieces of other meats that she doesn't cut up well, she does have some choking/swallowing concerns.   Routing to PCP for referral.

## 2021-11-13 NOTE — Telephone Encounter (Signed)
Patient is aware 

## 2021-11-13 NOTE — Chronic Care Management (AMB) (Signed)
Chronic Care Management CCM Pharmacy Note  11/13/2021 Name:  Abbrielle Rentas MRN:  [id number] DOB:  20-Oct-1955  Summary: - Tolerating regimen. Some constipation - Interested in referral to  GI for f/u endoscopy for Barrett's   Recommendations/Changes made from today's visit: - Continue current regimen. Discussed refill procedure for Rybelsus  Subjective: Cystal Melica Krupnick is an 66 y.o. year old female who is a primary patient of Tullo, Mar Daring, MD.  The CCM team was consulted for assistance with disease management and care coordination needs.    Engaged with patient by telephone for follow up visit for pharmacy case management and/or care coordination services.   Objective:  Medications Reviewed Today     Reviewed by Lourena Simmonds, RPH-CPP (Pharmacist) on 11/13/21 at 1145  Med List Status: <None>   Medication Order Taking? Sig Documenting Provider Last Dose Status Informant  ALPRAZolam (XANAX) 0.5 MG tablet [id number] Yes TAKE 1 TABLET BY MOUTH TWICE DAILY AS NEEDED FOR ANXIETY OR SLEEP [name], MD Taking Active   amitriptyline (ELAVIL) 25 MG tablet [id number] Yes TAKE 3 TABLETS (75MG ) BY MOUTH AT BEDTIME FOR NEUROPATHY [name], MD Taking Active   atorvastatin (LIPITOR) 40 MG tablet [id number] Yes Take 1 tablet (40 mg total) by mouth daily. [name], MD Taking Active   buPROPion (WELLBUTRIN XL) 150 MG 24 hr tablet [id number] Yes Take 1 tablet (150 mg total) by mouth daily. [name], MD Taking Active   Calcium Carb-Cholecalciferol 600-20 MG-MCG TABS [id number] Yes Take 1 tablet by mouth daily. [provider] Taking Active   cyanocobalamin ((VITAMIN B-12)) injection 1,000 mcg [id number]   [name], MD  Consider Medication Status and Discontinue (Completed Course)   escitalopram (LEXAPRO) 10 MG tablet [id number] Yes Take 1 tablet (10 mg total) by mouth daily with breakfast. [name], MD Taking Active   furosemide (LASIX) 20 MG  tablet [id number] Yes TAKE 1 TABLET BY MOUTH ONCE DAILY AS NEEDED FOR FLUID RETENTION [name], MD Taking Active   gabapentin (NEURONTIN) 300 MG capsule [id number] Yes TAKE 1 CAPSULE BY MOUTH ONCE DAILY [name], MD Taking Active   meloxicam (MOBIC) 7.5 MG tablet [id number] Yes TAKE 1 TABLET BY MOUTH ONCE DAILY [name], MD Taking Active   pantoprazole (PROTONIX) 40 MG tablet [id number] Yes Take 1 tablet (40 mg total) by mouth daily. [name], MD Taking Active   Semaglutide [facility]) 14 MG TABS [id number] Yes Take 14 mg by mouth daily. [name], MD Taking Active   tiZANidine (ZANAFLEX) 4 MG tablet [id number] Yes Take 1 tablet (4 mg total) by mouth every 6 (six) hours as needed for muscle spasms. [name], MD Taking Active             Pertinent Labs:   Lab Results  Component Value Date   HGBA1C 6.2 06/29/2021   Lab Results  Component Value Date   CHOL 223 (H) 06/29/2021   HDL 44.10 06/29/2021   LDLCALC 137 (H) 12/22/2020   LDLDIRECT 151.0 06/29/2021   TRIG 238.0 (H) 06/29/2021   CHOLHDL 5 06/29/2021   Lab Results  Component Value Date   CREATININE 0.64 06/29/2021   BUN 16 06/29/2021   NA 136 06/29/2021   K 4.2 06/29/2021   CL 104 06/29/2021   CO2 24 06/29/2021    SDOH:  (Social Determinants of Health) assessments and interventions performed:  SDOH Interventions    Flowsheet Row Most  Recent Value  SDOH Interventions   Financial Strain Interventions Other (Comment)  [manufacturer assistance]       CCM Care Plan  Review of patient past medical history, allergies, medications, health status, including review of consultants reports, laboratory and other test data, was performed as part of comprehensive evaluation and provision of chronic care management services.   Care Plan : Medication Management  Updates made by Lourena Simmonds, RPH-CPP since 11/13/2021 12:00 AM     Problem: Obesity, Diabetes, HTN, Chronic Pain, Depression       Long-Range Goal: Disease Progression Prevention   Start Date: 02/06/2021  Recent Progress: On track  Priority: High  Note:   Current Barriers:  Unable to achieve control of weight  Suboptimal therapeutic regimen for pain management  Pharmacist Clinical Goal(s):  Over the next 90 days, patient will achieve improvement in weight Over the next 90 days, patient will adhere to plan to optimize therapeutic regimen for pain as evidenced by report of adherence to recommended medication management changes through collaboration with PharmD and provider.   Interventions: 1:1 collaboration with [name], MD regarding development and update of comprehensive plan of care as evidenced by provider attestation and co-signature Inter-disciplinary care team collaboration (see longitudinal plan of care) Comprehensive medication review performed; medication list updated in electronic medical record  Health Maintenance   Yearly diabetic eye exam: up to date Yearly diabetic foot exam: up to date Urine microalbumin: up to date Yearly influenza vaccination: up to date Td/Tdap vaccination: up to date Pneumonia vaccination: up to date COVID vaccinations: due - recommended to pursue Shingrix vaccinations: up to date  Colonoscopy: up to date Bone density scan: up to date Mammogram: up to date  Diabetes in the setting of Obesity: Unable to achieve weight management through diet and physical activity; current treatment: Rybelsus 14 mg daily Reports constipation.  Hx Ozempic - burning/irritation at injection site Hx metformin - GI side effects, though appears this was on IR formulation. Unclear if XR formulation has been tried.  Baseline weight: 226 lbs; most recent weight: 214 lbs (5% weight loss from baseline) Current meal patterns: Using Weight Watchers to monitor diet; breakfast: light english muffin w/ scrambled egg, canadian bacon, cheese; sometimes multigrain cheerios w/ almond milk; supper:  chicken breast, green beans; drinks: water and coffee Current exercise: limited by pain Recommend to continue current regimen at this time. Encouraged increased hydration, fiber, physical activity. Discussed stool softener/Miralax if needed.  Recommended to continue current regimen at this time.   Hyperlipidemia: Uncontrolled but anticipated to be improved; current treatment: atorvastatin 40 mg daily (increased ~ 3 months ago) Denies any side effects since dose increase. Denies nonadherence prior to last lipid panel.  Recommended to continue current regimen at this time. Follow lipid panel scheduled with next PCP visit.  Depression/Anxiety with Peripheral Neuropathy and Chronic Pain: Improving; current treatment: escitalopram 10 mg daily, bupropion XL 150 mg daily, alprazolam 0.5 mg QPM, amitriptyline 75 mg QPM, gabapentin 300 mg QPM, methocarbamol 500 mg TID PRN - taking QAM and QPM, meloxicam 7.5 mg PRN, every other day Intolerant of higher doses of gabapentin d/t LEE Monitor for concerns w/ sedation as patient agents with use of amitriptyline, gabapentin, and alprazolam QPM Monitor for concerns w/ serotonin syndrome with use of amitriptyline, bupropion, and escitalopram if additional serotonergic agents are added.  Osteopenia:   Calcium intake of ~ 1200 mg and Vitamin D [phone] units.  Patient Goals/Self-Care Activities Over the next 90 days, patient will:  -  take medications as prescribed collaborate with provider on medication access solutions engage in dietary modifications by moderating portion sizes      Plan: Telephone follow up appointment with care management team member scheduled for:  5 months  Catie Feliz Beam, PharmD, [place], CPP Clinical Pharmacist [employer] at [employer] [phone]

## 2021-11-13 NOTE — Patient Instructions (Signed)
Sheri [name],   It was great talking to you today!  [employer] Patient Assistance Program for Rybelsus has an auto-refill program where you do not need to call and request a refill each time. You should receive your medication shipment before you run out of your medication. However, if you have any issues or need assistance, you can call them at [phone]. They are available Monday though Friday 8:00 AM - 8:00 PM. If you have not heard that we have received your first order in 2 weeks, please give them a call to follow up on the status of the shipment.   We recommend you get the updated bivalent COVID-19 booster, at least 2 months after any prior doses. You may consider delaying a booster dose by 3 months from a prior episode of COVID-19 per the CDC.   You can find pharmacies that have this formulation in stock at [email or web address]   Take care!  Sheri [name], PharmD  Visit Information  Following are the goals we discussed today:  Patient Goals/Self-Care Activities Over the next 90 days, patient will:  - take medications as prescribed collaborate with provider on medication access solutions engage in dietary modifications by moderating portion sizes        Plan: Telephone follow up appointment with care management team member scheduled for:  5 months   Sheri [name], PharmD, Sheri [name], CPP Clinical Pharmacist [place] at [facility] [phone]   Please call the care guide team at [phone] if you need to cancel or reschedule your appointment.   Patient verbalizes understanding of instructions and care plan provided today and agrees to view in [place]. Active MyChart status confirmed with patient.

## 2021-11-16 ENCOUNTER — Other Ambulatory Visit: Payer: Self-pay

## 2021-11-16 ENCOUNTER — Telehealth: Payer: Self-pay

## 2021-11-16 NOTE — Telephone Encounter (Signed)
Scheduled for 02/02/2022

## 2021-11-16 NOTE — Telephone Encounter (Signed)
CALLED PATIENT NO ANSWER LEFT VOICEMAIL FOR A CALL BACK ? ?

## 2021-11-17 DIAGNOSIS — E785 Hyperlipidemia, unspecified: Secondary | ICD-10-CM | POA: Diagnosis not present

## 2021-11-17 DIAGNOSIS — I10 Essential (primary) hypertension: Secondary | ICD-10-CM | POA: Diagnosis not present

## 2021-11-17 DIAGNOSIS — E669 Obesity, unspecified: Secondary | ICD-10-CM

## 2021-11-17 DIAGNOSIS — E1159 Type 2 diabetes mellitus with other circulatory complications: Secondary | ICD-10-CM

## 2021-11-17 DIAGNOSIS — I152 Hypertension secondary to endocrine disorders: Secondary | ICD-10-CM

## 2021-11-17 DIAGNOSIS — E1169 Type 2 diabetes mellitus with other specified complication: Secondary | ICD-10-CM

## 2021-11-24 ENCOUNTER — Ambulatory Visit: Payer: Self-pay | Admitting: Pharmacist

## 2021-11-24 NOTE — Chronic Care Management (AMB) (Signed)
?  Chronic Care Management  ? ?Note ? ?11/24/2021 ?Name: Sheri [name] MRN: [id number] DOB: 1956-01-11 ? ? ? ?Closing pharmacy CCM case at this time.  Patient has clinic contact information for future questions or concerns.  ? ?Catie Feliz Beam, PharmD, [place], CPP ?Clinical Pharmacist ?Nature conservation officer at [employer] ?[phone] ? ?

## 2021-11-24 NOTE — Patient Instructions (Signed)
Hi Zella,  ? ?I am being asked to quickly transition into another role within the health system, so I am unable to keep our next appointment in July. Please continue to follow up with your primary care provider as scheduled.  ? ?It has been a pleasure working with you! ? ?[name], PharmD ? ?

## 2021-12-01 ENCOUNTER — Other Ambulatory Visit: Payer: Self-pay

## 2021-12-01 ENCOUNTER — Ambulatory Visit (INDEPENDENT_AMBULATORY_CARE_PROVIDER_SITE_OTHER): Payer: PPO

## 2021-12-01 DIAGNOSIS — E538 Deficiency of other specified B group vitamins: Secondary | ICD-10-CM | POA: Diagnosis not present

## 2021-12-01 MED ORDER — CYANOCOBALAMIN 1000 MCG/ML IJ SOLN
1000.0000 ug | Freq: Once | INTRAMUSCULAR | Status: AC
  Administered 2021-12-01: 1000 ug via INTRAMUSCULAR

## 2021-12-01 NOTE — Progress Notes (Signed)
Pt arrived for B12 injection, given in L deltoid. Pt tolerated injection well, showed no signs of distress nor voiced any concerns.  ?

## 2021-12-02 ENCOUNTER — Telehealth: Payer: Self-pay

## 2021-12-02 NOTE — Telephone Encounter (Signed)
Lm for pt - Rybelsus is here and ready for pick up ?

## 2021-12-03 NOTE — Telephone Encounter (Signed)
Patient picked up medication

## 2021-12-03 NOTE — Telephone Encounter (Signed)
Patient coming to pick up medication in 30 minutes on my desk. ?

## 2021-12-21 ENCOUNTER — Other Ambulatory Visit: Payer: Self-pay

## 2021-12-21 ENCOUNTER — Other Ambulatory Visit: Payer: Self-pay | Admitting: Internal Medicine

## 2021-12-21 MED ORDER — BUPROPION HCL ER (XL) 150 MG PO TB24: 150.0000 mg | ORAL_TABLET | Freq: Every day | ORAL | 1 refills | Status: DC

## 2021-12-21 MED ORDER — PANTOPRAZOLE SODIUM 40 MG PO TBEC: 40.0000 mg | DELAYED_RELEASE_TABLET | Freq: Every day | ORAL | 1 refills | Status: DC

## 2021-12-21 MED ORDER — ESCITALOPRAM OXALATE 10 MG PO TABS: 10.0000 mg | ORAL_TABLET | Freq: Every day | ORAL | 1 refills | Status: DC

## 2021-12-23 ENCOUNTER — Other Ambulatory Visit: Payer: Self-pay

## 2021-12-23 MED ORDER — BUPROPION HCL ER (XL) 150 MG PO TB24: 150.0000 mg | ORAL_TABLET | Freq: Every day | ORAL | 1 refills | Status: DC

## 2021-12-23 MED ORDER — PANTOPRAZOLE SODIUM 40 MG PO TBEC: 40.0000 mg | DELAYED_RELEASE_TABLET | Freq: Every day | ORAL | 1 refills | Status: DC

## 2022-01-01 ENCOUNTER — Ambulatory Visit (INDEPENDENT_AMBULATORY_CARE_PROVIDER_SITE_OTHER): Payer: PPO

## 2022-01-01 ENCOUNTER — Other Ambulatory Visit: Payer: PPO

## 2022-01-01 DIAGNOSIS — E538 Deficiency of other specified B group vitamins: Secondary | ICD-10-CM | POA: Diagnosis not present

## 2022-01-01 DIAGNOSIS — E669 Obesity, unspecified: Secondary | ICD-10-CM | POA: Diagnosis not present

## 2022-01-01 DIAGNOSIS — E559 Vitamin D deficiency, unspecified: Secondary | ICD-10-CM | POA: Diagnosis not present

## 2022-01-01 DIAGNOSIS — E1169 Type 2 diabetes mellitus with other specified complication: Secondary | ICD-10-CM | POA: Diagnosis not present

## 2022-01-01 DIAGNOSIS — E1159 Type 2 diabetes mellitus with other circulatory complications: Secondary | ICD-10-CM | POA: Diagnosis not present

## 2022-01-01 DIAGNOSIS — E785 Hyperlipidemia, unspecified: Secondary | ICD-10-CM | POA: Diagnosis not present

## 2022-01-01 DIAGNOSIS — I10 Essential (primary) hypertension: Secondary | ICD-10-CM

## 2022-01-01 LAB — VITAMIN D 25 HYDROXY (VIT D DEFICIENCY, FRACTURES): VITD: 27.37 ng/mL — ABNORMAL LOW (ref 30.00–100.00)

## 2022-01-01 LAB — LIPID PANEL
Cholesterol: 126 mg/dL (ref 0–200)
HDL: 35.8 mg/dL — ABNORMAL LOW (ref >39.00)
LDL Cholesterol: 57 mg/dL (ref 0–99)
NonHDL: 89.93
Total CHOL/HDL Ratio: 4
Triglycerides: 163 mg/dL — ABNORMAL HIGH (ref 0.0–149.0)
VLDL: 32.6 mg/dL (ref 0.0–40.0)

## 2022-01-01 LAB — COMPREHENSIVE METABOLIC PANEL
ALT: 22 U/L (ref 0–35)
AST: 20 U/L (ref 0–37)
Albumin: 4.6 g/dL (ref 3.5–5.2)
Alkaline Phosphatase: 86 U/L (ref 39–117)
BUN: 19 mg/dL (ref 6–23)
CO2: 21 mEq/L (ref 19–32)
Calcium: 9.5 mg/dL (ref 8.4–10.5)
Chloride: 103 mEq/L (ref 96–112)
Creatinine, Ser: 0.66 mg/dL (ref 0.40–1.20)
GFR: 91.84 mL/min (ref >60.00)
Glucose, Bld: 101 mg/dL — ABNORMAL HIGH (ref 70–99)
Potassium: 4 mEq/L (ref 3.5–5.1)
Sodium: 134 mEq/L — ABNORMAL LOW (ref 135–145)
Total Bilirubin: 1.2 mg/dL (ref 0.2–1.2)
Total Protein: 6.7 g/dL (ref 6.0–8.3)

## 2022-01-01 LAB — HEMOGLOBIN A1C: Hgb A1c MFr Bld: 5.6 % (ref 4.6–6.5)

## 2022-01-01 MED ORDER — CYANOCOBALAMIN 1000 MCG/ML IJ SOLN
1000.0000 ug | Freq: Once | INTRAMUSCULAR | Status: AC
  Administered 2022-01-01: 1000 ug via INTRAMUSCULAR

## 2022-01-01 NOTE — Progress Notes (Signed)
Patient presented for B 12 injection to left deltoid, patient voiced no concerns nor showed any signs of distress during injection. 

## 2022-01-05 ENCOUNTER — Ambulatory Visit (INDEPENDENT_AMBULATORY_CARE_PROVIDER_SITE_OTHER): Payer: PPO

## 2022-01-05 ENCOUNTER — Encounter: Payer: Self-pay | Admitting: Internal Medicine

## 2022-01-05 ENCOUNTER — Ambulatory Visit (INDEPENDENT_AMBULATORY_CARE_PROVIDER_SITE_OTHER): Payer: PPO | Admitting: Internal Medicine

## 2022-01-05 VITALS — BP 126/80 | HR 109 | Temp 98.3°F | Ht 63.0 in | Wt 210.0 lb

## 2022-01-05 DIAGNOSIS — I471 Supraventricular tachycardia, unspecified: Secondary | ICD-10-CM | POA: Insufficient documentation

## 2022-01-05 DIAGNOSIS — Z6839 Body mass index (BMI) 39.0-39.9, adult: Secondary | ICD-10-CM | POA: Diagnosis not present

## 2022-01-05 DIAGNOSIS — K2270 Barrett's esophagus without dysplasia: Secondary | ICD-10-CM | POA: Diagnosis not present

## 2022-01-05 DIAGNOSIS — G8929 Other chronic pain: Secondary | ICD-10-CM | POA: Diagnosis not present

## 2022-01-05 DIAGNOSIS — I152 Hypertension secondary to endocrine disorders: Secondary | ICD-10-CM

## 2022-01-05 DIAGNOSIS — E785 Hyperlipidemia, unspecified: Secondary | ICD-10-CM

## 2022-01-05 DIAGNOSIS — M25551 Pain in right hip: Secondary | ICD-10-CM

## 2022-01-05 DIAGNOSIS — E1169 Type 2 diabetes mellitus with other specified complication: Secondary | ICD-10-CM

## 2022-01-05 DIAGNOSIS — E669 Obesity, unspecified: Secondary | ICD-10-CM

## 2022-01-05 DIAGNOSIS — F320 Major depressive disorder, single episode, mild: Secondary | ICD-10-CM

## 2022-01-05 DIAGNOSIS — E66812 Obesity, class 2: Secondary | ICD-10-CM | POA: Insufficient documentation

## 2022-01-05 DIAGNOSIS — M4698 Unspecified inflammatory spondylopathy, sacral and sacrococcygeal region: Secondary | ICD-10-CM | POA: Diagnosis not present

## 2022-01-05 DIAGNOSIS — E1159 Type 2 diabetes mellitus with other circulatory complications: Secondary | ICD-10-CM | POA: Diagnosis not present

## 2022-01-05 DIAGNOSIS — M1611 Unilateral primary osteoarthritis, right hip: Secondary | ICD-10-CM | POA: Diagnosis not present

## 2022-01-05 MED ORDER — AMITRIPTYLINE HCL 25 MG PO TABS: ORAL_TABLET | ORAL | 1 refills | Status: DC

## 2022-01-05 NOTE — Assessment & Plan Note (Signed)
Brought on by walking.  Straight leg lift negative. Plain films ordered  ?

## 2022-01-05 NOTE — Assessment & Plan Note (Signed)
Glycemic control is excellent and she is losing weight with Rybelsus.  Did not tolerate Ozempic .Encouraged to prioritize time to  . LDL is at goal since increasing atorvastatin  to 40 mg  ? ?Lab Results  ?Component Value Date  ? HGBA1C 5.6 01/01/2022  ? ?Lab Results  ?Component Value Date  ? LABMICR 14.9 05/30/2018  ? MICROALBUR 1.5 07/14/2021  ? MICROALBUR <0.7 06/23/2020  ? ? ? ? ?Lab Results  ?Component Value Date  ? CHOL 126 01/01/2022  ? HDL 35.80 (L) 01/01/2022  ? [place] 57 01/01/2022  ? LDLDIRECT 151.0 06/29/2021  ? TRIG 163.0 (H) 01/01/2022  ? CHOLHDL 4 01/01/2022  ? ? ? ? ? ? ?

## 2022-01-05 NOTE — Patient Instructions (Signed)
Your diabetes is under excellent control currently. You have lowered your a1c  from 6.2. to 5.6 !   Please plan to return in 6  months for follow up on diabetes.   Fasting labs need to be done a day or two prior to visit so we can discuss them at your visit. Also, if you have not done so,  make sure you are seeing your eye doctor at least once a year. ?  ? ?I don't think your right sided pain is from your back; I think it's your hip.  Plain films today will help ? ?I'm also going to request [facility] MRI  ? ? ?Try pre treating yourself before exercise with 1000 mg tylenol  ?

## 2022-01-05 NOTE — Progress Notes (Signed)
pain, sore throat, dysphagia,  hemoptysis , cough, dyspnea, wheezing, chest pain, palpitations, orthopnea, edema, abdominal pain, nausea, melena, diarrhea, constipation, flank pain, dysuria, hematuria, urinary  Frequency, nocturia, numbness, tingling, seizures,  Focal weakness, Loss of consciousness,  Tremor, insomnia, depression, anxiety, and suicidal ideation.   ? ? ? ?Objective:  ?BP 126/80 (BP Location: Left Arm, Patient Position: Sitting, Cuff Size: Large)   Pulse (!) 109   Temp 98.3 ?F (36.8 ?C) (Oral)   Ht '5\' 3"'$  (1.6 m)   Wt  210 lb (95.3 kg)   SpO2 97%   BMI 37.20 kg/m?  ? ?BP Readings from Last 3 Encounters:  ?01/05/22 126/80  ?06/29/21 120/88  ?03/27/21 (!) 134/96  ? ? ?Wt Readings from Last 3 Encounters:  ?01/05/22 210 lb (95.3 kg)  ?11/13/21 214 lb (97.1 kg)  ?09/29/21 214 lb (97.1 kg)  ? ? ?General appearance: alert, cooperative and appears stated age ?Ears: normal TM's and external ear canals both ears ?Throat: lips, mucosa, and tongue normal; teeth and gums normal ?Neck: no adenopathy, no carotid bruit, supple, symmetrical, trachea midline and thyroid not enlarged, symmetric, no tenderness/mass/nodules ?Back: symmetric, no curvature. ROM normal. No CVA tenderness. ?Lungs: clear to auscultation bilaterally ?Heart: regular rate and rhythm, S1, S2 normal, no murmur, click, rub or gallop ?Abdomen: soft, non-tender; bowel sounds normal; no masses,  no organomegaly ?Pulses: 2+ and symmetric ?Skin: Skin color, texture, turgor normal. No rashes or lesions ?Lymph nodes: Cervical, supraclavicular, and axillary nodes normal. ? ?Lab Results  ?Component Value Date  ? HGBA1C 5.6 01/01/2022  ? HGBA1C 6.2 06/29/2021  ? HGBA1C 5.8 12/22/2020  ? ? ?Lab Results  ?Component Value Date  ? CREATININE 0.66 01/01/2022  ? CREATININE 0.64 06/29/2021  ? CREATININE 0.56 12/22/2020  ? ? ?Lab Results  ?Component Value Date  ? WBC 5.7 06/29/2021  ? HGB 14.5 06/29/2021  ? HCT 42.9 06/29/2021  ? PLT 267.0 06/29/2021  ? GLUCOSE 101 (H) 01/01/2022  ? CHOL 126 01/01/2022  ? TRIG 163.0 (H) 01/01/2022  ? HDL 35.80 (L) 01/01/2022  ? LDLDIRECT 151.0 06/29/2021  ? [place] 57 01/01/2022  ? ALT 22 01/01/2022  ? AST 20 01/01/2022  ? NA 134 (L) 01/01/2022  ? K 4.0 01/01/2022  ? CL 103 01/01/2022  ? CREATININE 0.66 01/01/2022  ? BUN 19 01/01/2022  ? CO2 21 01/01/2022  ? TSH 1.36 12/22/2020  ? HGBA1C 5.6 01/01/2022  ? MICROALBUR 1.5 07/14/2021  ? ? ?DG Bone Density ? ?Result Date: 08/04/2021 ?EXAM: DUAL X-RAY ABSORPTIOMETRY (DXA) FOR BONE MINERAL DENSITY IMPRESSION: Dear  Dr. Derrel Nip, Your patient Sheri [name] completed a FRAX assessment on 08/04/2021 using the [employer] (analysis version: 14.10) manufactured by [employer]. The following summarizes the results of our evaluation. PATIENT BIOGRAPHICAL: Name: Sheri, [name] Patient ID: [id number] Birth Date: 04-24-1956 Height:    63.0 in. Gender:     Female    Age:        65.3       Weight:    225.1 lbs. Ethnicity:  White                            Exam Date: 08/04/2021 FRAX* RESULTS:  (version: 3.5) 10-year Probability of Fracture1 Major Osteoporotic Fracture2 Hip Fracture 7.7% 0.7% Population: [place] (Caucasian) Risk Factors: None Based on Femur (Left) Neck BMD 1 -The 10-year probability of fracture may be lower than reported if the patient has received  treatment. 2 -Major Osteoporotic Fracture: Clinical Spine, Forearm, Hip or Shoulder *FRAX is a Materials engineer of the [employer] for Metabolic Bone Disease, a [place] (WHO) [facility]. ASSESSMENT: The probability of a major osteoporotic fracture is 7.7% within the next ten years. The probability of a hip fracture is 0.7% within the next ten years. . Your patient Sheri [name] completed a BMD test on 08/04/2021 using the [place] (software version: 14.10) manufactured by [employer]. The following summarizes the results of our evaluation. Technologist: [facility] PATIENT BIOGRAPHICAL: Name: Sheri, [name] Patient ID: [id number] Birth Date: 01/16/1956 Height: 63.0 in. Gender: Female Exam Date: 08/04/2021 Weight: 225.1 lbs. Indications: Diabetic, Hysterectomy, Oophorectomy Bilateral, Postmenopausal Fractures: Treatments: RYBELSUS, Vitamin D DENSITOMETRY RESULTS: Site      Region    Measured Date Measured Age WHO Classification Young Adult T-score BMD         %Change vs. Previous Significant Change (*) AP Spine L1-L4 08/04/2021 65.3 Normal -0.1 1.187 g/cm2 DualFemur Neck Left 08/04/2021 65.3 Osteopenia  -1.4 0.850 g/cm2 ASSESSMENT: The BMD measured at Femur Neck Left is 0.850 g/cm2 with a T-score of -1.4. This patient is considered osteopenic according to [facility]) criteria. The scan quality  pain, sore throat, dysphagia,  hemoptysis , cough, dyspnea, wheezing, chest pain, palpitations, orthopnea, edema, abdominal pain, nausea, melena, diarrhea, constipation, flank pain, dysuria, hematuria, urinary  Frequency, nocturia, numbness, tingling, seizures,  Focal weakness, Loss of consciousness,  Tremor, insomnia, depression, anxiety, and suicidal ideation.   ? ? ? ?Objective:  ?BP 126/80 (BP Location: Left Arm, Patient Position: Sitting, Cuff Size: Large)   Pulse (!) 109   Temp 98.3 ?F (36.8 ?C) (Oral)   Ht '5\' 3"'$  (1.6 m)   Wt  210 lb (95.3 kg)   SpO2 97%   BMI 37.20 kg/m?  ? ?BP Readings from Last 3 Encounters:  ?01/05/22 126/80  ?06/29/21 120/88  ?03/27/21 (!) 134/96  ? ? ?Wt Readings from Last 3 Encounters:  ?01/05/22 210 lb (95.3 kg)  ?11/13/21 214 lb (97.1 kg)  ?09/29/21 214 lb (97.1 kg)  ? ? ?General appearance: alert, cooperative and appears stated age ?Ears: normal TM's and external ear canals both ears ?Throat: lips, mucosa, and tongue normal; teeth and gums normal ?Neck: no adenopathy, no carotid bruit, supple, symmetrical, trachea midline and thyroid not enlarged, symmetric, no tenderness/mass/nodules ?Back: symmetric, no curvature. ROM normal. No CVA tenderness. ?Lungs: clear to auscultation bilaterally ?Heart: regular rate and rhythm, S1, S2 normal, no murmur, click, rub or gallop ?Abdomen: soft, non-tender; bowel sounds normal; no masses,  no organomegaly ?Pulses: 2+ and symmetric ?Skin: Skin color, texture, turgor normal. No rashes or lesions ?Lymph nodes: Cervical, supraclavicular, and axillary nodes normal. ? ?Lab Results  ?Component Value Date  ? HGBA1C 5.6 01/01/2022  ? HGBA1C 6.2 06/29/2021  ? HGBA1C 5.8 12/22/2020  ? ? ?Lab Results  ?Component Value Date  ? CREATININE 0.66 01/01/2022  ? CREATININE 0.64 06/29/2021  ? CREATININE 0.56 12/22/2020  ? ? ?Lab Results  ?Component Value Date  ? WBC 5.7 06/29/2021  ? HGB 14.5 06/29/2021  ? HCT 42.9 06/29/2021  ? PLT 267.0 06/29/2021  ? GLUCOSE 101 (H) 01/01/2022  ? CHOL 126 01/01/2022  ? TRIG 163.0 (H) 01/01/2022  ? HDL 35.80 (L) 01/01/2022  ? LDLDIRECT 151.0 06/29/2021  ? [place] 57 01/01/2022  ? ALT 22 01/01/2022  ? AST 20 01/01/2022  ? NA 134 (L) 01/01/2022  ? K 4.0 01/01/2022  ? CL 103 01/01/2022  ? CREATININE 0.66 01/01/2022  ? BUN 19 01/01/2022  ? CO2 21 01/01/2022  ? TSH 1.36 12/22/2020  ? HGBA1C 5.6 01/01/2022  ? MICROALBUR 1.5 07/14/2021  ? ? ?DG Bone Density ? ?Result Date: 08/04/2021 ?EXAM: DUAL X-RAY ABSORPTIOMETRY (DXA) FOR BONE MINERAL DENSITY IMPRESSION: Dear  Dr. Derrel Nip, Your patient Sheri [name] completed a FRAX assessment on 08/04/2021 using the [employer] (analysis version: 14.10) manufactured by [employer]. The following summarizes the results of our evaluation. PATIENT BIOGRAPHICAL: Name: Sheri, [name] Patient ID: [id number] Birth Date: 04-24-1956 Height:    63.0 in. Gender:     Female    Age:        65.3       Weight:    225.1 lbs. Ethnicity:  White                            Exam Date: 08/04/2021 FRAX* RESULTS:  (version: 3.5) 10-year Probability of Fracture1 Major Osteoporotic Fracture2 Hip Fracture 7.7% 0.7% Population: [place] (Caucasian) Risk Factors: None Based on Femur (Left) Neck BMD 1 -The 10-year probability of fracture may be lower than reported if the patient has received

## 2022-01-05 NOTE — Assessment & Plan Note (Signed)
Mood improved with retirement.  continueeu lexapro and wellbutrin xl ?

## 2022-01-05 NOTE — Assessment & Plan Note (Signed)
Glycemic control is fine,  And she is losing weight    She does not have increased appetite;  And Her sense of taste is gradually improving since being  altered by COVID infection ? ?Lab Results  ?Component Value Date  ? HGBA1C 5.6 01/01/2022  ? ?Lab Results  ?Component Value Date  ? LABMICR 14.9 05/30/2018  ? MICROALBUR 1.5 07/14/2021  ? MICROALBUR <0.7 06/23/2020  ? ? ? ? ?

## 2022-01-05 NOTE — Assessment & Plan Note (Signed)
continue acid suppression with PPI pantoprazole ?

## 2022-01-05 NOTE — Assessment & Plan Note (Signed)
We have had difficulty obtaining her eye exam reports from [facility] .  She has monocular contact lenses  (left eye for reading,  Right eye for distance).  Told she had a small cataract  20/200 at last visit with [place] [facility] [place]  ?

## 2022-01-21 ENCOUNTER — Telehealth: Payer: Self-pay

## 2022-01-21 NOTE — Telephone Encounter (Signed)
LMTCB. Need to let pt know that her patient assistance medication is ready for pick up.  ? ?Received patient assistance medication in office.  ? ?Rybelsus 14 mg: 4 boxes ? ? ?

## 2022-02-01 ENCOUNTER — Ambulatory Visit (INDEPENDENT_AMBULATORY_CARE_PROVIDER_SITE_OTHER): Payer: PPO

## 2022-02-01 DIAGNOSIS — E538 Deficiency of other specified B group vitamins: Secondary | ICD-10-CM | POA: Diagnosis not present

## 2022-02-01 MED ORDER — CYANOCOBALAMIN 1000 MCG/ML IJ SOLN
1000.0000 ug | Freq: Once | INTRAMUSCULAR | Status: AC
  Administered 2022-02-01: 1000 ug via INTRAMUSCULAR

## 2022-02-01 NOTE — Progress Notes (Signed)
Patient presented for B 12 injection to left deltoid, patient voiced no concerns nor showed any signs of distress during injection. 

## 2022-02-02 ENCOUNTER — Ambulatory Visit: Payer: PPO | Admitting: Gastroenterology

## 2022-02-02 ENCOUNTER — Other Ambulatory Visit: Payer: Self-pay

## 2022-02-02 ENCOUNTER — Encounter: Payer: Self-pay | Admitting: Gastroenterology

## 2022-02-02 VITALS — BP 122/81 | HR 108 | Temp 98.2°F | Ht 63.0 in | Wt 216.4 lb

## 2022-02-02 DIAGNOSIS — R1310 Dysphagia, unspecified: Secondary | ICD-10-CM | POA: Diagnosis not present

## 2022-02-02 MED ORDER — PANTOPRAZOLE SODIUM 40 MG PO TBEC: 40.0000 mg | DELAYED_RELEASE_TABLET | Freq: Two times a day (BID) | ORAL | 3 refills | Status: DC

## 2022-02-02 NOTE — Progress Notes (Signed)
?  ?Jonathon Bellows MD, MRCP(U.K) ?[address]  ?[address]  ?[place], [place] [address]  ?Main: [phone]  ?Fax: [phone] ? ? ?Gastroenterology Consultation ? ?Referring Provider:     Crecencio Mc, MD ?Primary Care Physician:  Crecencio Mc, MD ?Primary Gastroenterologist:  Dr. Jonathon Bellows  ?Reason for Consultation:     Dysphagia ?      ? HPI:   ?Sheri [name] is a 66 y.o. y/o female referred for consultation & management  by Dr. Crecencio Mc, MD.   ? ?She has been referred for dysphagia.  Previously patient of [facility] clinic gastroenterology last seen in October 2020.  She carries a diagnosis of Barrett's esophagus.  Last EGD on epic in 2019 by Dr. Gustavo Lah demonstrated LA grade C esophagitis hiatal hernia was noted.  Biopsies showed reflux esophagitis no evidence of Barrett's on the report.  She also underwent a colonoscopy at that time that showed diverticulosis of the colon. ? ?Dysphagia: ?Onset and any progression: Few months ?Frequency: Solids such as meat high because a few times a week ?Foods affected : As above ?Prior episodes of impaction: None ?History of asthma/allergy : None ?History of heartburn/Reflux : Yes ?Weight loss/weight gain : None ?PPI/H2 blocker use : Protonix 40 mg once a day ? ? ?Past Medical History:  ?Diagnosis Date  ? Abdominal pain, epigastric   ? Anxiety   ? Back pain   ? Diabetes mellitus, type 2 ([place])   ? Diverticulosis of colon without hemorrhage   ? Duodenitis with hemorrhage   ? Edema   ? Fluid retention   ? GERD (gastroesophageal reflux disease)   ? Heart murmur   ? Hyperlipidemia   ? Hypertension   ? Leg edema   ? Mitral regurgitation   ? Obesity, unspecified   ? Osteopenia   ? Panic disorder without agoraphobia   ? Peripheral neuropathy   ? Personal history of malignant neoplasm of cervix uteri   ? CIN-3 1989, normal since, gets pap smears annually  ? Personal history of other genital system and obstetric disorders(V13.29)   ? Prediabetes   ? Rash and other  nonspecific skin eruption   ? Reflux esophagitis   ? Swallowing difficulty   ? Unspecified hereditary and idiopathic peripheral neuropathy   ? Unspecified sleep apnea   ? sleep study- [facility]  ? Vitamin D deficiency   ? ? ?Past Surgical History:  ?Procedure Laterality Date  ? ABDOMINAL HYSTERECTOMY    ? BREAST CYST ASPIRATION    ? right , benign  ? COLONOSCOPY WITH PROPOFOL N/A 06/09/2018  ? Procedure: COLONOSCOPY WITH PROPOFOL;  Surgeon: Lollie Sails, MD;  Location: [facility] ENDOSCOPY;  Service: Endoscopy;  Laterality: N/A;  ? DILATION AND CURETTAGE OF UTERUS    ? ESOPHAGOGASTRODUODENOSCOPY N/A 06/09/2018  ? Procedure: ESOPHAGOGASTRODUODENOSCOPY (EGD);  Surgeon: Lollie Sails, MD;  Location: [facility] ENDOSCOPY;  Service: Endoscopy;  Laterality: N/A;  ? RIGHT OOPHORECTOMY  2001  ? menopause, endometriosis s/p.  HRT  since- has not tolerated previous attempts to wean  ? ? ?Prior to Admission medications   ?Medication Sig Start Date End Date Taking? Authorizing Provider  ?ALPRAZolam (XANAX) 0.5 MG tablet TAKE 1 TABLET BY MOUTH TWICE DAILY AS NEEDED FOR ANXIETY OR SLEEP 11/11/21 05/10/22  Crecencio Mc, MD  ?amitriptyline (ELAVIL) 25 MG tablet TAKE 3 TABLETS ('75MG'$ ) BY MOUTH AT BEDTIME FOR NEUROPATHY 01/05/22 01/05/23  Crecencio Mc, MD  ?atorvastatin (LIPITOR) 40 MG tablet Take 1 tablet (  40 mg total) by mouth daily. 11/13/21   Crecencio Mc, MD  ?buPROPion (WELLBUTRIN XL) 150 MG 24 hr tablet Take 1 tablet (150 mg total) by mouth daily. 12/23/21   Crecencio Mc, MD  ?Calcium Carb-Cholecalciferol 600-20 MG-MCG TABS Take 1 tablet by mouth daily.    [provider]  ?escitalopram (LEXAPRO) 10 MG tablet Take 1 tablet (10 mg total) by mouth daily with breakfast. 12/21/21   Crecencio Mc, MD  ?furosemide (LASIX) 20 MG tablet TAKE 1 TABLET BY MOUTH ONCE DAILY AS NEEDED FOR FLUID RETENTION 06/18/19   Crecencio Mc, MD  ?gabapentin (NEURONTIN) 300 MG capsule TAKE 1 CAPSULE BY MOUTH ONCE DAILY 11/11/21 11/11/22  Crecencio Mc, MD  ?pantoprazole (PROTONIX) 40 MG tablet Take 1 tablet (40 mg total) by mouth daily. 12/23/21   Crecencio Mc, MD  ?Semaglutide (RYBELSUS) 14 MG TABS Take 14 mg by mouth daily. 09/29/21   Crecencio Mc, MD  ?tiZANidine (ZANAFLEX) 4 MG tablet Take 1 tablet (4 mg total) by mouth every 6 (six) hours as needed for muscle spasms. 06/29/21   Crecencio Mc, MD  ? ? ?Family History  ?Problem Relation Age of Onset  ? Osteopenia Mother   ? Hypothyroidism Mother   ? Diabetes Mother   ? Hyperlipidemia Mother   ? Hypertension Mother   ? Obesity Mother   ? Diabetes Father   ? Alzheimer's disease Father   ? Coronary artery disease Father   ? Obesity Father   ? Cancer Neg Hx   ?     breast, ovarian, colon  ? Breast cancer Neg Hx   ?  ? ?Social History  ? ?Tobacco Use  ? Smoking status: Never  ? Smokeless tobacco: Never  ?Substance Use Topics  ? Alcohol use: Yes  ?  Alcohol/week: 0.0 standard drinks  ?  Comment: rare  ? Drug use: No  ? ? ?Allergies as of 02/02/2022  ? (No Known Allergies)  ? ? ?Review of Systems:    ?All systems reviewed and negative except where noted in HPI. ? ? Physical Exam:  ?BP 122/81   Pulse (!) 108   Temp 98.2 ?F (36.8 ?C) (Oral)   Ht '5\' 3"'$  (1.6 m)   Wt 216 lb 6.4 oz (98.2 kg)   BMI 38.33 kg/m?  ?No LMP recorded. Patient has had a hysterectomy. ?Psych:  Alert and cooperative. Normal mood and affect. ?General:   Alert,  Well-developed, well-nourished, pleasant and cooperative in NAD ?Head:  Normocephalic and atraumatic. ?Eyes:  Sclera clear, no icterus.   Conjunctiva pink. ?Ears:  Normal auditory acuity. ?Neck:  Supple; no masses or thyromegaly. ?Lungs:  Respirations even and unlabored.  Clear throughout to auscultation.   No wheezes, crackles, or rhonchi. No acute distress. ?Heart:  Regular rate and rhythm; no murmurs, clicks, rubs, or gallops. ?Abdomen:  Normal bowel sounds.  No bruits.  Soft, non-tender and non-distended without masses, hepatosplenomegaly or hernias noted.  No guarding  or rebound tenderness.    ?Neurologic:  Alert and oriented x3;  grossly normal neurologically. ?Psych:  Alert and cooperative. Normal mood and affect. ? ?Imaging Studies: ?DG Hip Unilat W OR W/O Pelvis 2-3 Views Right ? ?Result Date: 01/07/2022 ?CLINICAL DATA:  Chronic right hip pain. Posterior hip pain aggravated by walking. EXAM: DG HIP (WITH OR WITHOUT PELVIS) 2-3V RIGHT COMPARISON:  None. FINDINGS: There right hip joint space is preserved. Minimal spurring of the lateral acetabulum. Minimal smooth osseous bump about  the lateral femoral head neck junction. No fracture, erosion, or evidence of avascular necrosis. Bony pelvis is intact. Pubic symphysis and sacroiliac joints are congruent. There is no evidence of focal bone lesion or bone destruction. Minimal enthesopathic change about the greater trochanter. IMPRESSION: Minimal osteoarthritis of the right hip. Minimal smooth osseous bump about the lateral femoral head neck junction, morphology can be seen with femoroacetabular impingement. Electronically Signed   By: Keith Rake M.D.   On: 01/07/2022 10:32   ? ?Assessment and Plan:  ? ?Sheri [name] is a 66 y.o. y/o female has been referred for dysphagia.  History of reflux esophagitis.  Prior GI note in 2020 mentions history of Barrett's esophagus but I could not find any biopsies confirm the same. ? ? ?Plan ?1.  EGD ?2.  GERD continue PPI in view of reflux esophagitis and hiatal hernia that she has had in the past.  Very unlikely that the reflux will spontaneously resolve on its own due to presence of hiatal hernia and significant inflammation.  She would benefit from long-term use of a PPI.  Discussed the risks versus benefits of long-term PPI use including but not limited to infection thinning of bones possible kidney disease and any decision to proceed should be weighed with the risks and benefits taken into consideration.  Plan would be to use the lowest dose that would be required to keep her symptoms  under check.  At this point of time since she is symptomatic I will increase the dose of Protonix to 40 mg twice daily and plan to reduce the dose at the next visit if doing well. ? ? ?I have discussed alternati

## 2022-02-02 NOTE — Patient Instructions (Signed)
? ?Please buy a wedge pillow to help you with your acid reflux. ? ?Food Choices for Gastroesophageal Reflux Disease, Adult ?When you have gastroesophageal reflux disease (GERD), the foods you eat and your eating habits are very important. Choosing the right foods can help ease your discomfort. Think about working with a food expert (dietitian) to help you make good choices. ?What are tips for following this plan? ?Reading food labels ?Look for foods that are low in saturated fat. Foods that may help with your symptoms include: ?Foods that have less than 5% of daily value (DV) of fat. ?Foods that have 0 grams of trans fat. ?Cooking ?Do not fry your food. ?Cook your food by baking, steaming, grilling, or broiling. These are all methods that do not need a lot of fat for cooking. ?To add flavor, try to use herbs that are low in spice and acidity. ?Meal planning ? ?Choose healthy foods that are low in fat, such as: ?Fruits and vegetables. ?Whole grains. ?Low-fat dairy products. ?Lean meats, fish, and poultry. ?Eat small meals often instead of eating 3 large meals each day. Eat your meals slowly in a place where you are relaxed. Avoid bending over or lying down until 2-3 hours after eating. ?Limit high-fat foods such as fatty meats or fried foods. ?Limit your intake of fatty foods, such as oils, butter, and shortening. ?Avoid the following as told by your doctor: ?Foods that cause symptoms. These may be different for different people. Keep a food diary to keep track of foods that cause symptoms. ?Alcohol. ?Drinking a lot of liquid with meals. ?Eating meals during the 2-3 hours before bed. ?Lifestyle ?Stay at a healthy weight. Ask your doctor what weight is healthy for you. If you need to lose weight, work with your doctor to do so safely. ?Exercise for at least 30 minutes on 5 or more days each week, or as told by your doctor. ?Wear loose-fitting clothes. ?Do not smoke or use any products that contain nicotine or  tobacco. If you need help quitting, ask your doctor. ?Sleep with the head of your bed higher than your feet. Use a wedge under the mattress or blocks under the bed frame to raise the head of the bed. ?Chew sugar-free gum after meals. ?What foods should eat? ? ?Eat a healthy, well-balanced diet of fruits, vegetables, whole grains, low-fat dairy products, lean meats, fish, and poultry. Each person is different. ?Foods that may cause symptoms in one person may not cause any symptoms in another person. Work with your doctor to find foods that are safe for you. ?The items listed above may not be a complete list of what you can eat and drink. Contact a food expert for more options. ?What foods should I avoid? ?Limiting some of these foods may help in managing the symptoms of GERD. Everyone is different. Talk with a food expert or your doctor to help you find the exact foods to avoid, if any. ?Fruits ?Any fruits prepared with added fat. Any fruits that cause symptoms. For some people, this may include citrus fruits, such as oranges, grapefruit, pineapple, and lemons. ?Vegetables ?Deep-fried vegetables. [place] fries. Any vegetables prepared with added fat. Any vegetables that cause symptoms. For some people, this may include tomatoes and tomato products, chili peppers, onions and garlic, and horseradish. ?Grains ?Pastries or quick breads with added fat. ?Meats and other proteins ?High-fat meats, such as fatty beef or pork, hot dogs, ribs, ham, sausage, salami, and bacon. Fried meat or protein, including  fried fish and fried chicken. Nuts and nut butters, in large amounts. ?Dairy ?Whole milk and chocolate milk. Sour cream. Cream. Ice cream. Cream cheese. Milkshakes. ?Fats and oils ?Butter. Margarine. Shortening. Ghee. ?Beverages ?Coffee and tea, with or without caffeine. Carbonated beverages. Sodas. Energy drinks. Fruit juice made with acidic fruits, such as orange or grapefruit. Tomato juice. Alcoholic drinks. ?Sweets and  desserts ?Chocolate and cocoa. Donuts. ?Seasonings and condiments ?Pepper. Peppermint and spearmint. Added salt. Any condiments, herbs, or seasonings that cause symptoms. For some people, this may include curry, hot sauce, or vinegar-based salad dressings. ?The items listed above may not be a complete list of what you should not eat and drink. Contact a food expert for more options. ?Questions to ask your doctor ?Diet and lifestyle changes are often the first steps that are taken to manage symptoms of GERD. If diet and lifestyle changes do not help, talk with your doctor about taking medicines. ?Where to find more information ?International Foundation for Gastrointestinal Disorders: aboutgerd.org ?Summary ?When you have GERD, food and lifestyle choices are very important in easing your symptoms. ?Eat small meals often instead of 3 large meals a day. Eat your meals slowly and in a place where you are relaxed. ?Avoid bending over or lying down until 2-3 hours after eating. ?Limit high-fat foods such as fatty meats or fried foods. ?This information is not intended to replace advice given to you by your health care provider. Make sure you discuss any questions you have with your health care provider. ?Document Revised: 03/17/2020 Document Reviewed: 03/17/2020 ?Elsevier Patient Education ? [place]. ? ?

## 2022-02-09 ENCOUNTER — Encounter
Admission: RE | Disposition: A | Discharge status: Home / Self Care | Payer: Self-pay | Source: Ambulatory Visit | Attending: Gastroenterology

## 2022-02-09 ENCOUNTER — Encounter: Payer: Self-pay | Admitting: Gastroenterology

## 2022-02-09 ENCOUNTER — Ambulatory Visit: Payer: PPO | Admitting: Anesthesiology

## 2022-02-09 ENCOUNTER — Ambulatory Visit
Admission: RE | Admit: 2022-02-09 | Discharge: 2022-02-09 | Disposition: A | Discharge status: Home / Self Care | Payer: PPO | Source: Ambulatory Visit | Attending: Gastroenterology | Admitting: Gastroenterology

## 2022-02-09 DIAGNOSIS — E785 Hyperlipidemia, unspecified: Secondary | ICD-10-CM | POA: Diagnosis not present

## 2022-02-09 DIAGNOSIS — Z8541 Personal history of malignant neoplasm of cervix uteri: Secondary | ICD-10-CM | POA: Diagnosis not present

## 2022-02-09 DIAGNOSIS — F419 Anxiety disorder, unspecified: Secondary | ICD-10-CM | POA: Insufficient documentation

## 2022-02-09 DIAGNOSIS — G4730 Sleep apnea, unspecified: Secondary | ICD-10-CM | POA: Diagnosis not present

## 2022-02-09 DIAGNOSIS — I10 Essential (primary) hypertension: Secondary | ICD-10-CM | POA: Diagnosis not present

## 2022-02-09 DIAGNOSIS — M8580 Other specified disorders of bone density and structure, unspecified site: Secondary | ICD-10-CM | POA: Diagnosis not present

## 2022-02-09 DIAGNOSIS — K2289 Other specified disease of esophagus: Secondary | ICD-10-CM | POA: Insufficient documentation

## 2022-02-09 DIAGNOSIS — K219 Gastro-esophageal reflux disease without esophagitis: Secondary | ICD-10-CM | POA: Insufficient documentation

## 2022-02-09 DIAGNOSIS — R1310 Dysphagia, unspecified: Secondary | ICD-10-CM | POA: Insufficient documentation

## 2022-02-09 DIAGNOSIS — Z6838 Body mass index (BMI) 38.0-38.9, adult: Secondary | ICD-10-CM | POA: Diagnosis not present

## 2022-02-09 DIAGNOSIS — E669 Obesity, unspecified: Secondary | ICD-10-CM | POA: Insufficient documentation

## 2022-02-09 DIAGNOSIS — E119 Type 2 diabetes mellitus without complications: Secondary | ICD-10-CM | POA: Insufficient documentation

## 2022-02-09 DIAGNOSIS — Z0000 Encounter for general adult medical examination without abnormal findings: Secondary | ICD-10-CM | POA: Diagnosis not present

## 2022-02-09 HISTORY — PX: ESOPHAGOGASTRODUODENOSCOPY (EGD) WITH PROPOFOL: SHX5813

## 2022-02-09 LAB — GLUCOSE, CAPILLARY: Glucose-Capillary: 104 mg/dL — ABNORMAL HIGH (ref 70–99)

## 2022-02-09 SURGERY — ESOPHAGOGASTRODUODENOSCOPY (EGD) WITH PROPOFOL
Anesthesia: General

## 2022-02-09 MED ORDER — PROPOFOL 10 MG/ML IV BOLUS
INTRAVENOUS | Status: DC | PRN
  Administered 2022-02-09: 100 mg via INTRAVENOUS

## 2022-02-09 MED ORDER — SODIUM CHLORIDE 0.9 % IV SOLN: INTRAVENOUS | Status: DC

## 2022-02-09 MED ORDER — SODIUM CHLORIDE 0.9 % IV SOLN
INTRAVENOUS | Status: DC | PRN
Start: 2022-02-09 — End: 2022-02-09

## 2022-02-09 MED ORDER — PROPOFOL 500 MG/50ML IV EMUL
INTRAVENOUS | Status: DC | PRN
  Administered 2022-02-09: 200 ug/kg/min via INTRAVENOUS

## 2022-02-09 NOTE — Anesthesia Postprocedure Evaluation (Signed)
Anesthesia Post Note  Patient: Sheri [name]  Procedure(s) Performed: ESOPHAGOGASTRODUODENOSCOPY (EGD) WITH PROPOFOL  Patient location during evaluation: Endoscopy Anesthesia Type: General Level of consciousness: awake and alert Pain management: pain level controlled Vital Signs Assessment: post-procedure vital signs reviewed and stable Respiratory status: spontaneous breathing, nonlabored ventilation, respiratory function stable and patient connected to nasal cannula oxygen Cardiovascular status: blood pressure returned to baseline and stable Postop Assessment: no apparent nausea or vomiting Anesthetic complications: no   No notable events documented.   Last Vitals:  Vitals:   02/09/22 1030 02/09/22 1040  BP: 129/85 130/80  Pulse: 88   Resp: 19 16  Temp:    SpO2: 100% 100%    Last Pain:  Vitals:   02/09/22 1010  TempSrc: Temporal  PainSc:                  [name] [name]

## 2022-02-09 NOTE — Transfer of Care (Signed)
Immediate Anesthesia Transfer of Care Note  Patient: Sheri [name]  Procedure(s) Performed: ESOPHAGOGASTRODUODENOSCOPY (EGD) WITH PROPOFOL  Patient Location: PACU  Anesthesia Type:General  Level of Consciousness: sedated  Airway & Oxygen Therapy: Patient Spontanous Breathing and Patient connected to nasal cannula oxygen  Post-op Assessment: Report given to RN and Post -op Vital signs reviewed and stable  Post vital signs: Reviewed and stable  Last Vitals:  Vitals Value Taken Time  BP 129/80 02/09/22 1011  Temp 36.5 C 02/09/22 1010  Pulse 87 02/09/22 1013  Resp 16 02/09/22 1013  SpO2 95 % 02/09/22 1013  Vitals shown include unvalidated device data.  Last Pain:  Vitals:   02/09/22 1010  TempSrc: Temporal  PainSc:          Complications: No notable events documented.

## 2022-02-09 NOTE — H&P (Signed)
Jonathon Bellows, MD [address] [place], [place], [place], [address] [id number] [place], [place], [place], [address] Phone: [phone]  Fax: [phone]  Primary Care Physician:  Crecencio Mc, MD   Pre-Procedure History & Physical: HPI:  Sheri [name] is a 66 y.o. female is here for an endoscopy    Past Medical History:  Diagnosis Date   Abdominal pain, epigastric    Anxiety    Back pain    Diabetes mellitus, type 2 ([place])    Diverticulosis of colon without hemorrhage    Duodenitis with hemorrhage    Edema    Fluid retention    GERD (gastroesophageal reflux disease)    Heart murmur    Hyperlipidemia    Hypertension    Leg edema    Mitral regurgitation    Obesity, unspecified    Osteopenia    Panic disorder without agoraphobia    Peripheral neuropathy    Personal history of malignant neoplasm of cervix uteri    CIN-3 1989, normal since, gets pap smears annually   Personal history of other genital system and obstetric disorders(V13.29)    Prediabetes    Rash and other nonspecific skin eruption    Reflux esophagitis    Swallowing difficulty    Unspecified hereditary and idiopathic peripheral neuropathy    Unspecified sleep apnea    sleep study- [facility]   Vitamin D deficiency     Past Surgical History:  Procedure Laterality Date   ABDOMINAL HYSTERECTOMY     BREAST CYST ASPIRATION     right , benign   COLONOSCOPY WITH PROPOFOL N/A 06/09/2018   Procedure: COLONOSCOPY WITH PROPOFOL;  Surgeon: Lollie Sails, MD;  Location: [facility] ENDOSCOPY;  Service: Endoscopy;  Laterality: N/A;   DILATION AND CURETTAGE OF UTERUS     ESOPHAGOGASTRODUODENOSCOPY N/A 06/09/2018   Procedure: ESOPHAGOGASTRODUODENOSCOPY (EGD);  Surgeon: Lollie Sails, MD;  Location: [facility] ENDOSCOPY;  Service: Endoscopy;  Laterality: N/A;   RIGHT OOPHORECTOMY  2001   menopause, endometriosis s/p.  HRT  since- has not tolerated previous attempts to wean    Prior to Admission medications    Medication Sig Start Date End Date Taking? Authorizing Provider  ALPRAZolam Duanne Moron) 0.5 MG tablet TAKE 1 TABLET BY MOUTH TWICE DAILY AS NEEDED FOR ANXIETY OR SLEEP 11/11/21 05/10/22 Yes Crecencio Mc, MD  amitriptyline (ELAVIL) 25 MG tablet TAKE 3 TABLETS ('75MG'$ ) BY MOUTH AT BEDTIME FOR NEUROPATHY 01/05/22 01/05/23 Yes Crecencio Mc, MD  atorvastatin (LIPITOR) 40 MG tablet Take 1 tablet (40 mg total) by mouth daily. 11/13/21  Yes Crecencio Mc, MD  buPROPion (WELLBUTRIN XL) 150 MG 24 hr tablet Take 1 tablet (150 mg total) by mouth daily. 12/23/21  Yes Crecencio Mc, MD  Calcium Carb-Cholecalciferol 600-20 MG-MCG TABS Take 1 tablet by mouth daily.   Yes [provider]  escitalopram (LEXAPRO) 10 MG tablet Take 1 tablet (10 mg total) by mouth daily with breakfast. 12/21/21  Yes Crecencio Mc, MD  furosemide (LASIX) 20 MG tablet TAKE 1 TABLET BY MOUTH ONCE DAILY AS NEEDED FOR FLUID RETENTION 06/18/19  Yes Crecencio Mc, MD  gabapentin (NEURONTIN) 300 MG capsule TAKE 1 CAPSULE BY MOUTH ONCE DAILY 11/11/21 11/11/22 Yes Crecencio Mc, MD  pantoprazole (PROTONIX) 40 MG tablet Take 1 tablet (40 mg total) by mouth 2 (two) times daily. 02/02/22  Yes Jonathon Bellows, MD  Semaglutide (RYBELSUS) 14 MG TABS Take 14 mg by mouth daily. 09/29/21  Yes Crecencio Mc, MD  tiZANidine (ZANAFLEX) 4 MG tablet Take 1 tablet (4 mg total) by mouth every 6 (six) hours as needed for muscle spasms. 06/29/21  Yes Crecencio Mc, MD    Allergies as of 02/02/2022   (No Known Allergies)    Family History  Problem Relation Age of Onset   Osteopenia Mother    Hypothyroidism Mother    Diabetes Mother    Hyperlipidemia Mother    Hypertension Mother    Obesity Mother    Diabetes Father    Alzheimer's disease Father    Coronary artery disease Father    Obesity Father    Cancer Neg Hx        breast, ovarian, colon   Breast cancer Neg Hx     Social History   Socioeconomic History   Marital status: Married     Spouse name: Billy Welden   Number of children: 0   Years of education: 14   Highest education level: Not on file  Occupational History   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         Social Determinants of Health   Financial Resource Strain: Medium Risk   Difficulty of Paying Living Expenses: Somewhat hard  Food Insecurity: Not on file  Transportation Needs: Not on file  Physical Activity: Not on file  Stress: Not on file  Social Connections: Not on file  Intimate Partner Violence: Not on file    Review of Systems: See HPI, otherwise negative ROS  Physical Exam: BP (!) 142/91   Pulse 93   Temp 97.7 F (36.5 C) (Temporal)   Resp 16   Ht 5' 2.5" (1.588 m)   Wt 98.2 kg   SpO2 98%   BMI 38.96 kg/m  General:   Alert,  pleasant and cooperative in NAD Head:  Normocephalic and atraumatic. Neck:  Supple; no masses or thyromegaly. Lungs:  Clear throughout to auscultation, normal respiratory effort.    Heart:  +S1, +S2, Regular rate and rhythm, No edema. Abdomen:  Soft, nontender and nondistended. Normal bowel sounds, without guarding, and without rebound.   Neurologic:  Alert and  oriented x4;  grossly normal neurologically.  Impression/Plan: Sheri [name] is here for an endoscopy  to be performed for  evaluation of dysphagia    Risks, benefits, limitations, and alternatives regarding endoscopy have been reviewed with the patient.  Questions have been answered.  All parties agreeable.   Jonathon Bellows, MD  02/09/2022, 9:40 AM

## 2022-02-09 NOTE — Anesthesia Procedure Notes (Signed)
Date/Time: 02/09/2022 10:03 AM Performed by: Nelda Marseille, CRNA Pre-anesthesia Checklist: Patient identified, Emergency Drugs available, Suction available, Patient being monitored and Timeout performed Oxygen Delivery Method: Nasal cannula

## 2022-02-09 NOTE — Anesthesia Preprocedure Evaluation (Signed)
Anesthesia Evaluation  Patient identified by MRN, date of birth, ID band Patient awake    Reviewed: Allergy & Precautions, NPO status , Patient's Chart, lab work & pertinent test results  History of Anesthesia Complications Negative for: history of anesthetic complications  Airway Mallampati: III  TM Distance: <3 FB Neck ROM: full    Dental  (+) Chipped   Pulmonary neg shortness of breath, sleep apnea ,    Pulmonary exam normal        Cardiovascular Exercise Tolerance: Good hypertension, (-) angina(-) Past MI Normal cardiovascular exam     Neuro/Psych  Neuromuscular disease negative psych ROS   GI/Hepatic Neg liver ROS, GERD  Controlled,  Endo/Other  diabetes, Type 2  Renal/GU negative Renal ROS  negative genitourinary   Musculoskeletal   Abdominal   Peds  Hematology negative hematology ROS (+)   Anesthesia Other Findings Past Medical History: No date: Abdominal pain, epigastric No date: Anxiety No date: Back pain No date: Diabetes mellitus, type 2 ([facility]) No date: Diverticulosis of colon without hemorrhage No date: Duodenitis with hemorrhage No date: Edema No date: Fluid retention No date: GERD (gastroesophageal reflux disease) No date: Heart murmur No date: Hyperlipidemia No date: Hypertension No date: Leg edema No date: Mitral regurgitation No date: Obesity, unspecified No date: Osteopenia No date: Panic disorder without agoraphobia No date: Peripheral neuropathy No date: Personal history of malignant neoplasm of cervix uteri     Comment:  CIN-3 1989, normal since, gets pap smears annually No date: Personal history of other genital system and obstetric  disorders(V13.29) No date: Prediabetes No date: Rash and other nonspecific skin eruption No date: Reflux esophagitis No date: Swallowing difficulty No date: Unspecified hereditary and idiopathic peripheral neuropathy No date: Unspecified sleep  apnea     Comment:  sleep study- [facility] No date: Vitamin D deficiency  Past Surgical History: No date: ABDOMINAL HYSTERECTOMY No date: BREAST CYST ASPIRATION     Comment:  right , benign 06/09/2018: COLONOSCOPY WITH PROPOFOL; N/A     Comment:  Procedure: COLONOSCOPY WITH PROPOFOL;  Surgeon:               Lollie Sails, MD;  Location: [facility] ENDOSCOPY;                Service: Endoscopy;  Laterality: N/A; No date: DILATION AND CURETTAGE OF UTERUS 06/09/2018: ESOPHAGOGASTRODUODENOSCOPY; N/A     Comment:  Procedure: ESOPHAGOGASTRODUODENOSCOPY (EGD);  Surgeon:               Lollie Sails, MD;  Location: [facility] ENDOSCOPY;                Service: Endoscopy;  Laterality: N/A; 2001: RIGHT OOPHORECTOMY     Comment:  menopause, endometriosis s/p.  HRT  since- has not               tolerated previous attempts to wean  BMI    Body Mass Index: 38.96 kg/m      Reproductive/Obstetrics negative OB ROS                             Anesthesia Physical Anesthesia Plan  ASA: 3  Anesthesia Plan: General   Post-op Pain Management:    Induction: Intravenous  PONV Risk Score and Plan: Propofol infusion and TIVA  Airway Management Planned: Natural Airway and Nasal Cannula  Additional Equipment:   Intra-op Plan:   Post-operative Plan:   Informed Consent: I  have reviewed the patients History and Physical, chart, labs and discussed the procedure including the risks, benefits and alternatives for the proposed anesthesia with the patient or authorized representative who has indicated his/her understanding and acceptance.     Dental Advisory Given  Plan Discussed with: Anesthesiologist, CRNA and Surgeon  Anesthesia Plan Comments: (Patient consented for risks of anesthesia including but not limited to:  - adverse reactions to medications - risk of airway placement if required - damage to eyes, teeth, lips or other oral mucosa - nerve damage due to positioning  -  sore throat or hoarseness - Damage to heart, brain, nerves, lungs, other parts of body or loss of life  Patient voiced understanding.)        Anesthesia Quick Evaluation

## 2022-02-09 NOTE — Op Note (Signed)
[facility] Patient Name: Sheri [name] Procedure Date: 02/09/2022 9:39 AM MRN: [id number] Account #: [id number] Date of Birth: October 13, 1955 Admit Type: Outpatient Age: 66 Room: [facility] ENDO ROOM 2 Gender: Female Note Status: Finalized Instrument Name: Patton Salles Endoscope [id number] Procedure:             Upper GI endoscopy Indications:           Dysphagia Providers:             Wyline Mood MD, MD Referring MD:          Duncan Dull, MD (Referring MD) Medicines:             Monitored Anesthesia Care Complications:         No immediate complications. Procedure:             Pre-Anesthesia Assessment:                        - Prior to the procedure, a History and Physical was                         performed, and patient medications, allergies and                         sensitivities were reviewed. The patient's tolerance                         of previous anesthesia was reviewed.                        - The risks and benefits of the procedure and the                         sedation options and risks were discussed with the                         patient. All questions were answered and informed                         consent was obtained.                        - ASA Grade Assessment: II - A patient with mild                         systemic disease.                        After obtaining informed consent, the endoscope was                         passed under direct vision. Throughout the procedure,                         the patient's blood pressure, pulse, and oxygen                         saturations were monitored continuously. The Endoscope                         was introduced through  the mouth, and advanced to the                         third part of duodenum. The upper GI endoscopy was                         accomplished with ease. The patient tolerated the                         procedure well. Findings:      The examined esophagus was  normal. Biopsies were taken with a cold       forceps for histology.      The stomach was normal.      The examined duodenum was normal. Impression:            - Normal esophagus. Biopsied.                        - Normal stomach.                        - Normal examined duodenum. Recommendation:        - Await pathology results.                        - Discharge patient to home (with escort).                        - Resume previous diet.                        - Continue present medications.                        - Return to my office as previously scheduled. Procedure Code(s):     --- Professional ---                        [phone], Esophagogastroduodenoscopy, flexible,                         transoral; with biopsy, single or multiple Diagnosis Code(s):     --- Professional ---                        R13.10, Dysphagia, unspecified CPT copyright 2019 American Medical Association. All rights reserved. The codes documented in this report are preliminary and upon coder review may  be revised to meet current compliance requirements. Wyline Mood, MD Wyline Mood MD, MD 02/09/2022 10:08:49 AM This report has been signed electronically. Number of Addenda: 0 Note Initiated On: 02/09/2022 9:39 AM Estimated Blood Loss:  Estimated blood loss: none.      [facility]

## 2022-02-10 ENCOUNTER — Encounter: Payer: Self-pay | Admitting: Gastroenterology

## 2022-02-10 LAB — SURGICAL PATHOLOGY

## 2022-03-04 ENCOUNTER — Ambulatory Visit (INDEPENDENT_AMBULATORY_CARE_PROVIDER_SITE_OTHER): Payer: PPO

## 2022-03-04 DIAGNOSIS — E538 Deficiency of other specified B group vitamins: Secondary | ICD-10-CM | POA: Diagnosis not present

## 2022-03-04 MED ORDER — CYANOCOBALAMIN 1000 MCG/ML IJ SOLN
1000.0000 ug | Freq: Once | INTRAMUSCULAR | Status: AC
  Administered 2022-03-04: 1000 ug via INTRAMUSCULAR

## 2022-03-04 NOTE — Progress Notes (Signed)
Pt presented today for b12 injection. Right deltoid, IM. Pt voiced no concerns nor showed any signs of distress during injection.  

## 2022-03-05 ENCOUNTER — Ambulatory Visit: Payer: PPO | Admitting: Internal Medicine

## 2022-03-25 ENCOUNTER — Telehealth: Payer: Self-pay

## 2022-03-25 ENCOUNTER — Other Ambulatory Visit: Payer: Self-pay | Admitting: Internal Medicine

## 2022-03-25 DIAGNOSIS — E1169 Type 2 diabetes mellitus with other specified complication: Secondary | ICD-10-CM

## 2022-03-25 NOTE — Telephone Encounter (Signed)
I called and spoke with the patient and informed her that her rybelsus has arrived, she stated she would pick up on Monday.  Anuoluwapo Mefferd,cma

## 2022-03-25 NOTE — Telephone Encounter (Signed)
-----   Message from Crecencio Mc, MD sent at 03/25/2022 12:54 PM EDT ----- Regarding: Rybelsus Patient's medication , Rybelsus,  has been received from [place]  please notify her.

## 2022-03-25 NOTE — Telephone Encounter (Signed)
LMTCB we have patient's 4 boxes of Rybelsus 14 mg to be picked up.

## 2022-04-06 ENCOUNTER — Ambulatory Visit (INDEPENDENT_AMBULATORY_CARE_PROVIDER_SITE_OTHER): Payer: PPO

## 2022-04-06 DIAGNOSIS — E538 Deficiency of other specified B group vitamins: Secondary | ICD-10-CM

## 2022-04-06 MED ORDER — CYANOCOBALAMIN 1000 MCG/ML IJ SOLN
1000.0000 ug | Freq: Once | INTRAMUSCULAR | Status: AC
  Administered 2022-04-06: 1000 ug via INTRAMUSCULAR

## 2022-04-06 NOTE — Progress Notes (Signed)
Patient presented for B 12 injection to left deltoid, patient voiced no concerns nor showed any signs of distress during injection. 

## 2022-04-15 ENCOUNTER — Telehealth: Payer: PPO

## 2022-04-28 ENCOUNTER — Encounter (INDEPENDENT_AMBULATORY_CARE_PROVIDER_SITE_OTHER): Payer: Self-pay

## 2022-05-07 ENCOUNTER — Ambulatory Visit (INDEPENDENT_AMBULATORY_CARE_PROVIDER_SITE_OTHER): Payer: PPO

## 2022-05-07 DIAGNOSIS — E538 Deficiency of other specified B group vitamins: Secondary | ICD-10-CM

## 2022-05-07 MED ORDER — CYANOCOBALAMIN 1000 MCG/ML IJ SOLN
1000.0000 ug | Freq: Once | INTRAMUSCULAR | Status: AC
  Administered 2022-05-07: 1000 ug via INTRAMUSCULAR

## 2022-05-07 NOTE — Progress Notes (Signed)
Patient presented for B 12 injection to left deltoid, patient voiced no concerns nor showed any signs of distress during injection. 

## 2022-05-11 ENCOUNTER — Other Ambulatory Visit: Payer: Self-pay

## 2022-05-11 ENCOUNTER — Ambulatory Visit (INDEPENDENT_AMBULATORY_CARE_PROVIDER_SITE_OTHER): Payer: PPO | Admitting: Gastroenterology

## 2022-05-11 DIAGNOSIS — K219 Gastro-esophageal reflux disease without esophagitis: Secondary | ICD-10-CM | POA: Diagnosis not present

## 2022-05-11 DIAGNOSIS — R1310 Dysphagia, unspecified: Secondary | ICD-10-CM | POA: Diagnosis not present

## 2022-05-11 MED ORDER — OMEPRAZOLE 20 MG PO CPDR: 20.0000 mg | DELAYED_RELEASE_CAPSULE | Freq: Every day | ORAL | 3 refills | Status: DC

## 2022-05-11 NOTE — Patient Instructions (Addendum)
Please decrease your Prilosec 40 MG to 20 MG once a day. A new prescription will be sent to your pharmacy.

## 2022-05-11 NOTE — Progress Notes (Signed)
Sheri Bellows MD, MRCP(U.K) [address]  [place]  Sheri [name], [place] [address]  Main: [phone]  Fax: [phone]   Primary Care Physician: Sheri Mc, MD  Primary Gastroenterologist:  Dr. Jonathon [name]  Dysphagia follow up    HPI: Sheri [name] is a 66 y.o. female   Summary of history :  Initially referred in 01/2022 for dysphagia onhoing for a few months more for solids , occurs a few times a week .  Previously patient of [facility] gastroenterology last seen in October 2020.  She carries a diagnosis of Barrett's esophagus.  Last EGD on epic in 2019 by Dr. Gustavo Lah demonstrated LA grade C esophagitis hiatal hernia was noted.  Biopsies showed reflux esophagitis no evidence of Barrett's on the report.  She also underwent a colonoscopy at that time that showed diverticulosis of the colon.  Interval history  02/02/2022-05/11/2022  02/09/2022: Normal : biopsies of esophagus showed features of reflux.   Since her last visit having no issues with swallowing no symptoms of acid reflux.  She is on omeprazole 40 mg twice a day. Current Outpatient Medications  Medication Sig Dispense Refill   omeprazole (PRILOSEC) 20 MG capsule Take 1 capsule (20 mg total) by mouth daily. 90 capsule 3   ALPRAZolam (XANAX) 0.5 MG tablet TAKE ONE TABLET BY MOUTH TWICE DAILY AS NEEDED FOR ANXIETY OR SLEEP 45 tablet 2   amitriptyline (ELAVIL) 25 MG tablet TAKE 3 TABLETS ('75MG'$ ) BY MOUTH AT BEDTIME FOR NEUROPATHY 270 tablet 1   atorvastatin (LIPITOR) 20 MG tablet Take 1 tablet by mouth daily.     buPROPion (WELLBUTRIN XL) 150 MG 24 hr tablet Take 1 tablet (150 mg total) by mouth daily. 90 tablet 1   Calcium Carb-Cholecalciferol 600-20 MG-MCG TABS Take 1 tablet by mouth daily.     escitalopram (LEXAPRO) 10 MG tablet Take 1 tablet (10 mg total) by mouth daily with breakfast. 90 tablet 1   furosemide (LASIX) 20 MG tablet TAKE 1 TABLET BY MOUTH ONCE DAILY AS NEEDED FOR FLUID RETENTION 30 tablet 3    gabapentin (NEURONTIN) 300 MG capsule TAKE 1 CAPSULE BY MOUTH ONCE DAILY 90 capsule 1   meloxicam (MOBIC) 7.5 MG tablet Take 1 tablet by mouth daily.     Semaglutide (RYBELSUS) 14 MG TABS Take 14 mg by mouth daily. 90 tablet 1   tiZANidine (ZANAFLEX) 4 MG tablet Take 1 tablet (4 mg total) by mouth every 6 (six) hours as needed for muscle spasms. 30 tablet 3   Current Facility-Administered Medications  Medication Dose Route Frequency Provider Last Rate Last Admin   cyanocobalamin ((VITAMIN B-12)) injection 1,000 mcg  1,000 mcg Intramuscular Q30 days Sheri Mc, MD   1,000 mcg at 11/02/21 1401    Allergies as of 05/11/2022   (No Known Allergies)    ROS:  General: Negative for anorexia, weight loss, fever, chills, fatigue, weakness. ENT: Negative for hoarseness, difficulty swallowing , nasal congestion. CV: Negative for chest pain, angina, palpitations, dyspnea on exertion, peripheral edema.  Respiratory: Negative for dyspnea at rest, dyspnea on exertion, cough, sputum, wheezing.  GI: See history of present illness. GU:  Negative for dysuria, hematuria, urinary incontinence, urinary frequency, nocturnal urination.  Endo: Negative for unusual weight change.    Physical Examination:   There were no vitals taken for this visit.  General: Well-nourished, well-developed in no acute distress.  Eyes: No icterus. Conjunctivae pink. Mouth: Oropharyngeal mucosa moist and pink , no lesions erythema or  exudate. Neuro: Alert and oriented x 3.  Grossly intact. Skin: Warm and dry, no jaundice.   Psych: Alert and cooperative, normal mood and affect.   Imaging Studies: No results found.  Assessment and Plan:   Sheri [name] is a 66 y.o. y/o female here to follow up for dysphagia likely secondary to reflux esophagitis.  Doing very well on omeprazole 40 mg twice a day discussed the benefits versus risks of long-term PPI use hence we will try to gradually take her off PPI and transition her  to famotidine it is unlikely that I will be able to take her off, all the medications as she has a hiatal hernia.     Plan 1.  Decrease dose of omeprazole from 40 mg twice a day to 20 mg twice a day.  I will see her back in 6 months at that point of time if she is doing well we will change her to famotidine 40 mg once at night.  Advised her that if her symptoms recur on the lower dose of omeprazole then we would have no choice but to revert to a higher dose.  I also discussed about conservative management of acid reflux which we have discussed previously and she seems to be adherent to the same again discussed about weight loss as an important factor and conservative management of acid reflux.     Dr Sheri Bellows  MD,MRCP [facility]) Follow up in 6 months

## 2022-05-21 ENCOUNTER — Other Ambulatory Visit: Payer: Self-pay | Admitting: Internal Medicine

## 2022-05-25 ENCOUNTER — Other Ambulatory Visit: Payer: Self-pay | Admitting: Internal Medicine

## 2022-05-27 ENCOUNTER — Telehealth: Payer: Self-pay

## 2022-05-27 NOTE — Telephone Encounter (Signed)
LMTCB. Need to let pt know that we have received her patient assistance medication.   Rybelsus: 4 boxes

## 2022-05-28 NOTE — Telephone Encounter (Signed)
Patient states she is returning our call.  I read Adair Laundry, CMA's message to patient.

## 2022-06-08 ENCOUNTER — Ambulatory Visit (INDEPENDENT_AMBULATORY_CARE_PROVIDER_SITE_OTHER): Payer: PPO

## 2022-06-08 DIAGNOSIS — E538 Deficiency of other specified B group vitamins: Secondary | ICD-10-CM

## 2022-06-08 MED ORDER — CYANOCOBALAMIN 1000 MCG/ML IJ SOLN
1000.0000 ug | Freq: Once | INTRAMUSCULAR | Status: AC
  Administered 2022-06-08: 1000 ug via INTRAMUSCULAR

## 2022-06-08 NOTE — Progress Notes (Signed)
Pt arrived for B12 injection, given in L deltoid. Pt tolerated injection well, showed no signs of distress nor voiced any concerns.  ?

## 2022-06-21 ENCOUNTER — Other Ambulatory Visit: Payer: Self-pay | Admitting: Internal Medicine

## 2022-06-21 DIAGNOSIS — Z1231 Encounter for screening mammogram for malignant neoplasm of breast: Secondary | ICD-10-CM

## 2022-06-28 ENCOUNTER — Telehealth: Payer: Self-pay | Admitting: Pharmacy Technician

## 2022-06-28 DIAGNOSIS — Z596 Low income: Secondary | ICD-10-CM

## 2022-06-28 NOTE — Progress Notes (Signed)
[facility])                                            [place] Team    06/28/2022  Meaghen Vecchiarelli [place] Nov 26, 1955 [id number]                                      Medication Assistance Referral-FOR 2024 RE ENROLLMENT  Referral From:  [facility]  Kristeen Miss  Medication/Company: Rybelsus / [employer] Patient application portion:  Education officer, museum portion: Faxed  to Dr. Deborra Medina Provider address/fax verified via: Office website   Wilmer Berryhill P. Egan Sahlin, [place]  [phone]

## 2022-06-29 ENCOUNTER — Encounter: Payer: Self-pay | Admitting: Internal Medicine

## 2022-06-29 ENCOUNTER — Ambulatory Visit (INDEPENDENT_AMBULATORY_CARE_PROVIDER_SITE_OTHER): Payer: PPO | Admitting: Internal Medicine

## 2022-06-29 VITALS — BP 126/70 | HR 106 | Temp 98.1°F | Ht 63.0 in | Wt 216.4 lb

## 2022-06-29 DIAGNOSIS — R5383 Other fatigue: Secondary | ICD-10-CM | POA: Diagnosis not present

## 2022-06-29 DIAGNOSIS — E1159 Type 2 diabetes mellitus with other circulatory complications: Secondary | ICD-10-CM | POA: Diagnosis not present

## 2022-06-29 DIAGNOSIS — I152 Hypertension secondary to endocrine disorders: Secondary | ICD-10-CM | POA: Diagnosis not present

## 2022-06-29 DIAGNOSIS — E669 Obesity, unspecified: Secondary | ICD-10-CM

## 2022-06-29 DIAGNOSIS — E785 Hyperlipidemia, unspecified: Secondary | ICD-10-CM

## 2022-06-29 DIAGNOSIS — E1169 Type 2 diabetes mellitus with other specified complication: Secondary | ICD-10-CM | POA: Diagnosis not present

## 2022-06-29 DIAGNOSIS — K2270 Barrett's esophagus without dysplasia: Secondary | ICD-10-CM

## 2022-06-29 DIAGNOSIS — Z23 Encounter for immunization: Secondary | ICD-10-CM | POA: Diagnosis not present

## 2022-06-29 DIAGNOSIS — Z0000 Encounter for general adult medical examination without abnormal findings: Secondary | ICD-10-CM

## 2022-06-29 DIAGNOSIS — Z6839 Body mass index (BMI) 39.0-39.9, adult: Secondary | ICD-10-CM

## 2022-06-29 LAB — COMPREHENSIVE METABOLIC PANEL
ALT: 23 U/L (ref 0–35)
AST: 19 U/L (ref 0–37)
Albumin: 4.6 g/dL (ref 3.5–5.2)
Alkaline Phosphatase: 84 U/L (ref 39–117)
BUN: 14 mg/dL (ref 6–23)
CO2: 24 mEq/L (ref 19–32)
Calcium: 9.8 mg/dL (ref 8.4–10.5)
Chloride: 106 mEq/L (ref 96–112)
Creatinine, Ser: 0.59 mg/dL (ref 0.40–1.20)
GFR: 94.03 mL/min (ref >60.00)
Glucose, Bld: 98 mg/dL (ref 70–99)
Potassium: 3.9 mEq/L (ref 3.5–5.1)
Sodium: 140 mEq/L (ref 135–145)
Total Bilirubin: 1.2 mg/dL (ref 0.2–1.2)
Total Protein: 7.4 g/dL (ref 6.0–8.3)

## 2022-06-29 LAB — CBC WITH DIFFERENTIAL/PLATELET
Basophils Absolute: 0.1 10*3/uL (ref 0.0–0.1)
Basophils Relative: 0.7 % (ref 0.0–3.0)
Eosinophils Absolute: 0.1 10*3/uL (ref 0.0–0.7)
Eosinophils Relative: 1.9 % (ref 0.0–5.0)
HCT: 43.1 % (ref 36.0–46.0)
Hemoglobin: 14.6 g/dL (ref 12.0–15.0)
Lymphocytes Relative: 25.8 % (ref 12.0–46.0)
Lymphs Abs: 2 10*3/uL (ref 0.7–4.0)
MCHC: 33.9 g/dL (ref 30.0–36.0)
MCV: 85 fl (ref 78.0–100.0)
Monocytes Absolute: 0.6 10*3/uL (ref 0.1–1.0)
Monocytes Relative: 7.4 % (ref 3.0–12.0)
Neutro Abs: 4.9 10*3/uL (ref 1.4–7.7)
Neutrophils Relative %: 64.2 % (ref 43.0–77.0)
Platelets: 278 10*3/uL (ref 150.0–400.0)
RBC: 5.07 Mil/uL (ref 3.87–5.11)
RDW: 12.6 % (ref 11.5–15.5)
WBC: 7.7 10*3/uL (ref 4.0–10.5)

## 2022-06-29 LAB — MICROALBUMIN / CREATININE URINE RATIO
Creatinine,U: 86.2 mg/dL
Microalb Creat Ratio: 1 mg/g (ref 0.0–30.0)
Microalb, Ur: 0.9 mg/dL (ref 0.0–1.9)

## 2022-06-29 LAB — LIPID PANEL
Cholesterol: 133 mg/dL (ref 0–200)
HDL: 47.5 mg/dL (ref >39.00)
LDL Cholesterol: 59 mg/dL (ref 0–99)
NonHDL: 85.49
Total CHOL/HDL Ratio: 3
Triglycerides: 131 mg/dL (ref 0.0–149.0)
VLDL: 26.2 mg/dL (ref 0.0–40.0)

## 2022-06-29 LAB — HEMOGLOBIN A1C: Hgb A1c MFr Bld: 5.8 % (ref 4.6–6.5)

## 2022-06-29 LAB — LDL CHOLESTEROL, DIRECT: Direct LDL: 71 mg/dL

## 2022-06-29 NOTE — Progress Notes (Signed)
Patient ID: Sheri [name], female    DOB: 1956/08/27  Age: 66 y.o. MRN: [id number]  The patient is here for annual PREVETIVE examination and management of other chronic and acute problems.   The risk factors are reflected in the social history.  The roster of all physicians providing medical care to patient - is listed in the Snapshot section of the chart.  Activities of daily living:  The patient is 100% independent in all ADLs: dressing, toileting, feeding as well as independent mobility  Home safety : The patient has smoke detectors in the home. They wear seatbelts.  There are no firearms at home. There is no violence in the home.   There is no risks for hepatitis, STDs or HIV. There is no   history of blood transfusion. They have no travel history to infectious disease endemic areas of the world.  The patient has seen their dentist in the last six month. They have seen their eye doctor in the last year. They admit to slight hearing difficulty with regard to whispered voices and some television programs.  They have deferred audiologic testing in the last year.  They do not  have excessive sun exposure. Discussed the need for sun protection: hats, long sleeves and use of sunscreen if there is significant sun exposure.   Diet: the importance of a healthy diet is discussed. They do have a healthy diet.  The benefits of regular aerobic exercise were discussed. She is not exercising or walking regularly .   Depression screen: there are no signs or vegative symptoms of depression- irritability, change in appetite, anhedonia, sadness/tearfullness.  Cognitive assessment: the patient manages all their financial and personal affairs and is actively engaged. They could relate day,date,year and events; recalled 2/3 objects at 3 minutes; performed clock-face test normally.  The following portions of the patient's history were reviewed and updated as appropriate: allergies, current medications, past  family history, past medical history,  past surgical history, past social history  and problem list.  Visual acuity was not assessed per patient preference since she has regular follow up with her ophthalmologist. Hearing and body mass index were assessed and reviewed.   During the course of the visit the patient was educated and counseled about appropriate screening and preventive services including : fall prevention , diabetes screening, nutrition counseling, colorectal cancer screening, and recommended immunizations.    CC: The primary encounter diagnosis was Fatigue, unspecified type. Diagnoses of Hyperlipidemia associated with type 2 diabetes mellitus (HCC), Obesity, diabetes, and hypertension syndrome (HCC), Class 2 severe obesity due to excess calories with serious comorbidity and body mass index (BMI) of 39.0 to 39.9 in adult [facility]), Encounter for preventive health examination, and Barrett's esophagus without dysplasia were also pertinent to this visit.  1) obesity:  has gained weight despite use of rybelsus due to stress snacking .  Husband Sheri [name] has been declining due to ESKD , UTI's , bladder retention and  Fournier's gangrene requiring emergency debridement and plastic surgery .  2) GAD/depression:  reviewed meds, including lexapro, wellbutrin, and xanax .    3) diabetes:  stress eating,  not checking sugars.  Has appt with [facility] . Discussed thn referral to replace rybelsus with mounjaro. Ozempic cuased skin irritation at injection stites   4) new cc:  intermittent feeling of lips swelling and tongue feeling thick for the past month .  Has not changed toothpastes,  mouthwash but she recalls that she had been using a whitening treatment that  her dentist prescribed  for 7 days     History Sheri [name] has a past medical history of Abdominal pain, epigastric, Anxiety, Back pain, Diabetes mellitus, type 2 (HCC), Diverticulosis of colon without hemorrhage, Duodenitis with hemorrhage, Edema,  Fluid retention, GERD (gastroesophageal reflux disease), Heart murmur, Hyperlipidemia, Hypertension, Leg edema, Mitral regurgitation, Obesity, unspecified, Osteopenia, Panic disorder without agoraphobia, Peripheral neuropathy, Personal history of malignant neoplasm of cervix uteri, Personal history of other genital system and obstetric disorders(V13.29), Prediabetes, Rash and other nonspecific skin eruption, Reflux esophagitis, Swallowing difficulty, Unspecified hereditary and idiopathic peripheral neuropathy, Unspecified sleep apnea, and Vitamin D deficiency.   She has a past surgical history that includes Right oophorectomy (2001); Breast cyst aspiration; Dilation and curettage of uterus; Abdominal hysterectomy; Esophagogastroduodenoscopy (N/A, 06/09/2018); Colonoscopy with propofol (N/A, 06/09/2018); and Esophagogastroduodenoscopy (egd) with propofol (N/A, 02/09/2022).   Her family history includes Alzheimer's disease in her father; Coronary artery disease in her father; Diabetes in her father and mother; Hyperlipidemia in her mother; Hypertension in her mother; Hypothyroidism in her mother; Obesity in her father and mother; Osteopenia in her mother.She reports that she has never smoked. She has never used smokeless tobacco. She reports current alcohol use. She reports that she does not use drugs.  Outpatient Medications Prior to Visit  Medication Sig Dispense Refill   ALPRAZolam (XANAX) 0.5 MG tablet TAKE ONE TABLET BY MOUTH TWICE DAILY AS NEEDED FOR ANXIETY OR SLEEP 45 tablet 2   amitriptyline (ELAVIL) 25 MG tablet TAKE 3 TABLETS (75MG ) BY MOUTH AT BEDTIME FOR NEUROPATHY 270 tablet 1   atorvastatin (LIPITOR) 20 MG tablet Take 1 tablet by mouth daily.     buPROPion (WELLBUTRIN XL) 150 MG 24 hr tablet Take 1 tablet (150 mg total) by mouth daily. 90 tablet 1   Calcium Carb-Cholecalciferol 600-20 MG-MCG TABS Take 1 tablet by mouth daily.     escitalopram (LEXAPRO) 10 MG tablet TAKE 1 TABLET BY MOUTH DAILY  WITH BREAKFAST. 90 tablet 1   furosemide (LASIX) 20 MG tablet TAKE 1 TABLET BY MOUTH ONCE DAILY AS NEEDED FOR FLUID RETENTION 30 tablet 3   gabapentin (NEURONTIN) 300 MG capsule TAKE 1 CAPSULE BY MOUTH ONCE DAILY 90 capsule 1   meloxicam (MOBIC) 7.5 MG tablet TAKE ONE TABLET BY MOUTH EVERY DAY 30 tablet 5   omeprazole (PRILOSEC) 20 MG capsule Take 1 capsule (20 mg total) by mouth daily. 90 capsule 3   Semaglutide (RYBELSUS) 14 MG TABS Take 14 mg by mouth daily. 90 tablet 1   tiZANidine (ZANAFLEX) 4 MG tablet Take 1 tablet (4 mg total) by mouth every 6 (six) hours as needed for muscle spasms. 30 tablet 3   Facility-Administered Medications Prior to Visit  Medication Dose Route Frequency Provider Last Rate Last Admin   cyanocobalamin ((VITAMIN B-12)) injection 1,000 mcg  1,000 mcg Intramuscular Q30 days [name], MD   1,000 mcg at 11/02/21 1401    Review of Systems  Objective:  BP 126/70 (BP Location: Left Arm, Patient Position: Sitting, Cuff Size: Normal)   Pulse (!) 106   Temp 98.1 F (36.7 C) (Oral)   Ht 5\' 3"  (1.6 m)   Wt 216 lb 6.4 oz (98.2 kg)   SpO2 96%   BMI 38.33 kg/m   Physical Exam  Physical Exam   Assessment & Plan:   Problem List Items Addressed This Visit     Obesity, diabetes, and hypertension syndrome (HCC)    Her last eye exam was Nov 2022: no retinopathy.   ,  Glycemic control HAS BEEN  Excellent  BUT HER weight loss has stopped and she has gained 6 lbs since May despite taking  Rybelsus. Not exercising.    Did not tolerate Ozempic due to injection site pain/redness.  Willing to try [facility] if [facility] pharmacy assistance can get it for her. Encouraged to prioritize time to EXERCISE  .   Lab Results  Component Value Date   HGBA1C 5.6 01/01/2022   Lab Results  Component Value Date   LABMICR 14.9 05/30/2018   MICROALBUR 1.5 07/14/2021   MICROALBUR <0.7 06/23/2020     Lab Results  Component Value Date   CHOL 126 01/01/2022   HDL 35.80 (L) 01/01/2022    LDLCALC 57 01/01/2022   LDLDIRECT 151.0 06/29/2021   TRIG 163.0 (H) 01/01/2022   CHOLHDL 4 01/01/2022            Relevant Orders   Hemoglobin A1c   Hyperlipidemia associated with type 2 diabetes mellitus (HCC)   Encounter for preventive health examination    age appropriate education and counseling updated, referrals for preventative services and immunizations addressed, dietary and smoking counseling addressed, most recent labs reviewed.  I have personally reviewed and have noted:   1) the patient's medical and social history 2) The pt's use of alcohol, tobacco, and illicit drugs 3) The patient's current medications and supplements 4) Functional ability including ADL's, fall risk, home safety risk, hearing and visual impairment 5) Diet and physical activities 6) Evidence for depression or mood disorder 7) The patient's height, weight, and BMI have been recorded in the chart    I have made referrals, and provided counseling and education based on review of the above      Barretts esophagus    surveillance EGD done May 2023: no dysplasia.  Continue PPI       Class 2 severe obesity due to excess calories with serious comorbidity and body mass index (BMI) of 39.0 to 39.9 in adult (HCC)    HER weight loss has stopped and she has gained 6 lbs since May despite taking  Rybelsus. Not exercising.    Did not tolerate Ozempic due to injection site pain/redness.  Willing to try [facility] if [facility] pharmacy assistance can get it for her. Encouraged to prioritize time to EXERCISE  .       Other Visit Diagnoses     Fatigue, unspecified type    -  Primary   Relevant Orders   TSH   CBC with Differential/Platelet       There are no discontinued medications.  Follow-up: No follow-ups on file.   [name], MD

## 2022-06-29 NOTE — Assessment & Plan Note (Addendum)
surveillance EGD done May 2023: no dysplasia.  Continue PPI

## 2022-06-29 NOTE — Assessment & Plan Note (Addendum)
HER weight loss has stopped and she has gained 6 lbs since May despite taking  Rybelsus. Not exercising.    Did not tolerate Ozempic due to injection site pain/redness.  Willing to try [facility] if [facility] pharmacy assistance can get it for her. Encouraged to prioritize time to EXERCISE  .

## 2022-06-29 NOTE — Assessment & Plan Note (Addendum)
Her last eye exam was Nov 2022: no retinopathy.   , Glycemic control HAS BEEN  Excellent  BUT HER weight loss has stopped and she has gained 6 lbs since May despite taking  Rybelsus. Not exercising.    Did not tolerate Ozempic due to injection site pain/redness.  Willing to try [facility] if [facility] pharmacy assistance can get it for her. Encouraged to prioritize time to EXERCISE  .   Lab Results  Component Value Date   HGBA1C 5.6 01/01/2022   Lab Results  Component Value Date   LABMICR 14.9 05/30/2018   MICROALBUR 1.5 07/14/2021   MICROALBUR <0.7 06/23/2020      Lab Results  Component Value Date   CHOL 126 01/01/2022   HDL 35.80 (L) 01/01/2022   LDLCALC 57 01/01/2022   LDLDIRECT 151.0 06/29/2021   TRIG 163.0 (H) 01/01/2022   CHOLHDL 4 01/01/2022

## 2022-06-29 NOTE — Patient Instructions (Signed)
We will try to get your [facility] as a substitute  for rybelsus.  Continue rybelsus until you hear from [name]

## 2022-06-29 NOTE — Assessment & Plan Note (Signed)

## 2022-06-30 ENCOUNTER — Telehealth: Payer: Self-pay | Admitting: Internal Medicine

## 2022-06-30 LAB — TSH: TSH: 1.42 u[IU]/mL (ref 0.35–5.50)

## 2022-06-30 NOTE — Telephone Encounter (Signed)
Copied from [place] [id number]. Topic: Medicare AWV >> Jun 30, 2022 10:50 AM Devoria Glassing wrote: Reason for CRM: Left message for patient to schedule Annual Wellness Visit.  Please schedule with Nurse Health Advisor Denisa O'Brien-Blaney, LPN at [facility]. This appt can be telephone or office visit.  Please call [phone] ask for [facility]

## 2022-07-01 ENCOUNTER — Telehealth: Payer: Self-pay | Admitting: Pharmacist

## 2022-07-01 ENCOUNTER — Telehealth: Payer: Self-pay

## 2022-07-01 NOTE — Telephone Encounter (Signed)
LMTCB. Need to let pt know that the person that reached out to her was the clinical pharmacist, Tiffany Benfield. She will need to call her back at [phone].

## 2022-07-01 NOTE — Telephone Encounter (Signed)
noted 

## 2022-07-01 NOTE — Progress Notes (Signed)
[facility])                                            [place] [facility]    07/01/2022  [place] Aug 08, 1956 [id number]   Placed telephonic outreach to [name] regarding patient assistance for [facility]. Patient is not able to talk at this time due to being in a medical appointment with her husband. Patient will call me back.   Loretha Brasil, PharmD [place] Pharmacist Office: [phone]

## 2022-07-01 NOTE — Telephone Encounter (Signed)
Pt called back and I read the message to her and she stated she already talked to Tiffany about an hour n a half ago

## 2022-07-01 NOTE — Progress Notes (Signed)
[facility])  [place] Team    07/01/2022  [place] 1956/03/22 [id number]  Reason for referral: Medication Assistance  Referral source: Dr. Derrel Nip Current insurance: [employer]  Outreach:  Successful telephone call with [name].  HIPAA identifiers verified.   Objective: The 10-year ASCVD risk score (Arnett DK, et al., 2019) is: 13.1%   Values used to calculate the score:     Age: 66 years     Sex: Female     Is Non-Hispanic African American: No     Diabetic: Yes     Tobacco smoker: No     Systolic Blood Pressure: 165 mmHg     Is BP treated: Yes     HDL Cholesterol: 47.5 mg/dL     Total Cholesterol: 133 mg/dL  Lab Results  Component Value Date   CREATININE 0.59 06/29/2022   CREATININE 0.66 01/01/2022   CREATININE 0.64 06/29/2021    Lab Results  Component Value Date   HGBA1C 5.8 06/29/2022    Lipid Panel     Component Value Date/Time   CHOL 133 06/29/2022 1349   CHOL 113 08/29/2018 0916   TRIG 131.0 06/29/2022 1349   HDL 47.50 06/29/2022 1349   HDL 36 (L) 08/29/2018 0916   CHOLHDL 3 06/29/2022 1349   VLDL 26.2 06/29/2022 1349   LDLCALC 59 06/29/2022 1349   LDLCALC 57 08/29/2018 0916   LDLDIRECT 71.0 06/29/2022 1349    BP Readings from Last 3 Encounters:  06/29/22 126/70  02/09/22 130/80  02/02/22 122/81    No Known Allergies  Assessment: Informed Sheri [name] that at this time Sheri [name] is not offering patient assistance for [facility] for the 2023 year. [facility] is not aware if Sheri [name] will be added to the patient assistance list for 2024. Confirmed with her that Sheri [name] will be working with her to renew the patient assistance for Sheri [name] for the 2024 year.     Plan: Will route note to Dr. Derrel Nip.  Will close [facility] pharmacy case as no further medication needs identified at this time.  Am happy to assist in the future as needed.    Loretha Brasil, PharmD [place] Pharmacist Office: [phone]

## 2022-07-01 NOTE — Telephone Encounter (Signed)
Patient states she is returning our call from today.  I let patient know that I do not see a note from today.  Patient states she spoke with [facility] yesterday.

## 2022-07-05 ENCOUNTER — Other Ambulatory Visit: Payer: Self-pay | Admitting: Internal Medicine

## 2022-07-06 ENCOUNTER — Ambulatory Visit: Payer: PPO

## 2022-07-07 ENCOUNTER — Ambulatory Visit: Payer: PPO | Admitting: Internal Medicine

## 2022-07-09 ENCOUNTER — Ambulatory Visit (INDEPENDENT_AMBULATORY_CARE_PROVIDER_SITE_OTHER): Payer: PPO

## 2022-07-09 ENCOUNTER — Other Ambulatory Visit: Payer: Self-pay | Admitting: Internal Medicine

## 2022-07-09 VITALS — BP 121/85 | HR 94 | Temp 97.7°F | Resp 16 | Ht 63.0 in | Wt 216.2 lb

## 2022-07-09 DIAGNOSIS — Z0000 Encounter for general adult medical examination without abnormal findings: Secondary | ICD-10-CM | POA: Diagnosis not present

## 2022-07-09 DIAGNOSIS — E538 Deficiency of other specified B group vitamins: Secondary | ICD-10-CM

## 2022-07-09 MED ORDER — CYANOCOBALAMIN 1000 MCG/ML IJ SOLN
1000.0000 ug | Freq: Once | INTRAMUSCULAR | Status: AC
  Administered 2022-07-09: 1000 ug via INTRAMUSCULAR

## 2022-07-09 NOTE — Progress Notes (Signed)
Patient arrived for B12 injection. Given in Left arm. Patient tolerated B12 injection well. Patient did not show any signs of distress or voice any concerns.

## 2022-07-09 NOTE — Progress Notes (Addendum)
Subjective:   Sheri [name] is a 66 y.o. female who presents for an Initial Medicare Annual Wellness Visit.  Review of Systems    No ROS.  Medicare Wellness   Cardiac Risk Factors include: advanced age (>55men, >80 women)     Objective:    Today's Vitals   07/09/22 0934  BP: 121/85  Pulse: 94  Resp: 16  Temp: 97.7 F (36.5 C)  SpO2: 98%  Weight: 216 lb 3.2 oz (98.1 kg)  Height: 5\' 3"  (1.6 m)   Body mass index is 38.3 kg/m.     07/09/2022    9:59 AM 02/09/2022    9:36 AM  Advanced Directives  Does Patient Have a Medical Advance Directive? Yes No  Type of Estate agent of [place];Living will   Does patient want to make changes to medical advance directive? No - Patient declined   Copy of Healthcare Power of Attorney in Chart? No - copy requested     Current Medications (verified) Outpatient Encounter Medications as of 07/09/2022  Medication Sig   ALPRAZolam (XANAX) 0.5 MG tablet TAKE ONE TABLET BY MOUTH TWICE DAILY AS NEEDED FOR ANXIETY OR SLEEP   amitriptyline (ELAVIL) 25 MG tablet TAKE 3 TABLETS (75MG ) BY MOUTH AT BEDTIME FOR NEUROPATHY   atorvastatin (LIPITOR) 20 MG tablet Take 1 tablet by mouth daily.   buPROPion (WELLBUTRIN XL) 150 MG 24 hr tablet Take 1 tablet (150 mg total) by mouth daily.   Calcium Carb-Cholecalciferol 600-20 MG-MCG TABS Take 1 tablet by mouth daily.   escitalopram (LEXAPRO) 10 MG tablet TAKE 1 TABLET BY MOUTH DAILY WITH BREAKFAST.   furosemide (LASIX) 20 MG tablet TAKE 1 TABLET BY MOUTH ONCE DAILY AS NEEDED FOR FLUID RETENTION   gabapentin (NEURONTIN) 300 MG capsule TAKE 1 CAPSULE BY MOUTH ONCE DAILY   meloxicam (MOBIC) 7.5 MG tablet TAKE ONE TABLET BY MOUTH EVERY DAY   omeprazole (PRILOSEC) 20 MG capsule Take 1 capsule (20 mg total) by mouth daily.   Semaglutide (RYBELSUS) 14 MG TABS Take 14 mg by mouth daily.   tiZANidine (ZANAFLEX) 4 MG tablet Take 1 tablet (4 mg total) by mouth every 6 (six) hours as needed for  muscle spasms.   Facility-Administered Encounter Medications as of 07/09/2022  Medication   cyanocobalamin ((VITAMIN B-12)) injection 1,000 mcg    Allergies (verified) Patient has no known allergies.   History: Past Medical History:  Diagnosis Date   Abdominal pain, epigastric    Anxiety    Back pain    Diabetes mellitus, type 2 (HCC)    Diverticulosis of colon without hemorrhage    Duodenitis with hemorrhage    Edema    Fluid retention    GERD (gastroesophageal reflux disease)    Heart murmur    Hyperlipidemia    Hypertension    Leg edema    Mitral regurgitation    Obesity, unspecified    Osteopenia    Panic disorder without agoraphobia    Peripheral neuropathy    Personal history of malignant neoplasm of cervix uteri    CIN-3 1989, normal since, gets pap smears annually   Personal history of other genital system and obstetric disorders(V13.29)    Prediabetes    Rash and other nonspecific skin eruption    Reflux esophagitis    Swallowing difficulty    Unspecified hereditary and idiopathic peripheral neuropathy    Unspecified sleep apnea    sleep study- [facility]   Vitamin D deficiency    Past  Surgical History:  Procedure Laterality Date   ABDOMINAL HYSTERECTOMY     BREAST CYST ASPIRATION     right , benign   COLONOSCOPY WITH PROPOFOL N/A 06/09/2018   Procedure: COLONOSCOPY WITH PROPOFOL;  Surgeon: Christena Deem, MD;  Location: [facility] ENDOSCOPY;  Service: Endoscopy;  Laterality: N/A;   DILATION AND CURETTAGE OF UTERUS     ESOPHAGOGASTRODUODENOSCOPY N/A 06/09/2018   Procedure: ESOPHAGOGASTRODUODENOSCOPY (EGD);  Surgeon: Christena Deem, MD;  Location: [facility] ENDOSCOPY;  Service: Endoscopy;  Laterality: N/A;   ESOPHAGOGASTRODUODENOSCOPY (EGD) WITH PROPOFOL N/A 02/09/2022   Procedure: ESOPHAGOGASTRODUODENOSCOPY (EGD) WITH PROPOFOL;  Surgeon: Wyline Mood, MD;  Location: [facility] ENDOSCOPY;  Service: Gastroenterology;  Laterality: N/A;   RIGHT OOPHORECTOMY  2001    menopause, endometriosis s/p.  HRT  since- has not tolerated previous attempts to wean   Family History  Problem Relation Age of Onset   Osteopenia Mother    Hypothyroidism Mother    Diabetes Mother    Hyperlipidemia Mother    Hypertension Mother    Obesity Mother    Diabetes Father    Alzheimer's disease Father    Coronary artery disease Father    Obesity Father    Cancer Neg Hx        breast, ovarian, colon   Breast cancer Neg Hx    Social History   Socioeconomic History   Marital status: Married    Spouse name: Billy Abboud   Number of children: 0   Years of education: 14   Highest education level: Not on file  Occupational History   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         Social Determinants of Health   Financial Resource Strain: Medium Risk (11/13/2021)   Overall Financial Resource Strain (CARDIA)    Difficulty of Paying Living Expenses: Somewhat hard  Food Insecurity: Unknown (06/09/2018)   Hunger Vital Sign    Worried About Running Out of Food in the Last Year: Patient refused    Ran Out of Food in the Last Year: Patient refused  Transportation Needs: No Transportation Needs (07/09/2022)   PRAPARE - Administrator, Civil Service (Medical): No    Lack of Transportation (Non-Medical): No  Physical Activity: Not on file  Stress: No Stress Concern Present (07/09/2022)   [employer] of Occupational Health - Occupational Stress Questionnaire    Feeling of Stress : Not at all  Social Connections: Unknown (07/09/2022)    Social Connection and Isolation Panel [NHANES]    Frequency of Communication with Friends and Family: Not on file    Frequency of Social Gatherings with Friends and Family: Not on file    Attends Religious Services: Not on file    Active Member of Clubs or Organizations: Not on file    Attends Banker Meetings: Not on file    Marital Status: Married    Tobacco Counseling Counseling given: Not Answered   Clinical Intake:  Pre-visit preparation completed: Yes  Diabetes: Yes (Followed by PCP)  Nutrition Risk Assessment: Has the patient had any N/V/D within the last 2 months?  No  Does the patient have any non-healing wounds?  No  Has the patient had any unintentional weight loss or weight gain?  No   Diabetes: Is the patient diabetic?  Yes  If diabetic, was a CBG obtained today?  No  Did the patient bring in their glucometer from home?  No  How often do you monitor your CBG's? Does not monitor at home.   Financial Strains and Diabetes Management: Are you having any financial strains with the device, your supplies or your medication? No .  Does the patient want to be seen by Chronic Care Management for management of their diabetes?  No  Would the patient like to be referred to a Nutritionist or for Diabetic Management?  No   Diabetic Exams:  Diabetic Eye Exam: Completed 07/30/21 Diabetic Foot Exam: Completed 01/05/22    How often do you need to have someone help you when you read instructions, pamphlets, or other written materials from your doctor or pharmacy?: 1 - Never    Interpreter Needed?: No      Activities of Daily Living    07/09/2022   10:02 AM  In your present state of health, do you have any difficulty performing the following activities:  Hearing? 0  Vision? 0  Difficulty concentrating or making decisions? 0  Walking or climbing stairs? 0  Dressing or bathing? 0  Doing errands, shopping? 0  Preparing Food and eating ? N  Using  the Toilet? N  In the past six months, have you accidently leaked urine? N  Do you have problems with loss of bowel control? N  Managing your Medications? N  Managing your Finances? N  Housekeeping or managing your Housekeeping? N    Patient Care Team: [name], MD as PCP - General (Internal Medicine)  Indicate any recent Medical Services you may have received from other than [facility] providers in the past year (date may be approximate).     Assessment:   This is a routine wellness examination for Sheri [name].  Hearing/Vision screen Hearing Screening - Comments:: Patient is able to hear conversational tones without difficulty.  No issues reported.   Vision Screening - Comments:: Followed by [facility] Wears corrective lenses They have seen their ophthalmologist in the last 12 months.    Dietary issues and exercise activities discussed: Current Exercise Habits: Home exercise routine, Intensity: Mild  She tries to have a healthy diet Weight watchers Low carb Good water intake    Goals Addressed             This Visit's Progress    I would like to lose weight       Stay active Put myself first more often Healthy diet       Depression Screen    07/09/2022    9:42 AM 06/29/2022    4:47 PM 06/29/2022    1:03 PM 01/05/2022    1:32 PM 06/29/2021   10:09 AM 03/27/2021    8:12 AM 01/19/2021    8:12 AM  PHQ 2/9 Scores  PHQ - 2 Score 0 0 0 0 0 1 4  PHQ- 9 Score 0 1  0 0 3 9    Fall Risk    07/09/2022   10:15 AM 06/29/2022    1:02 PM 01/05/2022    1:32 PM 06/29/2021   10:08 AM 03/27/2021  8:11 AM  Fall Risk   Falls in the past year? 0 0 0 1 1  Number falls in past yr: 0 0  0 0  Injury with Fall? 0 0  1 1  Risk for fall due to : No Fall Risks No Fall Risks No Fall Risks History of fall(s)   Follow up Falls evaluation completed Falls evaluation completed Falls evaluation completed Falls evaluation completed Falls evaluation completed    FALL RISK  PREVENTION PERTAINING TO THE HOME: Home free of loose throw rugs in walkways, pet beds, electrical cords, etc? Yes  Adequate lighting in your home to reduce risk of falls? Yes   ASSISTIVE DEVICES UTILIZED TO PREVENT FALLS: Life alert? No  Use of a cane, walker or w/c? No  Grab bars in the bathroom? Not in use Shower chair or bench in shower? Not in use Elevated toilet seat or a handicapped toilet? Not in use  TIMED UP AND GO: Was the test performed? Yes .  Length of time to ambulate 10 feet: 10 sec.   Gait steady and fast without use of assistive device  Cognitive Function:        07/09/2022   10:03 AM 06/29/2021   10:45 AM  6CIT Screen  What Year? 0 points 0 points  What month? 0 points 0 points  What time? 0 points 0 points  Count back from 20 0 points 0 points  Months in reverse 0 points 0 points  Repeat phrase 0 points 0 points  Total Score 0 points 0 points    Immunizations Immunization History  Administered Date(s) Administered   Fluad Quad(high Dose 65+) 06/29/2021, 06/29/2022   Influenza Split 05/29/2013, 06/03/2014   Influenza-Unspecified 06/27/2015, 06/25/2016, 06/25/2017, 06/25/2018, 06/21/2019   PFIZER(Purple Top)SARS-COV-2 Vaccination 09/11/2019, 10/02/2019, 07/15/2020   PNEUMOCOCCAL CONJUGATE-20 06/29/2021   Pneumococcal Polysaccharide-23 09/23/2014   Td 06/23/2020   Tdap 05/29/2010   Zoster Recombinat (Shingrix) 03/27/2021, 10/08/2021   Covid-19 vaccine status: Completed vaccines x3  Screening Tests Health Maintenance  Topic Date Due   COVID-19 Vaccine (4 - Pfizer series) 07/25/2022 (Originally 09/09/2020)   OPHTHALMOLOGY EXAM  07/30/2022   HEMOGLOBIN A1C  12/29/2022   FOOT EXAM  01/06/2023   Diabetic kidney evaluation - GFR measurement  06/30/2023   Diabetic kidney evaluation - Urine ACR  06/30/2023   MAMMOGRAM  08/05/2023   COLONOSCOPY (Pts 45-35yrs Insurance coverage will need to be confirmed)  06/09/2028   TETANUS/TDAP  06/23/2030    Pneumonia Vaccine 36+ Years old  Completed   INFLUENZA VACCINE  Completed   DEXA SCAN  Completed   Hepatitis C Screening  Completed   Zoster Vaccines- Shingrix  Completed   HPV VACCINES  Aged Out    Health Maintenance There are no preventive care reminders to display for this patient.  Mammogram- scheduled 08/05/22  Bone density- completed 08/04/21  Lung Cancer Screening: (Low Dose CT Chest recommended if Age 4-80 years, 30 pack-year currently smoking OR have quit w/in 15years.) does not qualify.   Hepatitis C Screening: Completed 2017.  Vision Screening: Recommended annual ophthalmology exams for early detection of glaucoma and other disorders of the eye.  Dental Screening: Recommended annual dental exams for proper oral hygiene  Community Resource Referral / Chronic Care Management: CRR required this visit?  No   CCM required this visit?  No      Plan:     I have personally reviewed and noted the following in the patient's chart:   Medical and social  history Use of alcohol, tobacco or illicit drugs  Current medications and supplements including opioid prescriptions. Patient is not currently taking opioid prescriptions. Functional ability and status Nutritional status Physical activity Advanced directives List of other physicians Hospitalizations, surgeries, and ER visits in previous 12 months Vitals Screenings to include cognitive, depression, and falls Referrals and appointments  In addition, I have reviewed and discussed with patient certain preventive protocols, quality metrics, and best practice recommendations. A written personalized care plan for preventive services as well as general preventive health recommendations were provided to patient.     Donta Fuster L Motley, LPN   [id number]/06/[id number]     I have reviewed the above information and agree with above.   Duncan Dull, MD

## 2022-07-09 NOTE — Patient Instructions (Addendum)
Sheri [name] , Thank you for taking time to come for your Medicare Wellness Visit. I appreciate your ongoing commitment to your health goals. Please review the following plan we discussed and let me know if I can assist you in the future.   These are the goals we discussed:  Goals      I would like to lose weight     Stay active Put myself first more often Healthy diet        This is a list of the screening recommended for you and due dates:  Health Maintenance  Topic Date Due   COVID-19 Vaccine (4 - Gonzalez series) 07/25/2022*   Eye exam for diabetics  07/30/2022   Hemoglobin A1C  12/29/2022   Complete foot exam   01/06/2023   Yearly kidney function blood test for diabetes  06/30/2023   Yearly kidney health urinalysis for diabetes  06/30/2023   Mammogram  08/05/2023   Colon Cancer Screening  06/09/2028   Tetanus Vaccine  06/23/2030   Pneumonia Vaccine  Completed   Flu Shot  Completed   DEXA scan (bone density measurement)  Completed   Hepatitis C Screening: USPSTF Recommendation to screen - Ages 65-79 yo.  Completed   Zoster (Shingles) Vaccine  Completed   HPV Vaccine  Aged Out  *Topic was postponed. The date shown is not the original due date.    Advanced directives: End of life planning; Advance aging; Advanced directives discussed.  Copy of current HCPOA/Living Will requested.    Conditions/risks identified: none new  Next appointment: Follow up in one year for your annual wellness visit    Preventive Care 65 Years and Older, Female Preventive care refers to lifestyle choices and visits with your health care provider that can promote health and wellness. What does preventive care include? A yearly physical exam. This is also called an annual well check. Dental exams once or twice a year. Routine eye exams. Ask your health care provider how often you should have your eyes checked. Personal lifestyle choices, including: Daily care of your teeth and gums. Regular  physical activity. Eating a healthy diet. Avoiding tobacco and drug use. Limiting alcohol use. Practicing safe sex. Taking low-dose aspirin every day. Taking vitamin and mineral supplements as recommended by your health care provider. What happens during an annual well check? The services and screenings done by your health care provider during your annual well check will depend on your age, overall health, lifestyle risk factors, and family history of disease. Counseling  Your health care provider may ask you questions about your: Alcohol use. Tobacco use. Drug use. Emotional well-being. Home and relationship well-being. Sexual activity. Eating habits. History of falls. Memory and ability to understand (cognition). Work and work Statistician. Reproductive health. Screening  You may have the following tests or measurements: Height, weight, and BMI. Blood pressure. Lipid and cholesterol levels. These may be checked every 5 years, or more frequently if you are over 59 years old. Skin check. Lung cancer screening. You may have this screening every year starting at age 28 if you have a 30-pack-year history of smoking and currently smoke or have quit within the past 15 years. Fecal occult blood test (FOBT) of the stool. You may have this test every year starting at age 86. Flexible sigmoidoscopy or colonoscopy. You may have a sigmoidoscopy every 5 years or a colonoscopy every 10 years starting at age 79. Hepatitis C blood test. Hepatitis B blood test. Sexually transmitted disease (STD) testing.  Diabetes screening. This is done by checking your blood sugar (glucose) after you have not eaten for a while (fasting). You may have this done every 1-3 years. Bone density scan. This is done to screen for osteoporosis. You may have this done starting at age 18. Mammogram. This may be done every 1-2 years. Talk to your health care provider about how often you should have regular mammograms. Talk  with your health care provider about your test results, treatment options, and if necessary, the need for more tests. Vaccines  Your health care provider may recommend certain vaccines, such as: Influenza vaccine. This is recommended every year. Tetanus, diphtheria, and acellular pertussis (Tdap, Td) vaccine. You may need a Td booster every 10 years. Zoster vaccine. You may need this after age 61. Pneumococcal 13-valent conjugate (PCV13) vaccine. One dose is recommended after age 38. Pneumococcal polysaccharide (PPSV23) vaccine. One dose is recommended after age 40. Talk to your health care provider about which screenings and vaccines you need and how often you need them. This information is not intended to replace advice given to you by your health care provider. Make sure you discuss any questions you have with your health care provider. Document Released: 10/03/2015 Document Revised: 05/26/2016 Document Reviewed: 07/08/2015 Elsevier Interactive Patient Education  2017 [place] Prevention in the Home Falls can cause injuries. They can happen to people of all ages. There are many things you can do to make your home safe and to help prevent falls. What can I do on the outside of my home? Regularly fix the edges of walkways and driveways and fix any cracks. Remove anything that might make you trip as you walk through a door, such as a raised step or threshold. Trim any bushes or trees on the path to your home. Use bright outdoor lighting. Clear any walking paths of anything that might make someone trip, such as rocks or tools. Regularly check to see if handrails are loose or broken. Make sure that both sides of any steps have handrails. Any raised decks and porches should have guardrails on the edges. Have any leaves, snow, or ice cleared regularly. Use sand or salt on walking paths during winter. Clean up any spills in your garage right away. This includes oil or grease  spills. What can I do in the bathroom? Use night lights. Install grab bars by the toilet and in the tub and shower. Do not use towel bars as grab bars. Use non-skid mats or decals in the tub or shower. If you need to sit down in the shower, use a plastic, non-slip stool. Keep the floor dry. Clean up any water that spills on the floor as soon as it happens. Remove soap buildup in the tub or shower regularly. Attach bath mats securely with double-sided non-slip rug tape. Do not have throw rugs and other things on the floor that can make you trip. What can I do in the bedroom? Use night lights. Make sure that you have a light by your bed that is easy to reach. Do not use any sheets or blankets that are too big for your bed. They should not hang down onto the floor. Have a firm chair that has side arms. You can use this for support while you get dressed. Do not have throw rugs and other things on the floor that can make you trip. What can I do in the kitchen? Clean up any spills right away. Avoid walking on wet floors.  Keep items that you use a lot in easy-to-reach places. If you need to reach something above you, use a strong step stool that has a grab bar. Keep electrical cords out of the way. Do not use floor polish or wax that makes floors slippery. If you must use wax, use non-skid floor wax. Do not have throw rugs and other things on the floor that can make you trip. What can I do with my stairs? Do not leave any items on the stairs. Make sure that there are handrails on both sides of the stairs and use them. Fix handrails that are broken or loose. Make sure that handrails are as long as the stairways. Check any carpeting to make sure that it is firmly attached to the stairs. Fix any carpet that is loose or worn. Avoid having throw rugs at the top or bottom of the stairs. If you do have throw rugs, attach them to the floor with carpet tape. Make sure that you have a light switch at the  top of the stairs and the bottom of the stairs. If you do not have them, ask someone to add them for you. What else can I do to help prevent falls? Wear shoes that: Do not have high heels. Have rubber bottoms. Are comfortable and fit you well. Are closed at the toe. Do not wear sandals. If you use a stepladder: Make sure that it is fully opened. Do not climb a closed stepladder. Make sure that both sides of the stepladder are locked into place. Ask someone to hold it for you, if possible. Clearly mark and make sure that you can see: Any grab bars or handrails. First and last steps. Where the edge of each step is. Use tools that help you move around (mobility aids) if they are needed. These include: Canes. Walkers. Scooters. Crutches. Turn on the lights when you go into a dark area. Replace any light bulbs as soon as they burn out. Set up your furniture so you have a clear path. Avoid moving your furniture around. If any of your floors are uneven, fix them. If there are any pets around you, be aware of where they are. Review your medicines with your doctor. Some medicines can make you feel dizzy. This can increase your chance of falling. Ask your doctor what other things that you can do to help prevent falls. This information is not intended to replace advice given to you by your health care provider. Make sure you discuss any questions you have with your health care provider. Document Released: 07/03/2009 Document Revised: 02/12/2016 Document Reviewed: 10/11/2014 Elsevier Interactive Patient Education  2017 [employer].

## 2022-07-20 DIAGNOSIS — E785 Hyperlipidemia, unspecified: Secondary | ICD-10-CM | POA: Diagnosis not present

## 2022-07-20 DIAGNOSIS — F3342 Major depressive disorder, recurrent, in full remission: Secondary | ICD-10-CM | POA: Diagnosis not present

## 2022-07-20 DIAGNOSIS — Z6841 Body Mass Index (BMI) 40.0 and over, adult: Secondary | ICD-10-CM | POA: Diagnosis not present

## 2022-07-20 DIAGNOSIS — F419 Anxiety disorder, unspecified: Secondary | ICD-10-CM | POA: Diagnosis not present

## 2022-07-20 DIAGNOSIS — E1169 Type 2 diabetes mellitus with other specified complication: Secondary | ICD-10-CM | POA: Diagnosis not present

## 2022-07-20 DIAGNOSIS — G8929 Other chronic pain: Secondary | ICD-10-CM | POA: Diagnosis not present

## 2022-07-20 DIAGNOSIS — G4700 Insomnia, unspecified: Secondary | ICD-10-CM | POA: Diagnosis not present

## 2022-07-20 DIAGNOSIS — H259 Unspecified age-related cataract: Secondary | ICD-10-CM | POA: Diagnosis not present

## 2022-07-20 DIAGNOSIS — K219 Gastro-esophageal reflux disease without esophagitis: Secondary | ICD-10-CM | POA: Diagnosis not present

## 2022-07-20 DIAGNOSIS — E1142 Type 2 diabetes mellitus with diabetic polyneuropathy: Secondary | ICD-10-CM | POA: Diagnosis not present

## 2022-07-20 DIAGNOSIS — E1136 Type 2 diabetes mellitus with diabetic cataract: Secondary | ICD-10-CM | POA: Diagnosis not present

## 2022-07-30 LAB — HM DIABETES EYE EXAM

## 2022-08-05 ENCOUNTER — Ambulatory Visit
Admission: RE | Admit: 2022-08-05 | Discharge: 2022-08-05 | Disposition: A | Discharge status: Home / Self Care | Payer: PPO | Source: Ambulatory Visit | Attending: Internal Medicine | Admitting: Internal Medicine

## 2022-08-05 DIAGNOSIS — Z1231 Encounter for screening mammogram for malignant neoplasm of breast: Secondary | ICD-10-CM | POA: Diagnosis not present

## 2022-08-09 ENCOUNTER — Other Ambulatory Visit: Payer: Self-pay | Admitting: Internal Medicine

## 2022-08-09 DIAGNOSIS — R928 Other abnormal and inconclusive findings on diagnostic imaging of breast: Secondary | ICD-10-CM

## 2022-08-09 DIAGNOSIS — R921 Mammographic calcification found on diagnostic imaging of breast: Secondary | ICD-10-CM

## 2022-08-10 ENCOUNTER — Ambulatory Visit: Payer: PPO

## 2022-08-10 NOTE — Progress Notes (Signed)
Pt presented for their vitamin B12 injection. Pt was identified through two identifiers. Pt tolerated shot well in their right deltoid.    [place], CMA 

## 2022-08-18 ENCOUNTER — Ambulatory Visit
Admission: RE | Admit: 2022-08-18 | Discharge: 2022-08-18 | Disposition: A | Discharge status: Home / Self Care | Payer: PPO | Source: Ambulatory Visit | Attending: Internal Medicine | Admitting: Internal Medicine

## 2022-08-18 DIAGNOSIS — R921 Mammographic calcification found on diagnostic imaging of breast: Secondary | ICD-10-CM | POA: Diagnosis not present

## 2022-08-18 DIAGNOSIS — R928 Other abnormal and inconclusive findings on diagnostic imaging of breast: Secondary | ICD-10-CM | POA: Diagnosis not present

## 2022-08-19 ENCOUNTER — Other Ambulatory Visit: Payer: Self-pay | Admitting: Internal Medicine

## 2022-08-19 DIAGNOSIS — R928 Other abnormal and inconclusive findings on diagnostic imaging of breast: Secondary | ICD-10-CM

## 2022-08-19 DIAGNOSIS — R921 Mammographic calcification found on diagnostic imaging of breast: Secondary | ICD-10-CM

## 2022-09-01 ENCOUNTER — Ambulatory Visit
Admission: RE | Admit: 2022-09-01 | Discharge: 2022-09-01 | Disposition: A | Discharge status: Home / Self Care | Payer: PPO | Source: Ambulatory Visit | Attending: Internal Medicine | Admitting: Internal Medicine

## 2022-09-01 DIAGNOSIS — R928 Other abnormal and inconclusive findings on diagnostic imaging of breast: Secondary | ICD-10-CM | POA: Insufficient documentation

## 2022-09-01 DIAGNOSIS — R921 Mammographic calcification found on diagnostic imaging of breast: Secondary | ICD-10-CM | POA: Insufficient documentation

## 2022-09-01 DIAGNOSIS — D242 Benign neoplasm of left breast: Secondary | ICD-10-CM | POA: Diagnosis not present

## 2022-09-01 HISTORY — PX: BREAST BIOPSY: SHX20

## 2022-09-01 MED ORDER — LIDOCAINE HCL (PF) 1 % IJ SOLN
10.0000 mL | Freq: Once | INTRAMUSCULAR | Status: AC
  Administered 2022-09-01: 10 mL
  Filled 2022-09-01: qty 10

## 2022-09-01 MED ORDER — LIDOCAINE-EPINEPHRINE 1 %-1:100000 IJ SOLN
10.0000 mL | Freq: Once | INTRAMUSCULAR | Status: AC
  Administered 2022-09-01: 10 mL
  Filled 2022-09-01: qty 10

## 2022-09-01 NOTE — Telephone Encounter (Signed)
MyChart messgae sent to patient. 

## 2022-09-02 LAB — SURGICAL PATHOLOGY

## 2022-09-09 ENCOUNTER — Ambulatory Visit (INDEPENDENT_AMBULATORY_CARE_PROVIDER_SITE_OTHER): Payer: PPO

## 2022-09-09 DIAGNOSIS — E538 Deficiency of other specified B group vitamins: Secondary | ICD-10-CM | POA: Diagnosis not present

## 2022-09-09 MED ORDER — CYANOCOBALAMIN 1000 MCG/ML IJ SOLN
1000.0000 ug | Freq: Once | INTRAMUSCULAR | Status: AC
  Administered 2022-09-09: 1000 ug via INTRAMUSCULAR

## 2022-09-09 NOTE — Progress Notes (Signed)
Pt presented for their vitamin B12 injection. Pt was identified through two identifiers. Pt tolerated shot well in their right deltoid.  

## 2022-09-28 ENCOUNTER — Other Ambulatory Visit: Payer: Self-pay | Admitting: Internal Medicine

## 2022-10-08 ENCOUNTER — Other Ambulatory Visit: Payer: Self-pay | Admitting: Internal Medicine

## 2022-10-11 ENCOUNTER — Ambulatory Visit (INDEPENDENT_AMBULATORY_CARE_PROVIDER_SITE_OTHER): Payer: PPO

## 2022-10-11 DIAGNOSIS — E538 Deficiency of other specified B group vitamins: Secondary | ICD-10-CM

## 2022-10-11 NOTE — Progress Notes (Signed)
Pt presented for their vitamin B12 injection. Pt was identified through two identifiers. Pt tolerated shot well in their left  deltoid.  

## 2022-10-12 ENCOUNTER — Other Ambulatory Visit: Payer: Self-pay | Admitting: Internal Medicine

## 2022-11-03 DIAGNOSIS — L538 Other specified erythematous conditions: Secondary | ICD-10-CM | POA: Diagnosis not present

## 2022-11-03 DIAGNOSIS — L820 Inflamed seborrheic keratosis: Secondary | ICD-10-CM | POA: Diagnosis not present

## 2022-11-03 DIAGNOSIS — L298 Other pruritus: Secondary | ICD-10-CM | POA: Diagnosis not present

## 2022-11-03 DIAGNOSIS — L578 Other skin changes due to chronic exposure to nonionizing radiation: Secondary | ICD-10-CM | POA: Diagnosis not present

## 2022-11-11 ENCOUNTER — Ambulatory Visit (INDEPENDENT_AMBULATORY_CARE_PROVIDER_SITE_OTHER): Payer: PPO

## 2022-11-11 DIAGNOSIS — E538 Deficiency of other specified B group vitamins: Secondary | ICD-10-CM

## 2022-11-11 MED ORDER — CYANOCOBALAMIN 1000 MCG/ML IJ SOLN
1000.0000 ug | Freq: Once | INTRAMUSCULAR | Status: AC
  Administered 2022-11-15: 1000 ug via INTRAMUSCULAR

## 2022-11-11 NOTE — Progress Notes (Cosign Needed Addendum)
Patient arrived for her B12 injection. Patient was administered a B12 injection into her Left deltoid. Patient tolerated the injection well and did not voice any concerns or show any signs of distress.

## 2022-11-18 ENCOUNTER — Encounter: Payer: Self-pay | Admitting: Internal Medicine

## 2022-11-18 ENCOUNTER — Telehealth: Payer: Self-pay

## 2022-11-19 ENCOUNTER — Encounter: Payer: Self-pay | Admitting: Nurse Practitioner

## 2022-11-19 ENCOUNTER — Ambulatory Visit (INDEPENDENT_AMBULATORY_CARE_PROVIDER_SITE_OTHER): Payer: PPO | Admitting: Nurse Practitioner

## 2022-11-19 VITALS — BP 126/84 | HR 108 | Temp 97.8°F | Ht 63.0 in | Wt 211.8 lb

## 2022-11-19 DIAGNOSIS — K117 Disturbances of salivary secretion: Secondary | ICD-10-CM | POA: Diagnosis not present

## 2022-11-19 LAB — URINALYSIS, ROUTINE W REFLEX MICROSCOPIC
Bilirubin Urine: NEGATIVE
Hgb urine dipstick: NEGATIVE
Ketones, ur: NEGATIVE
Nitrite: NEGATIVE
Specific Gravity, Urine: <=1.005 — AB (ref 1.000–1.030)
Total Protein, Urine: NEGATIVE
Urine Glucose: NEGATIVE
Urobilinogen, UA: 0.2 (ref 0.0–1.0)
pH: 6 (ref 5.0–8.0)

## 2022-11-19 LAB — CBC WITH DIFFERENTIAL/PLATELET
Basophils Absolute: 0.1 10*3/uL (ref 0.0–0.1)
Basophils Relative: 0.6 % (ref 0.0–3.0)
Eosinophils Absolute: 0.1 10*3/uL (ref 0.0–0.7)
Eosinophils Relative: 1.2 % (ref 0.0–5.0)
HCT: 43.9 % (ref 36.0–46.0)
Hemoglobin: 15 g/dL (ref 12.0–15.0)
Lymphocytes Relative: 18.5 % (ref 12.0–46.0)
Lymphs Abs: 1.5 10*3/uL (ref 0.7–4.0)
MCHC: 34.1 g/dL (ref 30.0–36.0)
MCV: 85.8 fl (ref 78.0–100.0)
Monocytes Absolute: 0.7 10*3/uL (ref 0.1–1.0)
Monocytes Relative: 8.2 % (ref 3.0–12.0)
Neutro Abs: 5.9 10*3/uL (ref 1.4–7.7)
Neutrophils Relative %: 71.5 % (ref 43.0–77.0)
Platelets: 285 10*3/uL (ref 150.0–400.0)
RBC: 5.12 Mil/uL — ABNORMAL HIGH (ref 3.87–5.11)
RDW: 12.5 % (ref 11.5–15.5)
WBC: 8.3 10*3/uL (ref 4.0–10.5)

## 2022-11-19 LAB — TSH: TSH: 1.54 u[IU]/mL (ref 0.35–5.50)

## 2022-11-19 LAB — COMPREHENSIVE METABOLIC PANEL
ALT: 23 U/L (ref 0–35)
AST: 18 U/L (ref 0–37)
Albumin: 4.4 g/dL (ref 3.5–5.2)
Alkaline Phosphatase: 86 U/L (ref 39–117)
BUN: 13 mg/dL (ref 6–23)
CO2: 22 mEq/L (ref 19–32)
Calcium: 9.7 mg/dL (ref 8.4–10.5)
Chloride: 104 mEq/L (ref 96–112)
Creatinine, Ser: 0.71 mg/dL (ref 0.40–1.20)
GFR: 88.47 mL/min (ref >60.00)
Glucose, Bld: 93 mg/dL (ref 70–99)
Potassium: 3.8 mEq/L (ref 3.5–5.1)
Sodium: 137 mEq/L (ref 135–145)
Total Bilirubin: 1.4 mg/dL — ABNORMAL HIGH (ref 0.2–1.2)
Total Protein: 7 g/dL (ref 6.0–8.3)

## 2022-11-19 NOTE — Progress Notes (Unsigned)
Established Patient Office Visit  Subjective:  Patient ID: Sheri [name], female    DOB: [birth year]  Age: 67 y.o. MRN: [id number]  CC:  Chief Complaint  Patient presents with   Acute Visit    Dry mouth x 1 week No taste    HPI  Sheri [name] presents for dry mouth since last 3 weeks . She is waking up at night because of dry mouth.  She also reports redness on tongue and gums and pain when trying to eat.   She states that the new medication that she was started on was Rybelsus  and has been using it for more than a year ago. Patient reports that she had dry mouth since she started taking Rybelsus but has been increased in last month. Her last A1C 5.8 on 06/29/22  She takes furosemide once or twice in 2 weeks and has been using amitriptyline from years.  Denies dry eyes,  HPI   Past Medical History:  Diagnosis Date   Abdominal pain, epigastric    Anxiety    Back pain    Diabetes mellitus, type 2 (HCC)    Diverticulosis of colon without hemorrhage    Duodenitis with hemorrhage    Edema    Fluid retention    GERD (gastroesophageal reflux disease)    Heart murmur    Hyperlipidemia    Hypertension    Leg edema    Mitral regurgitation    Obesity, unspecified    Osteopenia    Panic disorder without agoraphobia    Peripheral neuropathy    Personal history of malignant neoplasm of cervix uteri    CIN-3 1989, normal since, gets pap smears annually   Personal history of other genital system and obstetric disorders(V13.29)    Prediabetes    Rash and other nonspecific skin eruption    Reflux esophagitis    Swallowing difficulty    Unspecified hereditary and idiopathic peripheral neuropathy    Unspecified sleep apnea    sleep study- [facility]   Vitamin D deficiency     Past Surgical History:  Procedure Laterality Date   ABDOMINAL HYSTERECTOMY     BREAST BIOPSY Left 09/01/2022   Left Breast Stereo Bx ribbon clip , path pending   BREAST BIOPSY Left 09/01/2022   MM LT  BREAST BX W LOC DEV 1ST LESION IMAGE BX SPEC STEREO GUIDE 09/01/2022 [facility]   BREAST CYST ASPIRATION     right , benign   COLONOSCOPY WITH PROPOFOL N/A 06/09/2018   Procedure: COLONOSCOPY WITH PROPOFOL;  Surgeon: Christena Deem, MD;  Location: [facility] ENDOSCOPY;  Service: Endoscopy;  Laterality: N/A;   DILATION AND CURETTAGE OF UTERUS     ESOPHAGOGASTRODUODENOSCOPY N/A 06/09/2018   Procedure: ESOPHAGOGASTRODUODENOSCOPY (EGD);  Surgeon: Christena Deem, MD;  Location: [facility] ENDOSCOPY;  Service: Endoscopy;  Laterality: N/A;   ESOPHAGOGASTRODUODENOSCOPY (EGD) WITH PROPOFOL N/A 02/09/2022   Procedure: ESOPHAGOGASTRODUODENOSCOPY (EGD) WITH PROPOFOL;  Surgeon: Wyline Mood, MD;  Location: [facility] ENDOSCOPY;  Service: Gastroenterology;  Laterality: N/A;   RIGHT OOPHORECTOMY  2001   menopause, endometriosis s/p.  HRT  since- has not tolerated previous attempts to wean    Family History  Problem Relation Age of Onset   Osteopenia Mother    Hypothyroidism Mother    Diabetes Mother    Hyperlipidemia Mother    Hypertension Mother    Obesity Mother    Diabetes Father    Alzheimer's disease Father    Coronary artery disease Father    Obesity  Father    Cancer Neg Hx        breast, ovarian, colon   Breast cancer Neg Hx     Social History   Socioeconomic History   Marital status: Married    Spouse name: Billy Calk   Number of children: 0   Years of education: 14   Highest education level: Not on file  Occupational History   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         Social Determinants of Health   Financial Resource Strain: Medium Risk (11/13/2021)   Overall Financial Resource Strain (CARDIA)    Difficulty of Paying Living Expenses: Somewhat hard  Food Insecurity: Unknown (06/09/2018)   Hunger Vital Sign    Worried About Running Out of Food in the Last Year: Patient refused    Ran Out of Food in the Last Year: Patient refused  Transportation Needs: No Transportation Needs (07/09/2022)   PRAPARE - Administrator, Civil Service (Medical): No    Lack of Transportation (Non-Medical): No  Physical Activity: Not on file  Stress: No Stress Concern Present (07/09/2022)   [employer] of Occupational Health - Occupational Stress Questionnaire    Feeling of Stress : Not at all  Social Connections: Unknown (07/09/2022)   Social Connection and Isolation Panel [NHANES]    Frequency of Communication with Friends and Family: Not on file    Frequency of Social Gatherings with Friends and Family: Not on file    Attends Religious Services: Not on file    Active Member of Clubs or Organizations: Not on file    Attends Banker Meetings: Not on file    Marital Status: Married  Intimate Partner Violence: Not At Risk (07/09/2022)   Humiliation, Afraid, Rape, and Kick questionnaire    Fear of Current or Ex-Partner: No    Emotionally Abused: No    Physically Abused: No    Sexually Abused: No     Outpatient Medications Prior to Visit  Medication Sig Dispense Refill   ALPRAZolam (XANAX) 0.5 MG tablet TAKE ONE TABLET BY MOUTH TWICE DAILY AS NEEDED FOR ANXIETY OR SLEEP 45 tablet 5   amitriptyline (ELAVIL) 25 MG tablet TAKE 3 TABLETS BY MOUTH AT BEDTIME AS DIRECTED FOR NEUROPATHY 270 tablet 1   atorvastatin (LIPITOR) 20 MG tablet Take 1 tablet by mouth daily.     atorvastatin (LIPITOR) 40 MG tablet TAKE 1 TABLET BY MOUTH DAILY 90 tablet 3   buPROPion (WELLBUTRIN XL) 150 MG 24 hr tablet Take 1 tablet (150 mg total) by  mouth daily. 90 tablet 1   Calcium Carb-Cholecalciferol 600-20 MG-MCG TABS Take 1 tablet by mouth daily.     escitalopram (LEXAPRO) 10 MG tablet TAKE 1 TABLET BY MOUTH DAILY WITH BREAKFAST. 90 tablet 1   furosemide (LASIX) 20 MG tablet TAKE 1 TABLET BY MOUTH ONCE DAILY AS NEEDED FOR FLUID RETENTION 30 tablet 3   gabapentin (NEURONTIN) 300 MG capsule TAKE 1 CAPSULE BY  MOUTH ONCE DAILY 90 capsule 1   meloxicam (MOBIC) 7.5 MG tablet TAKE ONE TABLET BY MOUTH EVERY DAY 30 tablet 5   omeprazole (PRILOSEC) 20 MG capsule Take 1 capsule (20 mg total) by mouth daily. 90 capsule 3   Semaglutide (RYBELSUS) 14 MG TABS Take 14 mg by mouth daily. 90 tablet 1   tiZANidine (ZANAFLEX) 4 MG tablet Take 1 tablet (4 mg total) by mouth every 6 (six) hours as needed for muscle spasms. 30 tablet 3   Facility-Administered Medications Prior to Visit  Medication Dose Route Frequency Provider Last Rate Last Admin   cyanocobalamin ((VITAMIN B-12)) injection 1,000 mcg  1,000 mcg Intramuscular Q30 days [name], MD   1,000 mcg at 10/11/22 0825    No Known Allergies  ROS Review of Systems  Constitutional: Negative.   HENT:  Negative for dental problem, drooling, mouth sores and sinus pressure.        Dry mouth  Eyes: Negative.   Respiratory:  Negative for chest tightness and shortness of breath.   Cardiovascular:  Negative for leg swelling.  Gastrointestinal: Negative.   Neurological:  Negative for dizziness and headaches.  Psychiatric/Behavioral: Negative.        Objective:    Physical Exam Constitutional:      Appearance: Normal appearance. She is normal weight.  HENT:     Right Ear: Tympanic membrane normal.     Left Ear: Tympanic membrane normal.     Nose: Nose normal.     Mouth/Throat:     Mouth: Mucous membranes are moist.     Pharynx: Oropharynx is clear. No oropharyngeal exudate.     Tonsils: No tonsillar exudate.     Comments: She is sipping water frequently.  Cardiovascular:     Rate  and Rhythm: Normal rate and regular rhythm.     Pulses: Normal pulses.     Heart sounds: Normal heart sounds.  Pulmonary:     Effort: Pulmonary effort is normal.     Breath sounds: Normal breath sounds. No wheezing.  Skin:    General: Skin is warm.     Capillary Refill: Capillary refill takes less than 2 seconds.  Neurological:     General: No focal deficit present.     Mental Status: She is alert and oriented to person, place, and time. Mental status is at baseline.  Psychiatric:        Mood and Affect: Mood normal.        Behavior: Behavior normal.        Thought Content: Thought content normal.        Judgment: Judgment normal.     BP 126/84   Pulse (!) 108   Temp 97.8 F (36.6 C)   Ht 5\' 3"  (1.6 m)   Wt 211 lb 12.8 oz (96.1 kg)   SpO2 99%   BMI 37.52 kg/m  Wt Readings from Last 3 Encounters:  11/19/22 211 lb 12.8 oz (96.1 kg)  07/09/22 216 lb 3.2 oz (98.1 kg)  06/29/22 216 lb 6.4 oz (98.2 kg)     Health Maintenance  Topic Date Due   COVID-19 Vaccine (4 - 2023-24 season) 05/21/2022   HEMOGLOBIN A1C  12/29/2022   FOOT EXAM  01/06/2023   Diabetic kidney evaluation - eGFR measurement  06/30/2023   Diabetic kidney evaluation - Urine ACR  06/30/2023   Medicare Annual Wellness (AWV)  07/10/2023   OPHTHALMOLOGY EXAM  07/31/2023   MAMMOGRAM  08/05/2024   COLONOSCOPY (Pts 45-30yrs  Insurance coverage will need to be confirmed)  06/09/2028   DTaP/Tdap/Td (3 - Td or Tdap) 06/23/2030   Pneumonia Vaccine 19+ Years old  Completed   INFLUENZA VACCINE  Completed   DEXA SCAN  Completed   Hepatitis C Screening  Completed   Zoster Vaccines- Shingrix  Completed   HPV VACCINES  Aged Out    There are no preventive care reminders to display for this patient.  Lab Results  Component Value Date   TSH 1.42 06/29/2022   Lab Results  Component Value Date   WBC 7.7 06/29/2022   HGB 14.6 06/29/2022   HCT 43.1 06/29/2022   MCV 85.0 06/29/2022   PLT 278.0 06/29/2022   Lab  Results  Component Value Date   NA 140 06/29/2022   K 3.9 06/29/2022   CO2 24 06/29/2022   GLUCOSE 98 06/29/2022   BUN 14 06/29/2022   CREATININE 0.59 06/29/2022   BILITOT 1.2 06/29/2022   ALKPHOS 84 06/29/2022   AST 19 06/29/2022   ALT 23 06/29/2022   PROT 7.4 06/29/2022   ALBUMIN 4.6 06/29/2022   CALCIUM 9.8 06/29/2022   ANIONGAP 10 04/18/2012   GFR 94.03 06/29/2022   Lab Results  Component Value Date   CHOL 133 06/29/2022   Lab Results  Component Value Date   HDL 47.50 06/29/2022   Lab Results  Component Value Date   LDLCALC 59 06/29/2022   Lab Results  Component Value Date   TRIG 131.0 06/29/2022   Lab Results  Component Value Date   CHOLHDL 3 06/29/2022   Lab Results  Component Value Date   HGBA1C 5.8 06/29/2022      Assessment & Plan:  There are no diagnoses linked to this encounter.  Follow-up: No follow-ups on file.   Kara Dies, NP

## 2022-11-19 NOTE — Patient Instructions (Signed)
Will do labs and urinalysis. Follow up in 2 weeks

## 2022-11-26 NOTE — Assessment & Plan Note (Signed)
Will do labs for further evaluation.  Advised to suck on sugar free candies.

## 2022-11-28 ENCOUNTER — Encounter: Payer: Self-pay | Admitting: Nurse Practitioner

## 2022-12-13 ENCOUNTER — Ambulatory Visit (INDEPENDENT_AMBULATORY_CARE_PROVIDER_SITE_OTHER): Payer: PPO

## 2022-12-13 DIAGNOSIS — E538 Deficiency of other specified B group vitamins: Secondary | ICD-10-CM

## 2022-12-13 NOTE — Progress Notes (Signed)
presents today for injection per MD orders. B12 injection administered IM in Right Upper Arm. Administration without incident. Patient tolerated well.  Jaesean Litzau,cma  

## 2022-12-15 NOTE — Progress Notes (Signed)
Established Patient Office Visit  Subjective:  Patient ID: Sheri [name], female    DOB: [name] 10, 1956  Age: 67 y.o. MRN: [id number]  CC:  Chief Complaint  Patient presents with   Medical Management of Chronic Issues    Follow up dry mouth    HPI  Sheri [name] presents for follow up on dry mouth. She is doing better now. She still have some dryness of mouth but better than before.  She is using biotene mouthwash for dry mouth  3 times a day with improvement. She is also drinking fluid and and sucking on the sugar free candy.  She is stressed due to the family problems. Her husband is going to start dialysis.      HPI   Past Medical History:  Diagnosis Date   Abdominal pain, epigastric    Anxiety    Back pain    Diabetes mellitus, type 2 (HCC)    Diverticulosis of colon without hemorrhage    Duodenitis with hemorrhage    Edema    Fluid retention    GERD (gastroesophageal reflux disease)    Heart murmur    Hyperlipidemia    Hypertension    Leg edema    Mitral regurgitation    Obesity, unspecified    Osteopenia    Panic disorder without agoraphobia    Peripheral neuropathy    Personal history of malignant neoplasm of cervix uteri    CIN-3 1989, normal since, gets pap smears annually   Personal history of other genital system and obstetric disorders(V13.29)    Prediabetes    Rash and other nonspecific skin eruption    Reflux esophagitis    Swallowing difficulty    Unspecified hereditary and idiopathic peripheral neuropathy    Unspecified sleep apnea    sleep study- [facility]   Vitamin D deficiency     Past Surgical History:  Procedure Laterality Date   ABDOMINAL HYSTERECTOMY     BREAST BIOPSY Left 09/01/2022   Left Breast Stereo Bx ribbon clip , path pending   BREAST BIOPSY Left 09/01/2022   MM LT BREAST BX W LOC DEV 1ST LESION IMAGE BX SPEC STEREO GUIDE 09/01/2022 [facility]   BREAST CYST ASPIRATION     right , benign   COLONOSCOPY WITH PROPOFOL N/A  06/09/2018   Procedure: COLONOSCOPY WITH PROPOFOL;  Surgeon: Christena Deem, MD;  Location: [facility] ENDOSCOPY;  Service: Endoscopy;  Laterality: N/A;   DILATION AND CURETTAGE OF UTERUS     ESOPHAGOGASTRODUODENOSCOPY N/A 06/09/2018   Procedure: ESOPHAGOGASTRODUODENOSCOPY (EGD);  Surgeon: Christena Deem, MD;  Location: [facility] ENDOSCOPY;  Service: Endoscopy;  Laterality: N/A;   ESOPHAGOGASTRODUODENOSCOPY (EGD) WITH PROPOFOL N/A 02/09/2022   Procedure: ESOPHAGOGASTRODUODENOSCOPY (EGD) WITH PROPOFOL;  Surgeon: Wyline Mood, MD;  Location: [facility] ENDOSCOPY;  Service: Gastroenterology;  Laterality: N/A;   RIGHT OOPHORECTOMY  2001   menopause, endometriosis s/p.  HRT  since- has not tolerated previous attempts to wean    Family History  Problem Relation Age of Onset   Osteopenia Mother    Hypothyroidism Mother    Diabetes Mother    Hyperlipidemia Mother    Hypertension Mother    Obesity Mother    Diabetes Father    Alzheimer's disease Father    Coronary artery disease Father    Obesity Father    Cancer Neg Hx        breast, ovarian, colon   Breast cancer Neg Hx     Social History   Socioeconomic History  Marital status: Married    Spouse name: Billy Fenstermaker   Number of children: 0   Years of education: 14   Highest education level: Associate degree: occupational, Scientist, product/process development, or vocational program  Occupational History   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         Social Determinants of Health   Financial Resource Strain: Low Risk  (12/13/2022)   Overall  Financial Resource Strain (CARDIA)    Difficulty of Paying Living Expenses: Not very hard  Food Insecurity: No Food Insecurity (12/13/2022)   Hunger Vital Sign    Worried About Running Out of Food in the Last Year: Never true    Ran Out of Food in the Last Year: Never true  Transportation Needs: No Transportation Needs (12/13/2022)   PRAPARE - Administrator, Civil Service (Medical): No    Lack of Transportation (Non-Medical): No  Physical Activity: Unknown (12/13/2022)   Exercise Vital Sign    Days of Exercise per Week: 0 days    Minutes of Exercise per Session: Not on file  Stress: No Stress Concern Present (12/13/2022)   [employer] of Occupational Health - Occupational Stress Questionnaire    Feeling of Stress : Only a little  Social Connections: Unknown (12/13/2022)   Social Connection and Isolation Panel [NHANES]    Frequency of Communication with Friends and Family: More than three times a week    Frequency of Social Gatherings with Friends and Family: More than three times a week    Attends Religious Services: Patient declined    Database administrator or Organizations: No    Attends Engineer, structural: Not on file    Marital Status: Married  Catering manager Violence: Not At Risk (07/09/2022)   Humiliation, Afraid, Rape, and Kick questionnaire    Fear of Current or Ex-Partner: No    Emotionally Abused: No    Physically Abused: No    Sexually Abused: No     Outpatient Medications Prior to Visit  Medication Sig Dispense Refill   ALPRAZolam (XANAX) 0.5 MG tablet TAKE ONE TABLET BY MOUTH TWICE DAILY AS NEEDED FOR ANXIETY OR SLEEP 45 tablet 5   amitriptyline (ELAVIL) 25 MG tablet TAKE 3 TABLETS BY MOUTH AT BEDTIME AS DIRECTED FOR NEUROPATHY 270 tablet 1   atorvastatin (LIPITOR) 20 MG tablet Take 1 tablet by mouth daily.     atorvastatin (LIPITOR) 40 MG tablet TAKE 1 TABLET BY MOUTH DAILY 90 tablet 3   buPROPion (WELLBUTRIN XL) 150 MG 24 hr tablet  Take 1 tablet (150 mg total) by mouth daily. 90 tablet 1   Calcium Carb-Cholecalciferol 600-20 MG-MCG TABS Take 1 tablet by mouth daily.     escitalopram (LEXAPRO) 10 MG tablet TAKE 1 TABLET BY MOUTH DAILY WITH BREAKFAST. 90 tablet 1   furosemide (LASIX) 20 MG tablet TAKE 1 TABLET BY MOUTH ONCE DAILY AS NEEDED FOR FLUID RETENTION 30 tablet 3   gabapentin (NEURONTIN) 300 MG capsule TAKE 1 CAPSULE  BY MOUTH ONCE DAILY 90 capsule 1   meloxicam (MOBIC) 7.5 MG tablet TAKE ONE TABLET BY MOUTH EVERY DAY 30 tablet 5   omeprazole (PRILOSEC) 20 MG capsule Take 1 capsule (20 mg total) by mouth daily. 90 capsule 3   Semaglutide (RYBELSUS) 14 MG TABS Take 14 mg by mouth daily. 90 tablet 1   tiZANidine (ZANAFLEX) 4 MG tablet Take 1 tablet (4 mg total) by mouth every 6 (six) hours as needed for muscle spasms. 30 tablet 3   Facility-Administered Medications Prior to Visit  Medication Dose Route Frequency Provider Last Rate Last Admin   cyanocobalamin ((VITAMIN B-12)) injection 1,000 mcg  1,000 mcg Intramuscular Q30 days [name], MD   1,000 mcg at 12/13/22 1049    No Known Allergies  ROS Review of Systems  Constitutional: Negative.   HENT:  Negative for dental problem, drooling, mouth sores and sinus pressure.        Dry mouth  Eyes: Negative.   Respiratory:  Negative for chest tightness and shortness of breath.   Cardiovascular:  Negative for leg swelling.  Gastrointestinal: Negative.   Neurological:  Negative for dizziness and headaches.  Psychiatric/Behavioral: Negative.        Objective:    Physical Exam Constitutional:      Appearance: Normal appearance. She is normal weight.  HENT:     Right Ear: Tympanic membrane normal.     Left Ear: Tympanic membrane normal.     Nose: Nose normal.     Mouth/Throat:     Mouth: Mucous membranes are moist.     Pharynx: Oropharynx is clear. No oropharyngeal exudate.     Tonsils: No tonsillar exudate.  Cardiovascular:     Rate and Rhythm:  Normal rate and regular rhythm.     Pulses: Normal pulses.     Heart sounds: Normal heart sounds.  Pulmonary:     Effort: Pulmonary effort is normal.     Breath sounds: Normal breath sounds. No wheezing.  Skin:    General: Skin is warm.     Capillary Refill: Capillary refill takes less than 2 seconds.  Neurological:     General: No focal deficit present.     Mental Status: She is alert and oriented to person, place, and time. Mental status is at baseline.  Psychiatric:        Mood and Affect: Mood normal.        Behavior: Behavior normal.        Thought Content: Thought content normal.        Judgment: Judgment normal.     BP 120/74   Pulse (!) 105   Temp 97.8 F (36.6 C)   Ht 5\' 3"  (1.6 m)   Wt 212 lb (96.2 kg)   SpO2 95%   BMI 37.55 kg/m  Wt Readings from Last 3 Encounters:  12/16/22 212 lb (96.2 kg)  11/19/22 211 lb 12.8 oz (96.1 kg)  07/09/22 216 lb 3.2 oz (98.1 kg)     Health Maintenance  Topic Date Due   COVID-19 Vaccine (4 - 2023-24 season) 01/01/2023 (Originally 05/21/2022)   HEMOGLOBIN A1C  12/29/2022   FOOT EXAM  01/06/2023   Diabetic kidney evaluation - Urine ACR  06/30/2023   Medicare Annual Wellness (AWV)  07/10/2023   OPHTHALMOLOGY EXAM  07/31/2023   Diabetic kidney evaluation - eGFR measurement  11/19/2023   MAMMOGRAM  08/05/2024   COLONOSCOPY (Pts 45-71yrs Insurance coverage will need to be confirmed)  06/09/2028  DTaP/Tdap/Td (3 - Td or Tdap) 06/23/2030   Pneumonia Vaccine 61+ Years old  Completed   INFLUENZA VACCINE  Completed   DEXA SCAN  Completed   Hepatitis C Screening  Completed   Zoster Vaccines- Shingrix  Completed   HPV VACCINES  Aged Out    There are no preventive care reminders to display for this patient.  Lab Results  Component Value Date   TSH 1.54 11/19/2022   Lab Results  Component Value Date   WBC 8.3 11/19/2022   HGB 15.0 11/19/2022   HCT 43.9 11/19/2022   MCV 85.8 11/19/2022   PLT 285.0 11/19/2022   Lab Results   Component Value Date   NA 137 11/19/2022   K 3.8 11/19/2022   CO2 22 11/19/2022   GLUCOSE 93 11/19/2022   BUN 13 11/19/2022   CREATININE 0.71 11/19/2022   BILITOT 1.4 (H) 11/19/2022   ALKPHOS 86 11/19/2022   AST 18 11/19/2022   ALT 23 11/19/2022   PROT 7.0 11/19/2022   ALBUMIN 4.4 11/19/2022   CALCIUM 9.7 11/19/2022   ANIONGAP 10 04/18/2012   GFR 88.47 11/19/2022   Lab Results  Component Value Date   CHOL 133 06/29/2022   Lab Results  Component Value Date   HDL 47.50 06/29/2022   Lab Results  Component Value Date   LDLCALC 59 06/29/2022   Lab Results  Component Value Date   TRIG 131.0 06/29/2022   Lab Results  Component Value Date   CHOLHDL 3 06/29/2022   Lab Results  Component Value Date   HGBA1C 5.8 06/29/2022      Assessment & Plan:  Xerostomia Assessment & Plan: We discussed lab results. All labs WNL. Discussed with her the medication that can cause dry mouth. She do not want to change her medication (amitriptyline or Rybelsus) at present and would like to discuss with Dr. Darrick Huntsman during her upcoming appointment. Advised her to continue using Biotene oral rinse and sugar-free lozenges.      Follow-up: Return if symptoms worsen or fail to improve.   Kara Dies, NP

## 2022-12-16 ENCOUNTER — Encounter: Payer: Self-pay | Admitting: Nurse Practitioner

## 2022-12-16 ENCOUNTER — Ambulatory Visit (INDEPENDENT_AMBULATORY_CARE_PROVIDER_SITE_OTHER): Payer: PPO | Admitting: Nurse Practitioner

## 2022-12-16 VITALS — BP 120/74 | HR 105 | Temp 97.8°F | Ht 63.0 in | Wt 212.0 lb

## 2022-12-16 DIAGNOSIS — K117 Disturbances of salivary secretion: Secondary | ICD-10-CM | POA: Diagnosis not present

## 2022-12-16 NOTE — Assessment & Plan Note (Addendum)
We discussed lab results. All labs WNL. Discussed with her the medication that can cause dry mouth. She do not want to change her medication (amitriptyline or Rybelsus) at present and would like to discuss with Dr. Derrel Nip during her upcoming appointment. Advised her to continue using Biotene oral rinse and sugar-free lozenges.

## 2022-12-16 NOTE — Patient Instructions (Addendum)
Continue the mouth wash. Increase the fluid in take.  Keep follow up wit Dr. Derrel Nip next month.

## 2022-12-18 ENCOUNTER — Encounter: Payer: Self-pay | Admitting: Nurse Practitioner

## 2023-01-05 DIAGNOSIS — L700 Acne vulgaris: Secondary | ICD-10-CM | POA: Diagnosis not present

## 2023-01-05 DIAGNOSIS — L309 Dermatitis, unspecified: Secondary | ICD-10-CM | POA: Diagnosis not present

## 2023-01-05 DIAGNOSIS — L738 Other specified follicular disorders: Secondary | ICD-10-CM | POA: Diagnosis not present

## 2023-01-10 ENCOUNTER — Encounter: Payer: Self-pay | Admitting: Internal Medicine

## 2023-01-10 ENCOUNTER — Ambulatory Visit (INDEPENDENT_AMBULATORY_CARE_PROVIDER_SITE_OTHER): Payer: PPO | Admitting: Internal Medicine

## 2023-01-10 VITALS — BP 132/70 | HR 98 | Temp 98.2°F | Ht 63.0 in | Wt 216.0 lb

## 2023-01-10 DIAGNOSIS — Z9889 Other specified postprocedural states: Secondary | ICD-10-CM

## 2023-01-10 DIAGNOSIS — E1159 Type 2 diabetes mellitus with other circulatory complications: Secondary | ICD-10-CM | POA: Diagnosis not present

## 2023-01-10 DIAGNOSIS — I152 Hypertension secondary to endocrine disorders: Secondary | ICD-10-CM | POA: Diagnosis not present

## 2023-01-10 DIAGNOSIS — E669 Obesity, unspecified: Secondary | ICD-10-CM

## 2023-01-10 DIAGNOSIS — Z6839 Body mass index (BMI) 39.0-39.9, adult: Secondary | ICD-10-CM

## 2023-01-10 DIAGNOSIS — F320 Major depressive disorder, single episode, mild: Secondary | ICD-10-CM | POA: Diagnosis not present

## 2023-01-10 DIAGNOSIS — E785 Hyperlipidemia, unspecified: Secondary | ICD-10-CM | POA: Diagnosis not present

## 2023-01-10 DIAGNOSIS — E538 Deficiency of other specified B group vitamins: Secondary | ICD-10-CM | POA: Diagnosis not present

## 2023-01-10 DIAGNOSIS — R682 Dry mouth, unspecified: Secondary | ICD-10-CM

## 2023-01-10 DIAGNOSIS — E1169 Type 2 diabetes mellitus with other specified complication: Secondary | ICD-10-CM | POA: Diagnosis not present

## 2023-01-10 LAB — COMPREHENSIVE METABOLIC PANEL
ALT: 18 U/L (ref 0–35)
AST: 16 U/L (ref 0–37)
Albumin: 4.2 g/dL (ref 3.5–5.2)
Alkaline Phosphatase: 82 U/L (ref 39–117)
BUN: 12 mg/dL (ref 6–23)
CO2: 24 mEq/L (ref 19–32)
Calcium: 9 mg/dL (ref 8.4–10.5)
Chloride: 107 mEq/L (ref 96–112)
Creatinine, Ser: 0.6 mg/dL (ref 0.40–1.20)
GFR: 93.3 mL/min (ref >60.00)
Glucose, Bld: 96 mg/dL (ref 70–99)
Potassium: 3.9 mEq/L (ref 3.5–5.1)
Sodium: 139 mEq/L (ref 135–145)
Total Bilirubin: 0.7 mg/dL (ref 0.2–1.2)
Total Protein: 6.4 g/dL (ref 6.0–8.3)

## 2023-01-10 LAB — VITAMIN B12: Vitamin B-12: 463 pg/mL (ref 211–911)

## 2023-01-10 LAB — LIPID PANEL
Cholesterol: 118 mg/dL (ref 0–200)
HDL: 43.8 mg/dL (ref >39.00)
LDL Cholesterol: 52 mg/dL (ref 0–99)
NonHDL: 74.41
Total CHOL/HDL Ratio: 3
Triglycerides: 111 mg/dL (ref 0.0–149.0)
VLDL: 22.2 mg/dL (ref 0.0–40.0)

## 2023-01-10 LAB — HEMOGLOBIN A1C: Hgb A1c MFr Bld: 5.6 % (ref 4.6–6.5)

## 2023-01-10 LAB — LDL CHOLESTEROL, DIRECT: Direct LDL: 60 mg/dL

## 2023-01-10 MED ORDER — TIZANIDINE HCL 4 MG PO TABS: 4.0000 mg | ORAL_TABLET | Freq: Four times a day (QID) | ORAL | 3 refills | Status: DC | PRN

## 2023-01-10 MED ORDER — ESCITALOPRAM OXALATE 10 MG PO TABS: 10.0000 mg | ORAL_TABLET | Freq: Every day | ORAL | 1 refills | Status: DC

## 2023-01-10 MED ORDER — GABAPENTIN 300 MG PO CAPS: 300.0000 mg | ORAL_CAPSULE | Freq: Every day | ORAL | 1 refills | Status: DC

## 2023-01-10 MED ORDER — TRAZODONE HCL 50 MG PO TABS: 25.0000 mg | ORAL_TABLET | Freq: Every evening | ORAL | 3 refills | Status: DC | PRN

## 2023-01-10 MED ORDER — CYANOCOBALAMIN 1000 MCG/ML IJ SOLN
1000.0000 ug | Freq: Once | INTRAMUSCULAR | Status: AC
  Administered 2023-01-10: 1000 ug via INTRAMUSCULAR

## 2023-01-10 MED ORDER — MELOXICAM 7.5 MG PO TABS: 7.5000 mg | ORAL_TABLET | Freq: Every day | ORAL | 5 refills | Status: DC

## 2023-01-10 NOTE — Patient Instructions (Signed)
For the dry mouth:  Tylenol Pm will cause dry mouth as well as Elavil.  Continue alprazolam,   Stop the Elavil (amitriptyline) and start trazodone for insomnia. 50 mg at bedtime.  This will kick in later that night when the alprazolam wears off  Continue  Rybelsus for now  Use the dialysis time to get  30 minutes  of exercise   Mowing only counts for 1 day per week!  You need a minimum of 5 days of exercise weekly   Remember:  you have to take care of YOU because nobody else will

## 2023-01-10 NOTE — Progress Notes (Signed)
Subjective:  Patient ID: Sheri [name], female    DOB: 12-09-1955  Age: 67 y.o. MRN: [id number]  CC: The primary encounter diagnosis was Hyperlipidemia associated with type 2 diabetes mellitus. Diagnoses of Obesity, diabetes, and hypertension syndrome, Class 2 severe obesity due to excess calories with serious comorbidity and body mass index (BMI) of 39.0 to 39.9 in adult, History of benign breast biopsy, B12 deficiency, Dry mouth, unspecified, and Current mild episode of major depressive disorder without prior episode were also pertinent to this visit.   HPI Sheri [name] presents for  Chief Complaint  Patient presents with   Medical Management of Chronic Issues   1) type 2 DM/obesity:  taking rybelsus and atorvastatin:  weight is stable .  Attempts to get [facility] via [facility] have been unsuccessful  .  She has not begun an exercise program.    2) Dysgeusia and dry mouth since late February, first noticed  by her dentist.  Symptoms have been worse since she started taking elavil and rybelsus.  Has tried tylenol pm for insomnia as well.  which helps as well  3) Insomnia : husband [name] has RLS, CPAP so they sleep in separate beds, but he calls out to her several times per night so her sleep is constantly disrupted .  He is now  on hemodialysis 3/weekly  and can no longer cut the grass  so she has taken on that role but no other exercise .  4) caregiver fatigue:  she has been in a caregiver role for over 15 years, first with her mother and father , while still working full time,  followed by her husband [place]  Outpatient Medications Prior to Visit  Medication Sig Dispense Refill   ALPRAZolam (XANAX) 0.5 MG tablet TAKE ONE TABLET BY MOUTH TWICE DAILY AS NEEDED FOR ANXIETY OR SLEEP 45 tablet 5   atorvastatin (LIPITOR) 20 MG tablet Take 1 tablet by mouth daily.     atorvastatin (LIPITOR) 40 MG tablet TAKE 1 TABLET BY MOUTH DAILY 90 tablet 3   buPROPion (WELLBUTRIN XL) 150 MG 24 hr tablet Take  1 tablet (150 mg total) by mouth daily. 90 tablet 1   Calcium Carb-Cholecalciferol 600-20 MG-MCG TABS Take 1 tablet by mouth daily.     clobetasol (TEMOVATE) 0.05 % external solution Apply 1 Application topically 2 (two) times daily.     furosemide (LASIX) 20 MG tablet TAKE 1 TABLET BY MOUTH ONCE DAILY AS NEEDED FOR FLUID RETENTION 30 tablet 3   omeprazole (PRILOSEC) 20 MG capsule Take 1 capsule (20 mg total) by mouth daily. 90 capsule 3   Semaglutide (RYBELSUS) 14 MG TABS Take 14 mg by mouth daily. 90 tablet 1   amitriptyline (ELAVIL) 25 MG tablet TAKE 3 TABLETS BY MOUTH AT BEDTIME AS DIRECTED FOR NEUROPATHY 270 tablet 1   escitalopram (LEXAPRO) 10 MG tablet TAKE 1 TABLET BY MOUTH DAILY WITH BREAKFAST. 90 tablet 1   gabapentin (NEURONTIN) 300 MG capsule TAKE 1 CAPSULE BY MOUTH ONCE DAILY 90 capsule 1   meloxicam (MOBIC) 7.5 MG tablet TAKE ONE TABLET BY MOUTH EVERY DAY 30 tablet 5   tiZANidine (ZANAFLEX) 4 MG tablet Take 1 tablet (4 mg total) by mouth every 6 (six) hours as needed for muscle spasms. 30 tablet 3   Facility-Administered Medications Prior to Visit  Medication Dose Route Frequency Provider Last Rate Last Admin   cyanocobalamin ((VITAMIN B-12)) injection 1,000 mcg  1,000 mcg Intramuscular Q30 days [name], MD  1,000 mcg at 12/13/22 1049    Review of Systems;  Patient denies headache, fevers, malaise, unintentional weight loss, skin rash, eye pain, sinus congestion and sinus pain, sore throat, dysphagia,  hemoptysis , cough, dyspnea, wheezing, chest pain, palpitations, orthopnea, edema, abdominal pain, nausea, melena, diarrhea, constipation, flank pain, dysuria, hematuria, urinary  Frequency, nocturia, numbness, tingling, seizures,  Focal weakness, Loss of consciousness,  Tremor, insomnia, depression, anxiety, and suicidal ideation.      Objective:  BP 132/70   Pulse 98   Temp 98.2 F (36.8 C) (Oral)   Ht 5\' 3"  (1.6 m)   Wt 216 lb (98 kg)   SpO2 97%   BMI 38.26  kg/m   BP Readings from Last 3 Encounters:  01/10/23 132/70  12/16/22 120/74  11/19/22 126/84    Wt Readings from Last 3 Encounters:  01/10/23 216 lb (98 kg)  12/16/22 212 lb (96.2 kg)  11/19/22 211 lb 12.8 oz (96.1 kg)    Physical Exam Vitals reviewed.  Constitutional:      General: She is not in acute distress.    Appearance: Normal appearance. She is obese. She is not ill-appearing, toxic-appearing or diaphoretic.  HENT:     Head: Normocephalic.  Eyes:     General: No scleral icterus.       Right eye: No discharge.        Left eye: No discharge.     Conjunctiva/sclera: Conjunctivae normal.  Cardiovascular:     Rate and Rhythm: Normal rate and regular rhythm.     Heart sounds: Normal heart sounds.  Pulmonary:     Effort: Pulmonary effort is normal. No respiratory distress.     Breath sounds: Normal breath sounds.  Abdominal:     General: Bowel sounds are normal.     Palpations: Abdomen is soft. There is no mass.  Musculoskeletal:        General: Normal range of motion.  Skin:    General: Skin is warm and dry.  Neurological:     General: No focal deficit present.     Mental Status: She is alert and oriented to person, place, and time. Mental status is at baseline.  Psychiatric:        Mood and Affect: Mood normal.        Behavior: Behavior normal.        Thought Content: Thought content normal.        Judgment: Judgment normal.    Lab Results  Component Value Date   HGBA1C 5.6 01/10/2023   HGBA1C 5.8 06/29/2022   HGBA1C 5.6 01/01/2022    Lab Results  Component Value Date   CREATININE 0.60 01/10/2023   CREATININE 0.71 11/19/2022   CREATININE 0.59 06/29/2022    Lab Results  Component Value Date   WBC 8.3 11/19/2022   HGB 15.0 11/19/2022   HCT 43.9 11/19/2022   PLT 285.0 11/19/2022   GLUCOSE 96 01/10/2023   CHOL 118 01/10/2023   TRIG 111.0 01/10/2023   HDL 43.80 01/10/2023   LDLDIRECT 60.0 01/10/2023   LDLCALC 52 01/10/2023   ALT 18 01/10/2023    AST 16 01/10/2023   NA 139 01/10/2023   K 3.9 01/10/2023   CL 107 01/10/2023   CREATININE 0.60 01/10/2023   BUN 12 01/10/2023   CO2 24 01/10/2023   TSH 1.54 11/19/2022   HGBA1C 5.6 01/10/2023   MICROALBUR 0.9 06/29/2022    MM LT BREAST BX W LOC DEV 1ST LESION IMAGE BX SPEC STEREO GUIDE  Addendum Date: 09/02/2022   ADDENDUM REPORT: 09/02/2022 13:57 ADDENDUM: PATHOLOGY revealed: A.  BREAST, "LEFT UPPER OUTER QUADRANT"; STEREOTACTIC BIOPSY: - FIBROADENOMATOID NODULE WITH MICRO CALCIFICATIONS. - NEGATIVE FOR MALIGNANCY. Pathology results are CONCORDANT with imaging findings, per Dr. Jacob Moores. Pathology results and recommendations were discussed with patient via telephone on 09/02/2022. Patient reported biopsy site doing well with no adverse symptoms, and only slight tenderness at the site. Post biopsy care instructions were reviewed, questions were answered and my direct phone number was provided. Patient was instructed to call [facility] for any additional questions or concerns related to biopsy site. RECOMMENDATION: Patient instructed to resume annual bilateral screening mammogram, neck due in November 2024. Pathology results reported by Randa Lynn RN on 09/02/2022. Electronically Signed   By: Jacob Moores M.D.   On: 09/02/2022 13:57   Result Date: 09/02/2022 CLINICAL DATA:  67 year old female presenting for stereotactic guided biopsy of left breast calcifications EXAM: LEFT BREAST STEREOTACTIC CORE NEEDLE BIOPSY COMPARISON:  Previous exam(s). FINDINGS: The patient and I discussed the procedure of stereotactic-guided biopsy including benefits and alternatives. We discussed the high likelihood of a successful procedure. We discussed the risks of the procedure including infection, bleeding, tissue injury, clip migration, and inadequate sampling. Informed written consent was given. The usual time out protocol was performed immediately prior to the procedure. Using sterile  technique and 1% Lidocaine as local anesthetic, under stereotactic guidance, a 9 gauge vacuum assisted device was used to perform core needle biopsy of calcifications in the upper outer quadrant of the left breast using a lateral approach. Specimen radiograph was performed showing representative calcifications. Specimens with calcifications are identified for pathology. Lesion quadrant: Upper outer quadrant At the conclusion of the procedure, a ribbon shaped tissue marker clip was deployed into the biopsy cavity. Follow-up 2-view mammogram was performed and dictated separately. IMPRESSION: Stereotactic-guided biopsy of calcifications in the left breast upper-outer quadrant. No apparent complications. Electronically Signed: By: Jacob Moores M.D. On: 09/01/2022 10:32  MM CLIP PLACEMENT LEFT  Result Date: 09/01/2022 CLINICAL DATA:  Postprocedure mammogram EXAM: 3D DIAGNOSTIC LEFT MAMMOGRAM POST STEREOTACTIC BIOPSY COMPARISON:  Previous exam(s). FINDINGS: 3D Mammographic images were obtained following stereotactic guided biopsy of calcifications in the left breast upper-outer quadrant. The biopsy marking clip is in expected position at the site of biopsy. IMPRESSION: Appropriate positioning of the ribbon shaped biopsy marking clip at the site of biopsy in the left breast upper-outer quadrant. Final Assessment: Post Procedure Mammograms for Marker Placement Electronically Signed   By: Jacob Moores M.D.   On: 09/01/2022 10:35   Assessment & Plan:  .Hyperlipidemia associated with type 2 diabetes mellitus Assessment & Plan: Glycemic control is excellent with Rybelsus.  Did not tolerate Ozempic .Encouraged to prioritize time to exercise. LDL is at goal since increasing atorvastatin  to 40 mg   Lab Results  Component Value Date   HGBA1C 5.6 01/10/2023   Lab Results  Component Value Date   LABMICR 14.9 05/30/2018   MICROALBUR 0.9 06/29/2022   MICROALBUR 1.5 07/14/2021      Lab Results   Component Value Date   CHOL 118 01/10/2023   HDL 43.80 01/10/2023   LDLCALC 52 01/10/2023   LDLDIRECT 60.0 01/10/2023   TRIG 111.0 01/10/2023   CHOLHDL 3 01/10/2023         Orders: -     Lipid panel -     LDL cholesterol, direct  Obesity, diabetes, and hypertension syndrome -     Hemoglobin A1c -  Comprehensive metabolic panel  Class 2 severe obesity due to excess calories with serious comorbidity and body mass index (BMI) of 39.0 to 39.9 in adult Assessment & Plan: Weight has pateaued since October on max dose Rybelsus.  Had injection site erythema with ozempic trial . Has not een able to try  [facility]. Encouraged to start  taking better care of herself given the relative reduction in caregiver responsibilities with both parents now passed  and her husband at dialysis for four hours every other day    History of benign breast biopsy Assessment & Plan: Microcalcifications noted on imaging.  Biopsy benign . Follow up bilateral screening mammogram Dec 2024   B12 deficiency -     Vitamin B12 -     Intrinsic Factor Antibodies -     Cyanocobalamin  Dry mouth, unspecified -     Sjogrens syndrome-A extractable nuclear antibody -     Sjogrens syndrome-B extractable nuclear antibody  Current mild episode of major depressive disorder without prior episode Assessment & Plan: Mood improved with retirement, but energy level is low due to interrupted sleep.  continue lexapro and wellbutrin xl   Other orders -     Escitalopram Oxalate; Take 1 tablet (10 mg total) by mouth daily with breakfast.  Dispense: 90 tablet; Refill: 1 -     Gabapentin; Take 1 capsule (300 mg total) by mouth daily. TAKE 1 CAPSULE BY MOUTH ONCE DAILY  Dispense: 90 capsule; Refill: 1 -     Meloxicam; Take 1 tablet (7.5 mg total) by mouth daily.  Dispense: 30 tablet; Refill: 5 -     tiZANidine HCl; Take 1 tablet (4 mg total) by mouth every 6 (six) hours as needed for muscle spasms.  Dispense: 30 tablet;  Refill: 3 -     traZODone HCl; Take 0.5-1 tablets (25-50 mg total) by mouth at bedtime as needed for sleep.  Dispense: 90 tablet; Refill: 3     I provided 31 minutes of face-to-face time during this encounter reviewing patient's last visit with me, patient's  most recent visit with cardiology,  recent surgical and non surgical procedures, previous  labs and imaging studies, counseling on currently addressed issues,  and post visit ordering to diagnostics and therapeutics .   Follow-up: Return in about 6 months (around 07/12/2023) for physical.   [name], MD

## 2023-01-10 NOTE — Assessment & Plan Note (Signed)
Glycemic control is excellent with Rybelsus.  Did not tolerate Ozempic .Encouraged to prioritize time to exercise. LDL is at goal since increasing atorvastatin  to 40 mg   Lab Results  Component Value Date   HGBA1C 5.6 01/10/2023   Lab Results  Component Value Date   LABMICR 14.9 05/30/2018   MICROALBUR 0.9 06/29/2022   MICROALBUR 1.5 07/14/2021      Lab Results  Component Value Date   CHOL 118 01/10/2023   HDL 43.80 01/10/2023   LDLCALC 52 01/10/2023   LDLDIRECT 60.0 01/10/2023   TRIG 111.0 01/10/2023   CHOLHDL 3 01/10/2023

## 2023-01-10 NOTE — Assessment & Plan Note (Addendum)
Microcalcifications noted on imaging.  Biopsy benign . Follow up bilateral screening mammogram Dec 2024

## 2023-01-10 NOTE — Assessment & Plan Note (Addendum)
Weight has pateaued since October on max dose Rybelsus.  Had injection site erythema with ozempic trial . Has not een able to try  [facility]. Encouraged to start  taking better care of herself given the relative reduction in caregiver responsibilities with both parents now passed  and her husband at dialysis for four hours every other day

## 2023-01-10 NOTE — Assessment & Plan Note (Signed)
Mood improved with retirement, but energy level is low due to interrupted sleep.  continue lexapro and wellbutrin xl

## 2023-01-12 ENCOUNTER — Telehealth: Payer: Self-pay | Admitting: Internal Medicine

## 2023-01-13 NOTE — Telephone Encounter (Signed)
Pt need a refill on [facility] 

## 2023-01-13 NOTE — Telephone Encounter (Signed)
Pt is aware that medicaton has been refilled.

## 2023-01-18 LAB — INTRINSIC FACTOR ANTIBODIES: Intrinsic Factor: NEGATIVE

## 2023-01-18 LAB — SJOGRENS SYNDROME-B EXTRACTABLE NUCLEAR ANTIBODY: SSB (La) (ENA) Antibody, IgG: <1 AI

## 2023-01-18 LAB — SJOGRENS SYNDROME-A EXTRACTABLE NUCLEAR ANTIBODY: SSA (Ro) (ENA) Antibody, IgG: <1 AI

## 2023-01-28 LAB — HM DIABETES EYE EXAM

## 2023-02-07 ENCOUNTER — Ambulatory Visit: Payer: PPO

## 2023-02-17 ENCOUNTER — Telehealth: Payer: Self-pay | Admitting: Pharmacy Technician

## 2023-02-17 DIAGNOSIS — Z5986 Financial insecurity: Secondary | ICD-10-CM

## 2023-02-17 NOTE — Progress Notes (Signed)
[facility])                                            [facility] Quality Pharmacy Team    02/17/2023  Sheri [name] 1956/04/17 [id number]  Received both provider and patient portion(s) of patient assistance application(s) for Rybelsus. Faxed completed application and required documents into [employer].  Shelvy Perazzo P. Salsabeel Gorelick, CPhT [facility] [employer] [employer]  [phone]

## 2023-02-23 ENCOUNTER — Telehealth: Payer: Self-pay | Admitting: Pharmacy Technician

## 2023-02-23 DIAGNOSIS — Z5986 Financial insecurity: Secondary | ICD-10-CM

## 2023-02-23 NOTE — Progress Notes (Signed)
[facility])                                            [facility] Quality Pharmacy Team    02/23/2023  Sheri [name] 1956-01-29 [id number]  Care coordination call placed to [employer] in regard to Rybelsus application.  Spoke to Sheri [name] who informs patient is APPROVED 02/18/23-09/20/23. Medication will automatically ship to prescriber's office on file. Subsequent refills will process and ship automatically as well. However, if patient feels like supply is not adequate until next shipment, patient can call [employer] at [phone] to request refill.  Devri Kreher P. Taylor Spilde, CPhT [employer] [employer]  [phone]

## 2023-03-16 ENCOUNTER — Telehealth: Payer: Self-pay

## 2023-03-16 NOTE — Telephone Encounter (Signed)
Spoke with pt to let her know that we have received her patient assistance medication. Pt gave a verbal understanding and stated that she will pick it up tomorrow.    Rybelsus 14 mg: 4 boxes

## 2023-04-04 ENCOUNTER — Other Ambulatory Visit: Payer: Self-pay | Admitting: Internal Medicine

## 2023-04-06 DIAGNOSIS — L309 Dermatitis, unspecified: Secondary | ICD-10-CM | POA: Diagnosis not present

## 2023-04-06 DIAGNOSIS — L700 Acne vulgaris: Secondary | ICD-10-CM | POA: Diagnosis not present

## 2023-04-06 DIAGNOSIS — L578 Other skin changes due to chronic exposure to nonionizing radiation: Secondary | ICD-10-CM | POA: Diagnosis not present

## 2023-04-06 DIAGNOSIS — L821 Other seborrheic keratosis: Secondary | ICD-10-CM | POA: Diagnosis not present

## 2023-04-06 DIAGNOSIS — Z872 Personal history of diseases of the skin and subcutaneous tissue: Secondary | ICD-10-CM | POA: Diagnosis not present

## 2023-04-06 DIAGNOSIS — L738 Other specified follicular disorders: Secondary | ICD-10-CM | POA: Diagnosis not present

## 2023-05-06 NOTE — Telephone Encounter (Signed)
Error

## 2023-05-17 DIAGNOSIS — E785 Hyperlipidemia, unspecified: Secondary | ICD-10-CM | POA: Diagnosis not present

## 2023-05-17 DIAGNOSIS — F419 Anxiety disorder, unspecified: Secondary | ICD-10-CM | POA: Diagnosis not present

## 2023-05-17 DIAGNOSIS — G4700 Insomnia, unspecified: Secondary | ICD-10-CM | POA: Diagnosis not present

## 2023-05-17 DIAGNOSIS — E1169 Type 2 diabetes mellitus with other specified complication: Secondary | ICD-10-CM | POA: Diagnosis not present

## 2023-05-17 DIAGNOSIS — M62838 Other muscle spasm: Secondary | ICD-10-CM | POA: Diagnosis not present

## 2023-05-17 DIAGNOSIS — H259 Unspecified age-related cataract: Secondary | ICD-10-CM | POA: Diagnosis not present

## 2023-05-17 DIAGNOSIS — E1142 Type 2 diabetes mellitus with diabetic polyneuropathy: Secondary | ICD-10-CM | POA: Diagnosis not present

## 2023-05-17 DIAGNOSIS — M1990 Unspecified osteoarthritis, unspecified site: Secondary | ICD-10-CM | POA: Diagnosis not present

## 2023-05-17 DIAGNOSIS — K219 Gastro-esophageal reflux disease without esophagitis: Secondary | ICD-10-CM | POA: Diagnosis not present

## 2023-05-17 DIAGNOSIS — F3342 Major depressive disorder, recurrent, in full remission: Secondary | ICD-10-CM | POA: Diagnosis not present

## 2023-05-17 DIAGNOSIS — E1136 Type 2 diabetes mellitus with diabetic cataract: Secondary | ICD-10-CM | POA: Diagnosis not present

## 2023-06-27 ENCOUNTER — Other Ambulatory Visit: Payer: Self-pay | Admitting: Internal Medicine

## 2023-06-27 DIAGNOSIS — Z1231 Encounter for screening mammogram for malignant neoplasm of breast: Secondary | ICD-10-CM

## 2023-07-12 ENCOUNTER — Ambulatory Visit: Payer: PPO | Admitting: *Deleted

## 2023-07-12 VITALS — BP 130/84 | Temp 97.4°F | Ht 63.0 in | Wt 200.0 lb

## 2023-07-12 DIAGNOSIS — Z0000 Encounter for general adult medical examination without abnormal findings: Secondary | ICD-10-CM | POA: Diagnosis not present

## 2023-07-12 NOTE — Patient Instructions (Signed)
Sheri [name] , Thank you for taking time to come for your Medicare Wellness Visit. I appreciate your ongoing commitment to your health goals. Please review the following plan we discussed and let me know if I can assist you in the future.   Referrals/Orders/Follow-Ups/Clinician Recommendations: Remember to get your flu vaccine  This is a list of the screening recommended for you and due dates:  Health Maintenance  Topic Date Due   Flu Shot  04/21/2023   COVID-19 Vaccine (4 - 2023-24 season) 05/22/2023   Yearly kidney health urinalysis for diabetes  06/30/2023   Hemoglobin A1C  07/12/2023   Mammogram  08/06/2023   Yearly kidney function blood test for diabetes  01/10/2024   Complete foot exam   01/10/2024   Eye exam for diabetics  01/28/2024   Medicare Annual Wellness Visit  07/11/2024   Colon Cancer Screening  06/09/2028   DTaP/Tdap/Td vaccine (3 - Td or Tdap) 06/23/2030   Pneumonia Vaccine  Completed   DEXA scan (bone density measurement)  Completed   Hepatitis C Screening  Completed   Zoster (Shingles) Vaccine  Completed   HPV Vaccine  Aged Out    Advanced directives: (Copy Requested) Please bring a copy of your health care power of attorney and living will to the office to be added to your chart at your convenience.   Next Medicare Annual Wellness Visit scheduled for next year: Yes 07/17/24 @ 9:00

## 2023-07-12 NOTE — Progress Notes (Signed)
Subjective:   Sheri [name] is a 67 y.o. female who presents for Medicare Annual (Subsequent) preventive examination.  Visit Complete: In person Cardiac Risk Factors include: advanced age (>70men, >42 women);diabetes mellitus;dyslipidemia;hypertension     Objective:    Today's Vitals   07/12/23 0948 07/12/23 0951  BP: 130/84   Temp: (!) 97.4 F (36.3 C)   TempSrc: Skin   SpO2: 95%   Weight: 200 lb (90.7 kg)   Height: 5\' 3"  (1.6 m)   PainSc:  4    Body mass index is 35.43 kg/m.     07/12/2023   10:06 AM 07/09/2022    9:59 AM 02/09/2022    9:36 AM  Advanced Directives  Does Patient Have a Medical Advance Directive? Yes Yes No  Type of Estate agent of [place];Living will Healthcare Power of [place];Living will   Does patient want to make changes to medical advance directive?  No - Patient declined   Copy of Healthcare Power of Attorney in Chart? No - copy requested No - copy requested     Current Medications (verified) Outpatient Encounter Medications as of 07/12/2023  Medication Sig   ALPRAZolam (XANAX) 0.5 MG tablet TAKE ONE TABLET BY MOUTH TWICE DAILY AS NEEDED FOR ANXIETY OR SLEEP   atorvastatin (LIPITOR) 40 MG tablet TAKE 1 TABLET BY MOUTH DAILY   buPROPion (WELLBUTRIN XL) 150 MG 24 hr tablet TAKE 1 TABLET BY MOUTH DAILY.   Calcium Carb-Cholecalciferol 600-20 MG-MCG TABS Take 1 tablet by mouth daily.   escitalopram (LEXAPRO) 10 MG tablet Take 1 tablet (10 mg total) by mouth daily with breakfast.   furosemide (LASIX) 20 MG tablet TAKE 1 TABLET BY MOUTH ONCE DAILY AS NEEDED FOR FLUID RETENTION   gabapentin (NEURONTIN) 300 MG capsule Take 1 capsule (300 mg total) by mouth daily. TAKE 1 CAPSULE BY MOUTH ONCE DAILY   meloxicam (MOBIC) 7.5 MG tablet Take 1 tablet (7.5 mg total) by mouth daily.   omeprazole (PRILOSEC) 20 MG capsule Take 1 capsule (20 mg total) by mouth daily.   Semaglutide (RYBELSUS) 14 MG TABS Take 14 mg by mouth daily.    tiZANidine (ZANAFLEX) 4 MG tablet Take 1 tablet (4 mg total) by mouth every 6 (six) hours as needed for muscle spasms.   traZODone (DESYREL) 50 MG tablet Take 0.5-1 tablets (25-50 mg total) by mouth at bedtime as needed for sleep.   clobetasol (TEMOVATE) 0.05 % external solution Apply 1 Application topically 2 (two) times daily. (Patient not taking: Reported on 07/12/2023)   [DISCONTINUED] atorvastatin (LIPITOR) 20 MG tablet Take 1 tablet by mouth daily. (Patient not taking: Reported on 07/12/2023)   Facility-Administered Encounter Medications as of 07/12/2023  Medication   cyanocobalamin ((VITAMIN B-12)) injection 1,000 mcg    Allergies (verified) Patient has no known allergies.   History: Past Medical History:  Diagnosis Date   Abdominal pain, epigastric    Anxiety    Back pain    Diabetes mellitus, type 2 (HCC)    Diverticulosis of colon without hemorrhage    Duodenitis with hemorrhage    Edema    Fluid retention    GERD (gastroesophageal reflux disease)    Heart murmur    Hyperlipidemia    Hypertension    Leg edema    Mitral regurgitation    Obesity, unspecified    Osteopenia    Panic disorder without agoraphobia    Peripheral neuropathy    Personal history of malignant neoplasm of cervix uteri  CIN-3 1989, normal since, gets pap smears annually   Personal history of other genital system and obstetric disorders(V13.29)    Prediabetes    Rash and other nonspecific skin eruption    Reflux esophagitis    Swallowing difficulty    Unspecified hereditary and idiopathic peripheral neuropathy    Unspecified sleep apnea    sleep study- [facility]   Vitamin D deficiency    Past Surgical History:  Procedure Laterality Date   ABDOMINAL HYSTERECTOMY     BREAST BIOPSY Left 09/01/2022   Left Breast Stereo Bx ribbon clip , path pending   BREAST BIOPSY Left 09/01/2022   MM LT BREAST BX W LOC DEV 1ST LESION IMAGE BX SPEC STEREO GUIDE 09/01/2022 [facility]   BREAST CYST  ASPIRATION     right , benign   COLONOSCOPY WITH PROPOFOL N/A 06/09/2018   Procedure: COLONOSCOPY WITH PROPOFOL;  Surgeon: Christena Deem, MD;  Location: [facility] ENDOSCOPY;  Service: Endoscopy;  Laterality: N/A;   DILATION AND CURETTAGE OF UTERUS     ESOPHAGOGASTRODUODENOSCOPY N/A 06/09/2018   Procedure: ESOPHAGOGASTRODUODENOSCOPY (EGD);  Surgeon: Christena Deem, MD;  Location: [facility] ENDOSCOPY;  Service: Endoscopy;  Laterality: N/A;   ESOPHAGOGASTRODUODENOSCOPY (EGD) WITH PROPOFOL N/A 02/09/2022   Procedure: ESOPHAGOGASTRODUODENOSCOPY (EGD) WITH PROPOFOL;  Surgeon: Wyline Mood, MD;  Location: [facility] ENDOSCOPY;  Service: Gastroenterology;  Laterality: N/A;   RIGHT OOPHORECTOMY  2001   menopause, endometriosis s/p.  HRT  since- has not tolerated previous attempts to wean   Family History  Problem Relation Age of Onset   Osteopenia Mother    Hypothyroidism Mother    Diabetes Mother    Hyperlipidemia Mother    Hypertension Mother    Obesity Mother    Diabetes Father    Alzheimer's disease Father    Coronary artery disease Father    Obesity Father    Cancer Neg Hx        breast, ovarian, colon   Breast cancer Neg Hx    Social History   Socioeconomic History   Marital status: Married    Spouse name: Billy Vossler   Number of children: 0   Years of education: 14   Highest education level: Associate degree: occupational, Scientist, product/process development, or vocational program  Occupational History   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         Social Determinants of Health    Financial Resource Strain: Low Risk  (07/12/2023)   Overall Financial Resource Strain (CARDIA)    Difficulty of Paying Living Expenses: Not hard at all  Food Insecurity: No Food Insecurity (07/12/2023)   Hunger Vital Sign    Worried About Running Out of Food in the Last Year: Never true    Ran Out of Food in the Last Year: Never true  Transportation Needs: No Transportation Needs (07/12/2023)   PRAPARE - Administrator, Civil Service (Medical): No    Lack of Transportation (Non-Medical): No  Physical Activity: Insufficiently Active (07/12/2023)   Exercise Vital  Sign    Days of Exercise per Week: 7 days    Minutes of Exercise per Session: 20 min  Stress: No Stress Concern Present (07/12/2023)   [employer] of Occupational Health - Occupational Stress Questionnaire    Feeling of Stress : Not at all  Social Connections: Moderately Isolated (07/12/2023)   Social Connection and Isolation Panel [NHANES]    Frequency of Communication with Friends and Family: More than three times a week    Frequency of Social Gatherings with Friends and Family: Three times a week    Attends Religious Services: Never    Active Member of Clubs or Organizations: No    Attends Engineer, structural: Never    Marital Status: Married    Tobacco Counseling Counseling given: Not Answered   Clinical Intake:  Pre-visit preparation completed: Yes  Pain : 0-10 Pain Score: 4  Pain Type: Chronic pain Pain Location: Back Pain Orientation: Lower Pain Descriptors / Indicators: Aching Pain Onset: More than a month ago Pain Frequency: Intermittent     BMI - recorded: 35.43 Nutritional Status: BMI > 30  Obese Nutritional Risks: None Diabetes: Yes CBG done?: No Did pt. bring in CBG monitor from home?: No  How often do you need to have someone help you when you read instructions, pamphlets, or other written materials from your doctor or pharmacy?: 1 - Never  Interpreter  Needed?: No  Information entered by :: R. Ziah Leandro LPN   Activities of Daily Living    07/12/2023    9:53 AM  In your present state of health, do you have any difficulty performing the following activities:  Hearing? 0  Vision? 0  Comment contacts and glasses  Difficulty concentrating or making decisions? 0  Walking or climbing stairs? 0  Dressing or bathing? 0  Doing errands, shopping? 0  Preparing Food and eating ? N  Using the Toilet? N  In the past six months, have you accidently leaked urine? N  Do you have problems with loss of bowel control? N  Managing your Medications? N  Managing your Finances? N  Housekeeping or managing your Housekeeping? N    Patient Care Team: [name], MD as PCP - General (Internal Medicine)  Indicate any recent Medical Services you may have received from other than [facility] providers in the past year (date may be approximate).     Assessment:   This is a routine wellness examination for Memorie.  Hearing/Vision screen Hearing Screening - Comments:: No issues Vision Screening - Comments:: Contacts and glasses   Goals Addressed             This Visit's Progress    Patient Stated       Wants to continue to loss weight       Depression Screen    07/12/2023   10:01 AM 01/10/2023    9:21 AM 12/16/2022    8:40 AM 11/19/2022    9:02 AM 07/09/2022    9:42 AM 06/29/2022    4:47 PM 06/29/2022    1:03 PM  PHQ 2/9 Scores  PHQ - 2 Score 0 0 0 0 0 0 0  PHQ- 9 Score 1 1 0 0 0 1     Fall Risk    07/12/2023    9:56 AM 01/10/2023    9:20 AM 12/16/2022    8:40 AM 11/19/2022    9:02 AM 07/09/2022   10:15 AM  Fall Risk   Falls in the past year? 1  0 0 0 0  Number falls in past yr: 0 0 0 0 0  Injury with Fall? 1 0 0 0 0  Risk for fall due to : History of fall(s);Impaired balance/gait No Fall Risks No Fall Risks No Fall Risks No Fall Risks  Risk for fall due to: Comment riding bike      Follow up Falls evaluation completed;Falls prevention  discussed Falls evaluation completed Falls evaluation completed Falls evaluation completed Falls evaluation completed    MEDICARE RISK AT HOME: Medicare Risk at Home Any stairs in or around the home?: Yes If so, are there any without handrails?: No Home free of loose throw rugs in walkways, pet beds, electrical cords, etc?: Yes Adequate lighting in your home to reduce risk of falls?: Yes Life alert?: No Use of a cane, walker or w/c?: No Grab bars in the bathroom?: Yes Shower chair or bench in shower?: Yes Elevated toilet seat or a handicapped toilet?: Yes  TIMED UP AND GO:  Was the test performed?  Yes  Length of time to ambulate 10 feet: 8 sec Gait steady and fast without use of assistive device    Cognitive Function:        07/12/2023   10:07 AM 07/09/2022   10:03 AM 06/29/2021   10:45 AM  6CIT Screen  What Year? 0 points 0 points 0 points  What month? 0 points 0 points 0 points  What time? 0 points 0 points 0 points  Count back from 20 0 points 0 points 0 points  Months in reverse 0 points 0 points 0 points  Repeat phrase 0 points 0 points 0 points  Total Score 0 points 0 points 0 points    Immunizations Immunization History  Administered Date(s) Administered   Fluad Quad(high Dose 65+) 06/29/2021, 06/29/2022   Influenza Split 05/29/2013, 06/03/2014   Influenza-Unspecified 06/27/2015, 06/25/2016, 06/25/2017, 06/25/2018, 06/21/2019   PFIZER(Purple Top)SARS-COV-2 Vaccination 09/11/2019, 10/02/2019, 07/15/2020   PNEUMOCOCCAL CONJUGATE-20 06/29/2021   Pneumococcal Polysaccharide-23 09/23/2014   Td 06/23/2020   Tdap 05/29/2010   Zoster Recombinant(Shingrix) 03/27/2021, 10/08/2021    TDAP status: Up to date  Flu Vaccine status: Due, Education has been provided regarding the importance of this vaccine. Advised may receive this vaccine at local pharmacy or Health Dept. Aware to provide a copy of the vaccination record if obtained from local pharmacy or Health Dept.  Verbalized acceptance and understanding. Patient wants to get at appointment next week. Started with sniffles today.  Pneumococcal vaccine status: Up to date  Covid-19 vaccine status: Information provided on how to obtain vaccines.   Qualifies for Shingles Vaccine? Yes   Zostavax completed No   Shingrix Completed?: Yes  Screening Tests Health Maintenance  Topic Date Due   INFLUENZA VACCINE  04/21/2023   COVID-19 Vaccine (4 - 2023-24 season) 05/22/2023   Diabetic kidney evaluation - Urine ACR  06/30/2023   Medicare Annual Wellness (AWV)  07/10/2023   HEMOGLOBIN A1C  07/12/2023   MAMMOGRAM  08/06/2023   Diabetic kidney evaluation - eGFR measurement  01/10/2024   FOOT EXAM  01/10/2024   OPHTHALMOLOGY EXAM  01/28/2024   Colonoscopy  06/09/2028   DTaP/Tdap/Td (3 - Td or Tdap) 06/23/2030   Pneumonia Vaccine 70+ Years old  Completed   DEXA SCAN  Completed   Hepatitis C Screening  Completed   Zoster Vaccines- Shingrix  Completed   HPV VACCINES  Aged Out    Health Maintenance  Health Maintenance Due  Topic Date Due   INFLUENZA  VACCINE  04/21/2023   COVID-19 Vaccine (4 - 2023-24 season) 05/22/2023   Diabetic kidney evaluation - Urine ACR  06/30/2023   Medicare Annual Wellness (AWV)  07/10/2023   HEMOGLOBIN A1C  07/12/2023    Colorectal cancer screening: Type of screening: Colonoscopy. Completed 05/2018. Repeat every 10 years  Mammogram status: Completed 07/2022. Repeat every year Scheduled 08/08/2023  Bone Density status: Completed 07/2021. Results reflect: Bone density results: OSTEOPENIA. Repeat every 2 years. Wants to discuss with PCP next week at appointment  Lung Cancer Screening: (Low Dose CT Chest recommended if Age 30-80 years, 20 pack-year currently smoking OR have quit w/in 15years.) does not qualify.     Additional Screening:  Hepatitis C Screening: does qualify; Completed 07/2016  Vision Screening: Recommended annual ophthalmology exams for early detection of  glaucoma and other disorders of the eye. Is the patient up to date with their annual eye exam?  Yes  Who is the provider or what is the name of the office in which the patient attends annual eye exams? [facility] If pt is not established with a provider, would they like to be referred to a provider to establish care? No .   Dental Screening: Recommended annual dental exams for proper oral hygiene  Diabetic Foot Exam: Diabetic Foot Exam: Completed 12/2022  Community Resource Referral / Chronic Care Management: CRR required this visit?  No   CCM required this visit?  No     Plan:     I have personally reviewed and noted the following in the patient's chart:   Medical and social history Use of alcohol, tobacco or illicit drugs  Current medications and supplements including opioid prescriptions. Patient is not currently taking opioid prescriptions. Functional ability and status Nutritional status Physical activity Advanced directives List of other physicians Hospitalizations, surgeries, and ER visits in previous 12 months Vitals Screenings to include cognitive, depression, and falls Referrals and appointments  In addition, I have reviewed and discussed with patient certain preventive protocols, quality metrics, and best practice recommendations. A written personalized care plan for preventive services as well as general preventive health recommendations were provided to patient.     Sydell Axon, LPN   [id number]/[id number]   After Visit Summary: (MyChart) Due to this being a telephonic visit, the after visit summary with patients personalized plan was offered to patient via MyChart  Patient requested it be on mychart.  Nurse Notes: None

## 2023-07-13 ENCOUNTER — Telehealth: Payer: Self-pay

## 2023-07-13 NOTE — Telephone Encounter (Signed)
Spoke with pt to inform her that we have received her pt assistance medication and it is ready for pick up. Pt stated that she would be by the office tomorrow to pick up.   Rybelsus 14 mg: 4 boxes

## 2023-07-18 ENCOUNTER — Encounter: Payer: Self-pay | Admitting: Internal Medicine

## 2023-07-18 ENCOUNTER — Ambulatory Visit (INDEPENDENT_AMBULATORY_CARE_PROVIDER_SITE_OTHER): Payer: PPO | Admitting: Internal Medicine

## 2023-07-18 VITALS — BP 102/70 | HR 84 | Ht 63.0 in | Wt 202.1 lb

## 2023-07-18 DIAGNOSIS — I152 Hypertension secondary to endocrine disorders: Secondary | ICD-10-CM | POA: Diagnosis not present

## 2023-07-18 DIAGNOSIS — Z0000 Encounter for general adult medical examination without abnormal findings: Secondary | ICD-10-CM | POA: Diagnosis not present

## 2023-07-18 DIAGNOSIS — R5383 Other fatigue: Secondary | ICD-10-CM

## 2023-07-18 DIAGNOSIS — E1159 Type 2 diabetes mellitus with other circulatory complications: Secondary | ICD-10-CM | POA: Diagnosis not present

## 2023-07-18 DIAGNOSIS — E1169 Type 2 diabetes mellitus with other specified complication: Secondary | ICD-10-CM | POA: Diagnosis not present

## 2023-07-18 DIAGNOSIS — S8991XA Unspecified injury of right lower leg, initial encounter: Secondary | ICD-10-CM | POA: Diagnosis not present

## 2023-07-18 DIAGNOSIS — E785 Hyperlipidemia, unspecified: Secondary | ICD-10-CM | POA: Diagnosis not present

## 2023-07-18 DIAGNOSIS — Z23 Encounter for immunization: Secondary | ICD-10-CM

## 2023-07-18 DIAGNOSIS — E669 Obesity, unspecified: Secondary | ICD-10-CM | POA: Diagnosis not present

## 2023-07-18 DIAGNOSIS — F320 Major depressive disorder, single episode, mild: Secondary | ICD-10-CM | POA: Diagnosis not present

## 2023-07-18 DIAGNOSIS — E1121 Type 2 diabetes mellitus with diabetic nephropathy: Secondary | ICD-10-CM | POA: Diagnosis not present

## 2023-07-18 DIAGNOSIS — Z7984 Long term (current) use of oral hypoglycemic drugs: Secondary | ICD-10-CM | POA: Diagnosis not present

## 2023-07-18 LAB — HEMOGLOBIN A1C: Hgb A1c MFr Bld: 5.5 % (ref 4.6–6.5)

## 2023-07-18 LAB — COMPREHENSIVE METABOLIC PANEL
ALT: 20 U/L (ref 0–35)
AST: 18 U/L (ref 0–37)
Albumin: 4.3 g/dL (ref 3.5–5.2)
Alkaline Phosphatase: 72 U/L (ref 39–117)
BUN: 21 mg/dL (ref 6–23)
CO2: 23 meq/L (ref 19–32)
Calcium: 9.3 mg/dL (ref 8.4–10.5)
Chloride: 108 meq/L (ref 96–112)
Creatinine, Ser: 0.63 mg/dL (ref 0.40–1.20)
GFR: 91.87 mL/min (ref >60.00)
Glucose, Bld: 110 mg/dL — ABNORMAL HIGH (ref 70–99)
Potassium: 4.1 meq/L (ref 3.5–5.1)
Sodium: 140 meq/L (ref 135–145)
Total Bilirubin: 0.9 mg/dL (ref 0.2–1.2)
Total Protein: 6.8 g/dL (ref 6.0–8.3)

## 2023-07-18 LAB — MICROALBUMIN / CREATININE URINE RATIO
Creatinine,U: 413.9 mg/dL
Microalb Creat Ratio: 0.5 mg/g (ref 0.0–30.0)
Microalb, Ur: 2.2 mg/dL — ABNORMAL HIGH (ref 0.0–1.9)

## 2023-07-18 LAB — CBC WITH DIFFERENTIAL/PLATELET
Basophils Absolute: 0 10*3/uL (ref 0.0–0.1)
Basophils Relative: 0.7 % (ref 0.0–3.0)
Eosinophils Absolute: 0.1 10*3/uL (ref 0.0–0.7)
Eosinophils Relative: 2.2 % (ref 0.0–5.0)
HCT: 42.2 % (ref 36.0–46.0)
Hemoglobin: 13.7 g/dL (ref 12.0–15.0)
Lymphocytes Relative: 28.1 % (ref 12.0–46.0)
Lymphs Abs: 1.9 10*3/uL (ref 0.7–4.0)
MCHC: 32.6 g/dL (ref 30.0–36.0)
MCV: 89.4 fL (ref 78.0–100.0)
Monocytes Absolute: 0.6 10*3/uL (ref 0.1–1.0)
Monocytes Relative: 8.6 % (ref 3.0–12.0)
Neutro Abs: 4 10*3/uL (ref 1.4–7.7)
Neutrophils Relative %: 60.4 % (ref 43.0–77.0)
Platelets: 316 10*3/uL (ref 150.0–400.0)
RBC: 4.72 Mil/uL (ref 3.87–5.11)
RDW: 13.2 % (ref 11.5–15.5)
WBC: 6.7 10*3/uL (ref 4.0–10.5)

## 2023-07-18 LAB — LIPID PANEL
Cholesterol: 144 mg/dL (ref 0–200)
HDL: 61.2 mg/dL (ref >39.00)
LDL Cholesterol: 68 mg/dL (ref 0–99)
NonHDL: 82.96
Total CHOL/HDL Ratio: 2
Triglycerides: 74 mg/dL (ref 0.0–149.0)
VLDL: 14.8 mg/dL (ref 0.0–40.0)

## 2023-07-18 LAB — TSH: TSH: 2.39 u[IU]/mL (ref 0.35–5.50)

## 2023-07-18 LAB — LDL CHOLESTEROL, DIRECT: Direct LDL: 70 mg/dL

## 2023-07-18 MED ORDER — BUPROPION HCL ER (XL) 300 MG PO TB24: 300.0000 mg | ORAL_TABLET | Freq: Every day | ORAL | 2 refills | Status: DC

## 2023-07-18 MED ORDER — GABAPENTIN 300 MG PO CAPS: 300.0000 mg | ORAL_CAPSULE | Freq: Every day | ORAL | 1 refills | Status: DC

## 2023-07-18 MED ORDER — ESCITALOPRAM OXALATE 10 MG PO TABS: 10.0000 mg | ORAL_TABLET | Freq: Every day | ORAL | 1 refills | Status: DC

## 2023-07-18 MED ORDER — FUROSEMIDE 20 MG PO TABS: ORAL_TABLET | ORAL | 3 refills | Status: AC

## 2023-07-18 MED ORDER — OMEPRAZOLE 20 MG PO CPDR: 20.0000 mg | DELAYED_RELEASE_CAPSULE | Freq: Every day | ORAL | 3 refills | Status: DC

## 2023-07-18 MED ORDER — RYBELSUS 14 MG PO TABS: 14.0000 mg | ORAL_TABLET | Freq: Every day | ORAL | 5 refills | Status: DC
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
## 2023-07-18 NOTE — Assessment & Plan Note (Signed)

## 2023-07-18 NOTE — Assessment & Plan Note (Signed)
= ,   Glycemic control HAS BEEN  Excellent  BUT HER weight loss has stopped and she has gained 6 lbs since May despite taking  Rybelsus. Not exercising.    Did not tolerate Ozempic due to injection site pain/redness.  Willing to try [facility] if [facility] pharmacy assistance can get it for her. Encouraged to prioritize time to EXERCISE  .   Lab Results  Component Value Date   HGBA1C 5.5 07/18/2023   Lab Results  Component Value Date   LABMICR 14.9 05/30/2018   MICROALBUR 2.2 (H) 07/18/2023   MICROALBUR 0.9 06/29/2022      Lab Results  Component Value Date   CHOL 144 07/18/2023   HDL 61.20 07/18/2023   LDLCALC 68 07/18/2023   LDLDIRECT 70.0 07/18/2023   TRIG 74.0 07/18/2023   CHOLHDL 2 07/18/2023
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
## 2023-07-18 NOTE — Patient Instructions (Signed)
OK TO INCREASE TUMERIC TO TWICE DAILY  INCREASING WELLBUTRIN TO 300 MG DAILY IN THE MORNING TO IMPROVE YOUR ENERGY LEVEL.  IF YOU DO NOT TOLERATE THE INCREASED DOSE,  LET ME KNOW   DO NOT ALLOW Sheri [name] TO GUILT YOU INTO LIFTING HIM FROM THE FLOOR!  YOU ONLY HAVE ONE BACK AND IF YOU HERNIATE  DISK YOU WILL END UP HAVING TO PLACE HIM IN A NURSING HOME!

## 2023-07-18 NOTE — Assessment & Plan Note (Signed)
Glycemic control is excellent with Rybelsus.  Did not tolerate Ozempic .Encouraged to resume daily home exercise to maintain aerobic benefits.   LDL is s at goal since increasing atorvastatin  to 40 mg   Lab Results  Component Value Date   HGBA1C 5.5 07/18/2023   Lab Results  Component Value Date   LABMICR 14.9 05/30/2018   MICROALBUR 2.2 (H) 07/18/2023   MICROALBUR 0.9 06/29/2022      Lab Results  Component Value Date   CHOL 144 07/18/2023   HDL 61.20 07/18/2023   LDLCALC 68 07/18/2023   LDLDIRECT 70.0 07/18/2023   TRIG 74.0 07/18/2023   CHOLHDL 2 07/18/2023

## 2023-07-18 NOTE — Assessment & Plan Note (Signed)
Mood improved with retirement, but caregiver fatigue is sapping her energy level .  Encouraged to rsume daily exercise at home  and to  continue lexapro and wellbutrin xl

## 2023-07-19 DIAGNOSIS — E1121 Type 2 diabetes mellitus with diabetic nephropathy: Secondary | ICD-10-CM | POA: Insufficient documentation

## 2023-07-19 MED ORDER — LOSARTAN POTASSIUM 25 MG PO TABS: 25.0000 mg | ORAL_TABLET | Freq: Every day | ORAL | 5 refills | Status: DC

## 2023-07-19 NOTE — Addendum Note (Signed)
Addended by: [name] on: 07/19/2023 10:09 PM   Modules accepted: Orders

## 2023-07-20 ENCOUNTER — Other Ambulatory Visit (HOSPITAL_COMMUNITY): Payer: Self-pay

## 2023-07-20 ENCOUNTER — Telehealth: Payer: Self-pay

## 2023-07-20 NOTE — Telephone Encounter (Signed)
Pharmacy Patient Advocate Encounter   Received notification from Physician's Office that prior authorization for Rybelsus is required/requested.   Insurance verification completed.   The patient is insured through [facility] ADVANTAGE/RX ADVANCE .   Per test claim: PA required; PA submitted to above mentioned insurance via CoverMyMeds Key/confirmation #/EOC (Key: [id number] Status is pending

## 2023-07-21 ENCOUNTER — Other Ambulatory Visit (HOSPITAL_COMMUNITY): Payer: Self-pay

## 2023-07-21 NOTE — Telephone Encounter (Signed)
Pharmacy Patient Advocate Encounter  Received notification from [facility] ADVANTAGE/RX ADVANCE that Prior Authorization for Rybelsus 14MG  tablets has been APPROVED from 07/20/2023 to 07/19/2024. Ran test claim, Copay is $47.00 for a one month supply. This test claim was processed through [facility]- copay amounts may vary at other pharmacies due to pharmacy/plan contracts, or as the patient moves through the different stages of their insurance plan.   PA #/Case ID/Reference #:  [id number]

## 2023-07-22 NOTE — Telephone Encounter (Signed)
Routed PA approval note to [facility]

## 2023-07-25 ENCOUNTER — Telehealth: Payer: Self-pay | Admitting: Internal Medicine

## 2023-07-25 DIAGNOSIS — E1121 Type 2 diabetes mellitus with diabetic nephropathy: Secondary | ICD-10-CM

## 2023-07-25 NOTE — Telephone Encounter (Signed)
Patient need lab orders.

## 2023-07-28 ENCOUNTER — Other Ambulatory Visit (INDEPENDENT_AMBULATORY_CARE_PROVIDER_SITE_OTHER): Payer: PPO

## 2023-07-28 DIAGNOSIS — E1121 Type 2 diabetes mellitus with diabetic nephropathy: Secondary | ICD-10-CM

## 2023-07-28 LAB — BASIC METABOLIC PANEL
BUN: 19 mg/dL (ref 6–23)
CO2: 22 meq/L (ref 19–32)
Calcium: 9 mg/dL (ref 8.4–10.5)
Chloride: 109 meq/L (ref 96–112)
Creatinine, Ser: 0.67 mg/dL (ref 0.40–1.20)
GFR: 90.5 mL/min (ref >60.00)
Glucose, Bld: 106 mg/dL — ABNORMAL HIGH (ref 70–99)
Potassium: 3.8 meq/L (ref 3.5–5.1)
Sodium: 139 meq/L (ref 135–145)

## 2023-08-08 ENCOUNTER — Ambulatory Visit
Admission: RE | Admit: 2023-08-08 | Discharge: 2023-08-08 | Disposition: A | Discharge status: Home / Self Care | Payer: PPO | Source: Ambulatory Visit | Attending: Internal Medicine | Admitting: Internal Medicine

## 2023-08-08 DIAGNOSIS — Z1231 Encounter for screening mammogram for malignant neoplasm of breast: Secondary | ICD-10-CM | POA: Diagnosis not present

## 2023-08-23 ENCOUNTER — Telehealth: Payer: Self-pay

## 2023-08-23 NOTE — Telephone Encounter (Signed)
Mychart message regarding rybelsus 14mg  2025 re enrollment has not been read

## 2023-08-23 NOTE — Telephone Encounter (Signed)
Apply on line and fax doctors portion

## 2023-08-24 ENCOUNTER — Other Ambulatory Visit: Payer: Self-pay | Admitting: Internal Medicine

## 2023-09-02 NOTE — Telephone Encounter (Signed)
[employer] appl with insurance card.

## 2023-09-16 NOTE — Telephone Encounter (Signed)
PAP: Patient assistance application for Rybelsus has been approved by PAP Companies: [employer] from 09/21/2023 to 02/13/2024. Medication should be delivered to PAP Delivery: Provider's office For further shipping updates, please contact [employer] at [phone] Pt ID is: Not provided

## 2023-10-03 ENCOUNTER — Telehealth: Payer: Self-pay | Admitting: Internal Medicine

## 2023-10-03 DIAGNOSIS — E1169 Type 2 diabetes mellitus with other specified complication: Secondary | ICD-10-CM

## 2023-10-03 NOTE — Telephone Encounter (Signed)
 Patient need lab orders.

## 2023-10-03 NOTE — Addendum Note (Signed)
 Addended by: [name] on: 10/03/2023 12:40 PM   Modules accepted: Orders

## 2023-10-13 ENCOUNTER — Other Ambulatory Visit: Payer: Self-pay | Admitting: Internal Medicine

## 2023-10-13 ENCOUNTER — Telehealth: Payer: Self-pay | Admitting: *Deleted

## 2023-10-13 ENCOUNTER — Other Ambulatory Visit (INDEPENDENT_AMBULATORY_CARE_PROVIDER_SITE_OTHER): Payer: PPO

## 2023-10-13 ENCOUNTER — Encounter: Payer: Self-pay | Admitting: *Deleted

## 2023-10-13 DIAGNOSIS — E785 Hyperlipidemia, unspecified: Secondary | ICD-10-CM | POA: Diagnosis not present

## 2023-10-13 DIAGNOSIS — E1169 Type 2 diabetes mellitus with other specified complication: Secondary | ICD-10-CM | POA: Diagnosis not present

## 2023-10-13 LAB — COMPREHENSIVE METABOLIC PANEL
ALT: 16 U/L (ref 0–35)
AST: 16 U/L (ref 0–37)
Albumin: 4.3 g/dL (ref 3.5–5.2)
Alkaline Phosphatase: 63 U/L (ref 39–117)
BUN: 14 mg/dL (ref 6–23)
CO2: 24 meq/L (ref 19–32)
Calcium: 9.4 mg/dL (ref 8.4–10.5)
Chloride: 109 meq/L (ref 96–112)
Creatinine, Ser: 0.84 mg/dL (ref 0.40–1.20)
GFR: 71.85 mL/min (ref >60.00)
Glucose, Bld: 120 mg/dL — ABNORMAL HIGH (ref 70–99)
Potassium: 4.1 meq/L (ref 3.5–5.1)
Sodium: 142 meq/L (ref 135–145)
Total Bilirubin: 0.8 mg/dL (ref 0.2–1.2)
Total Protein: 6.6 g/dL (ref 6.0–8.3)

## 2023-10-13 LAB — HEMOGLOBIN A1C: Hgb A1c MFr Bld: 5.5 % (ref 4.6–6.5)

## 2023-10-13 NOTE — Telephone Encounter (Signed)
Pt came in for labs this morning & asked [name] to let Dr Darrick Huntsman know that her husband Sheri [name]) passed away on 10-26-2023.  I have updated this in her chart as well.

## 2023-10-14 LAB — LIPID PANEL W/REFLEX DIRECT LDL
Cholesterol: 119 mg/dL (ref <200)
HDL: 58 mg/dL (ref >=50)
LDL Cholesterol (Calc): 45 mg/dL
Non-HDL Cholesterol (Calc): 61 mg/dL (ref <130)
Total CHOL/HDL Ratio: 2.1 (calc) (ref <5.0)
Triglycerides: 78 mg/dL (ref <150)

## 2023-10-15 ENCOUNTER — Encounter: Payer: Self-pay | Admitting: Internal Medicine

## 2023-10-18 ENCOUNTER — Encounter: Payer: Self-pay | Admitting: Internal Medicine

## 2023-10-18 ENCOUNTER — Ambulatory Visit (INDEPENDENT_AMBULATORY_CARE_PROVIDER_SITE_OTHER): Payer: PPO | Admitting: Internal Medicine

## 2023-10-18 VITALS — BP 122/80 | HR 92 | Ht 63.0 in | Wt 181.0 lb

## 2023-10-18 DIAGNOSIS — E1159 Type 2 diabetes mellitus with other circulatory complications: Secondary | ICD-10-CM | POA: Diagnosis not present

## 2023-10-18 DIAGNOSIS — E669 Obesity, unspecified: Secondary | ICD-10-CM

## 2023-10-18 DIAGNOSIS — E1121 Type 2 diabetes mellitus with diabetic nephropathy: Secondary | ICD-10-CM | POA: Diagnosis not present

## 2023-10-18 DIAGNOSIS — E1169 Type 2 diabetes mellitus with other specified complication: Secondary | ICD-10-CM

## 2023-10-18 DIAGNOSIS — F4321 Adjustment disorder with depressed mood: Secondary | ICD-10-CM

## 2023-10-18 DIAGNOSIS — E785 Hyperlipidemia, unspecified: Secondary | ICD-10-CM

## 2023-10-18 DIAGNOSIS — Z7984 Long term (current) use of oral hypoglycemic drugs: Secondary | ICD-10-CM | POA: Diagnosis not present

## 2023-10-18 DIAGNOSIS — I152 Hypertension secondary to endocrine disorders: Secondary | ICD-10-CM

## 2023-10-18 MED ORDER — ATORVASTATIN CALCIUM 40 MG PO TABS: 40.0000 mg | ORAL_TABLET | Freq: Every day | ORAL | 3 refills | Status: DC

## 2023-10-18 MED ORDER — MELOXICAM 7.5 MG PO TABS: 7.5000 mg | ORAL_TABLET | Freq: Every day | ORAL | 5 refills | Status: DC

## 2023-10-18 NOTE — Assessment & Plan Note (Signed)
Patient is dealing with the loss of spouse [name] and has adequate coping skills and emotional support .

## 2023-10-18 NOTE — Assessment & Plan Note (Signed)
Glycemic control HAS BEEN  Excellent  and she has slost 21 lbs since [place]  and taking  Rybelsus. Not exercising.    Did not tolerate Ozempic due to injection site pain/redness.   Lab Results  Component Value Date   LABMICR 14.9 05/30/2018   MICROALBUR 2.2 (H) 07/18/2023   MICROALBUR 0.9 06/29/2022      Lab Results  Component Value Date   CHOL 119 10/13/2023   HDL 58 10/13/2023   LDLCALC 45 10/13/2023   LDLDIRECT 70.0 07/18/2023   TRIG 78 10/13/2023   CHOLHDL 2.1 10/13/2023

## 2023-10-18 NOTE — Assessment & Plan Note (Signed)
Managed with losartan 25 mg daily  Lab Results  Component Value Date   LABMICR 14.9 05/30/2018   MICROALBUR 2.2 (H) 07/18/2023   MICROALBUR 0.9 06/29/2022

## 2023-10-18 NOTE — Progress Notes (Signed)
Subjective:  Patient ID: Sheri [name], female    DOB: Sep 11, 1956  Age: 68 y.o. MRN: [id number]  CC: The primary encounter diagnosis was Obesity, diabetes, and hypertension syndrome (HCC). Diagnoses of Hyperlipidemia associated with type 2 diabetes mellitus (HCC), Microalbuminuric diabetic nephropathy (HCC), and Grief were also pertinent to this visit.   HPI Sheri [name] presents for  Chief Complaint  Patient presents with   Medical Management of Chronic Issues    3 month follow up on diabetes   Grief:  Patient is grieving the loss of husband Sheri [name]  who died after suffering several strokes in the last 90 days after attempting to undergo TARV for severe aortic stenosis with systolic dysfunction.  She is unaccustomed to sleeping alone and has been having some anxiety.  She is planning a 2 week vacation to the beach with a close friend and looking forward to the change in scenery.  Feels a bit lost  and unfamiliar with herself as she has been  a dedicated wife and caregiver to husband, parents and other relatives for the last 30 years.    2) URI:   URI symptoms which started after contact with public at his funeral.  She denies body aches ,  fevers,  and wheezing  symptoms are mild.     3) type 2 DM obesity hypertension ;  using rybelsus ,  has lost 21 lbs since October visit.  Denies low blood sugars, nausea and abd pain.    Outpatient Medications Prior to Visit  Medication Sig Dispense Refill   ALPRAZolam (XANAX) 0.5 MG tablet TAKE ONE TABLET BY MOUTH TWICE DAILY AS NEEDED FOR ANXIETY OR SLEEP 45 tablet 5   buPROPion (WELLBUTRIN XL) 300 MG 24 hr tablet TAKE ONE TABLET (300 MG) BY MOUTH ONCE DAILY 30 tablet 2   Calcium Carb-Cholecalciferol 600-20 MG-MCG TABS Take 1 tablet by mouth daily.     escitalopram (LEXAPRO) 10 MG tablet Take 1 tablet (10 mg total) by mouth daily with breakfast. 90 tablet 1   furosemide (LASIX) 20 MG tablet TAKE 1 TABLET BY MOUTH ONCE DAILY AS NEEDED FOR FLUID  RETENTION 30 tablet 3   gabapentin (NEURONTIN) 300 MG capsule Take 1 capsule (300 mg total) by mouth daily. TAKE 1 CAPSULE BY MOUTH ONCE DAILY 90 capsule 1   losartan (COZAAR) 25 MG tablet Take 1 tablet (25 mg total) by mouth daily. 30 tablet 5   omeprazole (PRILOSEC) 20 MG capsule Take 1 capsule (20 mg total) by mouth daily. 90 capsule 3   Semaglutide (RYBELSUS) 14 MG TABS Take 1 tablet (14 mg total) by mouth daily. 30 tablet 5   tiZANidine (ZANAFLEX) 4 MG tablet Take 1 tablet (4 mg total) by mouth every 6 (six) hours as needed for muscle spasms. 30 tablet 3   traZODone (DESYREL) 50 MG tablet Take 0.5-1 tablets (25-50 mg total) by mouth at bedtime as needed for sleep. 90 tablet 3   atorvastatin (LIPITOR) 40 MG tablet TAKE 1 TABLET BY MOUTH DAILY 90 tablet 3   meloxicam (MOBIC) 7.5 MG tablet Take 1 tablet (7.5 mg total) by mouth daily. 30 tablet 5   Facility-Administered Medications Prior to Visit  Medication Dose Route Frequency Provider Last Rate Last Admin   cyanocobalamin ((VITAMIN B-12)) injection 1,000 mcg  1,000 mcg Intramuscular Q30 days [name], MD   1,000 mcg at 12/13/22 1049    Review of Systems;  Patient denies headache, fevers, malaise, unintentional weight loss, skin rash, eye  pain, sinus congestion and sinus pain, sore throat, dysphagia,  hemoptysis , cough, dyspnea, wheezing, chest pain, palpitations, orthopnea, edema, abdominal pain, nausea, melena, diarrhea, constipation, flank pain, dysuria, hematuria, urinary  Frequency, nocturia, numbness, tingling, seizures,  Focal weakness, Loss of consciousness,  Tremor, insomnia, depression, anxiety, and suicidal ideation.      Objective:  BP 122/80   Pulse 92   Ht 5\' 3"  (1.6 m)   Wt 181 lb (82.1 kg)   SpO2 97%   BMI 32.06 kg/m   BP Readings from Last 3 Encounters:  10/18/23 122/80  07/18/23 102/70  07/12/23 130/84    Wt Readings from Last 3 Encounters:  10/18/23 181 lb (82.1 kg)  07/18/23 202 lb 1.9 oz (91.7 kg)   07/12/23 200 lb (90.7 kg)    Physical Exam Vitals reviewed.  Constitutional:      General: She is not in acute distress.    Appearance: Normal appearance. She is normal weight. She is not ill-appearing, toxic-appearing or diaphoretic.  HENT:     Head: Normocephalic.  Eyes:     General: No scleral icterus.       Right eye: No discharge.        Left eye: No discharge.     Conjunctiva/sclera: Conjunctivae normal.  Cardiovascular:     Rate and Rhythm: Normal rate and regular rhythm.     Heart sounds: Normal heart sounds.  Pulmonary:     Effort: Pulmonary effort is normal. No respiratory distress.     Breath sounds: Normal breath sounds.  Musculoskeletal:        General: Normal range of motion.  Skin:    General: Skin is warm and dry.  Neurological:     General: No focal deficit present.     Mental Status: She is alert and oriented to person, place, and time. Mental status is at baseline.  Psychiatric:        Mood and Affect: Mood normal.        Behavior: Behavior normal.        Thought Content: Thought content normal.        Judgment: Judgment normal.    Lab Results  Component Value Date   HGBA1C 5.5 10/13/2023   HGBA1C 5.5 07/18/2023   HGBA1C 5.6 01/10/2023    Lab Results  Component Value Date   CREATININE 0.84 10/13/2023   CREATININE 0.67 07/28/2023   CREATININE 0.63 07/18/2023    Lab Results  Component Value Date   WBC 6.7 07/18/2023   HGB 13.7 07/18/2023   HCT 42.2 07/18/2023   PLT 316.0 07/18/2023   GLUCOSE 120 (H) 10/13/2023   CHOL 119 10/13/2023   TRIG 78 10/13/2023   HDL 58 10/13/2023   LDLDIRECT 70.0 07/18/2023   LDLCALC 45 10/13/2023   ALT 16 10/13/2023   AST 16 10/13/2023   NA 142 10/13/2023   K 4.1 10/13/2023   CL 109 10/13/2023   CREATININE 0.84 10/13/2023   BUN 14 10/13/2023   CO2 24 10/13/2023   TSH 2.39 07/18/2023   HGBA1C 5.5 10/13/2023   MICROALBUR 2.2 (H) 07/18/2023    MM 3D SCREENING MAMMOGRAM BILATERAL BREAST Result Date:  08/10/2023 CLINICAL DATA:  Screening. EXAM: DIGITAL SCREENING BILATERAL MAMMOGRAM WITH TOMOSYNTHESIS AND CAD TECHNIQUE: Bilateral screening digital craniocaudal and mediolateral oblique mammograms were obtained. Bilateral screening digital breast tomosynthesis was performed. The images were evaluated with computer-aided detection. COMPARISON:  Previous exam(s). ACR Breast Density Category c: The breasts are heterogeneously dense, which may obscure small  masses. FINDINGS: There are no findings suspicious for malignancy. IMPRESSION: No mammographic evidence of malignancy. A result letter of this screening mammogram will be mailed directly to the patient. RECOMMENDATION: Screening mammogram in one year. (Code:SM-B-01Y) BI-RADS CATEGORY  1: Negative. Electronically Signed   By: Baird Lyons M.D.   On: 08/10/2023 13:02    Assessment & Plan:  .Obesity, diabetes, and hypertension syndrome (HCC) Assessment & Plan:  Glycemic control HAS BEEN  Excellent  and she has slost 21 lbs since Ocboer n  and taking  Rybelsus. Not exercising.    Did not tolerate Ozempic due to injection site pain/redness.   Lab Results  Component Value Date   LABMICR 14.9 05/30/2018   MICROALBUR 2.2 (H) 07/18/2023   MICROALBUR 0.9 06/29/2022      Lab Results  Component Value Date   CHOL 119 10/13/2023   HDL 58 10/13/2023   LDLCALC 45 10/13/2023   LDLDIRECT 70.0 07/18/2023   TRIG 78 10/13/2023   CHOLHDL 2.1 10/13/2023         Orders: -     Comprehensive metabolic panel; Future -     Hemoglobin A1c; Future  Hyperlipidemia associated with type 2 diabetes mellitus (HCC) -     Lipid panel; Future -     LDL cholesterol, direct; Future  Microalbuminuric diabetic nephropathy [facility]) Assessment & Plan: Managed with losartan 25 mg daily  Lab Results  Component Value Date   LABMICR 14.9 05/30/2018   MICROALBUR 2.2 (H) 07/18/2023   MICROALBUR 0.9 06/29/2022        Grief Assessment & Plan: Patient is dealing  with the loss of spouse Sheri [name] and has adequate coping skills and emotional support .     Other orders -     Atorvastatin Calcium; Take 1 tablet (40 mg total) by mouth daily.  Dispense: 90 tablet; Refill: 3 -     Meloxicam; Take 1 tablet (7.5 mg total) by mouth daily.  Dispense: 30 tablet; Refill: 5     I provided 30 minutes of face-to-face time during this encounter reviewing patient's last visit with me,  previous  labs and imaging studies, counseling on current state of grief, and post visit ordering to diagnostics and therapeutics .   Follow-up: No follow-ups on file.   [name], MD

## 2023-10-18 NOTE — Patient Instructions (Signed)
I am so sorry for your loss.  I hope you find comfort in the knowledge that you HONORED Sheri [name] in every way,  and he is now with the Lord  Please take care of yourself now .   You have earned this time to rediscover yourself and who YOU ARE AGAIN  Return in July for diabetes follow up.  CPE will be due in late October.

## 2023-12-06 ENCOUNTER — Encounter: Payer: Self-pay | Admitting: Pharmacist

## 2023-12-06 NOTE — Progress Notes (Signed)
 Pharmacy Quality Measure Review  This patient is appearing on a report for being at risk of failing the adherence measure for hypertension (ACEi/ARB) medications this calendar year.   Medication: losartan 25 mg Last fill date: 11/08/23 for 30 day supply  Last prescribed: 07/19/23 30ds (5RF)  PCP f/u scheduled 04/16/24.  Medication refill needed in May Consider 90 day supply for adherence measure - 90 ds covered by [employer]

## 2023-12-30 ENCOUNTER — Other Ambulatory Visit: Payer: Self-pay | Admitting: Internal Medicine

## 2024-01-16 ENCOUNTER — Telehealth: Payer: Self-pay

## 2024-01-16 NOTE — Telephone Encounter (Signed)
 PAP: Patient assistance application for Rybelsus  through [employer] has been mailed to pt's home address on file. Provider portion of application will be faxed to provider's office. Spoke to patient and have consent to e-file re-enrollment portion.

## 2024-01-24 NOTE — Telephone Encounter (Signed)
 PAP: Application for Rybelsus has been submitted to [employer], via fax

## 2024-01-26 NOTE — Telephone Encounter (Signed)
 [employer] sent letter requiring clarity on part D insurance- sent information via fax to [employer].

## 2024-02-02 ENCOUNTER — Telehealth: Payer: Self-pay

## 2024-02-02 MED ORDER — TELMISARTAN 20 MG PO TABS: 20.0000 mg | ORAL_TABLET | Freq: Every day | ORAL | 2 refills | Status: DC

## 2024-02-02 NOTE — Telephone Encounter (Signed)
 Copied from CRM [id number]. Topic: Clinical - Prescription Issue >> Feb 02, 2024 10:14 AM [place] J wrote: Reason for CRM: Patient has been having constipation when she first started her losartan  (COZAAR ) 25 MG tablet. The patient did state that she took a break off of it for a week and felt better, but when starting again the constipation resumed. She would like to see if there was an alternative medication she can be on instead.

## 2024-02-02 NOTE — Addendum Note (Signed)
 Addended by: Thersia Flax on: 02/02/2024 03:04 PM   Modules accepted: Orders

## 2024-02-03 NOTE — Telephone Encounter (Signed)
 Spoke with pt and she stated that she has picked up the medication.

## 2024-02-03 NOTE — Telephone Encounter (Signed)
 PAP: Patient assistance application for Rybelsus  has been approved by PAP Companies: [employer] from 02/14/2024 to 09/19/2024. Medication should be delivered to PAP Delivery: Provider's office. For further shipping updates, please contact [employer] at [phone]. Patient ID is: [id number]

## 2024-02-07 ENCOUNTER — Other Ambulatory Visit: Payer: Self-pay | Admitting: Internal Medicine

## 2024-02-14 ENCOUNTER — Encounter: Payer: Self-pay | Admitting: Pharmacist

## 2024-02-14 NOTE — Progress Notes (Signed)
 Pharmacy Quality Measure Review  This patient is appearing on a report for being at risk of failing the adherence measure for hypertension (ACEi/ARB) medications this calendar year.   Medication: losartan  25mg   Last fill date: 01/03/2024 for 30 day supply  Patient no longer taking losartan  2/2 c/f side effects. Previously documented in chart.  No further action needed at this time.

## 2024-02-27 ENCOUNTER — Telehealth: Payer: Self-pay | Admitting: *Deleted

## 2024-02-27 NOTE — Telephone Encounter (Signed)
 Copied from CRM [id number]. Topic: Clinical - Prescription Issue >> Feb 27, 2024 10:05 AM Emmet Harm C wrote: Reason for CRM: Patient was approved for Semaglutide  (RYBELSUS ) 14 MG TABS on 5/16 and for it to be sent to the office and patient called to see if it has arrived. Medication is still not available at office

## 2024-02-27 NOTE — Telephone Encounter (Signed)
We have not received yet? 

## 2024-02-27 NOTE — Telephone Encounter (Signed)
 Pt is aware. I let pt know that the medication was shipped on 02/14/2024 and to allow 10 to 14 days for arrival. Tomorrow is the 14th day so I lwt pt know that I would let her know if we get it or not. Pt stated that she is down to one pill.

## 2024-02-28 NOTE — Telephone Encounter (Signed)
 We did not receive pt's medication today would it be okay to give her a sample of the Rybelsus  14 mg. Pt will take her last pill today.

## 2024-02-29 NOTE — Telephone Encounter (Signed)
 Medication Samples have been provided to the patient.  Drug name: Rybelsus        Strength: 14 mg        Qty: 1 box  LOT: [id number]  Exp.Date: 08/19/2024  Dosing instructions: Take 1 tablet by mouth daily.   The patient has been instructed regarding the correct time, dose, and frequency of taking this medication, including desired effects and most common side effects.   Deeric Cruise 10:39 AM 02/29/2024

## 2024-02-29 NOTE — Telephone Encounter (Signed)
 Pt is aware that sample is up front for pick up.

## 2024-03-02 ENCOUNTER — Telehealth: Payer: Self-pay

## 2024-03-02 NOTE — Telephone Encounter (Signed)
 Received 4 boxes of Rybelsus  14 mg of Patient assistance medication and called the Patient to let her know that it is ready for pick up.

## 2024-03-17 ENCOUNTER — Other Ambulatory Visit: Payer: Self-pay | Admitting: Internal Medicine

## 2024-03-20 NOTE — Telephone Encounter (Unsigned)
    Copied from CRM [id number]. Topic: Clinical - Prescription Issue >> Mar 20, 2024 10:24 AM Cleave MATSU wrote: Reason for CRM: pt needs her medication sent in that's pending please send these in because she is completely out.

## 2024-03-21 ENCOUNTER — Other Ambulatory Visit: Payer: Self-pay | Admitting: Internal Medicine

## 2024-03-21 NOTE — Telephone Encounter (Signed)
 Refilled: 124/2024 Last OV: 10/18/2023 Next OV: 04/16/2024

## 2024-03-22 ENCOUNTER — Encounter: Payer: Self-pay | Admitting: Internal Medicine

## 2024-03-22 MED ORDER — BUPROPION HCL ER (XL) 300 MG PO TB24: 300.0000 mg | ORAL_TABLET | Freq: Every day | ORAL | 2 refills | Status: DC

## 2024-03-22 NOTE — Telephone Encounter (Signed)
 Duplicate request medication was refilled yesterday. Please refuse request.

## 2024-04-11 DIAGNOSIS — D2272 Melanocytic nevi of left lower limb, including hip: Secondary | ICD-10-CM | POA: Diagnosis not present

## 2024-04-11 DIAGNOSIS — D2262 Melanocytic nevi of left upper limb, including shoulder: Secondary | ICD-10-CM | POA: Diagnosis not present

## 2024-04-11 DIAGNOSIS — L821 Other seborrheic keratosis: Secondary | ICD-10-CM | POA: Diagnosis not present

## 2024-04-11 DIAGNOSIS — D225 Melanocytic nevi of trunk: Secondary | ICD-10-CM | POA: Diagnosis not present

## 2024-04-16 ENCOUNTER — Ambulatory Visit (INDEPENDENT_AMBULATORY_CARE_PROVIDER_SITE_OTHER): Payer: PPO | Admitting: Internal Medicine

## 2024-04-16 ENCOUNTER — Encounter: Payer: Self-pay | Admitting: Internal Medicine

## 2024-04-16 VITALS — BP 110/64 | HR 100 | Temp 98.3°F | Ht 63.0 in | Wt 165.4 lb

## 2024-04-16 DIAGNOSIS — E1169 Type 2 diabetes mellitus with other specified complication: Secondary | ICD-10-CM | POA: Diagnosis not present

## 2024-04-16 DIAGNOSIS — I152 Hypertension secondary to endocrine disorders: Secondary | ICD-10-CM | POA: Diagnosis not present

## 2024-04-16 DIAGNOSIS — E785 Hyperlipidemia, unspecified: Secondary | ICD-10-CM

## 2024-04-16 DIAGNOSIS — F320 Major depressive disorder, single episode, mild: Secondary | ICD-10-CM | POA: Diagnosis not present

## 2024-04-16 DIAGNOSIS — E669 Obesity, unspecified: Secondary | ICD-10-CM | POA: Diagnosis not present

## 2024-04-16 DIAGNOSIS — E1159 Type 2 diabetes mellitus with other circulatory complications: Secondary | ICD-10-CM | POA: Diagnosis not present

## 2024-04-16 DIAGNOSIS — F5104 Psychophysiologic insomnia: Secondary | ICD-10-CM | POA: Diagnosis not present

## 2024-04-16 LAB — LIPID PANEL
Cholesterol: 139 mg/dL (ref 0–200)
HDL: 62.3 mg/dL (ref >39.00)
LDL Cholesterol: 59 mg/dL (ref 0–99)
NonHDL: 76.43
Total CHOL/HDL Ratio: 2
Triglycerides: 86 mg/dL (ref 0.0–149.0)
VLDL: 17.2 mg/dL (ref 0.0–40.0)

## 2024-04-16 LAB — COMPREHENSIVE METABOLIC PANEL WITH GFR
ALT: 32 U/L (ref 0–35)
AST: 22 U/L (ref 0–37)
Albumin: 4.6 g/dL (ref 3.5–5.2)
Alkaline Phosphatase: 66 U/L (ref 39–117)
BUN: 18 mg/dL (ref 6–23)
CO2: 26 meq/L (ref 19–32)
Calcium: 9.2 mg/dL (ref 8.4–10.5)
Chloride: 104 meq/L (ref 96–112)
Creatinine, Ser: 0.6 mg/dL (ref 0.40–1.20)
GFR: 92.47 mL/min (ref >60.00)
Glucose, Bld: 79 mg/dL (ref 70–99)
Potassium: 3.8 meq/L (ref 3.5–5.1)
Sodium: 140 meq/L (ref 135–145)
Total Bilirubin: 1.1 mg/dL (ref 0.2–1.2)
Total Protein: 6.8 g/dL (ref 6.0–8.3)

## 2024-04-16 LAB — MICROALBUMIN / CREATININE URINE RATIO
Creatinine,U: 218.9 mg/dL
Microalb Creat Ratio: 7.7 mg/g (ref 0.0–30.0)
Microalb, Ur: 1.7 mg/dL (ref 0.0–1.9)

## 2024-04-16 LAB — HEMOGLOBIN A1C: Hgb A1c MFr Bld: 5.4 % (ref 4.6–6.5)

## 2024-04-16 LAB — LDL CHOLESTEROL, DIRECT: Direct LDL: 49 mg/dL

## 2024-04-16 MED ORDER — ALPRAZOLAM 0.5 MG PO TABS: 0.5000 mg | ORAL_TABLET | Freq: Every evening | ORAL | 5 refills | Status: AC | PRN

## 2024-04-16 NOTE — Assessment & Plan Note (Signed)
 Previously brought on by by social stressors which have now been removed.  Continues to have occasional insomnia in the form of early wakeups .

## 2024-04-16 NOTE — Progress Notes (Signed)
 Subjective:  Patient ID: Sheri [name], female    DOB: 1955/10/29  Age: 68 y.o. MRN: [id number]  CC: The primary encounter diagnosis was Obesity, diabetes, and hypertension syndrome (HCC). Diagnoses of Hyperlipidemia associated with type 2 diabetes mellitus (HCC), Insomnia, psychophysiological, and Current mild episode of major depressive disorder without prior episode [facility]) were also pertinent to this visit.   HPI Sheri [name] presents for  Chief Complaint  Patient presents with   Medical Management of Chronic Issues    6 month f/u   1) type 2 DM with obesity/HTN: She feels generally well, is walking g several times per week . Not checking her BS.  Taking er medications as directed. Following a carbohydrate modified diet 6 days per week. Denies numbness, burning and tingling of extremities. Appetite is diminished and she has lost 16 lb (still losing)  since January, which  attributes initially to grief ,  but now using  intermittent fasting , low GI diet  and Rybelsus  .  Snacks on [employer]. Managing   constipation with metamucil   Lab Results  Component Value Date   HGBA1C 5.5 10/13/2023    2) grief : resolved .  Her husband Sheri [name] died 6 months ago. She note that she had increased anxiety at night for several months due to being alone at night, but has acclimated.  She is enjoying her freedom and looking forward to traveling more.    Still using alprazolam  just prn insomnia.   3) saw  [facility] ;  chastised about tanning      Outpatient Medications Prior to Visit  Medication Sig Dispense Refill   atorvastatin  (LIPITOR) 40 MG tablet Take 1 tablet (40 mg total) by mouth daily. 90 tablet 3   buPROPion  (WELLBUTRIN  XL) 300 MG 24 hr tablet Take 1 tablet (300 mg total) by mouth daily. 30 tablet 2   Calcium  Carb-Cholecalciferol 600-20 MG-MCG TABS Take 1 tablet by mouth daily.     escitalopram  (LEXAPRO ) 10 MG tablet TAKE ONE TABLET (10 MG) BY MOUTH  ONCE  DAILY WITH BREAKFAST 90 tablet 0   furosemide  (LASIX ) 20 MG tablet TAKE 1 TABLET BY MOUTH ONCE DAILY AS NEEDED FOR FLUID RETENTION 30 tablet 3   meloxicam  (MOBIC ) 7.5 MG tablet Take 1 tablet (7.5 mg total) by mouth daily. 30 tablet 5   omeprazole  (PRILOSEC) 20 MG capsule Take 1 capsule (20 mg total) by mouth daily. 90 capsule 3   Semaglutide  (RYBELSUS ) 14 MG TABS Take 1 tablet (14 mg total) by mouth daily. 30 tablet 5   telmisartan  (MICARDIS ) 20 MG tablet Take 1 tablet (20 mg total) by mouth daily. 30 tablet 2   tiZANidine  (ZANAFLEX ) 4 MG tablet Take 1 tablet (4 mg total) by mouth every 6 (six) hours as needed for muscle spasms. 30 tablet 3   traZODone  (DESYREL ) 50 MG tablet TAKE 1/2 TO 1 TABLET BY MOUTH AT BEDTIMEAS NEEDED FOR SLEEP 90 tablet 1   ALPRAZolam  (XANAX ) 0.5 MG tablet TAKE ONE TABLET BY MOUTH TWICE DAILY AS NEEDED FOR ANXIETY OR SLEEP 45 tablet 0   gabapentin  (NEURONTIN ) 300 MG capsule Take 1 capsule (300 mg total) by mouth daily. TAKE 1 CAPSULE BY MOUTH ONCE DAILY (Patient not taking: Reported on 04/16/2024) 90 capsule 1   Facility-Administered Medications Prior to Visit  Medication Dose Route Frequency Provider Last Rate Last Admin   cyanocobalamin  ((VITAMIN B-12)) injection 1,000 mcg  1,000 mcg Intramuscular Q30 days Marylynn Verneita CROME, MD  1,000 mcg at 12/13/22 1049    Review of Systems;  Patient denies headache, fevers, malaise, unintentional weight loss, skin rash, eye pain, sinus congestion and sinus pain, sore throat, dysphagia,  hemoptysis , cough, dyspnea, wheezing, chest pain, palpitations, orthopnea, edema, abdominal pain, nausea, melena, diarrhea, constipation, flank pain, dysuria, hematuria, urinary  Frequency, nocturia, numbness, tingling, seizures,  Focal weakness, Loss of consciousness,  Tremor, insomnia, depression, anxiety, and suicidal ideation.      Objective:  BP 110/64 (BP Location: Left Arm, Patient Position: Sitting, Cuff Size: Normal)   Pulse 100   Temp  98.3 F (36.8 C) (Oral)   Ht 5' 3 (1.6 m)   Wt 165 lb 6.4 oz (75 kg)   SpO2 98%   BMI 29.30 kg/m   BP Readings from Last 3 Encounters:  04/16/24 110/64  10/18/23 122/80  07/18/23 102/70    Wt Readings from Last 3 Encounters:  04/16/24 165 lb 6.4 oz (75 kg)  10/18/23 181 lb (82.1 kg)  07/18/23 202 lb 1.9 oz (91.7 kg)    Physical Exam Vitals reviewed.  Constitutional:      General: She is not in acute distress.    Appearance: Normal appearance. She is normal weight. She is not ill-appearing, toxic-appearing or diaphoretic.  HENT:     Head: Normocephalic.  Eyes:     General: No scleral icterus.       Right eye: No discharge.        Left eye: No discharge.     Conjunctiva/sclera: Conjunctivae normal.  Cardiovascular:     Rate and Rhythm: Normal rate and regular rhythm.     Heart sounds: Normal heart sounds.  Pulmonary:     Effort: Pulmonary effort is normal. No respiratory distress.     Breath sounds: Normal breath sounds.  Musculoskeletal:        General: Normal range of motion.  Skin:    General: Skin is warm and dry.  Neurological:     General: No focal deficit present.     Mental Status: She is alert and oriented to person, place, and time. Mental status is at baseline.  Psychiatric:        Mood and Affect: Mood normal.        Behavior: Behavior normal.        Thought Content: Thought content normal.        Judgment: Judgment normal.     Lab Results  Component Value Date   HGBA1C 5.5 10/13/2023   HGBA1C 5.5 07/18/2023   HGBA1C 5.6 01/10/2023    Lab Results  Component Value Date   CREATININE 0.84 10/13/2023   CREATININE 0.67 07/28/2023   CREATININE 0.63 07/18/2023    Lab Results  Component Value Date   WBC 6.7 07/18/2023   HGB 13.7 07/18/2023   HCT 42.2 07/18/2023   PLT 316.0 07/18/2023   GLUCOSE 120 (H) 10/13/2023   CHOL 119 10/13/2023   TRIG 78 10/13/2023   HDL 58 10/13/2023   LDLDIRECT 70.0 07/18/2023   LDLCALC 45 10/13/2023   ALT 16  10/13/2023   AST 16 10/13/2023   NA 142 10/13/2023   K 4.1 10/13/2023   CL 109 10/13/2023   CREATININE 0.84 10/13/2023   BUN 14 10/13/2023   CO2 24 10/13/2023   TSH 2.39 07/18/2023   HGBA1C 5.5 10/13/2023    MM 3D SCREENING MAMMOGRAM BILATERAL BREAST Result Date: 08/10/2023 CLINICAL DATA:  Screening. EXAM: DIGITAL SCREENING BILATERAL MAMMOGRAM WITH TOMOSYNTHESIS AND CAD TECHNIQUE: Bilateral screening  digital craniocaudal and mediolateral oblique mammograms were obtained. Bilateral screening digital breast tomosynthesis was performed. The images were evaluated with computer-aided detection. COMPARISON:  Previous exam(s). ACR Breast Density Category c: The breasts are heterogeneously dense, which may obscure small masses. FINDINGS: There are no findings suspicious for malignancy. IMPRESSION: No mammographic evidence of malignancy. A result letter of this screening mammogram will be mailed directly to the patient. RECOMMENDATION: Screening mammogram in one year. (Code:SM-B-01Y) BI-RADS CATEGORY  1: Negative. Electronically Signed   By: Dina  Arceo M.D.   On: 08/10/2023 13:02    Assessment & Plan:  .Obesity, diabetes, and hypertension syndrome (HCC) Assessment & Plan:  Glycemic control HAS BEEN  Excellent  and she has lost a total of 41 lbs since Ocboer 2023 with Rybelsus . Not exercising.    Did not tolerate Ozempic  due to injection site pain/redness. BP is wel controlled on telmisartan  and ldl is at goal with atorvastatin . No changes today  Lab Results  Component Value Date   LABMICR 14.9 05/30/2018      Lab Results  Component Value Date   CHOL 119 10/13/2023   HDL 58 10/13/2023   LDLCALC 45 10/13/2023   LDLDIRECT 70.0 07/18/2023   TRIG 78 10/13/2023   CHOLHDL 2.1 10/13/2023         Orders: -     Microalbumin / creatinine urine ratio -     Hemoglobin A1c -     Comprehensive metabolic panel with GFR  Hyperlipidemia associated with type 2 diabetes mellitus  (HCC) Assessment & Plan: Glycemic control achieve with Rybelsus .  Did not tolerate Ozempic  .Encouraged to resume daily home exercise to maintain aerobic benefits.   LDL is s at goal since increasing atorvastatin   to 40 mg   Lab Results  Component Value Date   HGBA1C 5.5 10/13/2023   Lab Results  Component Value Date   LABMICR 14.9 05/30/2018      Lab Results  Component Value Date   CHOL 119 10/13/2023   HDL 58 10/13/2023   LDLCALC 45 10/13/2023   LDLDIRECT 70.0 07/18/2023   TRIG 78 10/13/2023   CHOLHDL 2.1 10/13/2023         Orders: -     LDL cholesterol, direct -     Lipid panel  Insomnia, psychophysiological Assessment & Plan: Previously brought on by by social stressors which have now been removed.  Continues to have occasional insomnia in the form of early wakeups .    Current mild episode of major depressive disorder without prior episode [facility]) Assessment & Plan: Mood improved with retirement, and resolution of caregiver fatigue since husband and mpther in law have passed away. She is enjoying her freedom.   continue lexapro  and wellbutrin  xl   Other orders -     ALPRAZolam ; Take 1 tablet (0.5 mg total) by mouth at bedtime as needed for anxiety.  Dispense: 30 tablet; Refill: 5     I spent 34 minutes on the day of this face to face encounter reviewing patient's  most recent visit with cardiology,  nephrology,  and neurology,  prior relevant surgical and non surgical procedures, recent  labs and imaging studies, counseling on grief and insomnia  management,  reviewing the assessment and plan with patient, and post visit ordering and reviewing of  diagnostics and therapeutics with patient  .   Follow-up: Return in about 6 months (around 10/17/2024) for physical.   Verneita LITTIE Kettering, MD

## 2024-04-16 NOTE — Assessment & Plan Note (Signed)
 Mood improved with retirement, and resolution of caregiver fatigue since husband and mpther in law have passed away. She is enjoying her freedom.   continue lexapro  and wellbutrin  xl

## 2024-04-16 NOTE — Assessment & Plan Note (Signed)
 Glycemic control achieve with Rybelsus .  Did not tolerate Ozempic  .Encouraged to resume daily home exercise to maintain aerobic benefits.   LDL is s at goal since increasing atorvastatin   to 40 mg   Lab Results  Component Value Date   HGBA1C 5.5 10/13/2023   Lab Results  Component Value Date   LABMICR 14.9 05/30/2018      Lab Results  Component Value Date   CHOL 119 10/13/2023   HDL 58 10/13/2023   LDLCALC 45 10/13/2023   LDLDIRECT 70.0 07/18/2023   TRIG 78 10/13/2023   CHOLHDL 2.1 10/13/2023

## 2024-04-16 NOTE — Patient Instructions (Signed)
 You look fantastic !  I'll plan on seeing yo in 6 months unless your A1c has risen up 7.0  Alprazolam  will be refilled for 6 months

## 2024-04-16 NOTE — Assessment & Plan Note (Signed)
 Glycemic control HAS BEEN  Excellent  and she has lost a total of 41 lbs since [place] 2023 with Rybelsus . Not exercising.    Did not tolerate Ozempic  due to injection site pain/redness. BP is wel controlled on telmisartan  and ldl is at goal with atorvastatin . No changes today  Lab Results  Component Value Date   LABMICR 14.9 05/30/2018      Lab Results  Component Value Date   CHOL 119 10/13/2023   HDL 58 10/13/2023   LDLCALC 45 10/13/2023   LDLDIRECT 70.0 07/18/2023   TRIG 78 10/13/2023   CHOLHDL 2.1 10/13/2023

## 2024-04-17 ENCOUNTER — Ambulatory Visit: Payer: Self-pay | Admitting: Internal Medicine

## 2024-04-19 ENCOUNTER — Encounter: Payer: Self-pay | Admitting: Internal Medicine

## 2024-04-20 MED ORDER — ESCITALOPRAM OXALATE 10 MG PO TABS: 10.0000 mg | ORAL_TABLET | Freq: Every day | ORAL | 1 refills | Status: AC

## 2024-05-25 ENCOUNTER — Encounter: Payer: Self-pay | Admitting: Pharmacist

## 2024-05-25 NOTE — Progress Notes (Signed)
 Pharmacy Quality Measure Review  This patient is appearing on a report for being at risk of failing the adherence measure for hypertension (ACEi/ARB) medications this calendar year.   Medication: telmisartan  20 mg tablet Last fill date: 04/19/24 for 30 day supply  Current Rx is out of refills. Forwarded to Clinical pool.  Next PCP visit 10/18/24.  Future Appointments  Date Time Provider Department Center  07/17/2024  8:50 AM [facility] ANNUAL WELLNESS VISIT [facility] [address]  10/18/2024 10:00 AM Marylynn Verneita CROME, MD [facility] [address]

## 2024-05-26 ENCOUNTER — Other Ambulatory Visit: Payer: Self-pay | Admitting: Internal Medicine

## 2024-05-26 MED ORDER — TELMISARTAN 20 MG PO TABS: 20.0000 mg | ORAL_TABLET | Freq: Every day | ORAL | 1 refills | Status: DC

## 2024-05-28 NOTE — Progress Notes (Signed)
 Noted

## 2024-06-20 ENCOUNTER — Other Ambulatory Visit: Payer: Self-pay | Admitting: Internal Medicine

## 2024-06-25 ENCOUNTER — Other Ambulatory Visit: Payer: Self-pay | Admitting: Internal Medicine

## 2024-06-25 DIAGNOSIS — Z1231 Encounter for screening mammogram for malignant neoplasm of breast: Secondary | ICD-10-CM

## 2024-06-26 ENCOUNTER — Encounter: Payer: Self-pay | Admitting: Pharmacist

## 2024-06-26 ENCOUNTER — Telehealth: Payer: Self-pay

## 2024-06-26 NOTE — Telephone Encounter (Signed)
 Spoke with pt to let her know that we have received her pt assistance medication and it it ready for pick up.    Rybelsus  14 mg: 4 boxes

## 2024-06-28 NOTE — Telephone Encounter (Signed)
Pt picked up today.

## 2024-07-02 DIAGNOSIS — H43811 Vitreous degeneration, right eye: Secondary | ICD-10-CM | POA: Diagnosis not present

## 2024-07-02 DIAGNOSIS — H2513 Age-related nuclear cataract, bilateral: Secondary | ICD-10-CM | POA: Diagnosis not present

## 2024-07-02 DIAGNOSIS — H35411 Lattice degeneration of retina, right eye: Secondary | ICD-10-CM | POA: Diagnosis not present

## 2024-07-02 DIAGNOSIS — H35371 Puckering of macula, right eye: Secondary | ICD-10-CM | POA: Diagnosis not present

## 2024-07-02 LAB — OPHTHALMOLOGY REPORT-SCANNED

## 2024-07-03 ENCOUNTER — Other Ambulatory Visit: Payer: Self-pay | Admitting: Internal Medicine

## 2024-07-17 ENCOUNTER — Ambulatory Visit: Payer: PPO | Admitting: *Deleted

## 2024-07-17 VITALS — Ht 63.0 in | Wt 162.0 lb

## 2024-07-17 DIAGNOSIS — Z0000 Encounter for general adult medical examination without abnormal findings: Secondary | ICD-10-CM | POA: Diagnosis not present

## 2024-07-17 NOTE — Patient Instructions (Addendum)
 Sheri [name],  Thank you for taking the time for your Medicare Wellness Visit. I appreciate your continued commitment to your health goals. Please review the care plan we discussed, and feel free to reach out if I can assist you further.  Medicare recommends these wellness visits once per year to help you and your care team stay ahead of potential health issues. These visits are designed to focus on prevention, allowing your provider to concentrate on managing your acute and chronic conditions during your regular appointments.  Please note that Annual Wellness Visits do not include a physical exam. Some assessments may be limited, especially if the visit was conducted virtually. If needed, we may recommend a separate in-person follow-up with your provider.  Ongoing Care Seeing your primary care provider every 3 to 6 months helps us  monitor your health and provide consistent, personalized care.  Remember to keep your vaccines up to date. Make sure you have your mammogram that is already scheduled.   Referrals If a referral was made during today's visit and you haven't received any updates within two weeks, please contact the referred provider directly to check on the status.  Recommended Screenings:  Health Maintenance  Topic Date Due   Eye exam for diabetics  02/03/2024   Flu Shot  04/20/2024   COVID-19 Vaccine (4 - 2025-26 season) 05/21/2024   Breast Cancer Screening  08/07/2024   Hemoglobin A1C  10/17/2024   Yearly kidney function blood test for diabetes  04/16/2025   Yearly kidney health urinalysis for diabetes  04/16/2025   Complete foot exam   04/16/2025   Medicare Annual Wellness Visit  07/17/2025   Colon Cancer Screening  06/09/2028   DTaP/Tdap/Td vaccine (3 - Td or Tdap) 06/23/2030   Pneumococcal Vaccine for age over 47  Completed   DEXA scan (bone density measurement)  Completed   Hepatitis C Screening  Completed   Zoster (Shingles) Vaccine  Completed   Meningitis B Vaccine   Aged Out       07/17/2024    8:59 AM  Advanced Directives  Does Patient Have a Medical Advance Directive? Yes  Type of Estate Agent of [place];Living will  Copy of Healthcare Power of Attorney in Chart? No - copy requested   Advance Care Planning is important because it: Ensures you receive medical care that aligns with your values, goals, and preferences. Provides guidance to your family and loved ones, reducing the emotional burden of decision-making during critical moments.  Vision: Annual vision screenings are recommended for early detection of glaucoma, cataracts, and diabetic retinopathy. These exams can also reveal signs of chronic conditions such as diabetes and high blood pressure.  Dental: Annual dental screenings help detect early signs of oral cancer, gum disease, and other conditions linked to overall health, including heart disease and diabetes.  Please see the attached documents for additional preventive care recommendations.

## 2024-07-17 NOTE — Progress Notes (Signed)
 Subjective:   Sheri [name] is a 68 y.o. who presents for a Medicare Wellness preventive visit.  As a reminder, Annual Wellness Visits don't include a physical exam, and some assessments may be limited, especially if this visit is performed virtually. We may recommend an in-person follow-up visit with your provider if needed.  Visit Complete: Virtual I connected with  Sheri [name] on 07/17/24 by a audio enabled telemedicine application and verified that I am speaking with the correct person using two identifiers.  Patient Location: Home  Provider Location: Home Office  I discussed the limitations of evaluation and management by telemedicine. The patient expressed understanding and agreed to proceed.  Vital Signs: Because this visit was a virtual/telehealth visit, some criteria may be missing or patient reported. Any vitals not documented were not able to be obtained and vitals that have been documented are patient reported.  VideoDeclined- This patient declined Librarian, academic. Therefore the visit was completed with audio only.  Persons Participating in Visit: Patient.  AWV Questionnaire: No: Patient Medicare AWV questionnaire was not completed prior to this visit.  Cardiac Risk Factors include: advanced age (>81men, >39 women);diabetes mellitus;dyslipidemia;hypertension     Objective:    Today's Vitals   07/17/24 0844  Weight: 162 lb (73.5 kg)  Height: 5' 3 (1.6 m)   Body mass index is 28.7 kg/m.     07/17/2024    8:59 AM 07/12/2023   10:06 AM 07/09/2022    9:59 AM 02/09/2022    9:36 AM  Advanced Directives  Does Patient Have a Medical Advance Directive? Yes Yes Yes No  Type of Estate Agent of [place];Living will Healthcare Power of [place];Living will Healthcare Power of [place];Living will   Does patient want to make changes to medical advance directive?   No - Patient declined   Copy of Healthcare Power  of Attorney in Chart? No - copy requested No - copy requested No - copy requested     Current Medications (verified) Outpatient Encounter Medications as of 07/17/2024  Medication Sig   ALPRAZolam  (XANAX ) 0.5 MG tablet Take 1 tablet (0.5 mg total) by mouth at bedtime as needed for anxiety.   atorvastatin  (LIPITOR) 40 MG tablet Take 1 tablet (40 mg total) by mouth daily.   buPROPion  (WELLBUTRIN  XL) 300 MG 24 hr tablet TAKE 1 TABLET BY MOUTH ONCE DAILY   Calcium  Carb-Cholecalciferol 600-20 MG-MCG TABS Take 1 tablet by mouth daily.   escitalopram  (LEXAPRO ) 10 MG tablet Take 1 tablet (10 mg total) by mouth daily.   furosemide  (LASIX ) 20 MG tablet TAKE 1 TABLET BY MOUTH ONCE DAILY AS NEEDED FOR FLUID RETENTION   meloxicam  (MOBIC ) 7.5 MG tablet Take 1 tablet (7.5 mg total) by mouth daily. (Patient taking differently: Take 7.5 mg by mouth daily as needed.)   omeprazole  (PRILOSEC) 20 MG capsule TAKE 1 CAPSULE (20 MG) BY MOUTH ONCE DAILY   Semaglutide  (RYBELSUS ) 14 MG TABS Take 1 tablet (14 mg total) by mouth daily.   telmisartan  (MICARDIS ) 20 MG tablet Take 1 tablet (20 mg total) by mouth daily.   tiZANidine  (ZANAFLEX ) 4 MG tablet Take 1 tablet (4 mg total) by mouth every 6 (six) hours as needed for muscle spasms.   traZODone  (DESYREL ) 50 MG tablet TAKE 1/2 TO 1 TABLET BY MOUTH AT [facility] NEEDED FOR SLEEP   Facility-Administered Encounter Medications as of 07/17/2024  Medication   cyanocobalamin  ((VITAMIN B-12)) injection 1,000 mcg    Allergies (verified) Patient  has no known allergies.   History: Past Medical History:  Diagnosis Date   Abdominal pain, epigastric    Anxiety    Back pain    Diabetes mellitus, type 2 (HCC)    Diverticulosis of colon without hemorrhage    Duodenitis with hemorrhage    Edema    Fluid retention    GERD (gastroesophageal reflux disease)    Heart murmur    Hyperlipidemia    Hypertension    Leg edema    Mitral regurgitation    Obesity, unspecified     Osteopenia    Panic disorder without agoraphobia    Peripheral neuropathy    Personal history of malignant neoplasm of cervix uteri    CIN-3 1989, normal since, gets pap smears annually   Personal history of other genital system and obstetric disorders(V13.29)    Prediabetes    Rash and other nonspecific skin eruption    Reflux esophagitis    Swallowing difficulty    Unspecified hereditary and idiopathic peripheral neuropathy    Unspecified sleep apnea    sleep study- [facility]   Vitamin D  deficiency    Past Surgical History:  Procedure Laterality Date   ABDOMINAL HYSTERECTOMY     BREAST BIOPSY Left 09/01/2022   Left Breast Stereo Bx ribbon clip , negative   BREAST BIOPSY Left 09/01/2022   MM LT BREAST BX W LOC DEV 1ST LESION IMAGE BX SPEC STEREO GUIDE 09/01/2022 [facility]MO[facility]   BREAST CYST ASPIRATION     right , benign   COLONOSCOPY WITH PROPOFOL  N/A 06/09/2018   Procedure: COLONOSCOPY WITH PROPOFOL ;  Surgeon: Gaylyn Gladis PENNER, MD;  Location: [facility] ENDOSCOPY;  Service: Endoscopy;  Laterality: N/A;   DILATION AND CURETTAGE OF UTERUS     ESOPHAGOGASTRODUODENOSCOPY N/A 06/09/2018   Procedure: ESOPHAGOGASTRODUODENOSCOPY (EGD);  Surgeon: Gaylyn Gladis PENNER, MD;  Location: [facility] ENDOSCOPY;  Service: Endoscopy;  Laterality: N/A;   ESOPHAGOGASTRODUODENOSCOPY (EGD) WITH PROPOFOL  N/A 02/09/2022   Procedure: ESOPHAGOGASTRODUODENOSCOPY (EGD) WITH PROPOFOL ;  Surgeon: Therisa Bi, MD;  Location: [facility] ENDOSCOPY;  Service: Gastroenterology;  Laterality: N/A;   RIGHT OOPHORECTOMY  2001   menopause, endometriosis s/p.  HRT  since- has not tolerated previous attempts to wean   Family History  Problem Relation Age of Onset   Osteopenia Mother    Hypothyroidism Mother    Diabetes Mother    Hyperlipidemia Mother    Hypertension Mother    Obesity Mother    Diabetes Father    Alzheimer's disease Father    Coronary artery disease Father    Obesity Father    Cancer Neg Hx        breast, ovarian,  colon   Breast cancer Neg Hx    Social History   Socioeconomic History   Marital status: Widowed    Spouse name: Billy Bansal   Number of children: 0   Years of education: 14   Highest education level: Associate degree: occupational, scientist, product/process development, or vocational program  Occupational History   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      Update: Spouse passed away on 2023-10-18   Social Drivers of Health   Financial Resource Strain: Low Risk  (07/17/2024)   Overall Financial Resource Strain (CARDIA)    Difficulty of Paying Living Expenses: Not hard at all  Food Insecurity: No Food Insecurity (07/17/2024)   Hunger Vital Sign    Worried About Running Out of Food in the Last Year: Never true    Ran Out of Food in the Last Year: Never true  Transportation Needs: No Transportation Needs (07/17/2024)   PRAPARE - Administrator, Civil Service (Medical): No    Lack of Transportation (Non-Medical): No  Physical Activity: Insufficiently Active (07/17/2024)   Exercise Vital Sign    Days of Exercise per Week: 3 days    Minutes of Exercise per Session: 30 min  Stress: No Stress Concern Present (07/17/2024)   [employer] of Occupational Health - Occupational Stress Questionnaire    Feeling of Stress: Only a little  Social Connections: Socially Isolated (07/17/2024)   Social Connection and Isolation Panel    Frequency of Communication with Friends and Family: More than three times a week    Frequency of Social Gatherings with Friends and Family: More than  three times a week    Attends Religious Services: Never    Database Administrator or Organizations: No    Attends Banker Meetings: Never    Marital Status: Widowed    Tobacco Counseling Counseling given: Not Answered    Clinical Intake:  Pre-visit preparation completed: Yes  Pain : No/denies pain     BMI - recorded: 28.7 Nutritional Status: BMI 25 -29 Overweight Nutritional Risks: None Diabetes: Yes CBG done?: No  Lab Results  Component Value Date   HGBA1C 5.4 04/16/2024   HGBA1C 5.5 10/13/2023   HGBA1C 5.5 07/18/2023     How often do you need to have someone help you when you read instructions, pamphlets, or other written materials from your doctor or pharmacy?: 1 - Never  Interpreter Needed?: No  Information entered by :: R. Xhaiden Coombs LPN   Activities of Daily Living     07/17/2024    8:46 AM  In your present state of health, do you have any difficulty performing the following activities:  Hearing? 0  Vision? 0  Difficulty concentrating or making decisions? 0  Walking or climbing stairs? 0  Dressing or bathing? 0  Doing errands, shopping? 0  Preparing Food and eating ? N  Using the Toilet? N  In the past six months, have you accidently leaked urine? N  Do you have problems with loss of bowel control? N  Managing your Medications? N  Managing your Finances? N  Housekeeping or managing your Housekeeping? N    Patient Care Team: Marylynn Verneita CROME, MD as PCP - General (Internal Medicine)  I have updated your Care Teams any recent Medical Services you may have received from other providers in the past year.     Assessment:   This is a routine wellness examination for Marcelle.  Hearing/Vision screen Hearing Screening - Comments:: No issues Vision Screening - Comments:: Glasses and contacts   Goals Addressed             This Visit's Progress    Patient Stated       Wants to lose about 20 more pounds and get things done around her house  that she needs to be done  Depression Screen     07/17/2024    8:52 AM 04/16/2024   10:34 AM 10/18/2023   10:42 AM 07/18/2023    8:47 AM 07/12/2023   10:01 AM 01/10/2023    9:21 AM 12/16/2022    8:40 AM  PHQ 2/9 Scores  PHQ - 2 Score 0 0 0 0 0 0 0  PHQ- 9 Score 0  2 2 1 1  0    Fall Risk     07/17/2024    8:48 AM 04/16/2024   10:34 AM 10/18/2023   10:41 AM 07/18/2023    8:47 AM 07/12/2023    9:56 AM  Fall Risk   Falls in the past year? 0 1 0 1 1  Number falls in past yr: 0 0 0 0 0  Injury with Fall? 0 0 0 1 1  Risk for fall due to : No Fall Risks History of fall(s) No Fall Risks History of fall(s) History of fall(s);Impaired balance/gait  Risk for fall due to: Comment     riding bike  Follow up Falls evaluation completed;Falls prevention discussed Falls evaluation completed Falls evaluation completed Falls evaluation completed Falls evaluation completed;Falls prevention discussed    MEDICARE RISK AT HOME:  Medicare Risk at Home Any stairs in or around the home?: Yes If so, are there any without handrails?: No Home free of loose throw rugs in walkways, pet beds, electrical cords, etc?: Yes Adequate lighting in your home to reduce risk of falls?: Yes Life alert?: No Use of a cane, walker or w/c?: No Grab bars in the bathroom?: Yes Shower chair or bench in shower?: Yes Elevated toilet seat or a handicapped toilet?: Yes  TIMED UP AND GO:  Was the test performed?  No  Cognitive Function: 6CIT completed        07/17/2024    9:00 AM 07/12/2023   10:07 AM 07/09/2022   10:03 AM 06/29/2021   10:45 AM  6CIT Screen  What Year? 0 points 0 points 0 points 0 points  What month? 0 points 0 points 0 points 0 points  What time? 0 points 0 points 0 points 0 points  Count back from 20 0 points 0 points 0 points 0 points  Months in reverse 0 points 0 points 0 points 0 points  Repeat phrase 0 points 0 points 0 points 0 points  Total Score 0 points 0 points 0 points 0  points    Immunizations Immunization History  Administered Date(s) Administered   Fluad Quad(high Dose 65+) 06/29/2021, 06/29/2022   Fluad Trivalent(High Dose 65+) 07/18/2023   Influenza Split 05/29/2013, 06/03/2014   Influenza-Unspecified 06/27/2015, 06/25/2016, 06/25/2017, 06/25/2018, 06/21/2019   PFIZER(Purple Top)SARS-COV-2 Vaccination 09/11/2019, 10/02/2019, 07/15/2020   PNEUMOCOCCAL CONJUGATE-20 06/29/2021   Pneumococcal Polysaccharide-23 09/23/2014   Td 06/23/2020   Tdap 05/29/2010   Zoster Recombinant(Shingrix) 03/27/2021, 10/08/2021    Screening Tests Health Maintenance  Topic Date Due   OPHTHALMOLOGY EXAM  02/03/2024   Influenza Vaccine  04/20/2024   COVID-19 Vaccine (4 - 2025-26 season) 05/21/2024   Medicare Annual Wellness (AWV)  07/11/2024   Mammogram  08/07/2024   HEMOGLOBIN A1C  10/17/2024   Diabetic kidney evaluation - eGFR measurement  04/16/2025   Diabetic kidney evaluation - Urine ACR  04/16/2025   FOOT EXAM  04/16/2025   Colonoscopy  06/09/2028   DTaP/Tdap/Td (3 - Td or Tdap) 06/23/2030   Pneumococcal Vaccine: 50+ Years  Completed   DEXA SCAN  Completed   Hepatitis C Screening  Completed  Zoster Vaccines- Shingrix  Completed   Meningococcal B Vaccine  Aged Out    Health Maintenance Items Addressed: Patient declines covid vaccine. Patient has an appointment scheduled for mammogram 08/08/24.   Additional Screening:  Vision Screening: Recommended annual ophthalmology exams for early detection of glaucoma and other disorders of the eye. Is the patient up to date with their annual eye exam?  Yes  Who is the provider or what is the name of the office in which the patient attends annual eye exams?  [facility]   Will sent for last office notes  Dental Screening: Recommended annual dental exams for proper oral hygiene  Community Resource Referral / Chronic Care Management: CRR required this visit?  No   CCM required this visit?  No   Plan:     I have personally reviewed and noted the following in the patient's chart:   Medical and social history Use of alcohol, tobacco or illicit drugs  Current medications and supplements including opioid prescriptions. Patient is not currently taking opioid prescriptions. Functional ability and status Nutritional status Physical activity Advanced directives List of other physicians Hospitalizations, surgeries, and ER visits in previous 12 months Vitals Screenings to include cognitive, depression, and falls Referrals and appointments  In addition, I have reviewed and discussed with patient certain preventive protocols, quality metrics, and best practice recommendations. A written personalized care plan for preventive services as well as general preventive health recommendations were provided to patient.   Angeline Fredericks, LPN   [id number]   After Visit Summary: (MyChart) Due to this being a telephonic visit, the after visit summary with patients personalized plan was offered to patient via MyChart   Notes: Nothing significant to report at this time.

## 2024-08-02 DIAGNOSIS — H43813 Vitreous degeneration, bilateral: Secondary | ICD-10-CM | POA: Diagnosis not present

## 2024-08-02 DIAGNOSIS — H35371 Puckering of macula, right eye: Secondary | ICD-10-CM | POA: Diagnosis not present

## 2024-08-02 DIAGNOSIS — H35412 Lattice degeneration of retina, left eye: Secondary | ICD-10-CM | POA: Diagnosis not present

## 2024-08-02 DIAGNOSIS — H35411 Lattice degeneration of retina, right eye: Secondary | ICD-10-CM | POA: Diagnosis not present

## 2024-08-07 ENCOUNTER — Other Ambulatory Visit: Payer: Self-pay | Admitting: Internal Medicine

## 2024-08-08 ENCOUNTER — Ambulatory Visit
Admission: RE | Admit: 2024-08-08 | Discharge: 2024-08-08 | Disposition: A | Discharge status: Home / Self Care | Source: Ambulatory Visit | Attending: Internal Medicine | Admitting: Internal Medicine

## 2024-08-08 DIAGNOSIS — Z1231 Encounter for screening mammogram for malignant neoplasm of breast: Secondary | ICD-10-CM | POA: Diagnosis not present

## 2024-08-28 ENCOUNTER — Other Ambulatory Visit: Payer: Self-pay | Admitting: Internal Medicine

## 2024-08-31 ENCOUNTER — Encounter: Payer: Self-pay | Admitting: Pharmacist

## 2024-08-31 NOTE — Progress Notes (Signed)
 Pharmacy Quality Measure Review  This patient is appearing on a report for being at risk of failing the adherence measure for hypertension (ACEi/ARB) medications this calendar year.   Medication: telmisartan  20 mg Last fill date: 05/26/24 for 90 day supply  Insurance report was not up to date. No action needed at this time.  Medication has been refilled as of 08/28/24 x90 ds.

## 2024-09-19 ENCOUNTER — Telehealth: Admitting: Physician Assistant

## 2024-09-19 DIAGNOSIS — J101 Influenza due to other identified influenza virus with other respiratory manifestations: Secondary | ICD-10-CM | POA: Diagnosis not present

## 2024-09-19 MED ORDER — BENZONATATE 100 MG PO CAPS: 100.0000 mg | ORAL_CAPSULE | Freq: Three times a day (TID) | ORAL | 0 refills | Status: AC | PRN

## 2024-09-19 MED ORDER — OSELTAMIVIR PHOSPHATE 75 MG PO CAPS: 75.0000 mg | ORAL_CAPSULE | Freq: Two times a day (BID) | ORAL | 0 refills | Status: DC

## 2024-09-19 NOTE — Progress Notes (Signed)
 E visit for Flu like symptoms   We are sorry that you are not feeling well.  Here is how we plan to help! Based on what you have shared with me it looks like you may have a respiratory virus that may be influenza.  Influenza or the flu is  an infection caused by a respiratory virus. The flu virus is highly contagious and persons who did not receive their yearly flu vaccination may catch the flu from close contact.  We have anti-viral medications to treat the viruses that cause this infection. They are not a cure and only shorten the course of the infection. These prescriptions are most effective when they are given within the first 2 days of flu symptoms. Antiviral medications are indicated if you have a high risk of complications from the flu. You should  also consider an antiviral medication if you are in close contact with someone who is at risk. These medications can help patients avoid complications from the flu but have side effects that you should know.   Possible side effects from Tamiflu  or oseltamivir  include nausea, vomiting, diarrhea, dizziness, headaches, eye redness, sleep problems or other respiratory symptoms. You should not take Tamiflu  if you have an allergy to oseltamivir  or any to the ingredients in Tamiflu .  Based upon your symptoms and potential risk factors I have prescribed Oseltamivir  (Tamiflu ).  It has been sent to your designated pharmacy.  You will take one 75 mg capsule orally twice a day for the next 5 days.   For nasal congestion, you may use an oral decongestant such as Mucinex D or if you have glaucoma or high blood pressure use plain Mucinex.  Saline nasal spray or nasal drops can help and can safely be used as often as needed for congestion.  If you have a sore or scratchy throat, use a saltwater gargle-  to  teaspoon of salt dissolved in a 4-ounce to 8-ounce glass of warm water .  Gargle the solution for approximately 15-30 seconds and then spit.  It is  important not to swallow the solution.  You can also use throat lozenges/cough drops and Chloraseptic spray to help with throat pain or discomfort.  Warm or cold liquids can also be helpful in relieving throat pain.  For headache, pain or general discomfort, you can use Ibuprofen  or Tylenol  as directed.   Some authorities believe that zinc sprays or the use of Echinacea may shorten the course of your symptoms.  I have prescribed the following medications to help lessen symptoms: I have prescribed Tessalon  Perles 100 mg. You may take 1-2 capsules every 8 hours as needed for cough  You are to isolate at home until you have been fever-free for at least 24 hours without a fever-reducing medication, and symptoms have been steadily improving for 24 hours.  If you must be around other household members who do not have symptoms, you need to make sure that both you and the family members are masking consistently with a high-quality mask.  If you note any worsening of symptoms despite treatment, please seek an in-person evaluation ASAP. If you note any significant shortness of breath or any chest pain, please seek ED evaluation. Please do not delay care!  ANYONE WHO HAS FLU SYMPTOMS SHOULD: Stay home. The flu is highly contagious and going out or to work exposes others! Be sure to drink plenty of fluids. Water  is fine as well as fruit juices, sodas and electrolyte beverages. You may want to stay  away from caffeine  or alcohol. If you are nauseated, try taking small sips of liquids. How do you know if you are getting enough fluid? Your urine should be a pale yellow or almost colorless. Get rest. Taking a steamy shower or using a humidifier may help nasal congestion and ease sore throat pain. Using a saline nasal spray works much the same way. Cough drops, hard candies and sore throat lozenges may ease your cough. Line up a caregiver. Have someone check on you regularly.  GET HELP RIGHT AWAY IF: You cannot  keep down liquids or your medications. You become short of breath Your fell like you are going to pass out or loose consciousness. Your symptoms persist after you have completed your treatment plan  MAKE SURE YOU  Understand these instructions. Will watch your condition. Will get help right away if you are not doing well or get worse.  Your e-visit answers were reviewed by a board certified advanced clinical practitioner to complete your personal care plan.  Depending on the condition, your plan could have included both over the counter or prescription medications.  If there is a problem please reply  once you have received a response from your provider.  Your safety is important to us .  If you have drug allergies check your prescription carefully.    You can use MyChart to ask questions about todays visit, request a non-urgent call back, or ask for a work or school excuse for 24 hours related to this e-Visit. If it has been greater than 24 hours you will need to follow up with your provider, or enter a new e-Visit to address those concerns.  You will get an e-mail in the next two days asking about your experience.  I hope that your e-visit has been valuable and will speed your recovery. Thank you for using e-visits.   I have spent 5 minutes in review of e-visit questionnaire, review and updating patient chart, medical decision making and response to patient.   Delon CHRISTELLA Dickinson, PA-C

## 2024-09-24 ENCOUNTER — Other Ambulatory Visit: Payer: Self-pay | Admitting: Internal Medicine

## 2024-10-03 ENCOUNTER — Other Ambulatory Visit: Payer: Self-pay | Admitting: Internal Medicine

## 2024-10-18 ENCOUNTER — Ambulatory Visit: Admitting: Internal Medicine

## 2024-10-18 ENCOUNTER — Encounter: Payer: Self-pay | Admitting: Internal Medicine

## 2024-10-18 VITALS — BP 112/78 | HR 92 | Temp 97.8°F | Ht 62.0 in | Wt 177.8 lb

## 2024-10-18 DIAGNOSIS — R251 Tremor, unspecified: Secondary | ICD-10-CM | POA: Diagnosis not present

## 2024-10-18 DIAGNOSIS — R6889 Other general symptoms and signs: Secondary | ICD-10-CM

## 2024-10-18 DIAGNOSIS — E669 Obesity, unspecified: Secondary | ICD-10-CM | POA: Diagnosis not present

## 2024-10-18 DIAGNOSIS — E538 Deficiency of other specified B group vitamins: Secondary | ICD-10-CM

## 2024-10-18 DIAGNOSIS — Z0000 Encounter for general adult medical examination without abnormal findings: Secondary | ICD-10-CM

## 2024-10-18 DIAGNOSIS — E559 Vitamin D deficiency, unspecified: Secondary | ICD-10-CM

## 2024-10-18 DIAGNOSIS — E1169 Type 2 diabetes mellitus with other specified complication: Secondary | ICD-10-CM

## 2024-10-18 DIAGNOSIS — I10 Essential (primary) hypertension: Secondary | ICD-10-CM | POA: Diagnosis not present

## 2024-10-18 DIAGNOSIS — E119 Type 2 diabetes mellitus without complications: Secondary | ICD-10-CM

## 2024-10-18 DIAGNOSIS — E785 Hyperlipidemia, unspecified: Secondary | ICD-10-CM

## 2024-10-18 DIAGNOSIS — R61 Generalized hyperhidrosis: Secondary | ICD-10-CM | POA: Diagnosis not present

## 2024-10-18 DIAGNOSIS — F320 Major depressive disorder, single episode, mild: Secondary | ICD-10-CM | POA: Diagnosis not present

## 2024-10-18 DIAGNOSIS — K2270 Barrett's esophagus without dysplasia: Secondary | ICD-10-CM

## 2024-10-18 DIAGNOSIS — Z780 Asymptomatic menopausal state: Secondary | ICD-10-CM

## 2024-10-18 LAB — LIPID PANEL
Cholesterol: 244 mg/dL — ABNORMAL HIGH (ref 28–200)
HDL: 65.3 mg/dL
LDL Cholesterol: 156 mg/dL — ABNORMAL HIGH (ref 10–99)
NonHDL: 178.69
Total CHOL/HDL Ratio: 4
Triglycerides: 113 mg/dL (ref 10.0–149.0)
VLDL: 22.6 mg/dL (ref 0.0–40.0)

## 2024-10-18 LAB — COMPREHENSIVE METABOLIC PANEL WITH GFR
ALT: 17 U/L (ref 3–35)
AST: 16 U/L (ref 5–37)
Albumin: 4.5 g/dL (ref 3.5–5.2)
Alkaline Phosphatase: 54 U/L (ref 39–117)
BUN: 29 mg/dL — ABNORMAL HIGH (ref 6–23)
CO2: 25 meq/L (ref 19–32)
Calcium: 9.5 mg/dL (ref 8.4–10.5)
Chloride: 107 meq/L (ref 96–112)
Creatinine, Ser: 0.59 mg/dL (ref 0.40–1.20)
GFR: 92.52 mL/min
Glucose, Bld: 103 mg/dL — ABNORMAL HIGH (ref 70–99)
Potassium: 4.7 meq/L (ref 3.5–5.1)
Sodium: 137 meq/L (ref 135–145)
Total Bilirubin: 0.8 mg/dL (ref 0.2–1.2)
Total Protein: 6.9 g/dL (ref 6.0–8.3)

## 2024-10-18 LAB — MICROALBUMIN / CREATININE URINE RATIO
Creatinine,U: 116.5 mg/dL
Microalb Creat Ratio: UNDETERMINED mg/g (ref 0.0–30.0)
Microalb, Ur: <0.7 mg/dL

## 2024-10-18 LAB — B12 AND FOLATE PANEL
Folate: 8.2 ng/mL
Vitamin B-12: 267 pg/mL (ref 211–911)

## 2024-10-18 LAB — HEMOGLOBIN A1C: Hgb A1c MFr Bld: 5.3 % (ref 4.6–6.5)

## 2024-10-18 LAB — VITAMIN D 25 HYDROXY (VIT D DEFICIENCY, FRACTURES): VITD: 25.46 ng/mL — ABNORMAL LOW (ref 30.00–100.00)

## 2024-10-18 LAB — TSH: TSH: 1.4 u[IU]/mL (ref 0.35–5.50)

## 2024-10-18 LAB — LDL CHOLESTEROL, DIRECT: Direct LDL: 171 mg/dL

## 2024-10-18 MED ORDER — BUPROPION HCL ER (XL) 150 MG PO TB24: 150.0000 mg | ORAL_TABLET | Freq: Every day | ORAL | 0 refills | Status: AC

## 2024-10-18 NOTE — Patient Instructions (Addendum)
 I highly recommend reading the End of Alzheimer's: The First Program to Prevent and Reverse Cognitive Decline  by Cheryl Cary, MD    You need to be taking a statin and will likely be resuming atorvastatin  pending review of your labs  Referral to Dr Jinny for follow up on Barrett's esophagus (last scope 2023).  Continue daily omeprazole   You have gained  12 lbs since July visit.   If you are not exercising,  START.   You can resume rybelsus  if you can control your constipation with daily magnesium and daily miralax   You insurance does not cover any other muscle relaxer better than it covers tizanidine 

## 2024-10-18 NOTE — Progress Notes (Signed)
 Patient ID: Sheri [name], female    DOB: 11/06/1955  Age: 70 y.o. MRN: [id number]  The patient is here for annual PREVENTIVE examination and management of other chronic and acute problems.   The risk factors are reflected in the social history.  The roster of all physicians providing medical care to patient - is listed in the Snapshot section of the chart.  Activities of daily living:  The patient is 100% independent in all ADLs: dressing, toileting, feeding as well as independent mobility  Home safety : The patient has smoke detectors in the home. They wear seatbelts.  There are no firearms at home. There is no violence in the home.   There is no risks for hepatitis, STDs or HIV. There is no   history of blood transfusion. They have no travel history to infectious disease endemic areas of the world.  The patient has seen their dentist in the last six month. They have seen their eye doctor in the last year. They admit to slight hearing difficulty with regard to whispered voices and some television programs.  They have deferred audiologic testing in the last year.  They do not  have excessive sun exposure. Discussed the need for sun protection: hats, long sleeves and use of sunscreen if there is significant sun exposure.   Diet: the importance of a healthy diet is discussed. They do have a healthy diet.  The benefits of regular aerobic exercise were discussed. She walks 4 times per week ,  20 minutes.   Depression screen: there are no signs or vegative symptoms of depression- irritability, change in appetite, anhedonia, sadness/tearfullness.  Cognitive assessment: the patient manages all their financial and personal affairs and is actively engaged. They could relate day,date,year and events; recalled 2/3 objects at 3 minutes; performed clock-face test normally.  The following portions of the patient's history were reviewed and updated as appropriate: allergies, current medications, past  family history, past medical history,  past surgical history, past social history  and problem list.  Visual acuity was not assessed per patient preference since she has regular follow up with her ophthalmologist. Hearing and body mass index were assessed and reviewed.   During the course of the visit the patient was educated and counseled about appropriate screening and preventive services including : fall prevention , diabetes screening, nutrition counseling, colorectal cancer screening, and recommended immunizations.    CC: The primary encounter diagnosis was Morbid (severe) obesity due to excess calories (HCC). Diagnoses of Hyperlipidemia associated with type 2 diabetes mellitus (HCC), Vitamin D  deficiency, B12 deficiency, Obesity, diabetes, and hypertension syndrome (HCC), Heat intolerance, Unexplained night sweats, Barrett's esophagus without dysplasia, Current mild episode of major depressive disorder without prior episode, Tremor of both hands, Osteopenia after menopause, and Encounter for preventive health examination were also pertinent to this visit.  Had the flu over the christmas. Symptoms resolved  1) New onset intention tremor  AND HEAT INTOLERANCE . taking wellbutrin    2) Diabetes/obesity/HTN:  :  She  feels generally well,  But is not  exercising regularly or trying to lose weight. Checking  blood sugars less than once daily at variable times, usually only if she feels she may be having a hypoglycemic event. .  BS have been under 130 fasting and < 150 post prandially.  Denies any recent hypoglyemic events.  HAS STOPPED ATORVASTATIN  , not due to side effects.  STOPPED  RYBELSUS  DUE TO SEVERE CONSTIPATION WITH NAUSEA despite using stimulant laxatives  3) H/o Barrett's esophagus:  she has reduced therapy to OMEPRAZOLE  20 MG DAILY.  Denies cough, GERD symptoms    History Lavene has a past medical history of Abdominal pain, epigastric, Anxiety, Back pain, Diabetes mellitus, type 2 (HCC),  Diverticulosis of colon without hemorrhage, Duodenitis with hemorrhage, Edema, Fluid retention, GERD (gastroesophageal reflux disease), Heart murmur, Hyperlipidemia, Hypertension, Leg edema, Mitral regurgitation, Obesity, unspecified, Osteopenia, Panic disorder without agoraphobia, Peripheral neuropathy, Personal history of malignant neoplasm of cervix uteri, Personal history of other genital system and obstetric disorders(V13.29), Prediabetes, Rash and other nonspecific skin eruption, Reflux esophagitis, Swallowing difficulty, Unspecified hereditary and idiopathic peripheral neuropathy, Unspecified sleep apnea, and Vitamin D  deficiency.   She has a past surgical history that includes Right oophorectomy (2001); Breast cyst aspiration; Dilation and curettage of uterus; Abdominal hysterectomy; Esophagogastroduodenoscopy (N/A, 06/09/2018); Colonoscopy with propofol  (N/A, 06/09/2018); Esophagogastroduodenoscopy (egd) with propofol  (N/A, 02/09/2022); Breast biopsy (Left, 09/01/2022); and Breast biopsy (Left, 09/01/2022).   Her family history includes Alzheimer's disease in her father; Coronary artery disease in her father; Diabetes in her father and mother; Hyperlipidemia in her mother; Hypertension in her mother; Hypothyroidism in her mother; Obesity in her father and mother; Osteopenia in her mother.She reports that she has never smoked. She has never used smokeless tobacco. She reports current alcohol use. She reports that she does not use drugs.     Review of Systems  Patient denies headache, fevers, malaise, unintentional weight loss, skin rash, eye pain, sinus congestion and sinus pain, sore throat, dysphagia,  hemoptysis , cough, dyspnea, wheezing, chest pain, palpitations, orthopnea, edema, abdominal pain, nausea, melena, diarrhea, constipation, flank pain, dysuria, hematuria, urinary  Frequency, nocturia, numbness, tingling, seizures,  Focal weakness, Loss of consciousness,  insomnia, depression,  anxiety, and suicidal ideation.     Objective:  BP 112/78 (Cuff Size: Normal)   Pulse 92   Temp 97.8 F (36.6 C)   Ht 5' 2 (1.575 m)   Wt 177 lb 12.8 oz (80.6 kg)   SpO2 97%   BMI 32.52 kg/m   Physical Exam Vitals reviewed.  Constitutional:      General: She is not in acute distress.    Appearance: Normal appearance. She is well-developed and normal weight. She is not ill-appearing, toxic-appearing or diaphoretic.  HENT:     Head: Normocephalic.     Right Ear: Tympanic membrane, ear canal and external ear normal. There is no impacted cerumen.     Left Ear: Tympanic membrane, ear canal and external ear normal. There is no impacted cerumen.     Nose: Nose normal.     Mouth/Throat:     Mouth: Mucous membranes are moist.     Pharynx: Oropharynx is clear.  Eyes:     General: No scleral icterus.       Right eye: No discharge.        Left eye: No discharge.     Conjunctiva/sclera: Conjunctivae normal.     Pupils: Pupils are equal, round, and reactive to light.  Neck:     Thyroid : No thyromegaly.     Vascular: No carotid bruit or JVD.  Cardiovascular:     Rate and Rhythm: Normal rate and regular rhythm.     Heart sounds: Normal heart sounds.  Pulmonary:     Effort: Pulmonary effort is normal. No respiratory distress.     Breath sounds: Normal breath sounds.  Chest:  Breasts:    Breasts are symmetrical.     Right: Normal. No swelling, inverted nipple, mass, nipple  discharge, skin change or tenderness.     Left: Normal. No swelling, inverted nipple, mass, nipple discharge, skin change or tenderness.  Abdominal:     General: Bowel sounds are normal.     Palpations: Abdomen is soft. There is no mass.     Tenderness: There is no abdominal tenderness. There is no guarding or rebound.  Musculoskeletal:        General: Normal range of motion.     Cervical back: Normal range of motion and neck supple.  Lymphadenopathy:     Cervical: No cervical adenopathy.     Upper Body:      Right upper body: No supraclavicular, axillary or pectoral adenopathy.     Left upper body: No supraclavicular, axillary or pectoral adenopathy.  Skin:    General: Skin is warm and dry.  Neurological:     General: No focal deficit present.     Mental Status: She is alert and oriented to person, place, and time. Mental status is at baseline.  Psychiatric:        Mood and Affect: Mood normal.        Behavior: Behavior normal.        Thought Content: Thought content normal.        Judgment: Judgment normal.     Assessment & Plan:  Morbid (severe) obesity due to excess calories (HCC)  Hyperlipidemia associated with type 2 diabetes mellitus (HCC) Assessment & Plan: FL not at goal since stopping atorvastatin ,  but A1c fine witout Rybelsus . Resume atorvastatin .   Lab Results  Component Value Date   HGBA1C 5.3 10/18/2024   Lab Results  Component Value Date   LABMICR 14.9 05/30/2018   MICROALBUR <0.7 10/18/2024   MICROALBUR 1.7 04/16/2024      Lab Results  Component Value Date   CHOL 244 (H) 10/18/2024   HDL 65.30 10/18/2024   LDLCALC 156 (H) 10/18/2024   LDLDIRECT 171.0 10/18/2024   TRIG 113.0 10/18/2024   CHOLHDL 4 10/18/2024         Orders: -     Lipid panel -     LDL cholesterol, direct  Vitamin D  deficiency -     VITAMIN D  25 Hydroxy (Vit-D Deficiency, Fractures)  B12 deficiency -     B12 and Folate Panel  Obesity, diabetes, and hypertension syndrome (HCC) Assessment & Plan:  Glycemic control HAS BEEN  Excellent  and she has lost a total of 41 lbs since Ocboer 2023 with Rybelsus  but has gained weight since stopping Rybelsus . id not tolerate Ozempic  due to injection site pain/redness. BP is wel controlled on telmisartan  and ldl is ABOVE GOAL since self discontinuation of atorvastatin .  Advised to resyume atorvastatin  for goal LDL 70  Lab Results  Component Value Date   HGBA1C 5.3 10/18/2024   Lab Results  Component Value Date   CHOL 244 (H) 10/18/2024    HDL 65.30 10/18/2024   LDLCALC 156 (H) 10/18/2024   LDLDIRECT 171.0 10/18/2024   TRIG 113.0 10/18/2024   CHOLHDL 4 10/18/2024     Lab Results  Component Value Date   LABMICR 14.9 05/30/2018   MICROALBUR <0.7 10/18/2024   MICROALBUR 1.7 04/16/2024      Lab Results  Component Value Date   CHOL 244 (H) 10/18/2024   HDL 65.30 10/18/2024   LDLCALC 156 (H) 10/18/2024   LDLDIRECT 171.0 10/18/2024   TRIG 113.0 10/18/2024   CHOLHDL 4 10/18/2024         Orders: -  Hemoglobin A1c -     Microalbumin / creatinine urine ratio -     Comprehensive metabolic panel with GFR  Heat intolerance -     TSH  Unexplained night sweats Assessment & Plan: ETIOLOGY LIKELY MENOPAUSE VS WELLBUTRIN  ADVERSE EFFECT.  Will repeat CXR  Orders: -     DG Chest 2 View; Future  Barrett's esophagus without dysplasia Assessment & Plan: surveillance EGD done May 2023: no dysplasia.  Reviewed last OV note with GI.  Advised to Continue PPI  omeprazole  once daily 20  mg dose   Orders: -     Ambulatory referral to Gastroenterology  Current mild episode of major depressive disorder without prior episode Assessment & Plan: Symptoms have improved following grief from loss of husband , and she is having a tremor.  Wellbutrin  wean outlined.   Tremor of both hands Assessment & Plan: Reassess once wellbutrin  has been stopped.    Osteopenia after menopause Assessment & Plan: WILL REPEAT IN 2027   Encounter for preventive health examination Assessment & Plan: age appropriate education and counseling updated, referrals for preventative services and immunizations addressed, dietary and smoking counseling addressed, most recent labs reviewed.  I have personally reviewed and have noted:   1) the patient's medical and social history 2) The pt's use of alcohol, tobacco, and illicit drugs 3) The patient's current medications and supplements 4) Functional ability including ADL's, fall risk, home  safety risk, hearing and visual impairment 5) Diet and physical activities 6) Evidence for depression or mood disorder 7) The patient's height, weight, and BMI have been recorded in the chartI have made referrals, and provided counseling and education based on review of the above    Other orders -     buPROPion  HCl ER (XL); Take 1 tablet (150 mg total) by mouth daily. For 2 weeks,  then every other day for 2 weeks, then stop  Dispense: 30 tablet; Refill: 0      I provided 40 minutes of  face-to-face time during this encounter reviewing patient's current problems and past surgeries,  recent labs and imaging studies, providing counseling on the above mentioned problems , and coordination  of care .   Follow-up: Return in about 3 months (around 01/16/2025) for follow up diabetes.   Verneita LITTIE Kettering, MD

## 2024-10-18 NOTE — Assessment & Plan Note (Addendum)
 surveillance EGD done May 2023: no dysplasia.  Reviewed last OV note with GI.  Advised to Continue PPI  omeprazole  once daily 20  mg dose

## 2024-10-19 ENCOUNTER — Ambulatory Visit: Payer: Self-pay | Admitting: Internal Medicine

## 2024-10-19 DIAGNOSIS — R61 Generalized hyperhidrosis: Secondary | ICD-10-CM | POA: Insufficient documentation

## 2024-10-19 DIAGNOSIS — R251 Tremor, unspecified: Secondary | ICD-10-CM | POA: Insufficient documentation

## 2024-10-19 NOTE — Assessment & Plan Note (Signed)
 ETIOLOGY LIKELY MENOPAUSE VS WELLBUTRIN  ADVERSE EFFECT.  Will repeat CXR

## 2024-10-19 NOTE — Assessment & Plan Note (Signed)
 Reassess once wellbutrin  has been stopped.

## 2024-10-19 NOTE — Assessment & Plan Note (Signed)
 FL not at goal since stopping atorvastatin ,  but A1c fine witout Rybelsus . Resume atorvastatin .   Lab Results  Component Value Date   HGBA1C 5.3 10/18/2024   Lab Results  Component Value Date   LABMICR 14.9 05/30/2018   MICROALBUR <0.7 10/18/2024   MICROALBUR 1.7 04/16/2024      Lab Results  Component Value Date   CHOL 244 (H) 10/18/2024   HDL 65.30 10/18/2024   LDLCALC 156 (H) 10/18/2024   LDLDIRECT 171.0 10/18/2024   TRIG 113.0 10/18/2024   CHOLHDL 4 10/18/2024

## 2024-10-19 NOTE — Assessment & Plan Note (Signed)

## 2024-10-19 NOTE — Assessment & Plan Note (Signed)
 Symptoms have improved following grief from loss of husband , and she is having a tremor.  Wellbutrin  wean outlined.

## 2024-10-19 NOTE — Assessment & Plan Note (Signed)
 WILL REPEAT IN 2027

## 2025-01-16 ENCOUNTER — Ambulatory Visit: Admitting: Internal Medicine

## 2025-07-22 ENCOUNTER — Ambulatory Visit
